# Patient Record
Sex: Male | Born: 1975 | Race: Black or African American | Hispanic: Yes | Marital: Single | Smoking: Former smoker
Health system: Southern US, Community
[De-identification: ages and names within clinical notes are randomized; demographics above are authoritative.]

## PROBLEM LIST (undated history)

## (undated) DIAGNOSIS — I10 Essential (primary) hypertension: Secondary | ICD-10-CM

## (undated) DIAGNOSIS — J189 Pneumonia, unspecified organism: Secondary | ICD-10-CM

## (undated) DIAGNOSIS — I509 Heart failure, unspecified: Secondary | ICD-10-CM

## (undated) DIAGNOSIS — G4730 Sleep apnea, unspecified: Secondary | ICD-10-CM

---

## 2013-07-28 ENCOUNTER — Ambulatory Visit (INDEPENDENT_AMBULATORY_CARE_PROVIDER_SITE_OTHER): Payer: BLUE CROSS/BLUE SHIELD | Admitting: Family Medicine

## 2013-07-28 VITALS — BP 132/88 | HR 67 | Temp 97.8°F | Resp 16 | Ht 69.0 in | Wt 236.0 lb

## 2013-07-28 DIAGNOSIS — M545 Low back pain, unspecified: Secondary | ICD-10-CM

## 2013-07-28 MED ORDER — PREDNISONE 10 MG PO TABS: ORAL_TABLET | ORAL | Status: DC

## 2013-07-28 MED ORDER — TRAMADOL HCL 50 MG PO TABS: 50.0000 mg | ORAL_TABLET | Freq: Three times a day (TID) | ORAL | Status: DC | PRN

## 2013-07-28 MED ORDER — CARISOPRODOL 350 MG PO TABS: 350.0000 mg | ORAL_TABLET | Freq: Four times a day (QID) | ORAL | Status: DC | PRN

## 2013-07-28 NOTE — Progress Notes (Signed)
This chart was scribed for Mark SorensonEva Shaw MD by Tana ConchStephen Methvin, ED Scribe. This patient was seen in room 01 and the patient's care was started at 2:45 PM .  Subjective:    Patient ID: Mark Hayden, male    DOB: 1976/02/21, 38 y.o.   MRN: [id number]    Chief Complaint  Patient presents with   Back Pain    x 3 days ago pt was lifting boxes and hurt back    HPI  HPI Comments: Mark Hayden is a 38 y.o. male with h/o lower back pain who presents to the [facility] complaining of lower back pain that began Saturday morning while he was carrying some boxes and that the pain is more on the left. He states that the pain does not radiate and that it prevents him from standing straight. He reports that he cannot find a comfortable position, his back hurts when he sits, stands, and when he lays down. He reports that the pain in his back prevent him from his normal activities and getting up from a seated position. He denies any weakness, numbness, chills, and incontinence. Pt states that NSAIDS and tylenol provide no effect.  Pt has had similar symptoms before and has been to physical therapy for his back in the past, the exercises he learned from physical therapy have not provided effect with this most current episode of back pain.   He reports not having lower back XRAYS before.    History reviewed. No pertinent past medical history.    No current outpatient prescriptions on file prior to visit.   No current facility-administered medications on file prior to visit.      No Known Allergies      Review of Systems  Constitutional: Positive for activity change.  HENT: Negative for nosebleeds, postnasal drip, rhinorrhea and sinus pressure.   Cardiovascular: Negative for chest pain.  Gastrointestinal: Negative for abdominal pain.  Genitourinary: Negative for dysuria, decreased urine volume and difficulty urinating.  Musculoskeletal: Positive for  arthralgias, back pain and gait problem. Negative for joint swelling.  Psychiatric/Behavioral: Negative for confusion.       Objective:   Physical Exam  Nursing note and vitals reviewed. Constitutional: He is oriented to person, place, and time. He appears well-developed and well-nourished.  HENT:  Head: Normocephalic and atraumatic.  Eyes: Conjunctivae and EOM are normal. No scleral icterus.  Neck: Normal range of motion. Neck supple. No thyromegaly present.  Cardiovascular: Normal rate and regular rhythm.  Exam reveals no gallop and no friction rub.   No murmur heard. Pulmonary/Chest: Effort normal. No stridor. He has no wheezes. He has no rales. He exhibits no tenderness.  Abdominal: He exhibits no distension. There is no tenderness. There is no rebound.  Musculoskeletal: Normal range of motion. He exhibits no edema.  Pain over L-5, left SI joint, lower lumbar, and upper sacrum  Forward flexion about 40 degrees, no extension    Lymphadenopathy:    He has no cervical adenopathy.  Neurological: He is oriented to person, place, and time. He exhibits normal muscle tone. Coordination normal.  2+ bilaterally on patellar and achilles  Negative straight leg raise bilaterally  Hip flexors 4/5 bilaterally due to pain   All over lower extremities test 5/5 bilaterally   Skin: No rash noted. No erythema.  Psychiatric: He has a normal mood and affect. His behavior is normal.    Filed Vitals:   07/28/13 1427  BP: 132/88  Pulse: 67  Temp: 97.8 F (36.6 C)  Resp: 16         Assessment & Plan:    3:02 PM-Discussed treatment plan which includes rest, pain medication, muscle relaxers, a 3 day work note, and for the pt to come back in a few days if the pain does not improve with pt at bedside and pt agreed to plan.  Lumbago  Meds ordered this encounter  Medications   DISCONTD: cyclobenzaprine (FLEXERIL) 5 MG tablet    Sig: Take 5 mg by mouth at bedtime as needed for muscle  spasms.   predniSONE (DELTASONE) 10 MG tablet    Sig: 6-5-4-3-2-1 po tabs qd x 6d taper    Dispense:  21 tablet    Refill:  0   carisoprodol (SOMA) 350 MG tablet    Sig: Take 1 tablet (350 mg total) by mouth 4 (four) times daily as needed for muscle spasms.    Dispense:  30 tablet    Refill:  0   traMADol (ULTRAM) 50 MG tablet    Sig: Take 1 tablet (50 mg total) by mouth every 8 (eight) hours as needed for severe pain.    Dispense:  20 tablet    Refill:  0    I personally performed the services described in this documentation, which was scribed in my presence. The recorded information has been reviewed and considered, and addended by me as needed.  Mark SorensonEva Shaw, MD MPH

## 2013-07-28 NOTE — Patient Instructions (Addendum)
Start taking ibuprofen AFTER the prednisone is complete. I recommend keeping ibuprofen on board - esp taking before and during work. Then when you come home, take a muscle relaxant followed by 15 minutes of heat followed by gentle stretching. Try to do this regiment 2 - 3 times a day.  If you are still having pain in 4 to 6 wks, come back to clinic for further eval. RTC immed if symptoms worsen or you develop any other concerning symptoms below.  Low Back Strain with Rehab A strain is an injury in which a tendon or muscle is torn. The muscles and tendons of the lower back are vulnerable to strains. However, these muscles and tendons are very strong and require a great force to be injured. Strains are classified into three categories. Grade 1 strains cause pain, but the tendon is not lengthened. Grade 2 strains include a lengthened ligament, due to the ligament being stretched or partially ruptured. With grade 2 strains there is still function, although the function may be decreased. Grade 3 strains involve a complete tear of the tendon or muscle, and function is usually impaired. SYMPTOMS   Pain in the lower back.  Pain that affects one side more than the other.  Pain that gets worse with movement and may be felt in the hip, buttocks, or back of the thigh.  Muscle spasms of the muscles in the back.  Swelling along the muscles of the back.  Loss of strength of the back muscles.  Crackling sound (crepitation) when the muscles are touched. CAUSES  Lower back strains occur when a force is placed on the muscles or tendons that is greater than they can handle. Common causes of injury include:  Prolonged overuse of the muscle-tendon units in the lower back, usually from incorrect posture.  A single violent injury or force applied to the back. RISK INCREASES WITH:  Sports that involve twisting forces on the spine or a lot of bending at the waist (football, rugby, weightlifting, bowling, golf,  tennis, speed skating, racquetball, swimming, running, gymnastics, diving).  Poor strength and flexibility.  Failure to warm up properly before activity.  Family history of lower back pain or disk disorders.  Previous back injury or surgery (especially fusion).  Poor posture with lifting, especially heavy objects.  Prolonged sitting, especially with poor posture. PREVENTION   Learn and use proper posture when sitting or lifting (maintain proper posture when sitting, lift using the knees and legs, not at the waist).  Warm up and stretch properly before activity.  Allow for adequate recovery between workouts.  Maintain physical fitness:  Strength, flexibility, and endurance.  Cardiovascular fitness. PROGNOSIS  If treated properly, lower back strains usually heal within 6 weeks. RELATED COMPLICATIONS   Recurring symptoms, resulting in a chronic problem.  Chronic inflammation, scarring, and partial muscle-tendon tear.  Delayed healing or resolution of symptoms.  Prolonged disability. TREATMENT  Treatment first involves the use of ice and medicine, to reduce pain and inflammation. The use of strengthening and stretching exercises may help reduce pain with activity. These exercises may be performed at home or with a therapist. Severe injuries may require referral to a therapist for further evaluation and treatment, such as ultrasound. Your caregiver may advise that you wear a back brace or corset, to help reduce pain and discomfort. Often, prolonged bed rest results in greater harm then benefit. Corticosteroid injections may be recommended. However, these should be reserved for the most serious cases. It is important to avoid  using your back when lifting objects. At night, sleep on your back on a firm mattress with a pillow placed under your knees. If non-surgical treatment is unsuccessful, surgery may be needed.  MEDICATION   If pain medicine is needed, nonsteroidal  anti-inflammatory medicines (aspirin and ibuprofen), or other minor pain relievers (acetaminophen), are often advised.  Do not take pain medicine for 7 days before surgery.  Prescription pain relievers may be given, if your caregiver thinks they are needed. Use only as directed and only as much as you need.  Ointments applied to the skin may be helpful.  Corticosteroid injections may be given by your caregiver. These injections should be reserved for the most serious cases, because they may only be given a certain number of times. HEAT AND COLD  Cold treatment (icing) should be applied for 10 to 15 minutes every 2 to 3 hours for inflammation and pain, and immediately after activity that aggravates your symptoms. Use ice packs or an ice massage.  Heat treatment may be used before performing stretching and strengthening activities prescribed by your caregiver, physical therapist, or athletic trainer. Use a heat pack or a warm water soak. SEEK MEDICAL CARE IF:   Symptoms get worse or do not improve in 2 to 4 weeks, despite treatment.  You develop numbness, weakness, or loss of bowel or bladder function.  New, unexplained symptoms develop. (Drugs used in treatment may produce side effects.) EXERCISES  RANGE OF MOTION (ROM) AND STRETCHING EXERCISES - Low Back Strain Most people with lower back pain will find that their symptoms get worse with excessive bending forward (flexion) or arching at the lower back (extension). The exercises which will help resolve your symptoms will focus on the opposite motion.  Your physician, physical therapist or athletic trainer will help you determine which exercises will be most helpful to resolve your lower back pain. Do not complete any exercises without first consulting with your caregiver. Discontinue any exercises which make your symptoms worse until you speak to your caregiver.  If you have pain, numbness or tingling which travels down into your buttocks,  leg or foot, the goal of the therapy is for these symptoms to move closer to your back and eventually resolve. Sometimes, these leg symptoms will get better, but your lower back pain may worsen. This is typically an indication of progress in your rehabilitation. Be very alert to any changes in your symptoms and the activities in which you participated in the 24 hours prior to the change. Sharing this information with your caregiver will allow him/her to most efficiently treat your condition.  These exercises may help you when beginning to rehabilitate your injury. Your symptoms may resolve with or without further involvement from your physician, physical therapist or athletic trainer. While completing these exercises, remember:  Restoring tissue flexibility helps normal motion to return to the joints. This allows healthier, less painful movement and activity.  An effective stretch should be held for at least 30 seconds.  A stretch should never be painful. You should only feel a gentle lengthening or release in the stretched tissue. FLEXION RANGE OF MOTION AND STRETCHING EXERCISES: STRETCH  Flexion, Single Knee to Chest   Lie on a firm bed or floor with both legs extended in front of you.  Keeping one leg in contact with the floor, bring your opposite knee to your chest. Hold your leg in place by either grabbing behind your thigh or at your knee.  Pull until you feel a  gentle stretch in your lower back. Hold __________ seconds.  Slowly release your grasp and repeat the exercise with the opposite side. Repeat __________ times. Complete this exercise __________ times per day.  STRETCH  Flexion, Double Knee to Chest   Lie on a firm bed or floor with both legs extended in front of you.  Keeping one leg in contact with the floor, bring your opposite knee to your chest.  Tense your stomach muscles to support your back and then lift your other knee to your chest. Hold your legs in place by either  grabbing behind your thighs or at your knees.  Pull both knees toward your chest until you feel a gentle stretch in your lower back. Hold __________ seconds.  Tense your stomach muscles and slowly return one leg at a time to the floor. Repeat __________ times. Complete this exercise __________ times per day.  STRETCH  Low Trunk Rotation  Lie on a firm bed or floor. Keeping your legs in front of you, bend your knees so they are both pointed toward the ceiling and your feet are flat on the floor.  Extend your arms out to the side. This will stabilize your upper body by keeping your shoulders in contact with the floor.  Gently and slowly drop both knees together to one side until you feel a gentle stretch in your lower back. Hold for __________ seconds.  Tense your stomach muscles to support your lower back as you bring your knees back to the starting position. Repeat the exercise to the other side. Repeat __________ times. Complete this exercise __________ times per day  EXTENSION RANGE OF MOTION AND FLEXIBILITY EXERCISES: STRETCH  Extension, Prone on Elbows   Lie on your stomach on the floor, a bed will be too soft. Place your palms about shoulder width apart and at the height of your head.  Place your elbows under your shoulders. If this is too painful, stack pillows under your chest.  Allow your body to relax so that your hips drop lower and make contact more completely with the floor.  Hold this position for __________ seconds.  Slowly return to lying flat on the floor. Repeat __________ times. Complete this exercise __________ times per day.  RANGE OF MOTION  Extension, Prone Press Ups  Lie on your stomach on the floor, a bed will be too soft. Place your palms about shoulder width apart and at the height of your head.  Keeping your back as relaxed as possible, slowly straighten your elbows while keeping your hips on the floor. You may adjust the placement of your hands to maximize  your comfort. As you gain motion, your hands will come more underneath your shoulders.  Hold this position __________ seconds.  Slowly return to lying flat on the floor. Repeat __________ times. Complete this exercise __________ times per day.  RANGE OF MOTION- Quadruped, Neutral Spine   Assume a hands and knees position on a firm surface. Keep your hands under your shoulders and your knees under your hips. You may place padding under your knees for comfort.  Drop your head and point your tail bone toward the ground below you. This will round out your lower back like an angry cat. Hold this position for __________ seconds.  Slowly lift your head and release your tail bone so that your back sags into a large arch, like an old horse.  Hold this position for __________ seconds.  Repeat this until you feel limber in your  lower back.  Now, find your "sweet spot." This will be the most comfortable position somewhere between the two previous positions. This is your neutral spine. Once you have found this position, tense your stomach muscles to support your lower back.  Hold this position for __________ seconds. Repeat __________ times. Complete this exercise __________ times per day.  STRENGTHENING EXERCISES - Low Back Strain These exercises may help you when beginning to rehabilitate your injury. These exercises should be done near your "sweet spot." This is the neutral, low-back arch, somewhere between fully rounded and fully arched, that is your least painful position. When performed in this safe range of motion, these exercises can be used for people who have either a flexion or extension based injury. These exercises may resolve your symptoms with or without further involvement from your physician, physical therapist or athletic trainer. While completing these exercises, remember:   Muscles can gain both the endurance and the strength needed for everyday activities through controlled  exercises.  Complete these exercises as instructed by your physician, physical therapist or athletic trainer. Increase the resistance and repetitions only as guided.  You may experience muscle soreness or fatigue, but the pain or discomfort you are trying to eliminate should never worsen during these exercises. If this pain does worsen, stop and make certain you are following the directions exactly. If the pain is still present after adjustments, discontinue the exercise until you can discuss the trouble with your caregiver. STRENGTHENING Deep Abdominals, Pelvic Tilt  Lie on a firm bed or floor. Keeping your legs in front of you, bend your knees so they are both pointed toward the ceiling and your feet are flat on the floor.  Tense your lower abdominal muscles to press your lower back into the floor. This motion will rotate your pelvis so that your tail bone is scooping upwards rather than pointing at your feet or into the floor.  With a gentle tension and even breathing, hold this position for __________ seconds. Repeat __________ times. Complete this exercise __________ times per day.  STRENGTHENING  Abdominals, Crunches   Lie on a firm bed or floor. Keeping your legs in front of you, bend your knees so they are both pointed toward the ceiling and your feet are flat on the floor. Cross your arms over your chest.  Slightly tip your chin down without bending your neck.  Tense your abdominals and slowly lift your trunk high enough to just clear your shoulder blades. Lifting higher can put excessive stress on the lower back and does not further strengthen your abdominal muscles.  Control your return to the starting position. Repeat __________ times. Complete this exercise __________ times per day.  STRENGTHENING  Quadruped, Opposite UE/LE Lift   Assume a hands and knees position on a firm surface. Keep your hands under your shoulders and your knees under your hips. You may place padding under  your knees for comfort.  Find your neutral spine and gently tense your abdominal muscles so that you can maintain this position. Your shoulders and hips should form a rectangle that is parallel with the floor and is not twisted.  Keeping your trunk steady, lift your right hand no higher than your shoulder and then your left leg no higher than your hip. Make sure you are not holding your breath. Hold this position __________ seconds.  Continuing to keep your abdominal muscles tense and your back steady, slowly return to your starting position. Repeat with the opposite arm and  leg. Repeat __________ times. Complete this exercise __________ times per day.  STRENGTHENING  Lower Abdominals, Double Knee Lift  Lie on a firm bed or floor. Keeping your legs in front of you, bend your knees so they are both pointed toward the ceiling and your feet are flat on the floor.  Tense your abdominal muscles to brace your lower back and slowly lift both of your knees until they come over your hips. Be certain not to hold your breath.  Hold __________ seconds. Using your abdominal muscles, return to the starting position in a slow and controlled manner. Repeat __________ times. Complete this exercise __________ times per day.  POSTURE AND BODY MECHANICS CONSIDERATIONS - Low Back Strain Keeping correct posture when sitting, standing or completing your activities will reduce the stress put on different body tissues, allowing injured tissues a chance to heal and limiting painful experiences. The following are general guidelines for improved posture. Your physician or physical therapist will provide you with any instructions specific to your needs. While reading these guidelines, remember:  The exercises prescribed by your provider will help you have the flexibility and strength to maintain correct postures.  The correct posture provides the best environment for your joints to work. All of your joints have less wear  and tear when properly supported by a spine with good posture. This means you will experience a healthier, less painful body.  Correct posture must be practiced with all of your activities, especially prolonged sitting and standing. Correct posture is as important when doing repetitive low-stress activities (typing) as it is when doing a single heavy-load activity (lifting). RESTING POSITIONS Consider which positions are most painful for you when choosing a resting position. If you have pain with flexion-based activities (sitting, bending, stooping, squatting), choose a position that allows you to rest in a less flexed posture. You would want to avoid curling into a fetal position on your side. If your pain worsens with extension-based activities (prolonged standing, working overhead), avoid resting in an extended position such as sleeping on your stomach. Most people will find more comfort when they rest with their spine in a more neutral position, neither too rounded nor too arched. Lying on a non-sagging bed on your side with a pillow between your knees, or on your back with a pillow under your knees will often provide some relief. Keep in mind, being in any one position for a prolonged period of time, no matter how correct your posture, can still lead to stiffness. PROPER SITTING POSTURE In order to minimize stress and discomfort on your spine, you must sit with correct posture. Sitting with good posture should be effortless for a healthy body. Returning to good posture is a gradual process. Many people can work toward this most comfortably by using various supports until they have the flexibility and strength to maintain this posture on their own. When sitting with proper posture, your ears will fall over your shoulders and your shoulders will fall over your hips. You should use the back of the chair to support your upper back. Your lower back will be in a neutral position, just slightly arched. You may  place a small pillow or folded towel at the base of your lower back for support.  When working at a desk, create an environment that supports good, upright posture. Without extra support, muscles tire, which leads to excessive strain on joints and other tissues. Keep these recommendations in mind: CHAIR:  A chair should be able to  slide under your desk when your back makes contact with the back of the chair. This allows you to work closely.  The chair's height should allow your eyes to be level with the upper part of your monitor and your hands to be slightly lower than your elbows. BODY POSITION  Your feet should make contact with the floor. If this is not possible, use a foot rest.  Keep your ears over your shoulders. This will reduce stress on your neck and lower back. INCORRECT SITTING POSTURES  If you are feeling tired and unable to assume a healthy sitting posture, do not slouch or slump. This puts excessive strain on your back tissues, causing more damage and pain. Healthier options include:  Using more support, like a lumbar pillow.  Switching tasks to something that requires you to be upright or walking.  Talking a brief walk.  Lying down to rest in a neutral-spine position. PROLONGED STANDING WHILE SLIGHTLY LEANING FORWARD  When completing a task that requires you to lean forward while standing in one place for a long time, place either foot up on a stationary 2-4 inch high object to help maintain the best posture. When both feet are on the ground, the lower back tends to lose its slight inward curve. If this curve flattens (or becomes too large), then the back and your other joints will experience too much stress, tire more quickly, and can cause pain. CORRECT STANDING POSTURES Proper standing posture should be assumed with all daily activities, even if they only take a few moments, like when brushing your teeth. As in sitting, your ears should fall over your shoulders and your  shoulders should fall over your hips. You should keep a slight tension in your abdominal muscles to brace your spine. Your tailbone should point down to the ground, not behind your body, resulting in an over-extended swayback posture.  INCORRECT STANDING POSTURES  Common incorrect standing postures include a forward head, locked knees and/or an excessive swayback. WALKING Walk with an upright posture. Your ears, shoulders and hips should all line-up. PROLONGED ACTIVITY IN A FLEXED POSITION When completing a task that requires you to bend forward at your waist or lean over a low surface, try to find a way to stabilize 3 out of 4 of your limbs. You can place a hand or elbow on your thigh or rest a knee on the surface you are reaching across. This will provide you more stability so that your muscles do not fatigue as quickly. By keeping your knees relaxed, or slightly bent, you will also reduce stress across your lower back. CORRECT LIFTING TECHNIQUES DO :   Assume a wide stance. This will provide you more stability and the opportunity to get as close as possible to the object which you are lifting.  Tense your abdominals to brace your spine. Bend at the knees and hips. Keeping your back locked in a neutral-spine position, lift using your leg muscles. Lift with your legs, keeping your back straight.  Test the weight of unknown objects before attempting to lift them.  Try to keep your elbows locked down at your sides in order get the best strength from your shoulders when carrying an object.  Always ask for help when lifting heavy or awkward objects. INCORRECT LIFTING TECHNIQUES DO NOT:   Lock your knees when lifting, even if it is a small object.  Bend and twist. Pivot at your feet or move your feet when needing to change directions.  Assume that you can safely pick up even a paper clip without proper posture. Document Released: 03/05/2005 Document Revised: 05/28/2011 Document Reviewed:  06/17/2008 [facility] Patient Information 2014 [place], [place].

## 2013-09-22 ENCOUNTER — Emergency Department (HOSPITAL_COMMUNITY): Payer: BC Managed Care – PPO

## 2013-09-22 ENCOUNTER — Encounter (HOSPITAL_COMMUNITY): Payer: Self-pay | Admitting: Emergency Medicine

## 2013-09-22 ENCOUNTER — Emergency Department (HOSPITAL_COMMUNITY)
Admission: EM | Admit: 2013-09-22 | Discharge: 2013-09-22 | Disposition: A | Discharge status: Home / Self Care | Payer: BC Managed Care – PPO | Attending: Emergency Medicine | Admitting: Emergency Medicine

## 2013-09-22 DIAGNOSIS — R0789 Other chest pain: Secondary | ICD-10-CM

## 2013-09-22 DIAGNOSIS — R42 Dizziness and giddiness: Secondary | ICD-10-CM | POA: Insufficient documentation

## 2013-09-22 DIAGNOSIS — R209 Unspecified disturbances of skin sensation: Secondary | ICD-10-CM | POA: Insufficient documentation

## 2013-09-22 DIAGNOSIS — R071 Chest pain on breathing: Secondary | ICD-10-CM | POA: Insufficient documentation

## 2013-09-22 DIAGNOSIS — Z87891 Personal history of nicotine dependence: Secondary | ICD-10-CM | POA: Insufficient documentation

## 2013-09-22 LAB — I-STAT TROPONIN, ED: Troponin i, poc: 0 ng/mL (ref 0.00–0.08)

## 2013-09-22 LAB — CBC
HCT: 39.6 % (ref 39.0–52.0)
Hemoglobin: 13.5 g/dL (ref 13.0–17.0)
MCH: 31.2 pg (ref 26.0–34.0)
MCHC: 34.1 g/dL (ref 30.0–36.0)
MCV: 91.5 fL (ref 78.0–100.0)
Platelets: 246 10*3/uL (ref 150–400)
RBC: 4.33 MIL/uL (ref 4.22–5.81)
RDW: 13.1 % (ref 11.5–15.5)
WBC: 5.9 10*3/uL (ref 4.0–10.5)

## 2013-09-22 LAB — BASIC METABOLIC PANEL
Anion gap: 11 (ref 5–15)
BUN: 10 mg/dL (ref 6–23)
CO2: 26 mEq/L (ref 19–32)
CREATININE: 0.97 mg/dL (ref 0.50–1.35)
Calcium: 8.9 mg/dL (ref 8.4–10.5)
Chloride: 105 mEq/L (ref 96–112)
Glucose, Bld: 90 mg/dL (ref 70–99)
Potassium: 3.9 mEq/L (ref 3.7–5.3)
Sodium: 142 mEq/L (ref 137–147)

## 2013-09-22 MED ORDER — MELOXICAM 7.5 MG PO TABS: 7.5000 mg | ORAL_TABLET | Freq: Every day | ORAL | Status: DC

## 2013-09-22 NOTE — ED Provider Notes (Signed)
Medical screening examination/treatment/procedure(s) were performed by non-physician practitioner and as supervising physician I was immediately available for consultation/collaboration.    Nelia Shiobert L Lakeva Hollon, MD 09/22/13 1950

## 2013-09-22 NOTE — ED Notes (Signed)
Pt c/o sudden onset of stabbing reproducible cp since 6am. Pt states pain remained and increased over the day while at work. Pt called ems when he googled symptoms and began to have dizziness decided to call 911. Pt given nitro x2 en route. Asa x4 denies complaints at this time.

## 2013-09-22 NOTE — ED Notes (Signed)
PT arrived via EMS. Pt placed in bed and in gown. Pt monitored by 12-lead, pulse ox, and bp cuff. EKG taken and given to Dr. Radford PaxBeaton.

## 2013-09-22 NOTE — ED Notes (Signed)
PA Courtney and Phlebotomy at the bedside.

## 2013-09-22 NOTE — ED Notes (Signed)
Xray at the bedside.

## 2013-09-22 NOTE — ED Provider Notes (Signed)
CSN: [id number]     Arrival date & time 09/22/13  1654 History   First MD Initiated Contact with Patient 09/22/13 1719     Chief Complaint  Patient presents with  . Chest Pain   Patient is a 38 y.o. male presenting with chest pain. The history is provided by the patient. No language interpreter was used.  Chest Pain Pain location:  Substernal area Pain quality: aching and sharp   Pain radiates to:  Does not radiate Pain radiates to the back: no   Pain severity:  Severe Onset quality:  Sudden (This morning upon waking) Duration:  1 day Timing:  Constant Progression since onset: improved after two NTG en route via EMS. Chronicity:  New Context: breathing, lifting, movement and at rest   Context: no drug use, not eating, no intercourse, not raising an arm, no stress and no trauma   Relieved by:  Nitroglycerin Worsened by:  Nothing tried Ineffective treatments:  Rest and certain positions Associated symptoms: dizziness and numbness   Associated symptoms: no abdominal pain, no altered mental status, no anorexia, no anxiety, no back pain, no claudication, no cough, no diaphoresis, no dysphagia, no fatigue, no fever, no headache, no heartburn, no lower extremity edema, no nausea, no near-syncope, no orthopnea, no palpitations, no PND, no shortness of breath, no syncope and not vomiting   Associated symptoms comment:  Dizziness and left arm numbness began after he looked up symptoms for chest pain on google Risk factors: male sex   Risk factors: no aortic disease, no birth control, no coronary artery disease, no diabetes mellitus, no Ehlers-Danlos syndrome, no high cholesterol, no hypertension, no immobilization, no Marfan's syndrome, not obese, not pregnant, no prior DVT/PE, no smoking and no surgery     History reviewed. No pertinent past medical history. History reviewed. No pertinent past surgical history. Family History  Problem Relation Age of Onset  . Diabetes Mother   . Diabetes  Father    History  Substance Use Topics  . Smoking status: Former Games developermoker  . Smokeless tobacco: Not on file  . Alcohol Use: No    Review of Systems  Constitutional: Negative for fever, diaphoresis and fatigue.  HENT: Negative for trouble swallowing.   Respiratory: Negative for cough and shortness of breath.   Cardiovascular: Positive for chest pain. Negative for palpitations, orthopnea, claudication, syncope, PND and near-syncope.  Gastrointestinal: Negative for heartburn, nausea, vomiting, abdominal pain and anorexia.  Musculoskeletal: Negative for back pain.  Neurological: Positive for dizziness and numbness. Negative for headaches.  All other systems reviewed and are negative.     Allergies  Shellfish allergy  Home Medications   Prior to Admission medications   Medication Sig Start Date End Date Taking? Authorizing Provider  meloxicam (MOBIC) 7.5 MG tablet Take 1 tablet (7.5 mg total) by mouth daily. 09/22/13   Alvin Diffee A Forcucci, PA-C   BP 146/88  Pulse 80  Temp(Src) 98.3 F (36.8 C) (Oral)  Resp 13  SpO2 100% Physical Exam  Nursing note and vitals reviewed. Constitutional: He is oriented to person, place, and time. He appears well-developed and well-nourished. No distress.  HENT:  Head: Normocephalic and atraumatic.  Mouth/Throat: Oropharynx is clear and moist. No oropharyngeal exudate.  Eyes: Conjunctivae and EOM are normal. Pupils are equal, round, and reactive to light. No scleral icterus.  Neck: Normal range of motion. Neck supple. No JVD present. No thyromegaly present.  Cardiovascular: Normal rate, regular rhythm, normal heart sounds and intact distal pulses.  Exam  reveals no gallop and no friction rub.   No murmur heard. Pulmonary/Chest: Effort normal and breath sounds normal. No respiratory distress. He has no wheezes. He has no rales. He exhibits no tenderness.  Abdominal: Soft. Bowel sounds are normal. He exhibits no distension and no mass. There is no  tenderness. There is no rebound and no guarding.  Musculoskeletal: Normal range of motion.  Lymphadenopathy:    He has no cervical adenopathy.  Neurological: He is alert and oriented to person, place, and time. He has normal strength. No cranial nerve deficit or sensory deficit. Coordination normal.  Skin: Skin is warm and dry. He is not diaphoretic.  Psychiatric: He has a normal mood and affect. His behavior is normal. Judgment and thought content normal.    ED Course  Procedures (including critical care time) Labs Review Labs Reviewed  CBC  BASIC METABOLIC PANEL  Rosezena SensorI-STAT TROPOININ, ED    Imaging Review Dg Chest Port 1 View  09/22/2013   CLINICAL DATA:  Chest pain.  Quit smoking 5 months ago.  EXAM: PORTABLE CHEST - 1 VIEW  COMPARISON:  None.  FINDINGS: Slightly poor inspiration.  No infiltrate, congestive heart failure or pneumothorax.  Heart size within normal limits.  No plain film evidence of pulmonary malignancy.  The patient would eventually benefit from two view chest with cardiac leads removed.  IMPRESSION: No acute abnormality noted on this portable exam.  Please see above.   Electronically Signed   By: Bridgett LarssonSteve  Olson M.D.   On: 09/22/2013 17:46     EKG Interpretation   Date/Time:  Tuesday September 22 2013 17:03:31 EDT Ventricular Rate:  80 PR Interval:  169 QRS Duration: 85 QT Interval:  357 QTC Calculation: 412 R Axis:   39 Text Interpretation:  Sinus rhythm Probable left atrial enlargement  Borderline ST elevation, anterior leads Borderline ECG Confirmed by BEATON   MD, ROBERT ([id number]) on 09/22/2013 7:16:32 PM      MDM   Final diagnoses:  Chest wall pain   Patient presents to the ED for chest pain x 1 day.  CBC, BMP, troponin, and CXR are unremarkable.  Patients pain is reproducible on palpation.  He is PERC negative and has a HART score of 2 and is very low risk.  Patient is stable for discharge at this time.  I will prescribe Mobic 7.5 mg PO QD x 14 days.  I have  instructed him to follow-up with a PCP of his choosing from the resource list.  Patient was told to return for ACS symptoms or worsening shortness of breath.  He was told to take his medication with food, and to avoid all other NSAIDs.  He was given a work note to prevent heavy lifting over 25 lbs for the next week.  He states understanding and agreement to the above plan.      Eben Burowourtney A Forcucci, PA-C 09/22/13 1940

## 2013-09-22 NOTE — Discharge Instructions (Signed)
Chest Wall Pain °Chest wall pain is pain felt in or around the chest bones and muscles. It may take up to 6 weeks to get better. It may take longer if you are active. Chest wall pain can happen on its own. Other times, things like germs, injury, coughing, or exercise can cause the pain. °HOME CARE  °· Avoid activities that make you tired or cause pain. Try not to use your chest, belly (abdominal), or side muscles. Do not use heavy weights. °· Put ice on the sore area. °¨ Put ice in a plastic bag. °¨ Place a towel between your skin and the bag. °¨ Leave the ice on for 15-20 minutes for the first 2 days. °· Only take medicine as told by your doctor. °GET HELP RIGHT AWAY IF:  °· You have more pain or are very uncomfortable. °· You have a fever. °· Your chest pain gets worse. °· You have new problems. °· You feel sick to your stomach (nauseous) or throw up (vomit). °· You start to sweat or feel lightheaded. °· You have a cough with mucus (phlegm). °· You cough up blood. °MAKE SURE YOU:  °· Understand these instructions. °· Will watch your condition. °· Will get help right away if you are not doing well or get worse. °Document Released: 08/22/2007 Document Revised: 05/28/2011 Document Reviewed: 10/30/2010 °ExitCare® Patient Information ©2015 [employer]. This information is not intended to replace advice given to you by your health care provider. Make sure you discuss any questions you have with your health care provider. ° ° °Emergency Department Resource Guide °1) Find a Doctor and Pay Out of Pocket °Although you won't have to find out who is covered by your insurance plan, it is a good idea to ask around and get recommendations. You will then need to call the office and see if the doctor you have chosen will accept you as a new patient and what types of options they offer for patients who are self-pay. Some doctors offer discounts or will set up payment plans for their patients who do not have insurance, but you  will need to ask so you aren't surprised when you get to your appointment. ° °2) Contact Your Local Health Department °Not all health departments have doctors that can see patients for sick visits, but many do, so it is worth a call to see if yours does. If you don't know where your local health department is, you can check in your phone book. The CDC also has a tool to help you locate your state's health department, and many state websites also have listings of all of their local health departments. ° °3) Find a Walk-in Clinic °If your illness is not likely to be very severe or complicated, you may want to try a walk in clinic. These are popping up all over the country in pharmacies, drugstores, and shopping centers. They're usually staffed by nurse practitioners or physician assistants that have been trained to treat common illnesses and complaints. They're usually fairly quick and inexpensive. However, if you have serious medical issues or chronic medical problems, these are probably not your best option. ° °No Primary Care Doctor: °- Call [facility] Connect at  832-8000 - they can help you locate a primary care doctor that  accepts your insurance, provides certain services, etc. °- Physician Referral Service- 1-800-533-3463 ° °Chronic Pain Problems: °Organization         Address  Phone   Notes  °[facility]  (336) 297-2271 Patients need to be referred by their primary care doctor.  ° °Medication Assistance: °Organization         Address  Phone   Notes  °[place] [facility] [address] °[place], [place] [address] (336) 641-8030 --Must be a resident of [place] °-- Must have NO insurance coverage whatsoever (no Medicaid/ Medicare, etc.) °-- The pt. MUST have a primary care doctor that directs their care regularly and follows them in the community °  °[facility]  (866) 331-1348   °[employer]  (888) 892-1162   ° °Agencies that provide inexpensive medical  care: °Organization         Address  Phone   Notes  °[facility]  (336) 832-8035   °[facility]    (336) 832-7272   °[facility] [address] °[place], [place] [address] (336) 832-4777   °[facility] [address], °[place] (336) 271-4999   °[facility]    (336) 373-0678   °[facility]    (336) 272-1050   °[facility] ° [address], [place] Phone:  (336) 832-4444, Fax:  (336) 832-4440 Hours of Operation:  9 am - 6 pm, M-F.  Also accepts Medicaid/Medicare and self-pay.  °[facility] ° [address], [place] Phone: (336) 832-3150, Fax: (336) 832-3151. Hours of Operation:  8:30 am - 5:30 pm, M-F.  Also accepts Medicaid and self-pay.  °[facility] [place] [address], [place] Phone: (336) 878-6027   °[facility] [address], [place], [place] (336)723-1848, Ext. 123 Mondays & Thursdays: 7-9 AM.  First 15 patients are seen on a first come, first serve basis. °  ° °Medicaid-accepting [place] Providers: ° °Organization         Address  Phone   Notes  °[facility] [address], [place] (336) 641-2100 Also accepts self-pay patients.  °[facility] [address], [place] ° (336) 856-9996   °[facility] [address], [place] (336) 288-8857   °[facility] [address] [place] [address], [place] (336) 299-7000   °Veita Bland [address], [place]  ° (336) 373-1557 Only accepts  Access Medicaid patients after they have their name applied to their card.  ° °Self-Pay (no insurance) in [place]: ° °Organization         Address  Phone   Notes  °Sickle Cell Patients, [facility] [address], [place] (336) 832-1970   °[facility] [address],  [place] (336) 832-4400   °[facility] ° [address], [place] (336) 992-4800   °[facility]/Dr. Osei-Bonsu ° [address] [place] [address], [place] or [address], [place] (336) 841-8500 Phone number for both [place] and [place] locations is the same.  °[facility] [address], [place] (336) 299-0000   °[facility] [address] [place] [address], [place] or [address] (336) 852-7530 °(336) 878-2260   °[facility] [address],  (336) 350-1642, phone; (336) 294-5005, fax Sees patients 1st and 3rd Saturday of every month.  Must not qualify for public or private insurance (i.e. Medicaid, Medicare, [place] Health Choice,   Veterans' Benefits) • Household income should be no more than 200% of the poverty level •The clinic cannot treat you if you are pregnant or think you are pregnant • Sexually transmitted diseases are not treated at the clinic.  ° ° °Dental Care: °Organization         Address  Phone  Notes  °[facility] [address], [place] (336) 641-6152 Accepts children up to age 21 who are enrolled in Medicaid or [place] Health Choice; pregnant women with a Medicaid card; and children who have applied for Medicaid or [place] Health Choice, but were declined, whose parents can pay a reduced fee at time of service.  °[facility] [place]  [address], [place] (336) 641-7733 Accepts children up to age 21 who are enrolled in Medicaid or [place] Health Choice; pregnant women with a Medicaid card; and children who have applied for Medicaid or [place] Health Choice, but were declined, whose parents can pay a reduced fee at time of service.  °[facility] ° [address], [place] (336) 641-4533 Patients are seen by appointment only. Walk-ins are not accepted. [facility] will see patients 18 years of age and older. °Monday - Tuesday (8am-5pm) °Most Wednesdays (8:30-5pm) °$30 per visit, cash only  °[facility] ° [address], [place] (336) 641-4533 Patients are seen by appointment only. Walk-ins are not accepted. [facility] will see patients 18 years of age and older. °One Wednesday Evening (Monthly: Volunteer Based).  $30 per visit, cash only  °[facility]  (919) 537-3737 for adults; Children under age 4, call Graduate Pediatric Dentistry at (919) [phone]. Children aged 4-14, please call (919) [phone] to request a pediatric application. ° Dental services are provided in all areas of dental care including fillings, crowns and bridges, complete and partial dentures, implants, gum treatment, root canals, and extractions. Preventive care is also provided. Treatment is provided to both adults and children. °Patients are selected via a lottery and there is often a waiting list. °  °[facility] [address], °[place] ° (336) 763-8833 [email or web address] °  °[facility] [address], [place], [place] (336)723-1848, Ext. 123 Second and Fourth Thursday of each month, opens at 6:30 AM; Clinic ends at 9 AM.  Patients are seen on a first-come first-served basis, and a limited number are seen during each clinic.  ° °[facility] ° [address], [place], [place] (336) 723-7904   Eligibility Requirements °You must have lived in [place], [place], or [place] counties for at least the last three months. °  You cannot be eligible for state or federal sponsored healthcare insurance, including Veterans Administration, Medicaid, or Medicare. °  You generally cannot be eligible for healthcare insurance through your employer.  °  How to apply: °Eligibility screenings are held every Tuesday and Wednesday afternoon from 1:00 pm until 4:00 pm. You do not need an appointment for the interview!   °[facility] [address], [place], [place] 336-631-2330   °[facility]  336-342-8273   °[facility]  336-703-3100   °[facility]  336-570-6415   ° °Behavioral Health Resources in the Community: °Intensive Outpatient Programs °Organization         Address  Phone  Notes  °[facility] [address], [place], [place] 336-878-6098   °[facility] [address], [place], [place] 336-832-9800   °[facility] [address], [place], [place] ° 336-882-2125   °[facility] [address],  °[place], [place] 1-800-853-5163 or 336-641-4981   °Substance Abuse Resources °Organization         Address  Phone  Notes  °[facility]  336-882-2125   °[facility]  336-784-9470   °[facility]  336-285-9073   °[facility]  336-845-3988   °Residential & Outpatient Substance Abuse Program  1-800-659-3381   °Psychological Services °Organization         Address  Phone  Notes  ° Health  336- 832-9600   °[facility]  336- 378-7881   °[facility] [address], [place] 1-800-853-5163 or 336-641-4981   ° °Mobile Crisis Teams °Organization         Address  Phone  Notes  °[facility], Mobile Crisis Care Unit  1-877-626-1772   °Assertive °[facility] ° [address] [place], [place] 336-834-9664   °Sharon DeEsch [address] °[place] 336-554-5454   ° °Self-Help/Support Groups °Organization         Address  Phone             Notes  °[facility] - variety of support groups  336- 373-1402 Call for more information  °Narcotics Anonymous (NA), [facility] [address], °[place]  2 meetings at this location  ° °Residential Treatment Programs °Organization         Address  Phone  Notes  °[facility] [address],    °[place]  1-866-801-8205   °[facility] ° [address], [place], [place] 704-293-8524   °[facility] [address], [place] 336-845-3988 Admissions: 8am-3pm M-F  °[facility] [address]    °[place], [place] 336-841-1104   °[facility] [address], [place], [place] 336-379-7146   °[facility] [address]  °[place], [place] 336-285-9073   °[facility] Intensive Outpatient [address], [place], [place] 336-852-3033   °[facility] [address]  °[place], [place] 1-877-615-2722 or 336-784-9470   °[facility] [address] [place], [place] 336-227-7417 Accepts Medicaid  °[facility] [address]  ° [place] 1-800-659-3381 Substance Abuse/Addiction Treatment  ° °[place] Behavioral Health Resources °Organization         Address  Phone  Notes  °[facility]  (888) 581-9988   °Julie Brannon, PhD [address] [place], [place]   (336) 349-5553 or (336) 951-0000   °[facility]   [address] °[place], [place] (336) 349-4454   °[facility] [address], [place], [place] (336) 342-8316 Insurance/Medicaid/sponsorship through [facility]  °[facility] [address]                                    [place], [place] (336) 342-8316 Therapy/tele-psych/case  °[facility] [address]  ° [place],  (336) 349-2233    °Dr. Arfeen  (336) 349-4544   °[facility] [place]  [employer] [place] [facility] 1) [address], [place] °2)   [address], [place] °3)  [address], [place] (336) 349-3220 °(336) 342-7768 ° °(336) 342-8140   °[place] [facility] (336) 342-1394 or (336) 342-3537 (After Hours)    ° ° ° °

## 2013-09-22 NOTE — ED Notes (Signed)
Pt c/o numbness to left arm that has resolved at this time

## 2013-09-22 NOTE — ED Notes (Signed)
Pt alert x4 resp easy non labored.

## 2013-09-29 ENCOUNTER — Emergency Department (HOSPITAL_COMMUNITY)
Admission: EM | Admit: 2013-09-29 | Discharge: 2013-09-29 | Disposition: A | Discharge status: Home / Self Care | Payer: BC Managed Care – PPO | Attending: Emergency Medicine | Admitting: Emergency Medicine

## 2013-09-29 ENCOUNTER — Emergency Department (HOSPITAL_COMMUNITY): Payer: BC Managed Care – PPO

## 2013-09-29 ENCOUNTER — Encounter (HOSPITAL_COMMUNITY): Payer: Self-pay | Admitting: Emergency Medicine

## 2013-09-29 DIAGNOSIS — R0602 Shortness of breath: Secondary | ICD-10-CM | POA: Insufficient documentation

## 2013-09-29 DIAGNOSIS — R0789 Other chest pain: Secondary | ICD-10-CM

## 2013-09-29 DIAGNOSIS — Z87891 Personal history of nicotine dependence: Secondary | ICD-10-CM | POA: Insufficient documentation

## 2013-09-29 DIAGNOSIS — R209 Unspecified disturbances of skin sensation: Secondary | ICD-10-CM | POA: Insufficient documentation

## 2013-09-29 DIAGNOSIS — Z79899 Other long term (current) drug therapy: Secondary | ICD-10-CM | POA: Insufficient documentation

## 2013-09-29 DIAGNOSIS — Z791 Long term (current) use of non-steroidal anti-inflammatories (NSAID): Secondary | ICD-10-CM | POA: Insufficient documentation

## 2013-09-29 LAB — CBC WITH DIFFERENTIAL/PLATELET
BASOS PCT: 1 % (ref 0–1)
Basophils Absolute: 0 10*3/uL (ref 0.0–0.1)
EOS ABS: 0.4 10*3/uL (ref 0.0–0.7)
EOS PCT: 9 % — AB (ref 0–5)
HCT: 40.5 % (ref 39.0–52.0)
Hemoglobin: 13.6 g/dL (ref 13.0–17.0)
Lymphocytes Relative: 36 % (ref 12–46)
Lymphs Abs: 1.7 10*3/uL (ref 0.7–4.0)
MCH: 31.6 pg (ref 26.0–34.0)
MCHC: 33.6 g/dL (ref 30.0–36.0)
MCV: 94 fL (ref 78.0–100.0)
MONO ABS: 0.4 10*3/uL (ref 0.1–1.0)
Monocytes Relative: 9 % (ref 3–12)
Neutro Abs: 2.2 10*3/uL (ref 1.7–7.7)
Neutrophils Relative %: 47 % (ref 43–77)
PLATELETS: 225 10*3/uL (ref 150–400)
RBC: 4.31 MIL/uL (ref 4.22–5.81)
RDW: 13 % (ref 11.5–15.5)
WBC: 4.8 10*3/uL (ref 4.0–10.5)

## 2013-09-29 LAB — BASIC METABOLIC PANEL
ANION GAP: 10 (ref 5–15)
BUN: 17 mg/dL (ref 6–23)
CO2: 28 mEq/L (ref 19–32)
Calcium: 9 mg/dL (ref 8.4–10.5)
Chloride: 105 mEq/L (ref 96–112)
Creatinine, Ser: 1.02 mg/dL (ref 0.50–1.35)
GFR calc Af Amer: >90 mL/min (ref >90)
Glucose, Bld: 84 mg/dL (ref 70–99)
Potassium: 5.3 mEq/L (ref 3.7–5.3)
SODIUM: 143 meq/L (ref 137–147)

## 2013-09-29 LAB — TROPONIN I: Troponin I: <0.3 ng/mL (ref <0.30)

## 2013-09-29 MED ORDER — PANTOPRAZOLE SODIUM 40 MG PO TBEC
40.0000 mg | DELAYED_RELEASE_TABLET | Freq: Once | ORAL | Status: AC
  Administered 2013-09-29: 40 mg via ORAL
  Filled 2013-09-29: qty 1

## 2013-09-29 MED ORDER — PANTOPRAZOLE SODIUM 20 MG PO TBEC: 20.0000 mg | DELAYED_RELEASE_TABLET | Freq: Every day | ORAL | Status: DC

## 2013-09-29 MED ORDER — GI COCKTAIL ~~LOC~~
30.0000 mL | Freq: Once | ORAL | Status: AC
  Administered 2013-09-29: 30 mL via ORAL
  Filled 2013-09-29: qty 30

## 2013-09-29 MED ORDER — PIPERACILLIN-TAZOBACTAM 3.375 G IVPB 30 MIN: 3.3750 g | Freq: Once | INTRAVENOUS | Status: DC

## 2013-09-29 NOTE — ED Provider Notes (Signed)
CSN: [id number]     Arrival date & time 09/29/13  1311 History   First MD Initiated Contact with Patient 09/29/13 1315     Chief Complaint  Patient presents with  . Chest Pain  . 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 Patient denies any radiation of pain and states the pain was worse with deep inspiration and movement. Patient states that he "felt hot" and short of breath secondary to worsening pain on inspiration. He also states that he experienced a tingling sensation in his left forearm and hand associated with chest pain onset. Patient took a meloxicam tablet with relief. He denies any chest pain at present while at rest. Patient further denies associated syncope, dizziness, lightheadedness, diaphoresis, nausea, vomiting, and extremity weakness. No personal hx of ACS, HTN, DM, or HLD. FHx of ACS in great uncle only. Patient, as an aside, endorses reflux symptoms 3 nights ago while sleeping which caused him a burning pain sensation in his mid-sternal area; states he ate fried chicken last night and pizza today.  Patient is a 38 y.o. male presenting with chest pain. The history is provided by the patient. No language interpreter was used.  Chest Pain Associated symptoms: shortness of breath   Associated symptoms: no diaphoresis, no fever, no nausea, no numbness, no palpitations, not vomiting and no weakness     History reviewed. No pertinent past medical history. History reviewed. No pertinent past surgical history. Family History  Problem Relation Age of Onset  . Diabetes Mother   . Diabetes Father    History  Substance Use Topics  . Smoking status: Former Games developermoker  . Smokeless tobacco:  Not on file  . Alcohol Use: No    Review of Systems  Constitutional: Negative for fever and diaphoresis.       "felt hot"  Respiratory: Positive for shortness of breath.   Cardiovascular: Positive for chest pain. Negative for palpitations.  Gastrointestinal: Negative for nausea, vomiting and diarrhea.  Neurological: Negative for weakness and numbness.       +L arm paresthesias.  All other systems reviewed and are negative.    Allergies  Shellfish allergy  Home Medications   Prior to Admission medications   Medication Sig Start Date End Date Taking? Authorizing Provider  meloxicam (MOBIC) 7.5 MG tablet Take 7.5 mg by mouth daily.   Yes Historical Provider, MD  pantoprazole (PROTONIX) 20 MG tablet Take 1 tablet (20 mg total) by mouth daily. 09/29/13   Antony MaduraKelly Lorretta Kerce, PA-C   BP 148/91  Pulse 70  Temp(Src) 97.9 F (36.6 C) (Oral)  Resp 20  SpO2 96%  Physical Exam  Nursing note and vitals reviewed. Constitutional: He is oriented to person, place, and time. He appears well-developed and well-nourished. No distress.   Nontoxic/nonseptic appearing  HENT:  Head: Normocephalic and atraumatic.  Eyes: Conjunctivae and EOM are normal. No scleral icterus.  Neck: Normal range of motion. Neck supple.  No JVD  Cardiovascular: Normal rate, regular rhythm, normal heart sounds and intact distal pulses.   Distal radial pulse 2+ bilaterally  Pulmonary/Chest: Effort normal and breath sounds normal. No respiratory distress. He has no wheezes. He has no rales. He exhibits tenderness.  Chest pain reproducible on palpation of sternum. Chest expansion symmetric.  Abdominal: Soft. He exhibits  no distension. There is no tenderness. There is no rebound and no guarding.  Soft abdomen without tenderness to palpation. No peritoneal signs.  Musculoskeletal: Normal range of motion.  Neurological: He is alert and oriented to person, place, and time. He exhibits normal muscle tone. Coordination normal.  No  gross sensory deficits appreciated. Patient moves extremities without ataxia.  Skin: Skin is warm and dry. No rash noted. He is not diaphoretic. No erythema. No pallor.  Psychiatric: He has a normal mood and affect. His behavior is normal.    ED Course  Procedures (including critical care time) Labs Review Labs Reviewed  CBC WITH DIFFERENTIAL - Abnormal; Notable for the following:    Eosinophils Relative 9 (*)    All other components within normal limits  BASIC METABOLIC PANEL  TROPONIN I  TROPONIN I    Imaging Review Dg Chest 2 View  09/29/2013   CLINICAL DATA:  One week of mid chest pain. Recently stopped smoking  EXAM: CHEST  2 VIEW  COMPARISON:  Portable chest x-ray of September 22, 2013.  FINDINGS: The lungs are borderline hypoinflated. There is no focal infiltrate. The heart and pulmonary vascularity are normal. The mediastinum is normal in width. There is no pleural effusion. The bony thorax is unremarkable.  IMPRESSION: Borderline hypoinflation. No acute cardiopulmonary abnormality is demonstrated.   Electronically Signed   By: David  [place]   On: 09/29/2013 14:16     EKG Interpretation   Date/Time:  Tuesday September 29 2013 13:16:14 EDT Ventricular Rate:  70 PR Interval:  174 QRS Duration: 86 QT Interval:  364 QTC Calculation: 393 R Axis:   36 Text Interpretation:  Sinus rhythm Left atrial enlargement Left  ventricular hypertrophy ST elev, probable normal early repol pattern Since  last tracing 22 Sep 2013 T wave inversion now evident in lead 3 Confirmed  by KNAPP  MD-I, IVA ([id number]) on 09/29/2013 1:30:03 PM      MDM   Final diagnoses:  Atypical chest pain    Patient is a 38 year old with no significant past medical history who presents to the emergency department for midsternal chest pain which is worse with deep inspiration, movement, and palpation to the chest wall. Physical exam significant for reproducible chest pain on palpation. Patient's workup today shows no  leukocytosis, anemia, or electrolyte imbalance. Troponin x2 negative, the last being 5 hours out from symptom onset. Chest x-ray shows no evidence of focal consolidation, pneumonia, mediastinal widening, or pleural effusion. EKG without evidence of STEMI. Patient does have nonspecific ST elevation in all leads which is most consistent with early repolarization. EKG also exhibits a T wave inversion in lead 3 which is new, but nonspecific. Heart score 1-2 c/w low risk of ACS. Symptoms also pleuritic in nature which is not c/w pain of cardiac etiology. Doubt ACS in this patient given reassuring work up. Doubt PE and aortic dissection.  Patient is stable for discharge today with instruction to followup with a cardiologist for further evaluation of his symptoms. Patient did endorse some symptoms consistent with gastritis/esophageal reflux for which I will prescribe Protonix. Return precautions discussed and provided. Patient agreeable to plan with no unaddressed concerns.   Filed Vitals:   09/29/13 1706 09/29/13 1715 09/29/13 1730 09/29/13 1800  BP: 142/89 150/89 152/96 148/91  Pulse: 74 60 68 70  Temp:      TempSrc:      Resp: 23 23 21 20   SpO2: 98% 100% 97% 96%     Antony Madura, PA-C  09/29/13 1856 

## 2013-09-29 NOTE — Discharge Instructions (Signed)

## 2013-09-29 NOTE — ED Notes (Addendum)
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 Pain is worse with deep inspiration, movement, and to palpation. Pt reports associated symptoms of "feeling hot," and SOB. Denies any other associated symptoms such as dizziness, lightheadedness, diaphoresis, or N/V. Pain does not radiate. Pt also reports left forearm numbness and hand tingling that began around the same time as the CP. Pt has hx of chest wall pain and reports this feels similar. Pt NSR on monitor and no neuro deficits noted. Lung sounds clear. Pt also reports epigastric pain and burning after he ate fried chicken last pm and pizza today. Pt A&Ox4, resp e/u, and skin warm and dry.

## 2013-09-29 NOTE — ED Provider Notes (Signed)
Medical screening examination/treatment/procedure(s) were performed by non-physician practitioner and as supervising physician I was immediately available for consultation/collaboration.   EKG Interpretation   Date/Time:  Tuesday September 29 2013 13:16:14 EDT Ventricular Rate:  70 PR Interval:  174 QRS Duration: 86 QT Interval:  364 QTC Calculation: 393 R Axis:   36 Text Interpretation:  Sinus rhythm Left atrial enlargement Left  ventricular hypertrophy ST elev, probable normal early repol pattern Since  last tracing 22 Sep 2013 T wave inversion now evident in lead 3 Confirmed  by Avante Carneiro  MD-I, Whitlee Sluder ([id number]) on 09/29/2013 1:30:03 PM      Devoria AlbeIva Jahzara Slattery, MD, Franz DellFACEP   Jenalyn Girdner L Exa Bomba, MD 09/29/13 1904

## 2013-11-04 ENCOUNTER — Institutional Professional Consult (permissible substitution): Payer: BC Managed Care – PPO | Admitting: Cardiovascular Disease

## 2013-11-05 ENCOUNTER — Encounter: Payer: Self-pay | Admitting: Cardiovascular Disease

## 2013-12-01 ENCOUNTER — Encounter: Payer: Self-pay | Admitting: Cardiovascular Disease

## 2019-09-07 ENCOUNTER — Other Ambulatory Visit: Payer: Self-pay

## 2019-09-07 ENCOUNTER — Emergency Department (HOSPITAL_COMMUNITY): Payer: Self-pay

## 2019-09-07 ENCOUNTER — Inpatient Hospital Stay (HOSPITAL_COMMUNITY)
Admission: EM | Admit: 2019-09-07 | Discharge: 2019-09-09 | DRG: 305 | Disposition: A | Discharge status: Home / Self Care | Payer: Self-pay | Attending: Family Medicine | Admitting: Family Medicine

## 2019-09-07 ENCOUNTER — Encounter (HOSPITAL_COMMUNITY): Payer: Self-pay

## 2019-09-07 DIAGNOSIS — Z20822 Contact with and (suspected) exposure to covid-19: Secondary | ICD-10-CM | POA: Diagnosis present

## 2019-09-07 DIAGNOSIS — Z87891 Personal history of nicotine dependence: Secondary | ICD-10-CM

## 2019-09-07 DIAGNOSIS — R45851 Suicidal ideations: Secondary | ICD-10-CM

## 2019-09-07 DIAGNOSIS — E876 Hypokalemia: Secondary | ICD-10-CM | POA: Diagnosis present

## 2019-09-07 DIAGNOSIS — G4700 Insomnia, unspecified: Secondary | ICD-10-CM | POA: Diagnosis present

## 2019-09-07 DIAGNOSIS — Z9114 Patient's other noncompliance with medication regimen: Secondary | ICD-10-CM

## 2019-09-07 DIAGNOSIS — Z62810 Personal history of physical and sexual abuse in childhood: Secondary | ICD-10-CM | POA: Diagnosis present

## 2019-09-07 DIAGNOSIS — F419 Anxiety disorder, unspecified: Secondary | ICD-10-CM | POA: Diagnosis present

## 2019-09-07 DIAGNOSIS — I248 Other forms of acute ischemic heart disease: Secondary | ICD-10-CM | POA: Diagnosis present

## 2019-09-07 DIAGNOSIS — I10 Essential (primary) hypertension: Secondary | ICD-10-CM | POA: Diagnosis present

## 2019-09-07 DIAGNOSIS — N179 Acute kidney failure, unspecified: Secondary | ICD-10-CM | POA: Diagnosis present

## 2019-09-07 DIAGNOSIS — Z915 Personal history of self-harm: Secondary | ICD-10-CM

## 2019-09-07 DIAGNOSIS — I160 Hypertensive urgency: Principal | ICD-10-CM

## 2019-09-07 DIAGNOSIS — F329 Major depressive disorder, single episode, unspecified: Secondary | ICD-10-CM | POA: Diagnosis present

## 2019-09-07 HISTORY — DX: Essential (primary) hypertension: I10

## 2019-09-07 LAB — CBC WITH DIFFERENTIAL/PLATELET
Abs Immature Granulocytes: 0.02 10*3/uL (ref 0.00–0.07)
Basophils Absolute: 0.1 10*3/uL (ref 0.0–0.1)
Basophils Relative: 1 %
Eosinophils Absolute: 0.4 10*3/uL (ref 0.0–0.5)
Eosinophils Relative: 6 %
HCT: 41.9 % (ref 39.0–52.0)
Hemoglobin: 14.3 g/dL (ref 13.0–17.0)
Immature Granulocytes: 0 %
Lymphocytes Relative: 26 %
Lymphs Abs: 1.9 10*3/uL (ref 0.7–4.0)
MCH: 32 pg (ref 26.0–34.0)
MCHC: 34.1 g/dL (ref 30.0–36.0)
MCV: 93.7 fL (ref 80.0–100.0)
Monocytes Absolute: 0.6 10*3/uL (ref 0.1–1.0)
Monocytes Relative: 9 %
Neutro Abs: 4.3 10*3/uL (ref 1.7–7.7)
Neutrophils Relative %: 58 %
Platelets: 246 10*3/uL (ref 150–400)
RBC: 4.47 MIL/uL (ref 4.22–5.81)
RDW: 12.5 % (ref 11.5–15.5)
WBC: 7.3 10*3/uL (ref 4.0–10.5)
nRBC: 0 % (ref 0.0–0.2)

## 2019-09-07 LAB — COMPREHENSIVE METABOLIC PANEL
ALT: 63 U/L — ABNORMAL HIGH (ref 0–44)
AST: 44 U/L — ABNORMAL HIGH (ref 15–41)
Albumin: 4.2 g/dL (ref 3.5–5.0)
Alkaline Phosphatase: 102 U/L (ref 38–126)
Anion gap: 10 (ref 5–15)
BUN: 20 mg/dL (ref 6–20)
CO2: 24 mmol/L (ref 22–32)
Calcium: 8.6 mg/dL — ABNORMAL LOW (ref 8.9–10.3)
Chloride: 109 mmol/L (ref 98–111)
Creatinine, Ser: 1.6 mg/dL — ABNORMAL HIGH (ref 0.61–1.24)
GFR calc Af Amer: >60 mL/min (ref >60)
GFR calc non Af Amer: 52 mL/min — ABNORMAL LOW (ref >60)
Glucose, Bld: 105 mg/dL — ABNORMAL HIGH (ref 70–99)
Potassium: 3.6 mmol/L (ref 3.5–5.1)
Sodium: 143 mmol/L (ref 135–145)
Total Bilirubin: 0.6 mg/dL (ref 0.3–1.2)
Total Protein: 7.7 g/dL (ref 6.5–8.1)

## 2019-09-07 LAB — TROPONIN I (HIGH SENSITIVITY): Troponin I (High Sensitivity): 59 ng/L — ABNORMAL HIGH (ref <18)

## 2019-09-07 LAB — ETHANOL: Alcohol, Ethyl (B): <10 mg/dL (ref <10)

## 2019-09-07 LAB — SARS CORONAVIRUS 2 BY RT PCR (HOSPITAL ORDER, PERFORMED IN ~~LOC~~ HOSPITAL LAB): SARS Coronavirus 2: NEGATIVE

## 2019-09-07 LAB — ACETAMINOPHEN LEVEL: Acetaminophen (Tylenol), Serum: <10 ug/mL — ABNORMAL LOW (ref 10–30)

## 2019-09-07 LAB — SALICYLATE LEVEL: Salicylate Lvl: <7 mg/dL — ABNORMAL LOW (ref 7.0–30.0)

## 2019-09-07 MED ORDER — LABETALOL HCL 5 MG/ML IV SOLN
20.0000 mg | Freq: Once | INTRAVENOUS | Status: AC
  Administered 2019-09-07: 20 mg via INTRAVENOUS
  Filled 2019-09-07: qty 4

## 2019-09-07 NOTE — ED Provider Notes (Signed)
[facility]-EMERGENCY DEPT Provider Note   CSN: [id number] Arrival date & time: 09/07/19  2042     History Chief Complaint  Patient presents with  . Suicidal    Mark Hayden is a 44 y.o. male with a history of hypertension who presents to the emergency department accompanied by mobile crisis with a chief complaint of suicidal ideation.  Mobile crisis was contacted after the patient called and stated that he was going to hang himself.  States that he had a rope, but not the intent to follow through.  He reports a longstanding history of suicidal thoughts, but does not think that he could go through with it as he has a 16 year old son to live for and was also previously told by a family member that if he were to kill himself that it would be selfish.  States that he had 1 prior suicide attempt when he attempted to hang himself when he was 44 years old.  He denies any additional attempts.  He states that this was secondary to a very difficult childhood.  He is unable to state any specific new or worsening stressors, but reports that he is having increased stress and all aspects of his life.  He quit his job where he had been for the last 5 years 1 month ago due to increasing stress.   He describes his symptoms as similar to a Arn Medal movie in the 80s " he is the goodie two shoes  who has always done everything right, but 1 day everything gets too much and he just snaps."  He states "I do not think that I am going to take a machine gun out and kill everyone, but I feel like it had a breaking point."  States that he has had 1 similar episode more than 10 years ago however he reached the same point.  He reports that he coped during that episode by pulling out all of his hair.  He states that he is feeling as if he also wants to pull out all of his hair today, but has been able to refrain thus far.  He reports that he has been feeling increasingly angry and  aggressive, but just keeps trying to push it down.  He has been coping by playing video games, but reports that the symptoms have now worsened.  He denies HI or auditory or visual hallucinations.   He has a history of hypertension, but has not taken medication in more than 5 years.  He cannot recall the name of the medication, but states he was taking a little yellow pill before he stopped taking the medication on his own.  He reports that he has been having headaches for months or years.  He is currently endorsing a headache in the ER, but states that it is not worse.  He also reports chronic palpitations " where his heart is pounding and beating out of his chest."  He reports that he has been having some bilateral low back pain and states "it might be my kidneys.  We have a family history of kidney problems." He has been urinating more frequently over the last few days and reports increased thirst since yesterday.  He denies chest pain, shortness of breath, cough, fever, chills, abdominal pain, nausea, vomiting, diarrhea, visual changes, numbness, weakness, rash, difficulty urinating, neck pain.  He does not drink alcohol.  He stopped using tobacco 4 years ago.  He used to use marijuana when he would  start suddenly crying for no reason, but stopped more than 5 months ago when he would continue to cry despite using cannabis..  He denies any other illicit or recreational substance use.  The history is provided by the patient and medical records. No language interpreter was used.       Past Medical History:  Diagnosis Date  . Hypertension     Patient Active Problem List   Diagnosis Date Noted  . Hypertensive urgency 09/08/2019  . Suicidal thoughts 09/08/2019    History reviewed. No pertinent surgical history.     Family History  Problem Relation Age of Onset  . Diabetes Mother   . Diabetes Father     Social History   Tobacco Use  . Smoking status: Former Games developermoker  . Smokeless  tobacco: Never Used  Substance Use Topics  . Alcohol use: No  . Drug use: No    Home Medications Prior to Admission medications   Medication Sig Start Date End Date Taking? Authorizing Provider  pantoprazole (PROTONIX) 20 MG tablet Take 1 tablet (20 mg total) by mouth daily. Patient not taking: Reported on 09/07/2019 09/29/13   Antony MaduraHumes, Kelly, PA-C    Allergies    Shellfish allergy  Review of Systems   Review of Systems  Constitutional: Negative for appetite change, chills and fever.  HENT: Negative for congestion and sore throat.   Eyes: Negative for visual disturbance.  Respiratory: Negative for shortness of breath and wheezing.   Cardiovascular: Negative for chest pain and palpitations.  Gastrointestinal: Negative for abdominal pain, diarrhea, nausea and vomiting.  Endocrine: Positive for polydipsia and polyuria.  Genitourinary: Positive for frequency. Negative for dysuria and urgency.  Musculoskeletal: Positive for back pain. Negative for arthralgias, myalgias, neck pain and neck stiffness.  Skin: Negative for rash.  Allergic/Immunologic: Negative for immunocompromised state.  Neurological: Positive for headaches. Negative for seizures, syncope, weakness and numbness.  Psychiatric/Behavioral: Negative for confusion.    Physical Exam Updated Vital Signs BP (!) 169/122   Pulse 71   Temp 98.1 F (36.7 C) (Oral)   Resp (!) 21   Wt 107 kg   SpO2 97%   BMI 34.84 kg/m   Physical Exam Vitals and nursing note reviewed.  Constitutional:      General: He is not in acute distress.    Appearance: He is well-developed. He is not ill-appearing, toxic-appearing or diaphoretic.  HENT:     Head: Normocephalic.  Eyes:     Extraocular Movements: Extraocular movements intact.     Conjunctiva/sclera: Conjunctivae normal.     Pupils: Pupils are equal, round, and reactive to light.  Cardiovascular:     Rate and Rhythm: Normal rate and regular rhythm.     Pulses: Normal pulses.      Heart sounds: Normal heart sounds. No murmur heard.  No friction rub. No gallop.   Pulmonary:     Effort: Pulmonary effort is normal. No respiratory distress.     Breath sounds: No stridor. No wheezing, rhonchi or rales.     Comments: Lungs are clear to auscultation bilaterally Chest:     Chest wall: No tenderness.  Abdominal:     General: There is no distension.     Palpations: Abdomen is soft. There is no mass.     Tenderness: There is no abdominal tenderness. There is no right CVA tenderness, left CVA tenderness, guarding or rebound.     Hernia: No hernia is present.  Musculoskeletal:     Cervical back: Neck supple.  Right lower leg: No edema.     Left lower leg: No edema.  Skin:    General: Skin is warm and dry.  Neurological:     General: No focal deficit present.     Mental Status: He is alert.  Psychiatric:        Attention and Perception: He does not perceive auditory or visual hallucinations.        Mood and Affect: Mood is depressed.        Speech: Speech is rapid and pressured.        Behavior: Behavior is hyperactive.        Thought Content: Thought content is not paranoid or delusional. Thought content includes suicidal ideation. Thought content does not include homicidal ideation. Thought content includes suicidal plan. Thought content does not include homicidal plan.     ED Results / Procedures / Treatments   Labs (all labs ordered are listed, but only abnormal results are displayed) Labs Reviewed  COMPREHENSIVE METABOLIC PANEL - Abnormal; Notable for the following components:      Result Value   Glucose, Bld 105 (*)    Creatinine, Ser 1.60 (*)    Calcium 8.6 (*)    AST 44 (*)    ALT 63 (*)    GFR calc non Af Amer 52 (*)    All other components within normal limits  ACETAMINOPHEN LEVEL - Abnormal; Notable for the following components:   Acetaminophen (Tylenol), Serum <10 (*)    All other components within normal limits  SALICYLATE LEVEL - Abnormal;  Notable for the following components:   Salicylate Lvl <2.5 (*)    All other components within normal limits  COMPREHENSIVE METABOLIC PANEL - Abnormal; Notable for the following components:   Glucose, Bld 103 (*)    Creatinine, Ser 1.38 (*)    Calcium 8.6 (*)    ALT 56 (*)    All other components within normal limits  TROPONIN I (HIGH SENSITIVITY) - Abnormal; Notable for the following components:   Troponin I (High Sensitivity) 59 (*)    All other components within normal limits  TROPONIN I (HIGH SENSITIVITY) - Abnormal; Notable for the following components:   Troponin I (High Sensitivity) 48 (*)    All other components within normal limits  TROPONIN I (HIGH SENSITIVITY) - Abnormal; Notable for the following components:   Troponin I (High Sensitivity) 47 (*)    All other components within normal limits  SARS CORONAVIRUS 2 BY RT PCR (HOSPITAL ORDER, [place] [facility])  CBC WITH DIFFERENTIAL/PLATELET  ETHANOL  CBC WITH DIFFERENTIAL/PLATELET  TSH  RAPID URINE DRUG SCREEN, HOSP PERFORMED  URINALYSIS, ROUTINE W REFLEX MICROSCOPIC  HIV ANTIBODY (ROUTINE TESTING W REFLEX)  CBC  HEPATITIS PANEL, ACUTE    EKG EKG Interpretation  Date/Time:  Monday September 07 2019 21:34:19 EDT Ventricular Rate:  79 PR Interval:    QRS Duration: 89 QT Interval:  372 QTC Calculation: 427 R Axis:   25 Text Interpretation: Sinus rhythm Left atrial enlargement Abnormal R-wave progression, early transition LVH with secondary repolarization abnormality When compared with ECG of 09/29/2013, T wave inversion more evident in Anterolateral leads Confirmed by Delora Fuel ([id number]) on 09/07/2019 11:39:27 PM   Radiology CT Head Wo Contrast  Result Date: 09/08/2019 CLINICAL DATA:  Headache, intracranial hemorrhage suspected EXAM: CT HEAD WITHOUT CONTRAST TECHNIQUE: Contiguous axial images were obtained from the base of the skull through the vertex without intravenous contrast. COMPARISON:  None.  FINDINGS: Brain: No  intracranial hemorrhage, mass effect, or midline shift. No hydrocephalus. The basilar cisterns are patent. No evidence of territorial infarct or acute ischemia. No extra-axial or intracranial fluid collection. Vascular: No hyperdense vessel or unexpected calcification. Skull: Normal. Negative for fracture or focal lesion. Sinuses/Orbits: Minor mucosal thickening of ethmoid air cells. No sinus fluid level or acute finding. No mastoid effusion. Orbits are unremarkable. Other: None. IMPRESSION: Negative noncontrast head CT. Electronically Signed   By: Narda Rutherford M.D.   On: 09/08/2019 00:23   DG Chest Port 1 View  Result Date: 09/07/2019 CLINICAL DATA:  Elevated blood pressure and weakness. EXAM: PORTABLE CHEST 1 VIEW COMPARISON:  September 29, 2013 FINDINGS: There is no evidence of acute infiltrate, pleural effusion or pneumothorax. The heart size and mediastinal contours are within normal limits. The visualized skeletal structures are unremarkable. IMPRESSION: No active disease. Electronically Signed   By: Aram Candela M.D.   On: 09/07/2019 22:38    Procedures .Critical Care Performed by: Barkley Boards, PA-C Authorized by: Barkley Boards, PA-C   Critical care provider statement:    Critical care time (minutes):  45   Critical care time was exclusive of:  Separately billable procedures and treating other patients and teaching time   Critical care was necessary to treat or prevent imminent or life-threatening deterioration of the following conditions:  Circulatory failure   Critical care was time spent personally by me on the following activities:  Ordering and performing treatments and interventions, ordering and review of laboratory studies, ordering and review of radiographic studies, pulse oximetry, re-evaluation of patient's condition, review of old charts, obtaining history from patient or surrogate, examination of patient, evaluation of patient's response to treatment,  development of treatment plan with patient or surrogate and discussions with consultants   I assumed direction of critical care for this patient from another provider in my specialty: no     (including critical care time)  Medications Ordered in ED Medications  amLODipine (NORVASC) tablet 5 mg (5 mg Oral Given 09/08/19 0258)  hydrALAZINE (APRESOLINE) injection 10 mg (10 mg Intravenous Given 09/08/19 0527)  hydrALAZINE (APRESOLINE) tablet 25 mg (25 mg Oral Given 09/08/19 0258)  ondansetron (ZOFRAN) tablet 4 mg (has no administration in time range)    Or  ondansetron (ZOFRAN) injection 4 mg (has no administration in time range)  enoxaparin (LOVENOX) injection 40 mg (has no administration in time range)  labetalol (NORMODYNE) injection 20 mg (20 mg Intravenous Given 09/07/19 2220)  labetalol (NORMODYNE) injection 20 mg (20 mg Intravenous Given 09/08/19 0122)    ED Course  I have reviewed the triage vital signs and the nursing notes.  Pertinent labs & imaging results that were available during my care of the patient were reviewed by me and considered in my medical decision making (see chart for details).    MDM Rules/Calculators/A&P                          44 year old male with a history of hypertension brought in by mobile crisis unit after patient's called inducing suicidal ideation with a plan to hang himself.  He had a rope, but states that he was unable to go through with it.  In the ER, he is markedly hypertensive.  He stopped taking his antihypertensive medication more than 5 years ago.  He is endorsing a chronic headache and palpitations as well as some acute bilateral low back pain, polyuria, polydipsia.  Vital signs are otherwise unremarkable.  Initial troponin is elevated at 59.  EKG with sinus rhythm, but T wave inversion in anterolateral leads.  Repeat troponin is downtrending at 48.  Troponin could be elevated secondary to hypertensive urgency versus hypertensive nephropathy as  patient's creatinine as 1.6 (1.05 in 2015).  Chest x-ray is clear.  No evidence of pulmonary edema on my read.  He has also been endorsing headache, which he states is chronic, but in the setting of marked hypertension, CT head was obtained, which is unremarkable.  No focal neurologic deficits on exam.  Dr. Preston Fleeting, 20 physician, reevaluated the patient, and at that time, he was reporting that headache had resolved.  Transaminases are mildly elevated.  He denies alcohol use.  These could be elevated secondary to hypertensive urgency.  He has no abdominal tenderness.  Patient has been given 20 of IV labetalol x2 and blood pressures are slowly downtrending, 195/120, down from 240/140 on arrival.  Patient cannot be medically cleared at this time for psychiatry given hypertensive urgency.  Consult to the hospitalist team and spoke with Dr. Toniann Fail who will accept the patient for admission.  Consult to TTS has been placed as patient will need to be seen by psychiatry.  Final Clinical Impression(s) / ED Diagnoses Final diagnoses:  Hypertensive urgency  Suicidal ideations    Rx / DC Orders ED Discharge Orders    None       Barkley Boards, PA-C 09/08/19 0718    Dione Booze, MD 09/08/19 4193    Dione Booze, MD 09/08/19 [phone]

## 2019-09-07 NOTE — ED Notes (Signed)
Pt's belongings labeled and at nurse's station

## 2019-09-07 NOTE — ED Notes (Signed)
Mia Mcdonald, PA aware pts BP 215/139. PA currently bedside.

## 2019-09-07 NOTE — ED Notes (Signed)
Pt states that he has a hx of hypertension, pt was seen by a MD 5 years ago and diagnosed and was prescribed a blood pressure medication (pt does not recall name of med) pt also states he did not take the meds and has not been taking any blood pressure meds in the last 5 years.

## 2019-09-07 NOTE — ED Triage Notes (Signed)
Pt BIB Mark Hayden, with mobile crisis. Pt called mobile crisis stating he was going to hang himself, pt stated he had the rope but not the intent to follow through--just having thoughts of SI. Pt c/o hopelessness and insomnia, isolation. Pt calm and cooperative at this time.

## 2019-09-07 NOTE — ED Notes (Signed)
Dr. Estell Harpin aware pt BP 231/143.

## 2019-09-08 ENCOUNTER — Encounter (HOSPITAL_COMMUNITY): Payer: Self-pay | Admitting: Internal Medicine

## 2019-09-08 DIAGNOSIS — N189 Chronic kidney disease, unspecified: Secondary | ICD-10-CM | POA: Insufficient documentation

## 2019-09-08 DIAGNOSIS — R45851 Suicidal ideations: Secondary | ICD-10-CM

## 2019-09-08 DIAGNOSIS — N179 Acute kidney failure, unspecified: Secondary | ICD-10-CM | POA: Insufficient documentation

## 2019-09-08 DIAGNOSIS — I160 Hypertensive urgency: Secondary | ICD-10-CM | POA: Diagnosis present

## 2019-09-08 HISTORY — DX: Suicidal ideations: R45.851

## 2019-09-08 LAB — HEPATITIS PANEL, ACUTE
HCV Ab: NONREACTIVE
Hep A IgM: NONREACTIVE
Hep B C IgM: NONREACTIVE
Hepatitis B Surface Ag: NONREACTIVE

## 2019-09-08 LAB — CBC WITH DIFFERENTIAL/PLATELET
Abs Immature Granulocytes: 0.02 10*3/uL (ref 0.00–0.07)
Basophils Absolute: 0.1 10*3/uL (ref 0.0–0.1)
Basophils Relative: 1 %
Eosinophils Absolute: 0.4 10*3/uL (ref 0.0–0.5)
Eosinophils Relative: 6 %
HCT: 40.3 % (ref 39.0–52.0)
Hemoglobin: 13.6 g/dL (ref 13.0–17.0)
Immature Granulocytes: 0 %
Lymphocytes Relative: 26 %
Lymphs Abs: 1.8 10*3/uL (ref 0.7–4.0)
MCH: 31.7 pg (ref 26.0–34.0)
MCHC: 33.7 g/dL (ref 30.0–36.0)
MCV: 93.9 fL (ref 80.0–100.0)
Monocytes Absolute: 0.5 10*3/uL (ref 0.1–1.0)
Monocytes Relative: 8 %
Neutro Abs: 4.1 10*3/uL (ref 1.7–7.7)
Neutrophils Relative %: 59 %
Platelets: 226 10*3/uL (ref 150–400)
RBC: 4.29 MIL/uL (ref 4.22–5.81)
RDW: 12.8 % (ref 11.5–15.5)
WBC: 6.9 10*3/uL (ref 4.0–10.5)
nRBC: 0 % (ref 0.0–0.2)

## 2019-09-08 LAB — COMPREHENSIVE METABOLIC PANEL
ALT: 56 U/L — ABNORMAL HIGH (ref 0–44)
AST: 36 U/L (ref 15–41)
Albumin: 3.8 g/dL (ref 3.5–5.0)
Alkaline Phosphatase: 96 U/L (ref 38–126)
Anion gap: 9 (ref 5–15)
BUN: 17 mg/dL (ref 6–20)
CO2: 22 mmol/L (ref 22–32)
Calcium: 8.6 mg/dL — ABNORMAL LOW (ref 8.9–10.3)
Chloride: 108 mmol/L (ref 98–111)
Creatinine, Ser: 1.38 mg/dL — ABNORMAL HIGH (ref 0.61–1.24)
GFR calc Af Amer: >60 mL/min (ref >60)
GFR calc non Af Amer: >60 mL/min (ref >60)
Glucose, Bld: 103 mg/dL — ABNORMAL HIGH (ref 70–99)
Potassium: 3.5 mmol/L (ref 3.5–5.1)
Sodium: 139 mmol/L (ref 135–145)
Total Bilirubin: 0.7 mg/dL (ref 0.3–1.2)
Total Protein: 7 g/dL (ref 6.5–8.1)

## 2019-09-08 LAB — URINALYSIS, ROUTINE W REFLEX MICROSCOPIC
Bilirubin Urine: NEGATIVE
Glucose, UA: 50 mg/dL — AB
Hgb urine dipstick: NEGATIVE
Ketones, ur: NEGATIVE mg/dL
Leukocytes,Ua: NEGATIVE
Nitrite: NEGATIVE
Protein, ur: NEGATIVE mg/dL
Specific Gravity, Urine: 1.019 (ref 1.005–1.030)
pH: 6 (ref 5.0–8.0)

## 2019-09-08 LAB — TROPONIN I (HIGH SENSITIVITY)
Troponin I (High Sensitivity): 47 ng/L — ABNORMAL HIGH (ref <18)
Troponin I (High Sensitivity): 48 ng/L — ABNORMAL HIGH (ref <18)

## 2019-09-08 LAB — RAPID URINE DRUG SCREEN, HOSP PERFORMED
Amphetamines: NOT DETECTED
Barbiturates: NOT DETECTED
Benzodiazepines: NOT DETECTED
Cocaine: NOT DETECTED
Opiates: NOT DETECTED
Tetrahydrocannabinol: POSITIVE — AB

## 2019-09-08 LAB — SODIUM, URINE, RANDOM: Sodium, Ur: 142 mmol/L

## 2019-09-08 LAB — OSMOLALITY, URINE: Osmolality, Ur: 737 mOsm/kg (ref 300–900)

## 2019-09-08 LAB — TSH: TSH: 1.466 u[IU]/mL (ref 0.350–4.500)

## 2019-09-08 LAB — HIV ANTIBODY (ROUTINE TESTING W REFLEX): HIV Screen 4th Generation wRfx: NONREACTIVE

## 2019-09-08 LAB — CREATININE, URINE, RANDOM: Creatinine, Urine: 215.96 mg/dL

## 2019-09-08 MED ORDER — ENOXAPARIN SODIUM 40 MG/0.4ML ~~LOC~~ SOLN
40.0000 mg | SUBCUTANEOUS | Status: DC
  Filled 2019-09-08: qty 0.4

## 2019-09-08 MED ORDER — AMLODIPINE BESYLATE 5 MG PO TABS
5.0000 mg | ORAL_TABLET | Freq: Every day | ORAL | Status: DC
  Administered 2019-09-08 – 2019-09-09 (×2): 5 mg via ORAL
  Filled 2019-09-08 (×2): qty 1

## 2019-09-08 MED ORDER — HYDRALAZINE HCL 20 MG/ML IJ SOLN
10.0000 mg | INTRAMUSCULAR | Status: DC | PRN
  Administered 2019-09-08 – 2019-09-09 (×3): 10 mg via INTRAVENOUS
  Filled 2019-09-08 (×5): qty 1

## 2019-09-08 MED ORDER — LABETALOL HCL 5 MG/ML IV SOLN
20.0000 mg | Freq: Once | INTRAVENOUS | Status: AC
  Administered 2019-09-08: 20 mg via INTRAVENOUS
  Filled 2019-09-08: qty 4

## 2019-09-08 MED ORDER — ONDANSETRON HCL 4 MG PO TABS: 4.0000 mg | ORAL_TABLET | Freq: Four times a day (QID) | ORAL | Status: DC | PRN

## 2019-09-08 MED ORDER — HYDRALAZINE HCL 25 MG PO TABS
25.0000 mg | ORAL_TABLET | Freq: Three times a day (TID) | ORAL | Status: DC
  Administered 2019-09-08 – 2019-09-09 (×6): 25 mg via ORAL
  Filled 2019-09-08 (×6): qty 1

## 2019-09-08 MED ORDER — ONDANSETRON HCL 4 MG/2ML IJ SOLN: 4.0000 mg | Freq: Four times a day (QID) | INTRAMUSCULAR | Status: DC | PRN

## 2019-09-08 NOTE — H&P (Signed)
History and Physical    Mark Hayden XEN:[id number] DOB: Apr 24, 1975 DOA: 09/07/2019  PCP: Mark Hayden  Patient coming from: Home.  Chief Complaint: Suicidal thoughts.  HPI: Mark Hayden is a 44 y.o. male with history of hypertension who has not been taking his medications for all last 4 years due to financial issues has had some suicidal thoughts trying to hang himself but has not attempted and call the mobile crisis service and was brought to the ER.  Patient has had been having some heartburns off and on when he lies down but denies any chest pain shortness of breath or exertional symptoms.  ED Course: In the ER patient was found to have markedly elevated blood pressure with the systolic in 240 diastolic 137.  Patient was given IV labetalol following which blood pressure improved but still sustained to be high.  Admitted for hypertensive urgency.  Behavioral health has been consulted for the suicidal thoughts.  Tylenol level and salicylate levels are negative LFTs are mildly elevated creatinine is 1.6.  Patient is high sensitive troponin was 59 and 48 and EKG shows LVH changes.  Review of Systems: As Hayden HPI, rest all negative.   Past Medical History:  Diagnosis Date  . Hypertension     History reviewed. No pertinent surgical history.   reports that he has quit smoking. He has never used smokeless tobacco. He reports that he does not drink alcohol and does not use drugs.  Allergies  Allergen Reactions  . Shellfish Allergy Anaphylaxis and Itching    seafood    Family History  Problem Relation Age of Onset  . Diabetes Mother   . Diabetes Father     Prior to Admission medications   Medication Sig Start Date End Date Taking? Authorizing Provider  pantoprazole (PROTONIX) 20 MG tablet Take 1 tablet (20 mg total) by mouth daily. Patient not taking: Reported on 09/07/2019 09/29/13   Antony Madura, PA-C    Physical Exam: Constitutional: Moderately built  and nourished. Vitals:   09/08/19 0340 09/08/19 0400 09/08/19 0420 09/08/19 0520  BP: (!) 168/112 (!) 181/122 (!) 161/92 (!) 187/124  Pulse: 61 65 (!) 59 70  Resp: (!) 21 (!) 22 (!) 23 15  Temp:      TempSrc:      SpO2: 95% 97% 90% 99%   Eyes: Anicteric no pallor. ENMT: No discharge from the ears eyes nose or mouth. Neck: No mass felt.  No neck rigidity. Respiratory: No rhonchi or crepitations. Cardiovascular: S1-S2 heard. Abdomen: Soft nontender bowel sounds present. Musculoskeletal: No edema. Skin: No rash. Neurologic: Alert awake oriented time place and person.  Moves all extremities. Psychiatric: Suicidal thoughts.   Labs on Admission: I have personally reviewed following labs and imaging studies  CBC: Recent Labs  Lab 09/07/19 2215  WBC 7.3  NEUTROABS 4.3  HGB 14.3  HCT 41.9  MCV 93.7  PLT 246   Basic Metabolic Panel: Recent Labs  Lab 09/07/19 2215  NA 143  K 3.6  CL 109  CO2 24  GLUCOSE 105*  BUN 20  CREATININE 1.60*  CALCIUM 8.6*   GFR: CrCl cannot be calculated (Unknown ideal weight.). Liver Function Tests: Recent Labs  Lab 09/07/19 2215  AST 44*  ALT 63*  ALKPHOS 102  BILITOT 0.6  PROT 7.7  ALBUMIN 4.2   No results for input(s): LIPASE, AMYLASE in the last 168 hours. No results for input(s): AMMONIA in the last 168 hours. Coagulation Profile: No results for input(s):  INR, PROTIME in the last 168 hours. Cardiac Enzymes: No results for input(s): CKTOTAL, CKMB, CKMBINDEX, TROPONINI in the last 168 hours. BNP (last 3 results) No results for input(s): PROBNP in the last 8760 hours. HbA1C: No results for input(s): HGBA1C in the last 72 hours. CBG: No results for input(s): GLUCAP in the last 168 hours. Lipid Profile: No results for input(s): CHOL, HDL, LDLCALC, TRIG, CHOLHDL, LDLDIRECT in the last 72 hours. Thyroid Function Tests: No results for input(s): TSH, T4TOTAL, FREET4, T3FREE, THYROIDAB in the last 72 hours. Anemia Panel: No  results for input(s): VITAMINB12, FOLATE, FERRITIN, TIBC, IRON, RETICCTPCT in the last 72 hours. Urine analysis: No results found for: COLORURINE, APPEARANCEUR, LABSPEC, PHURINE, GLUCOSEU, HGBUR, BILIRUBINUR, KETONESUR, PROTEINUR, UROBILINOGEN, NITRITE, LEUKOCYTESUR Sepsis Labs: @LABRCNTIP (procalcitonin:4,lacticidven:4) ) Recent Results (from the past 240 hour(s))  SARS Coronavirus 2 by RT PCR (hospital order, performed in [facility] hospital lab) Nasopharyngeal Nasopharyngeal Swab     Status: None   Collection Time: 09/07/19 10:25 PM   Specimen: Nasopharyngeal Swab  Result Value Ref Range Status   SARS Coronavirus 2 NEGATIVE NEGATIVE Final    Comment: (NOTE) SARS-CoV-2 target nucleic acids are NOT DETECTED.  The SARS-CoV-2 RNA is generally detectable in upper and lower respiratory specimens during the acute phase of infection. The lowest concentration of SARS-CoV-2 viral copies this assay can detect is 250 copies / mL. A negative result does not preclude SARS-CoV-2 infection and should not be used as the sole basis for treatment or other patient management decisions.  A negative result may occur with improper specimen collection / handling, submission of specimen other than nasopharyngeal swab, presence of viral mutation(s) within the areas targeted by this assay, and inadequate number of viral copies (<250 copies / mL). A negative result must be combined with clinical observations, patient history, and epidemiological information.  Fact Sheet for Patients:   StrictlyIdeas.no  Fact Sheet for Healthcare Providers: BankingDealers.co.za  This test is not yet approved or  cleared by the [place] FDA and has been authorized for detection and/or diagnosis of SARS-CoV-2 by FDA under an Emergency Use Authorization (EUA).  This EUA will remain in effect (meaning this test can be used) for the duration of the COVID-19 declaration under  Section 564(b)(1) of the Act, 21 U.S.C. section 360bbb-3(b)(1), unless the authorization is terminated or revoked sooner.  Performed at [facility], [place] [address] [place], [place] [address]      Radiological Exams on Admission: CT Head Wo Contrast  Result Date: 09/08/2019 CLINICAL DATA:  Headache, intracranial hemorrhage suspected EXAM: CT HEAD WITHOUT CONTRAST TECHNIQUE: Contiguous axial images were obtained from the base of the skull through the vertex without intravenous contrast. COMPARISON:  None. FINDINGS: Brain: No intracranial hemorrhage, mass effect, or midline shift. No hydrocephalus. The basilar cisterns are patent. No evidence of territorial infarct or acute ischemia. No extra-axial or intracranial fluid collection. Vascular: No hyperdense vessel or unexpected calcification. Skull: Normal. Negative for fracture or focal lesion. Sinuses/Orbits: Minor mucosal thickening of ethmoid air cells. No sinus fluid level or acute finding. No mastoid effusion. Orbits are unremarkable. Other: None. IMPRESSION: Negative noncontrast head CT. Electronically Signed   By: Keith Rake M.D.   On: 09/08/2019 00:23   DG Chest Port 1 View  Result Date: 09/07/2019 CLINICAL DATA:  Elevated blood pressure and weakness. EXAM: PORTABLE CHEST 1 VIEW COMPARISON:  September 29, 2013 FINDINGS: There is no evidence of acute infiltrate, pleural effusion or pneumothorax. The heart size and mediastinal contours  are within normal limits. The visualized skeletal structures are unremarkable. IMPRESSION: No active disease. Electronically Signed   By: Aram Candela M.D.   On: 09/07/2019 22:38    EKG: Independently reviewed.  Normal sinus rhythm LVH.  Assessment/Plan Principal Problem:   Hypertensive urgency Active Problems:   Suicidal thoughts    1. Hypertensive urgency -has not been taking his antihypertensive for 4 years due to financial issues.  I have started patient on amlodipine and  hydralazine.  As needed IV hydralazine for blood pressure trends. 2. Suicidal thoughts we will keep patient suicide precaution consult psychiatry. 3. Acute renal failure -creatinine when compared to 2015 is increased.  Check UA.  Follow metabolic panel. 4. Mildly elevated LFTs -patient denies drinking alcohol.  Abdomen appears benign.  Denies taking any Tylenol or any other drugs.  Follow LFTs closely.  Check acute hepatitis panel with next blood draw. 5. Mildly elevated high-sensitivity troponin.  Has been flat.  We will continue to cycle troponin and check for trends.   DVT prophylaxis: Lovenox. Code Status: Full code. Family Communication: Discussed with patient. Disposition Plan: Home. Consults called: None. Admission status: Observation.   Eduard Clos MD [facility] Pager [phone].  If 7PM-7AM, please contact night-coverage [email or web address] Password [facility]  09/08/2019, 5:29 AM

## 2019-09-08 NOTE — Progress Notes (Signed)
Care started prior to midnight in the emergency room and patient was admitted early this morning after midnight by my partner and colleague Dr. Midge Minium.  Additional changes to the plan of care been made accordingly.  The patient is a 44 year old-year-old with a past medical history significant for hypertension has not been taking his medications for the last 4 years due to financial issues who presented with suicidal thoughts of trying to hang himself but has not attempted to call the mobile crisis service and he was brought to the ER.  He complains of having some heartburn when he lays down and some back pain a little bit.  In the ED is found of a markedly elevated blood pressure of 240/137 he was given IV labetalol.  TTS has been consulted as well as psychiatry and still awaiting to see the patient.  Tentative urgency and started on p.o. antihypertensives despite this still has elevated blood pressure so we will give him another dose of IV labetalol.  Because he continues to have suicidal thoughts patient wants to speak with psychiatry and psychiatric services and this is still pending to be done.  He is slightly improving and his renal function is improving now.  Awaiting psychiatric evaluation and improvement in his blood pressure.  We will continue to monitor the patient's clinical response to intervention and repeat blood work in the a.m. and follow-up on psychiatric recommendations.
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
## 2019-09-09 LAB — COMPREHENSIVE METABOLIC PANEL
ALT: 60 U/L — ABNORMAL HIGH (ref 0–44)
AST: 29 U/L (ref 15–41)
Albumin: 3.9 g/dL (ref 3.5–5.0)
Alkaline Phosphatase: 98 U/L (ref 38–126)
Anion gap: 8 (ref 5–15)
BUN: 17 mg/dL (ref 6–20)
CO2: 23 mmol/L (ref 22–32)
Calcium: 8.6 mg/dL — ABNORMAL LOW (ref 8.9–10.3)
Chloride: 106 mmol/L (ref 98–111)
Creatinine, Ser: 1.2 mg/dL (ref 0.61–1.24)
GFR calc Af Amer: >60 mL/min (ref >60)
GFR calc non Af Amer: >60 mL/min (ref >60)
Glucose, Bld: 107 mg/dL — ABNORMAL HIGH (ref 70–99)
Potassium: 3.4 mmol/L — ABNORMAL LOW (ref 3.5–5.1)
Sodium: 137 mmol/L (ref 135–145)
Total Bilirubin: 0.7 mg/dL (ref 0.3–1.2)
Total Protein: 7.4 g/dL (ref 6.5–8.1)

## 2019-09-09 LAB — CBC WITH DIFFERENTIAL/PLATELET
Abs Immature Granulocytes: 0.03 10*3/uL (ref 0.00–0.07)
Basophils Absolute: 0 10*3/uL (ref 0.0–0.1)
Basophils Relative: 1 %
Eosinophils Absolute: 0.3 10*3/uL (ref 0.0–0.5)
Eosinophils Relative: 4 %
HCT: 42.8 % (ref 39.0–52.0)
Hemoglobin: 14.5 g/dL (ref 13.0–17.0)
Immature Granulocytes: 0 %
Lymphocytes Relative: 14 %
Lymphs Abs: 1 10*3/uL (ref 0.7–4.0)
MCH: 31.5 pg (ref 26.0–34.0)
MCHC: 33.9 g/dL (ref 30.0–36.0)
MCV: 93 fL (ref 80.0–100.0)
Monocytes Absolute: 0.4 10*3/uL (ref 0.1–1.0)
Monocytes Relative: 5 %
Neutro Abs: 5.7 10*3/uL (ref 1.7–7.7)
Neutrophils Relative %: 76 %
Platelets: 240 10*3/uL (ref 150–400)
RBC: 4.6 MIL/uL (ref 4.22–5.81)
RDW: 12.7 % (ref 11.5–15.5)
WBC: 7.5 10*3/uL (ref 4.0–10.5)
nRBC: 0 % (ref 0.0–0.2)

## 2019-09-09 LAB — MAGNESIUM: Magnesium: 2.1 mg/dL (ref 1.7–2.4)

## 2019-09-09 LAB — PHOSPHORUS: Phosphorus: 1.8 mg/dL — ABNORMAL LOW (ref 2.5–4.6)

## 2019-09-09 MED ORDER — AMLODIPINE BESYLATE 5 MG PO TABS: 5.0000 mg | ORAL_TABLET | Freq: Every day | ORAL | 0 refills | Status: DC

## 2019-09-09 MED ORDER — POTASSIUM PHOSPHATES 15 MMOLE/5ML IV SOLN
30.0000 mmol | Freq: Once | INTRAVENOUS | Status: AC
  Administered 2019-09-09: 30 mmol via INTRAVENOUS
  Filled 2019-09-09: qty 10

## 2019-09-09 MED ORDER — ESCITALOPRAM OXALATE 10 MG PO TABS
5.0000 mg | ORAL_TABLET | Freq: Every day | ORAL | 0 refills | Status: DC
Start: 2019-09-09 — End: 2022-08-20

## 2019-09-09 MED ORDER — HYDRALAZINE HCL 25 MG PO TABS: 25.0000 mg | ORAL_TABLET | Freq: Three times a day (TID) | ORAL | 0 refills | Status: DC

## 2019-09-09 MED ORDER — ACETAMINOPHEN 325 MG PO TABS
650.0000 mg | ORAL_TABLET | Freq: Once | ORAL | Status: DC
  Filled 2019-09-09: qty 2

## 2019-09-09 NOTE — Progress Notes (Signed)
Patient states he feels safe and comfortable returning home with no intention to harm himself. States relief that he has received help with medications and insurance information. PIV removed. Education completed. AVS printed and discussed with patient. All questions answered.   Tana Coast, RN

## 2019-09-09 NOTE — Discharge Summary (Signed)
Physician Discharge Summary  Mark Hayden HKV:[id number] DOB: 07-17-1975 DOA: 09/07/2019  PCP: Patient, No Pcp Per  Admit date: 09/07/2019 Discharge date: 09/09/2019  Recommendations for Outpatient Follow-up:   Hypertensive urgency, essential hypertension.  Started on Norvasc and hydralazine.  Mildly elevated transaminases.  No history of Tylenol use. --AST has normalized.  ALT only mildly elevated.  Hepatitis panel negative.  Follow-up as an outpatient.  Marijuana positive on UDS --Recommend cessation   Follow-up Information    [facility] Follow up on 10/05/2019.   Specialty: Internal Medicine Why: @ 1:20p bring photo ID, & meds. Contact information: [address] [place] [address] [phone]               Discharge Diagnoses: Principal diagnosis is #1 1. Hypertensive urgency 2. Suicidal thoughts 3. Acute kidney injury 4. Mildly elevated transaminases 5. Marijuana positive on UDS   Discharge Condition: improved Disposition: home  Diet recommendation: heart healthy  Filed Weights   09/08/19 0556 09/09/19 0409  Weight: 107 kg 110.2 kg    History of present illness:  44 year old man PMH essential hypertension not on medication secondary to financial issues, presented to the emergency department via mobile crisis for suicidal thoughts of hanging himself.  Found to be markedly hypertensive and therefore admitted for hypertensive urgency.  Hospital Course:  Patient was started on antihypertensives with improved blood pressure control.  Goal will be gradual reduction over the next several weeks.  He reports that he will be compliant with medication.  He is also seen by psychiatry, suicidal thoughts resolved, he was cleared for outpatient follow-up per psychiatry.  Discussed with case manager, and was set up for outpatient mental health resources, health insurance resources, PCP appointment, also contacted financial  counselor.  Hypertensive urgency, patient stopped medications approximately 4 years ago.  Mild headache.  CT head negative.  With associated hypertensive nephropathy, LVH on EKG, elevated troponin --Blood pressure better controlled and stable for discharge. --continue amlodipine, hydralazine  Hypokalemia, hypophosphatemia  --replete prior to discharge  Suicidal thoughts --Seen by psychiatry FNP as well as physician, not suicidal, homicidal or psychotic, not a candidate for inpatient psychiatric care. --Referral placed by psychiatry for behavioral health crisis center, given detailed crisis plan by psychiatry --Started on Lexapro  Acute kidney injury, resolved.  Likely secondary to hypertensive urgency.  Mildly elevated transaminases.  No history of Tylenol use. --AST has normalized.  ALT only mildly elevated.  Hepatitis panel negative.  Follow-up as an outpatient.  Marijuana positive on UDS --Recommend cessation  Consults:  . Psychiatry   Today's assessment: S: Feels better today.  Headache better.  Feels calmer today.  No suicidal ideation or desire to hurt or kill self. O: Vitals:  Vitals:   09/09/19 1430 09/09/19 1917  BP: (!) 190/126 (!) 177/88  Pulse:  91  Resp:  17  Temp:  99.4 F (37.4 C)  SpO2:  100%    Constitutional:  . Appears calm and comfortable Respiratory:  . CTA bilaterally, no w/r/r.  . Respiratory effort normal.  Cardiovascular:  . RRR, no m/r/g Psychiatric:  . Mental status o Mood, affect appropriate  Potassium 3.4, phosphorus 1.8, remainder CMP unremarkable except for ALT of 60  Discharge Instructions  Discharge Instructions    Diet - low sodium heart healthy   Complete by: As directed    Discharge instructions   Complete by: As directed    Call your physician or seek immediate medical attention for suicidal thoughts,  desire to hurt or kill self, or worsening of condition.   Increase activity slowly   Complete by: As directed        Allergies as of 09/09/2019      Reactions   Shellfish Allergy Anaphylaxis, Itching   seafood      Medication List    STOP taking these medications   pantoprazole 20 MG tablet Commonly known as: PROTONIX     TAKE these medications   amLODipine 5 MG tablet Commonly known as: NORVASC Take 1 tablet (5 mg total) by mouth daily. Start taking on: September 10, 2019   escitalopram 10 MG tablet Commonly known as: Lexapro Take 0.5 tablets (5 mg total) by mouth daily.   hydrALAZINE 25 MG tablet Commonly known as: APRESOLINE Take 1 tablet (25 mg total) by mouth 3 (three) times daily.      Allergies  Allergen Reactions  . Shellfish Allergy Anaphylaxis and Itching    seafood    The results of significant diagnostics from this hospitalization (including imaging, microbiology, ancillary and laboratory) are listed below for reference.    Significant Diagnostic Studies: CT Head Wo Contrast  Result Date: 09/08/2019 CLINICAL DATA:  Headache, intracranial hemorrhage suspected EXAM: CT HEAD WITHOUT CONTRAST TECHNIQUE: Contiguous axial images were obtained from the base of the skull through the vertex without intravenous contrast. COMPARISON:  None. FINDINGS: Brain: No intracranial hemorrhage, mass effect, or midline shift. No hydrocephalus. The basilar cisterns are patent. No evidence of territorial infarct or acute ischemia. No extra-axial or intracranial fluid collection. Vascular: No hyperdense vessel or unexpected calcification. Skull: Normal. Negative for fracture or focal lesion. Sinuses/Orbits: Minor mucosal thickening of ethmoid air cells. No sinus fluid level or acute finding. No mastoid effusion. Orbits are unremarkable. Other: None. IMPRESSION: Negative noncontrast head CT. Electronically Signed   By: Narda Rutherford M.D.   On: 09/08/2019 00:23   DG Chest Port 1 View  Result Date: 09/07/2019 CLINICAL DATA:  Elevated blood pressure and weakness. EXAM: PORTABLE CHEST 1 VIEW COMPARISON:   September 29, 2013 FINDINGS: There is no evidence of acute infiltrate, pleural effusion or pneumothorax. The heart size and mediastinal contours are within normal limits. The visualized skeletal structures are unremarkable. IMPRESSION: No active disease. Electronically Signed   By: Aram Candela M.D.   On: 09/07/2019 22:38    Microbiology: Recent Results (from the past 240 hour(s))  SARS Coronavirus 2 by RT PCR (hospital order, performed in [facility] hospital lab) Nasopharyngeal Nasopharyngeal Swab     Status: None   Collection Time: 09/07/19 10:25 PM   Specimen: Nasopharyngeal Swab  Result Value Ref Range Status   SARS Coronavirus 2 NEGATIVE NEGATIVE Final    Comment: (NOTE) SARS-CoV-2 target nucleic acids are NOT DETECTED.  The SARS-CoV-2 RNA is generally detectable in upper and lower respiratory specimens during the acute phase of infection. The lowest concentration of SARS-CoV-2 viral copies this assay can detect is 250 copies / mL. A negative result does not preclude SARS-CoV-2 infection and should not be used as the sole basis for treatment or other patient management decisions.  A negative result may occur with improper specimen collection / handling, submission of specimen other than nasopharyngeal swab, presence of viral mutation(s) within the areas targeted by this assay, and inadequate number of viral copies (<250 copies / mL). A negative result must be combined with clinical observations, patient history, and epidemiological information.  Fact Sheet for Patients:   BoilerBrush.com.cy  Fact Sheet for Healthcare Providers: https://pope.com/  This  test is not yet approved or  cleared by the [place] and has been authorized for detection and/or diagnosis of SARS-CoV-2 by FDA under an Emergency Use Authorization (EUA).  This EUA will remain in effect (meaning this test can be used) for the duration of the COVID-19  declaration under Section 564(b)(1) of the Act, 21 U.S.C. section 360bbb-3(b)(1), unless the authorization is terminated or revoked sooner.  Performed at [facility], [place] [address] [place], [place] [address]      Labs: Basic Metabolic Panel: Recent Labs  Lab 09/07/19 2215 09/08/19 0551 09/09/19 0624  NA 143 139 137  K 3.6 3.5 3.4*  CL 109 108 106  CO2 24 22 23   GLUCOSE 105* 103* 107*  BUN 20 17 17   CREATININE 1.60* 1.38* 1.20  CALCIUM 8.6* 8.6* 8.6*  MG  --   --  2.1  PHOS  --   --  1.8*   Liver Function Tests: Recent Labs  Lab 09/07/19 2215 09/08/19 0551 09/09/19 0624  AST 44* 36 29  ALT 63* 56* 60*  ALKPHOS 102 96 98  BILITOT 0.6 0.7 0.7  PROT 7.7 7.0 7.4  ALBUMIN 4.2 3.8 3.9   CBC: Recent Labs  Lab 09/07/19 2215 09/08/19 0551 09/09/19 0624  WBC 7.3 6.9 7.5  NEUTROABS 4.3 4.1 5.7  HGB 14.3 13.6 14.5  HCT 41.9 40.3 42.8  MCV 93.7 93.9 93.0  PLT 246 226 240    Principal Problem:   Hypertensive urgency Active Problems:   Suicidal thoughts   Time coordinating discharge: 45 minutes  Signed:  Murray Hodgkins, MD  [facility]  09/09/2019, 8:12 PM

## 2019-09-09 NOTE — Plan of Care (Signed)
  Problem: Education: Goal: Ability to make informed decisions regarding treatment will improve Outcome: Progressing   Problem: Coping: Goal: Coping ability will improve Outcome: Progressing   Problem: Health Behavior/Discharge Planning: Goal: Identification of resources available to assist in meeting health care needs will improve Outcome: Progressing   Problem: Medication: Goal: Compliance with prescribed medication regimen will improve Outcome: Progressing   Problem: Self-Concept: Goal: Ability to disclose and discuss suicidal ideas will improve Outcome: Progressing Goal: Will verbalize positive feelings about self Outcome: Progressing   

## 2019-09-09 NOTE — ED Notes (Signed)
Belongings given to sitter.

## 2019-09-09 NOTE — TOC Initial Note (Signed)
Transition of Care [facility]) - Initial/Assessment Note    Patient Details  Name: Mark Hayden MRN: [id number] Date of Birth: 1975/12/24  [facility] CM/SW Contact:    Lanier Clam, RN Phone Number: 09/09/2019, 2:21 PM  Clinical Narrative:  CM referral for otpt mental health resources/health insurance resources/pcp appt set.Also contacted financial counselor for no insurance-to screen for resources.Patient uses [facility] for meds.Patient voiced understanding of resources provided. Has own transportation. No further CM needs.                Expected Discharge Plan: Home/Self Care Barriers to Discharge: No Barriers Identified   Patient Goals and CMS Choice Patient states their goals for this hospitalization and ongoing recovery are:: go home CMS Medicare.gov Compare Post Acute Care list provided to:: Patient Choice offered to / list presented to : Patient  Expected Discharge Plan and Services Expected Discharge Plan: Home/Self Care   Discharge Planning Services: CM Consult Post Acute Care Choice:  (otpt mental health resources,health insurance resources.)   Expected Discharge Date: 09/09/19                                    Prior Living Arrangements/Services   Lives with:: Self Patient language and need for interpreter reviewed:: Yes Do you feel safe going back to the place where you live?: Yes      Need for Family Participation in Patient Care: No (Comment) Care giver support system in place?: Yes (comment)   Criminal Activity/Legal Involvement Pertinent to Current Situation/Hospitalization: No - Comment as needed  Activities of Daily Living Home Assistive Devices/Equipment: None ADL Screening (condition at time of admission) Patient's cognitive ability adequate to safely complete daily activities?: Yes Is the patient deaf or have difficulty hearing?: No Does the patient have difficulty seeing, even when wearing glasses/contacts?: No Does the  patient have difficulty concentrating, remembering, or making decisions?: Yes Patient able to express need for assistance with ADLs?: Yes Does the patient have difficulty dressing or bathing?: No Independently performs ADLs?: Yes (appropriate for developmental age) Does the patient have difficulty walking or climbing stairs?: No Weakness of Legs: None Weakness of Arms/Hands: None  Permission Sought/Granted Permission sought to share information with : Case Manager Permission granted to share information with : Yes, Verbal Permission Granted  Share Information with NAME: Case Manager           Emotional Assessment Appearance:: Appears stated age Attitude/Demeanor/Rapport: Gracious Affect (typically observed): Accepting Orientation: : Oriented to Self, Oriented to Place, Oriented to  Time, Oriented to Situation Alcohol / Substance Use: Tobacco Use Psych Involvement: Yes (comment)  Admission diagnosis:  Hypertensive urgency [I16.0] Suicidal ideations [R45.851] Patient Active Problem List   Diagnosis Date Noted  . Hypertensive urgency 09/08/2019  . Suicidal thoughts 09/08/2019  . Suicidal ideations   . AKI (acute kidney injury) (HCC)    PCP:  Patient, No Pcp Per Pharmacy:   [facility] [address] [place] - [address] [place] [address] Phone: [phone] Fax: [phone]     Social Determinants of Health (SDOH) Interventions    Readmission Risk Interventions No flowsheet data found.

## 2019-10-05 ENCOUNTER — Ambulatory Visit: Payer: Self-pay | Admitting: Nurse Practitioner

## 2021-11-03 ENCOUNTER — Emergency Department (HOSPITAL_BASED_OUTPATIENT_CLINIC_OR_DEPARTMENT_OTHER): Payer: Self-pay | Admitting: Radiology

## 2021-11-03 ENCOUNTER — Emergency Department (HOSPITAL_BASED_OUTPATIENT_CLINIC_OR_DEPARTMENT_OTHER)
Admission: EM | Admit: 2021-11-03 | Discharge: 2021-11-03 | Disposition: A | Discharge status: Home / Self Care | Payer: Self-pay | Attending: Emergency Medicine | Admitting: Emergency Medicine

## 2021-11-03 ENCOUNTER — Encounter (HOSPITAL_BASED_OUTPATIENT_CLINIC_OR_DEPARTMENT_OTHER): Payer: Self-pay

## 2021-11-03 ENCOUNTER — Other Ambulatory Visit: Payer: Self-pay

## 2021-11-03 DIAGNOSIS — S81812A Laceration without foreign body, left lower leg, initial encounter: Secondary | ICD-10-CM | POA: Insufficient documentation

## 2021-11-03 DIAGNOSIS — I10 Essential (primary) hypertension: Secondary | ICD-10-CM | POA: Insufficient documentation

## 2021-11-03 DIAGNOSIS — Z79899 Other long term (current) drug therapy: Secondary | ICD-10-CM | POA: Insufficient documentation

## 2021-11-03 DIAGNOSIS — Y92000 Kitchen of unspecified non-institutional (private) residence as  the place of occurrence of the external cause: Secondary | ICD-10-CM | POA: Insufficient documentation

## 2021-11-03 DIAGNOSIS — W268XXA Contact with other sharp object(s), not elsewhere classified, initial encounter: Secondary | ICD-10-CM | POA: Insufficient documentation

## 2021-11-03 MED ORDER — CEPHALEXIN 500 MG PO CAPS: 500.0000 mg | ORAL_CAPSULE | Freq: Four times a day (QID) | ORAL | 0 refills | Status: DC

## 2021-11-03 MED ORDER — LIDOCAINE-EPINEPHRINE (PF) 2 %-1:200000 IJ SOLN
20.0000 mL | Freq: Once | INTRAMUSCULAR | Status: AC
  Administered 2021-11-03: 20 mL
  Filled 2021-11-03: qty 20

## 2021-11-03 MED ORDER — SPIRONOLACTONE 25 MG PO TABS
12.5000 mg | ORAL_TABLET | Freq: Once | ORAL | Status: AC
  Administered 2021-11-03: 12.5 mg via ORAL
  Filled 2021-11-03: qty 1

## 2021-11-03 MED ORDER — AMLODIPINE BESYLATE 5 MG PO TABS
10.0000 mg | ORAL_TABLET | Freq: Once | ORAL | Status: AC
  Administered 2021-11-03: 10 mg via ORAL
  Filled 2021-11-03: qty 2

## 2021-11-03 MED ORDER — CARVEDILOL 12.5 MG PO TABS
12.5000 mg | ORAL_TABLET | Freq: Once | ORAL | Status: AC
  Administered 2021-11-03: 12.5 mg via ORAL
  Filled 2021-11-03: qty 1

## 2021-11-03 NOTE — Discharge Instructions (Signed)
Diagnosed with left left laceration. Closed laceration with 7 stiches. The lac is now well approximated. Please keep dry for 24 hours. Can clean with soap and water after 24 hours. Also orderd 5 day course of keflex for possible infection associated with laceration wound. Advise to follow up with PCP in 5 to 7 days for wound evaluation and suture removal. You BP was also high on arrival but did improve after I ordered your home BP meds. Return to the ED for further evaluation if leg pain worsens or new loss of sensation or range of motion in that leg.

## 2021-11-03 NOTE — ED Triage Notes (Addendum)
Patient here POV from Home.  Endorses sustaining a Laceration to Anterior Mid Left Leg after Falling through the Floor and lacerating it against Sharp Tile.   No Unknown Tetanus Status. No Anticoagulants. No Other Injuries. 4-5 cm Laceration. Bleeding Controlled at this Time.   NAD Noted During Triage. A&Ox4. GCS 15. BIB Wheelchair.

## 2021-11-03 NOTE — ED Provider Notes (Signed)
[facility] EMERGENCY DEPT Provider Note   CSN: [id number] Arrival date & time: 11/03/21  1436     History  Chief Complaint  Patient presents with   Laceration    Mark Hayden is a 46 y.o. male with HTN presenting for left lower leg laceration. Occurred a few ours ago. Was standing in the kitchen when the floor gave way and his leg went through the floor and scraped on the ceramic kitchen tile. Laceration is located midline just below knee of left leg an extends laterally. Stated that there was copious bleeding. Patient is unsure when last tetanus shot was and denies use of blood thinners. Applied home bandage which had soaked with blood upon arrival to the ED. Bandaged again with gauze and coban in triage.   Laceration      Home Medications Prior to Admission medications   Medication Sig Start Date End Date Taking? Authorizing Provider  cephALEXin (KEFLEX) 500 MG capsule Take 1 capsule (500 mg total) by mouth 4 (four) times daily. 11/03/21  Yes Gareth Eagle, PA-C  amLODipine (NORVASC) 5 MG tablet Take 1 tablet (5 mg total) by mouth daily. 09/10/19   Standley Brooking, MD  escitalopram (LEXAPRO) 10 MG tablet Take 0.5 tablets (5 mg total) by mouth daily. 09/09/19 10/09/19  Standley Brooking, MD  hydrALAZINE (APRESOLINE) 25 MG tablet Take 1 tablet (25 mg total) by mouth 3 (three) times daily. 09/09/19   Standley Brooking, MD      Allergies    Shellfish allergy    Review of Systems   Review of Systems  Skin:        Left lower leg laceration    Physical Exam Updated Vital Signs BP (!) 170/109   Pulse 74   Temp 98.1 F (36.7 C)   Resp 20   Ht 5\' 11"  (1.803 m)   Wt 110.2 kg   SpO2 100%   BMI 33.88 kg/m  Physical Exam Vitals and nursing note reviewed.  HENT:     Head: Normocephalic and atraumatic.     Mouth/Throat:     Mouth: Mucous membranes are moist.  Eyes:     General:        Right eye: No discharge.        Left eye: No discharge.      Conjunctiva/sclera: Conjunctivae normal.  Cardiovascular:     Rate and Rhythm: Normal rate and regular rhythm.     Pulses: Normal pulses.     Heart sounds: Normal heart sounds.  Pulmonary:     Effort: Pulmonary effort is normal.     Breath sounds: Normal breath sounds.  Abdominal:     General: Abdomen is flat.     Palpations: Abdomen is soft.  Skin:    General: Skin is warm and dry.     Comments: Noted soaked bandaged. After removal, visualized 5 cm laceration noted midline of left lower leg. Begins just below the knee and extends laterally. Laceration still bleeding. Bleeding appeared pulsatile.    Neurological:     General: No focal deficit present.  Psychiatric:        Mood and Affect: Mood normal.     ED Results / Procedures / Treatments   Labs (all labs ordered are listed, but only abnormal results are displayed) Labs Reviewed - No data to display  EKG None  Radiology DG Tibia/Fibula Left  Result Date: 11/03/2021 CLINICAL DATA:  Concern for foreign body. EXAM: LEFT TIBIA AND FIBULA - 2  VIEW COMPARISON:  None Available. FINDINGS: There is no evidence of fracture or other focal bone lesions. Small dystrophic calcification in the pretibial soft tissues. Soft tissues are unremarkable. IMPRESSION: No radiopaque foreign bodies. No acute osseous injury of the left tibia fibula. Electronically Signed   By: Elige Ko M.D.   On: 11/03/2021 17:59    Procedures .Marland KitchenLaceration Repair  Date/Time: 11/03/2021 7:39 PM  Performed by: Gareth Eagle, PA-C Authorized by: Gareth Eagle, PA-C   Consent:    Consent obtained:  Verbal   Risks discussed:  Infection, pain and retained foreign body Universal protocol:    Procedure explained and questions answered to patient or proxy's satisfaction: yes     Relevant documents present and verified: yes     Imaging studies available: yes     Patient identity confirmed:  Verbally with patient and arm band Anesthesia:    Anesthesia  method:  Local infiltration   Local anesthetic:  Lidocaine 2% WITH epi Laceration details:    Location:  Leg   Leg location:  L lower leg   Length (cm):  6   Depth (mm):  4 Pre-procedure details:    Preparation:  Patient was prepped and draped in usual sterile fashion and imaging obtained to evaluate for foreign bodies Exploration:    Hemostasis achieved with:  Epinephrine   Imaging obtained: x-ray     Imaging outcome: foreign body not noted     Wound extent: areolar tissue violated   Treatment:    Area cleansed with:  Saline   Amount of cleaning:  Extensive   Irrigation solution:  Sterile water   Irrigation method:  Pressure wash Skin repair:    Repair method:  Sutures   Suture size:  4-0   Suture material:  Prolene   Suture technique:  Simple interrupted   Number of sutures:  7 Approximation:    Approximation:  Close Repair type:    Repair type:  Simple Post-procedure details:    Dressing:  Non-adherent dressing   Procedure completion:  Tolerated well, no immediate complications     Medications Ordered in ED Medications  spironolactone (ALDACTONE) tablet 12.5 mg (12.5 mg Oral Given 11/03/21 1654)  amLODipine (NORVASC) tablet 10 mg (10 mg Oral Given 11/03/21 1653)  carvedilol (COREG) tablet 12.5 mg (12.5 mg Oral Given 11/03/21 1654)  lidocaine-EPINEPHrine (XYLOCAINE W/EPI) 2 %-1:200000 (PF) injection 20 mL (20 mLs Infiltration Given by Other 11/03/21 1805)    ED Course/ Medical Decision Making/ A&P                           Medical Decision Making Amount and/or Complexity of Data Reviewed Radiology: ordered.  Risk Prescription drug management.   This patient presents to the ED for concern of laceration, this involves a number of treatment options, and is a complaint that carries with it a moderate risk of complications and morbidity.  Differential diagnosis includes laceration, retained foreign object, and/or vessel injury    Co morbidities: Discussed in  HPI   EMR reviewed including pt PMHx, past surgical history and past visits to ER.   See HPI for more details   Lab Tests:  No labs indicated   Imaging Studies:  NAD. I personally reviewed all imaging studies and no acute abnormality found. I agree with radiology interpretation.    Cardiac Monitoring:  The patient was maintained on a cardiac monitor.  I personally viewed and interpreted the cardiac monitored which  showed an underlying rhythm of: NSR NA   Medicines ordered:  I ordered medication including lidocaine with epi for local anesthetic for lac repair. Also ordered home meds for hypertension. Ordered keflex of lac infection prophylaxis.  Reevaluation of the patient after these medicines showed that the patient improved I have reviewed the patients home medicines and have made adjustments as needed   Critical Interventions:  NA   Consults/Attending Physician   I discussed this case with my attending physician who cosigned this note including patient's presenting symptoms, physical exam, and planned diagnostics and interventions. Attending physician stated agreement with plan or made changes to plan which were implemented.   Reevaluation:  After the interventions noted above I re-evaluated patient and found that they have :improved    Problem List / ED Course:  Patient presented with left leg lac. Given mechanism, concerning for retained foreign body. Xray was negative. Applied local lido with epi, irrigated and cleaned with copious amounts of sterile water. Closed lac with 7 sutures. Lac was well approximated and patient tolerated procedure very well. Applied ABD bandage with ACE wrap. Discussed wound care at home and return precautions. Discharge with 5 day course of keflex for lac infection prophylaxis. Advised to f/u with PCP in 5 to 7 days for suture removal.   Patient was also hypertensive on arrival an reported that he did not take his BP meds.  Reordered home meds and BP improved.   Dispostion:  After consideration of the diagnostic results and the patients response to treatment, I feel that the patent would benefit from discharge home and f/u with PCP in 5 to 7 days for wound reeval and suture removal.          Final Clinical Impression(s) / ED Diagnoses Final diagnoses:  Laceration of left lower extremity, initial encounter  Hypertension, unspecified type    Rx / DC Orders ED Discharge Orders          Ordered    cephALEXin (KEFLEX) 500 MG capsule  4 times daily        11/03/21 1945              Gareth Eagle, PA-C 11/03/21 1949    Tegeler, Canary Brim, MD 11/04/21 0008

## 2021-11-03 NOTE — ED Notes (Signed)
Bleeding noted through Dressing applied in Triage. Acuity will be increased accordingly. NAD Noted from patient.

## 2021-11-03 NOTE — ED Notes (Signed)
Patient continues to bleed through the bandage.  Triage nurse notified and Chux placed under patients leg.

## 2021-12-24 IMAGING — CT CT HEAD W/O CM
3 series · 16 of 47 positions shown, 19 images · non-contrast
Comparison: None.

CLINICAL DATA: Headache, intracranial hemorrhage suspected

EXAM:
CT HEAD WITHOUT CONTRAST
TECHNIQUE: Contiguous axial images were obtained from the base of the skull
through the vertex without intravenous contrast.

[Series 2: head wo · axial · 0.47mm/px · z∈[+1600,+1735]mm · 10 of 33 slices shown, 13 images]
[im 3/33  brain]
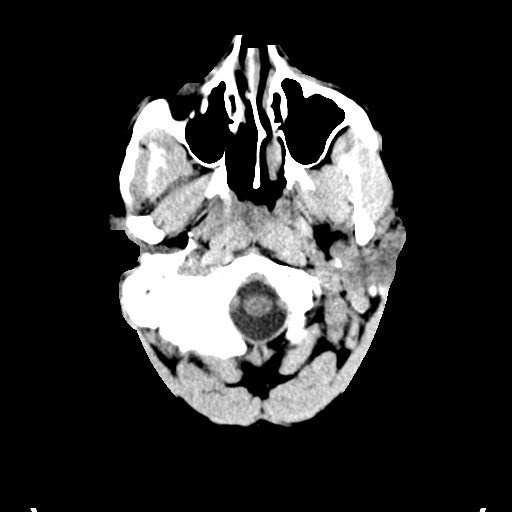
[im 3/33  bone]
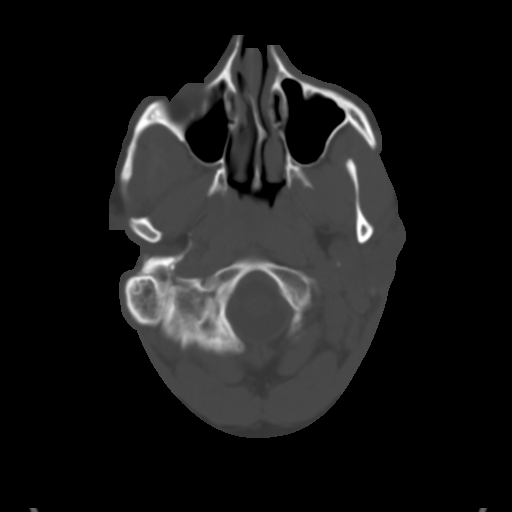
[im 6/33  brain]
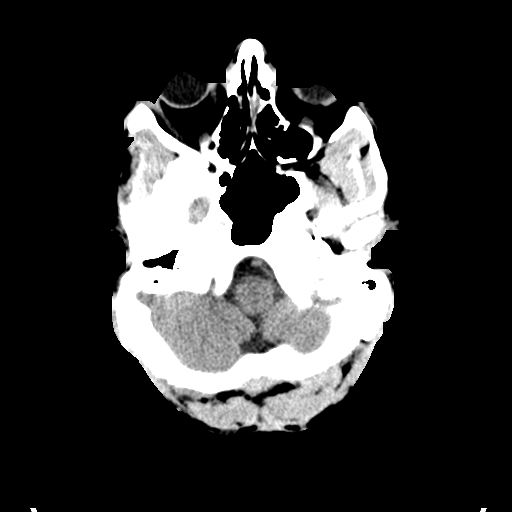
[im 9/33  brain]
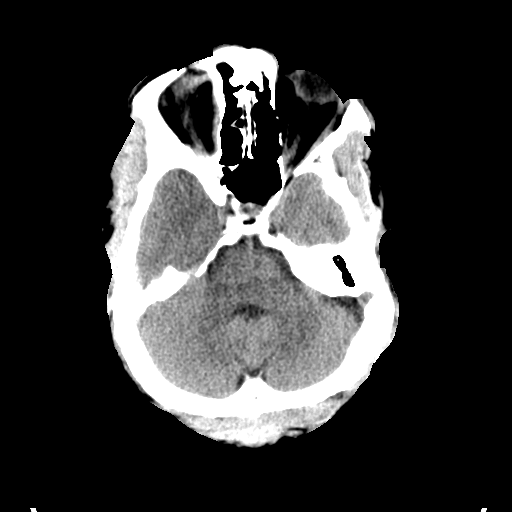
[im 12/33  brain]
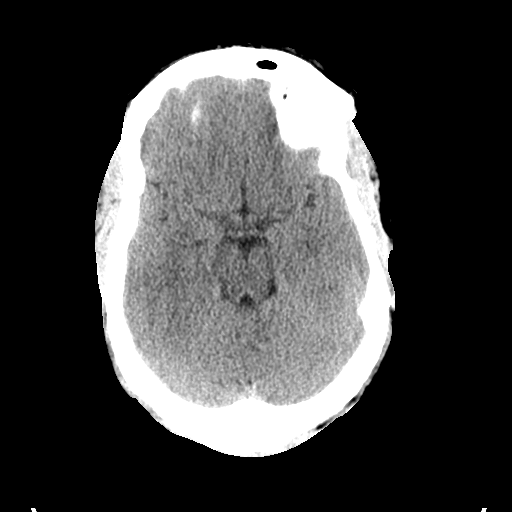
[im 15/33  brain]
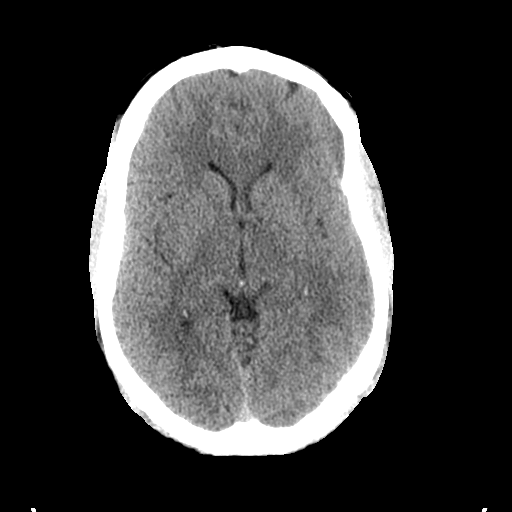
[im 15/33  bone]
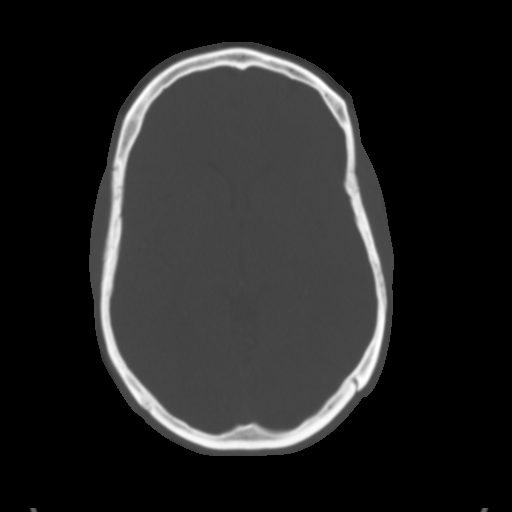
[im 18/33  brain]
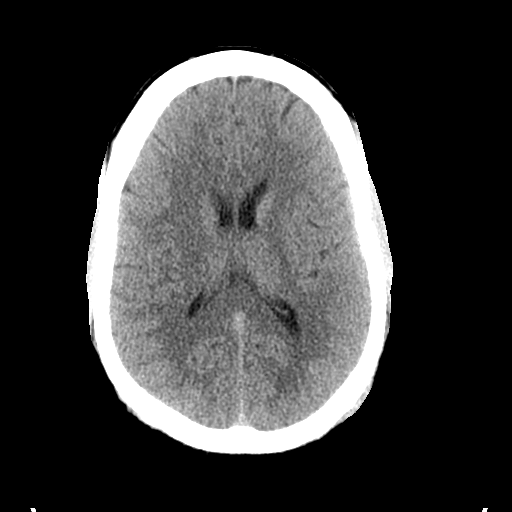
[im 21/33  brain]
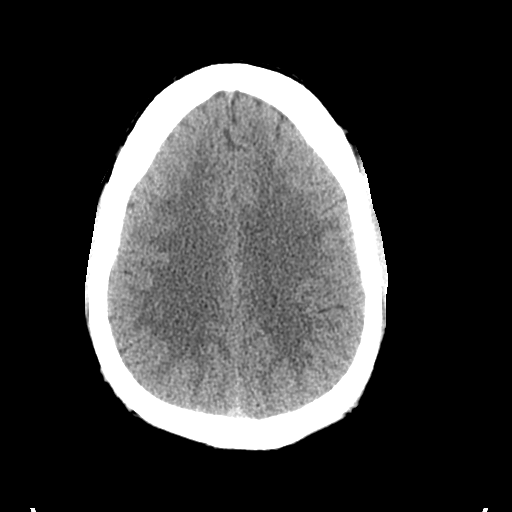
[im 25/33  brain]
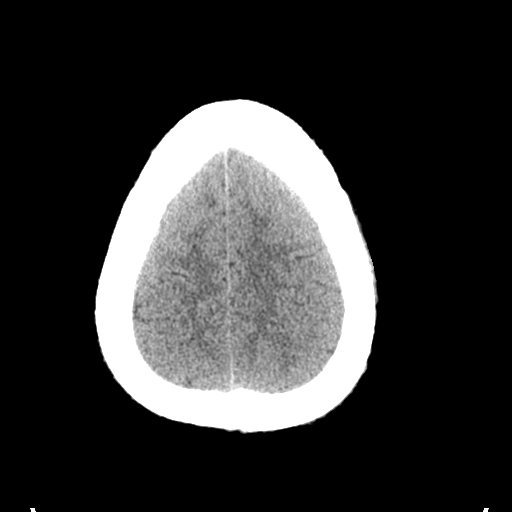
[im 27/33  brain]
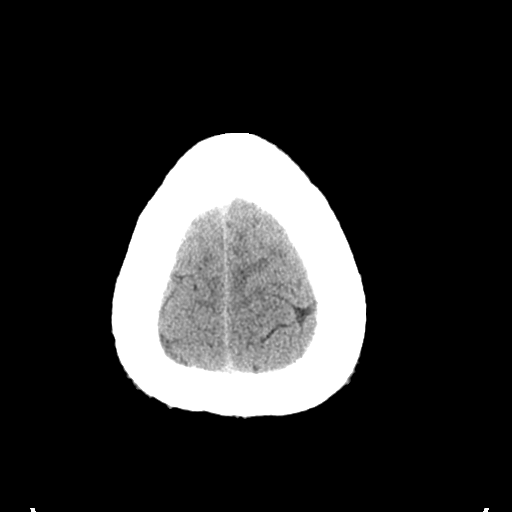
[im 27/33  bone]
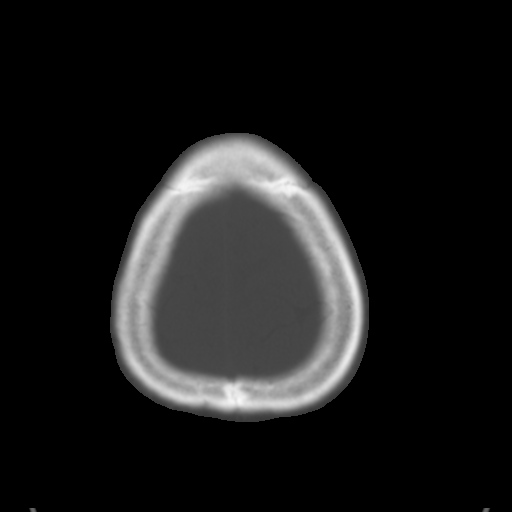
[im 30/33  brain]
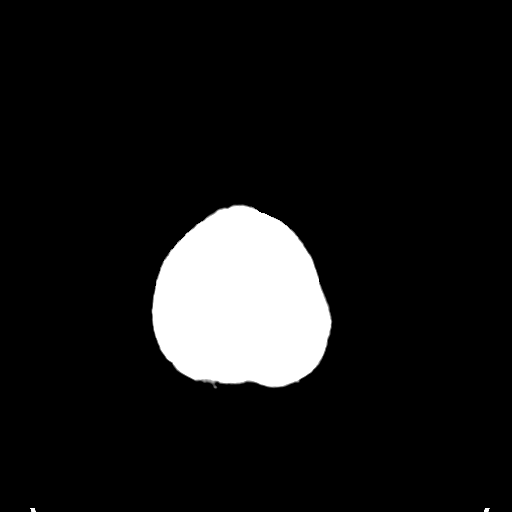

[Series 4: coronal soft tissue · coronal · 0.32mm/px · 3 of 70 slices shown]
[im 24/70  brain]
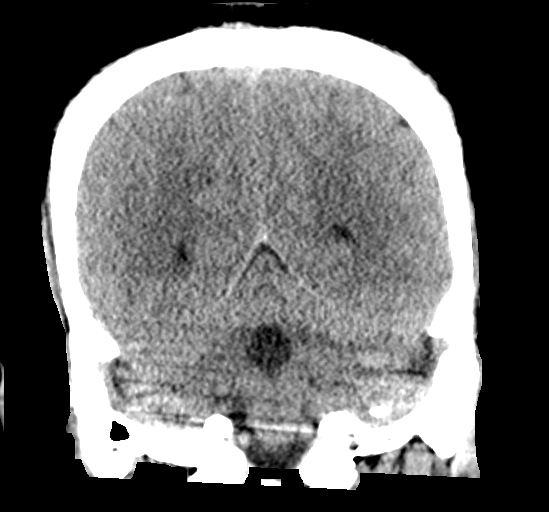
[im 31/70  brain]
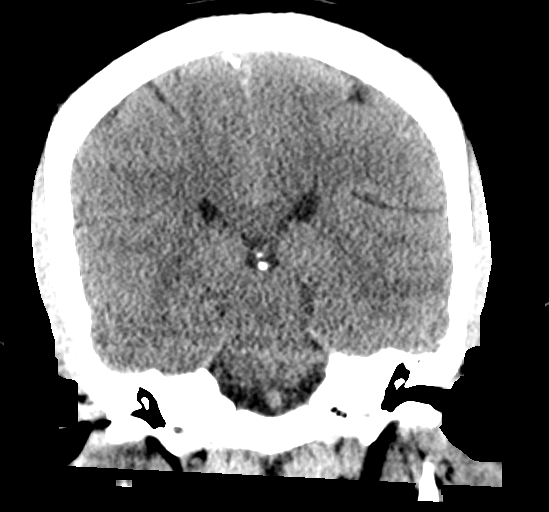
[im 39/70  brain]
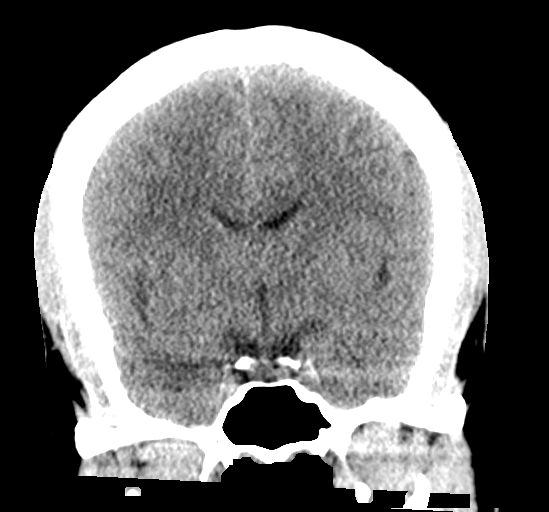

[Series 5: sagittal soft tissue · sagittal · 0.32mm/px · 3 of 60 slices shown]
[im 20/60  brain]
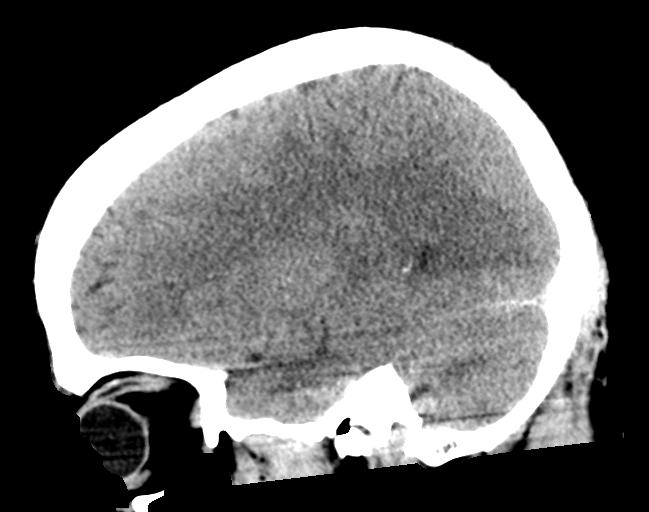
[im 30/60  brain]
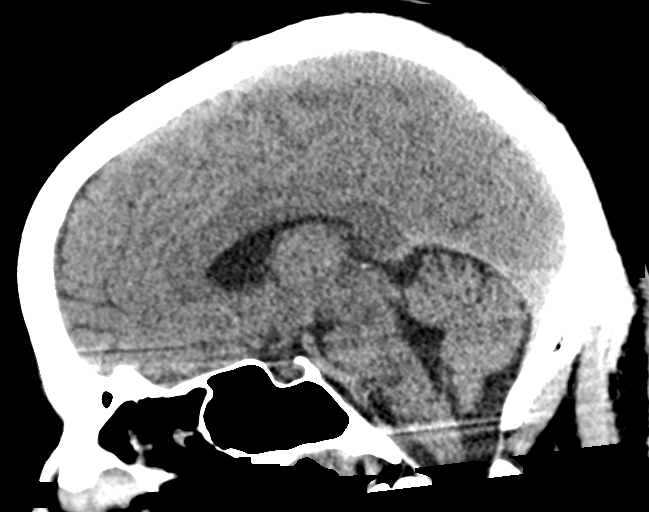
[im 40/60  brain]
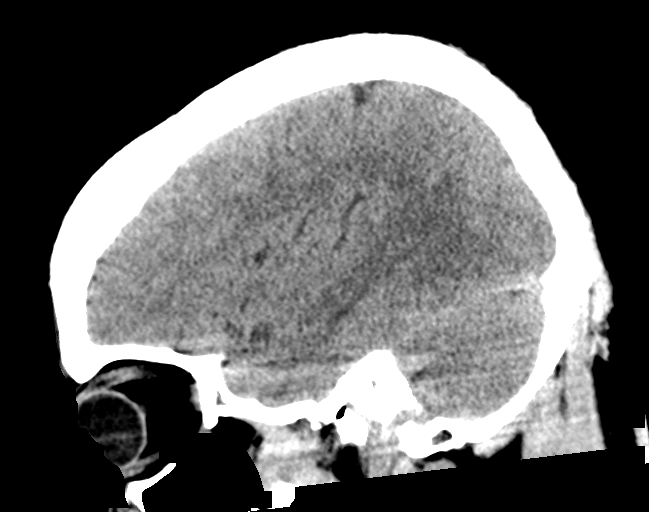

[16 of 47 positions shown; findings below may reference images not displayed]

FINDINGS: Brain: No intracranial hemorrhage, mass effect, or midline shift. No
hydrocephalus. The basilar cisterns are patent. No evidence of
territorial infarct or acute ischemia. No extra-axial or
intracranial fluid collection.

Vascular: No hyperdense vessel or unexpected calcification.

Skull: Normal. Negative for fracture or focal lesion.

Sinuses/Orbits: Minor mucosal thickening of ethmoid air cells. No
sinus fluid level or acute finding. No mastoid effusion. Orbits are
unremarkable.

Other: None.
IMPRESSION: Negative noncontrast head CT.

## 2022-08-17 ENCOUNTER — Other Ambulatory Visit: Payer: Self-pay

## 2022-08-17 ENCOUNTER — Inpatient Hospital Stay (HOSPITAL_COMMUNITY): Payer: Medicaid Other

## 2022-08-17 ENCOUNTER — Encounter (HOSPITAL_BASED_OUTPATIENT_CLINIC_OR_DEPARTMENT_OTHER): Payer: Self-pay | Admitting: Internal Medicine

## 2022-08-17 ENCOUNTER — Inpatient Hospital Stay (HOSPITAL_BASED_OUTPATIENT_CLINIC_OR_DEPARTMENT_OTHER)
Admission: EM | Admit: 2022-08-17 | Discharge: 2022-08-20 | DRG: 291 | Disposition: A | Discharge status: Home / Self Care | Payer: Medicaid Other | Attending: Internal Medicine | Admitting: Internal Medicine

## 2022-08-17 ENCOUNTER — Emergency Department (HOSPITAL_BASED_OUTPATIENT_CLINIC_OR_DEPARTMENT_OTHER): Payer: Medicaid Other

## 2022-08-17 DIAGNOSIS — R059 Cough, unspecified: Secondary | ICD-10-CM | POA: Diagnosis present

## 2022-08-17 DIAGNOSIS — Z91141 Patient's other noncompliance with medication regimen due to financial hardship: Secondary | ICD-10-CM | POA: Diagnosis not present

## 2022-08-17 DIAGNOSIS — T82848A Pain from vascular prosthetic devices, implants and grafts, initial encounter: Secondary | ICD-10-CM | POA: Diagnosis not present

## 2022-08-17 DIAGNOSIS — Z87891 Personal history of nicotine dependence: Secondary | ICD-10-CM

## 2022-08-17 DIAGNOSIS — Z79899 Other long term (current) drug therapy: Secondary | ICD-10-CM | POA: Diagnosis not present

## 2022-08-17 DIAGNOSIS — N184 Chronic kidney disease, stage 4 (severe): Secondary | ICD-10-CM | POA: Diagnosis present

## 2022-08-17 DIAGNOSIS — D509 Iron deficiency anemia, unspecified: Secondary | ICD-10-CM | POA: Insufficient documentation

## 2022-08-17 DIAGNOSIS — E876 Hypokalemia: Secondary | ICD-10-CM | POA: Diagnosis not present

## 2022-08-17 DIAGNOSIS — Z5986 Financial insecurity: Secondary | ICD-10-CM | POA: Diagnosis not present

## 2022-08-17 DIAGNOSIS — F1210 Cannabis abuse, uncomplicated: Secondary | ICD-10-CM | POA: Diagnosis not present

## 2022-08-17 DIAGNOSIS — N179 Acute kidney failure, unspecified: Secondary | ICD-10-CM | POA: Diagnosis not present

## 2022-08-17 DIAGNOSIS — I161 Hypertensive emergency: Secondary | ICD-10-CM | POA: Diagnosis not present

## 2022-08-17 DIAGNOSIS — Y848 Other medical procedures as the cause of abnormal reaction of the patient, or of later complication, without mention of misadventure at the time of the procedure: Secondary | ICD-10-CM | POA: Diagnosis not present

## 2022-08-17 DIAGNOSIS — N189 Chronic kidney disease, unspecified: Secondary | ICD-10-CM | POA: Diagnosis present

## 2022-08-17 DIAGNOSIS — I371 Nonrheumatic pulmonary valve insufficiency: Secondary | ICD-10-CM | POA: Diagnosis present

## 2022-08-17 DIAGNOSIS — Z419 Encounter for procedure for purposes other than remedying health state, unspecified: Secondary | ICD-10-CM | POA: Diagnosis not present

## 2022-08-17 DIAGNOSIS — I5033 Acute on chronic diastolic (congestive) heart failure: Secondary | ICD-10-CM | POA: Insufficient documentation

## 2022-08-17 DIAGNOSIS — Z1152 Encounter for screening for COVID-19: Secondary | ICD-10-CM

## 2022-08-17 DIAGNOSIS — E538 Deficiency of other specified B group vitamins: Secondary | ICD-10-CM | POA: Diagnosis present

## 2022-08-17 DIAGNOSIS — I340 Nonrheumatic mitral (valve) insufficiency: Secondary | ICD-10-CM | POA: Diagnosis present

## 2022-08-17 DIAGNOSIS — I5031 Acute diastolic (congestive) heart failure: Secondary | ICD-10-CM

## 2022-08-17 DIAGNOSIS — Z597 Insufficient social insurance and welfare support: Secondary | ICD-10-CM

## 2022-08-17 DIAGNOSIS — Z91013 Allergy to seafood: Secondary | ICD-10-CM | POA: Diagnosis not present

## 2022-08-17 DIAGNOSIS — Z833 Family history of diabetes mellitus: Secondary | ICD-10-CM

## 2022-08-17 DIAGNOSIS — E559 Vitamin D deficiency, unspecified: Secondary | ICD-10-CM | POA: Diagnosis present

## 2022-08-17 DIAGNOSIS — I509 Heart failure, unspecified: Secondary | ICD-10-CM

## 2022-08-17 DIAGNOSIS — I130 Hypertensive heart and chronic kidney disease with heart failure and stage 1 through stage 4 chronic kidney disease, or unspecified chronic kidney disease: Principal | ICD-10-CM | POA: Diagnosis present

## 2022-08-17 DIAGNOSIS — I2489 Other forms of acute ischemic heart disease: Secondary | ICD-10-CM | POA: Diagnosis present

## 2022-08-17 DIAGNOSIS — I5021 Acute systolic (congestive) heart failure: Secondary | ICD-10-CM | POA: Diagnosis not present

## 2022-08-17 LAB — CBC
HCT: 34.6 % — ABNORMAL LOW (ref 39.0–52.0)
HCT: 31.3 % — ABNORMAL LOW (ref 39.0–52.0)
Hemoglobin: 11.7 g/dL — ABNORMAL LOW (ref 13.0–17.0)
Hemoglobin: 10.4 g/dL — ABNORMAL LOW (ref 13.0–17.0)
MCH: 30.9 pg (ref 26.0–34.0)
MCH: 30.1 pg (ref 26.0–34.0)
MCHC: 33.8 g/dL (ref 30.0–36.0)
MCHC: 33.2 g/dL (ref 30.0–36.0)
MCV: 91.3 fL (ref 80.0–100.0)
MCV: 90.5 fL (ref 80.0–100.0)
Platelets: 206 10*3/uL (ref 150–400)
Platelets: 204 10*3/uL (ref 150–400)
RBC: 3.79 MIL/uL — ABNORMAL LOW (ref 4.22–5.81)
RBC: 3.46 MIL/uL — ABNORMAL LOW (ref 4.22–5.81)
RDW: 13.5 % (ref 11.5–15.5)
RDW: 13.3 % (ref 11.5–15.5)
WBC: 10 10*3/uL (ref 4.0–10.5)
WBC: 8.2 10*3/uL (ref 4.0–10.5)
nRBC: 0 % (ref 0.0–0.2)
nRBC: 0 % (ref 0.0–0.2)

## 2022-08-17 LAB — CREATININE, SERUM
Creatinine, Ser: 3.9 mg/dL — ABNORMAL HIGH (ref 0.61–1.24)
GFR, Estimated: 18 mL/min — ABNORMAL LOW (ref >60)

## 2022-08-17 LAB — BASIC METABOLIC PANEL
Anion gap: 11 (ref 5–15)
BUN: 43 mg/dL — ABNORMAL HIGH (ref 6–20)
CO2: 22 mmol/L (ref 22–32)
Calcium: 7.3 mg/dL — ABNORMAL LOW (ref 8.9–10.3)
Chloride: 108 mmol/L (ref 98–111)
Creatinine, Ser: 4.12 mg/dL — ABNORMAL HIGH (ref 0.61–1.24)
GFR, Estimated: 17 mL/min — ABNORMAL LOW (ref >60)
Glucose, Bld: 116 mg/dL — ABNORMAL HIGH (ref 70–99)
Potassium: 3.7 mmol/L (ref 3.5–5.1)
Sodium: 141 mmol/L (ref 135–145)

## 2022-08-17 LAB — URINALYSIS, COMPLETE (UACMP) WITH MICROSCOPIC
Bacteria, UA: NONE SEEN
Bilirubin Urine: NEGATIVE
Glucose, UA: NEGATIVE mg/dL
Ketones, ur: NEGATIVE mg/dL
Leukocytes,Ua: NEGATIVE
Nitrite: NEGATIVE
Protein, ur: NEGATIVE mg/dL
Specific Gravity, Urine: 1.009 (ref 1.005–1.030)
pH: 6 (ref 5.0–8.0)

## 2022-08-17 LAB — HEPATIC FUNCTION PANEL
ALT: 19 U/L (ref 0–44)
AST: 22 U/L (ref 15–41)
Albumin: 4.2 g/dL (ref 3.5–5.0)
Alkaline Phosphatase: 114 U/L (ref 38–126)
Bilirubin, Direct: <0.1 mg/dL (ref 0.0–0.2)
Total Bilirubin: 0.3 mg/dL (ref 0.3–1.2)
Total Protein: 7.2 g/dL (ref 6.5–8.1)

## 2022-08-17 LAB — TSH: TSH: 1.207 u[IU]/mL (ref 0.350–4.500)

## 2022-08-17 LAB — HIV ANTIBODY (ROUTINE TESTING W REFLEX): HIV Screen 4th Generation wRfx: NONREACTIVE

## 2022-08-17 LAB — BRAIN NATRIURETIC PEPTIDE: B Natriuretic Peptide: 363.8 pg/mL — ABNORMAL HIGH (ref 0.0–100.0)

## 2022-08-17 LAB — TROPONIN I (HIGH SENSITIVITY)
Troponin I (High Sensitivity): 117 ng/L (ref <18)
Troponin I (High Sensitivity): 107 ng/L (ref <18)

## 2022-08-17 MED ORDER — NITROGLYCERIN IN D5W 200-5 MCG/ML-% IV SOLN
0.0000 ug/min | INTRAVENOUS | Status: DC
  Administered 2022-08-17: 75 ug/min via INTRAVENOUS
  Filled 2022-08-17: qty 250

## 2022-08-17 MED ORDER — HEPARIN SODIUM (PORCINE) 5000 UNIT/ML IJ SOLN
5000.0000 [IU] | Freq: Three times a day (TID) | INTRAMUSCULAR | Status: DC
  Administered 2022-08-17 – 2022-08-20 (×10): 5000 [IU] via SUBCUTANEOUS
  Filled 2022-08-17 (×10): qty 1

## 2022-08-17 MED ORDER — ALBUTEROL SULFATE (2.5 MG/3ML) 0.083% IN NEBU: 2.5000 mg | INHALATION_SOLUTION | RESPIRATORY_TRACT | Status: DC | PRN

## 2022-08-17 MED ORDER — FUROSEMIDE 10 MG/ML IJ SOLN
60.0000 mg | Freq: Once | INTRAMUSCULAR | Status: AC
  Administered 2022-08-17: 60 mg via INTRAVENOUS
  Filled 2022-08-17: qty 6

## 2022-08-17 MED ORDER — ACETAMINOPHEN 325 MG PO TABS
650.0000 mg | ORAL_TABLET | Freq: Four times a day (QID) | ORAL | Status: DC | PRN
  Administered 2022-08-18 – 2022-08-20 (×2): 650 mg via ORAL
  Filled 2022-08-17 (×3): qty 2

## 2022-08-17 MED ORDER — ONDANSETRON HCL 4 MG PO TABS: 4.0000 mg | ORAL_TABLET | Freq: Four times a day (QID) | ORAL | Status: DC | PRN

## 2022-08-17 MED ORDER — LABETALOL HCL 5 MG/ML IV SOLN
10.0000 mg | Freq: Once | INTRAVENOUS | Status: AC
  Administered 2022-08-17: 10 mg via INTRAVENOUS
  Filled 2022-08-17: qty 4

## 2022-08-17 MED ORDER — FUROSEMIDE 10 MG/ML IJ SOLN
80.0000 mg | Freq: Two times a day (BID) | INTRAMUSCULAR | Status: DC
  Administered 2022-08-17: 80 mg via INTRAVENOUS
  Filled 2022-08-17: qty 8

## 2022-08-17 MED ORDER — SODIUM CHLORIDE 0.9 % IV SOLN: 250.0000 mL | INTRAVENOUS | Status: DC | PRN

## 2022-08-17 MED ORDER — SODIUM CHLORIDE 0.9% FLUSH: 3.0000 mL | INTRAVENOUS | Status: DC | PRN

## 2022-08-17 MED ORDER — ACETAMINOPHEN 650 MG RE SUPP: 650.0000 mg | Freq: Four times a day (QID) | RECTAL | Status: DC | PRN

## 2022-08-17 MED ORDER — HYDRALAZINE HCL 20 MG/ML IJ SOLN
10.0000 mg | Freq: Once | INTRAMUSCULAR | Status: AC
  Administered 2022-08-17: 10 mg via INTRAVENOUS
  Filled 2022-08-17: qty 1

## 2022-08-17 MED ORDER — SODIUM CHLORIDE 0.9% FLUSH
3.0000 mL | Freq: Two times a day (BID) | INTRAVENOUS | Status: DC
  Administered 2022-08-17 – 2022-08-20 (×6): 3 mL via INTRAVENOUS

## 2022-08-17 MED ORDER — POLYETHYLENE GLYCOL 3350 17 G PO PACK: 17.0000 g | PACK | Freq: Every day | ORAL | Status: DC | PRN

## 2022-08-17 MED ORDER — AMLODIPINE BESYLATE 5 MG PO TABS
10.0000 mg | ORAL_TABLET | Freq: Once | ORAL | Status: AC
  Administered 2022-08-17: 10 mg via ORAL
  Filled 2022-08-17: qty 2

## 2022-08-17 MED ORDER — HYDRALAZINE HCL 50 MG PO TABS
50.0000 mg | ORAL_TABLET | Freq: Three times a day (TID) | ORAL | Status: DC
  Administered 2022-08-17 – 2022-08-19 (×6): 50 mg via ORAL
  Filled 2022-08-17 (×6): qty 1

## 2022-08-17 MED ORDER — NITROGLYCERIN 2 % TD OINT: 1.0000 [in_us] | TOPICAL_OINTMENT | Freq: Once | TRANSDERMAL | Status: DC

## 2022-08-17 MED ORDER — ONDANSETRON HCL 4 MG/2ML IJ SOLN: 4.0000 mg | Freq: Four times a day (QID) | INTRAMUSCULAR | Status: DC | PRN

## 2022-08-17 MED ORDER — ISOSORBIDE MONONITRATE ER 30 MG PO TB24
30.0000 mg | ORAL_TABLET | Freq: Every day | ORAL | Status: DC
  Administered 2022-08-17 – 2022-08-19 (×3): 30 mg via ORAL
  Filled 2022-08-17 (×3): qty 1

## 2022-08-17 MED ORDER — LABETALOL HCL 5 MG/ML IV SOLN
10.0000 mg | INTRAVENOUS | Status: DC | PRN
  Administered 2022-08-18 – 2022-08-20 (×3): 10 mg via INTRAVENOUS
  Filled 2022-08-17 (×3): qty 4

## 2022-08-17 NOTE — H&P (Addendum)
History and Physical    Patient: Mark Hayden KGM:[id number] DOB: 03/16/1976 DOA: 08/17/2022 DOS: the patient was seen and examined on 08/17/2022 PCP: Patient, No Pcp Per  Patient coming from: Home  Chief Complaint:  Chief Complaint  Patient presents with   Shortness of Breath   HPI: Mark Hayden is a 47 y.o. male with medical history significant of history of poorly controlled hypertension due to noncompliance (financial issues), stage IV CKD, marijuana abuse-who presented to [facility] with shortness of breath.  Per patient-he ran out of his blood pressure medications approximately 2 weeks back.  Even when he was on his blood pressure medications-he was only taking them every few days-as he was trying to stretch them to last for longer.  Of time.  Approximately 2 days or so-he noticed some mild leg edema, and worsening shortness of breath.  Shortness of breath is mostly on exertion, he also claimed for the past 2 nights that he was having difficulty lying flat-and could not sleep.  He is somewhat of a poor historian-and was not able to elaborate any further.  He denies any hemoptysis but does have some intermittent cough.  He does not have chest pain.    He was evaluated at [facility]-where he was found to have blood pressure into the 230s systolic, chest x-ray was consistent with pulm edema, creatinine was elevated at 4.12.  He was started on a nitroglycerin drip, given diuretics-and subsequently transferred to [facility] on the [facility] service for further ongoing treatment/evaluation.  No headache initially but has started to develop a mild headache since being on a nitroglycerin infusion. No chest pain No nausea, vomiting, diarrhea, no abdominal pain No dysuria Per patient-he had bloody ejaculate when he masturbated yesterday.  However his urine is without hematuria this morning.   Review of Systems: As mentioned in the history of present illness. All other  systems reviewed and are negative. Past Medical History:  Diagnosis Date   Hypertension    History reviewed. No pertinent surgical history. Social History:  reports that he has quit smoking. He has never used smokeless tobacco. He reports that he does not drink alcohol and does not use drugs.  Allergies  Allergen Reactions   Shellfish Allergy Anaphylaxis and Itching    seafood    Family History  Problem Relation Age of Onset   Diabetes Mother    Diabetes Father     Prior to Admission medications   Medication Sig Start Date End Date Taking? Authorizing Provider  amLODipine (NORVASC) 5 MG tablet Take 1 tablet (5 mg total) by mouth daily. 09/10/19   Standley Brooking, MD  carvedilol (COREG) 12.5 MG tablet Take 12.5 mg by mouth 2 (two) times daily with a meal.    [provider]  cephALEXin (KEFLEX) 500 MG capsule Take 1 capsule (500 mg total) by mouth 4 (four) times daily. 11/03/21   Gareth Eagle, PA-C  escitalopram (LEXAPRO) 10 MG tablet Take 0.5 tablets (5 mg total) by mouth daily. 09/09/19 10/09/19  Standley Brooking, MD  hydrALAZINE (APRESOLINE) 25 MG tablet Take 1 tablet (25 mg total) by mouth 3 (three) times daily. 09/09/19   Standley Brooking, MD  spironolactone (ALDACTONE) 50 MG tablet Take 50 mg by mouth daily.    [provider]    Physical Exam: Vitals:   08/17/22 1131 08/17/22 1300 08/17/22 1438 08/17/22 1536  BP: (!) 160/89 (!) 164/84 (!) 178/108 (!) 171/113  Pulse: 88 84 86 90  Resp: 17 18 18 20   Temp: 98.2 F (36.8 C)   98.1 F (36.7 C)  TempSrc: Oral   Oral  SpO2: 95% 97% 97% 98%  Weight:    93.8 kg  Height:    5\' 11"  (1.803 m)   Gen Exam:Alert awake-not in any distress HEENT:atraumatic, normocephalic Chest: B/L clear to auscultation anteriorly CVS:S1S2 regular Abdomen:soft non tender, non distended Extremities:no edema Neurology: Non focal Skin: no rash  Data Reviewed:    Latest Ref Rng & Units 08/17/2022    5:56 AM 09/09/2019     6:24 AM 09/08/2019    5:51 AM  CBC  WBC 4.0 - 10.5 K/uL 10.0  7.5  6.9   Hemoglobin 13.0 - 17.0 g/dL 16.1  09.6  04.5   Hematocrit 39.0 - 52.0 % 34.6  42.8  40.3   Platelets 150 - 400 K/uL 206  240  226         Latest Ref Rng & Units 08/17/2022    5:56 AM 09/09/2019    6:24 AM 09/08/2019    5:51 AM  BMP  Glucose 70 - 99 mg/dL 409  811  914   BUN 6 - 20 mg/dL 43  17  17   Creatinine 0.61 - 1.24 mg/dL 7.82  9.56  2.13   Sodium 135 - 145 mmol/L 141  137  139   Potassium 3.5 - 5.1 mmol/L 3.7  3.4  3.5   Chloride 98 - 111 mmol/L 108  106  108   CO2 22 - 32 mmol/L 22  23  22    Calcium 8.9 - 10.3 mg/dL 7.3  8.6  8.6     CXR: Personally reviewed-pulm edema  Assessment and Plan: Hypertensive emergency Secondary to noncompliance to antihypertensives SBP down to the 170s-180s during my evaluation-will start hydralazine/Imdur/diuretic regimen/low-dose beta-blockers and attempt to titrate off nitroglycerin infusion.    Acute on chronic HFpEF Provoked by uncontrolled hypertension Mild leg edema-most of his volume is in his lungs Lasix 80 mg IV twice daily Echocardiogram ordered Strict intake/output  Minimally elevated troponin levels Demand ischemia in the setting of CHF/uncontrolled hypertension Checking echocardiogram-if EF preserved-no wall motion-doubt further workup required  AKI versus progression of known CKD 4 Reviewed care everywhere-prior creatinine around 3.0 (August 2023) Unclear whether this is AKI or progression of CKD 4 due to uncontrolled hypertension Check UA Renal ultrasound Nephrology consulted  HTN Suspect this is primary hypertension-he is still relatively young-and apparently was diagnosed several years back-although he is very vague as to when he was diagnosed Unclear whether he has had workup for secondary causes in the past Plan is to control BP as outlined above-and follow-up with PCP/nephrology in the outpatient setting where further workup for secondary  causes can be contemplated.  Bloody ejaculate with masturbation on 5/30 Urine is clear this morning-no obvious hematuria Unclear significance Check UA to see if he has hematuria. Urology eval in the outpatient setting  Medication noncompliance This is due to financial issues TOC consult  Marijuana use Counseled   Advance Care Planning:   Code Status: Full Code   Consults: Renal  Family Communication: None at bedside  Severity of Illness: The appropriate patient status for this patient is INPATIENT. Inpatient status is judged to be reasonable and necessary in order to provide the required intensity of service to ensure the patient's safety. The patient's presenting symptoms, physical exam findings, and initial radiographic and laboratory data in the context of their chronic comorbidities is felt to place  them at high risk for further clinical deterioration. Furthermore, it is not anticipated that the patient will be medically stable for discharge from the hospital within 2 midnights of admission.   * I certify that at the point of admission it is my clinical judgment that the patient will require inpatient hospital care spanning beyond 2 midnights from the point of admission due to high intensity of service, high risk for further deterioration and high frequency of surveillance required.*  Author: Jeoffrey Massed, MD 08/17/2022 4:52 PM  For on call review [email or web address].

## 2022-08-17 NOTE — ED Notes (Addendum)
Keep nitro drip at same.

## 2022-08-17 NOTE — ED Notes (Signed)
Urinal provided for pt convenience 

## 2022-08-17 NOTE — ED Notes (Signed)
ED Provider at bedside. 

## 2022-08-17 NOTE — ED Provider Notes (Signed)
[place] EMERGENCY DEPARTMENT AT [facility] Provider Note  CSN: [id number] Arrival date & time: 08/17/22 0536  Chief Complaint(s) No chief complaint on file.  HPI Mark Hayden is a 47 y.o. male with a past medical history listed below including hypertension who presents to the emergency department with 2 days of gradually worsening shortness of breath and chest discomfort.  Patient is having dyspnea on exertion and endorsing mostly orthopnea.  He is having mild bilateral lower extremity edema.  States that he ran out of his blood pressure medication 1 to 2 weeks ago.  States that before that he was rationing his medication and missing doses.  He denies any headache or focal deficits.  No nausea or vomiting.  Patient does have a dry cough.  No fevers or chills.  No other physical complaints  The history is provided by the patient.    Past Medical History Past Medical History:  Diagnosis Date  . Hypertension    Patient Active Problem List   Diagnosis Date Noted  . Hypertensive urgency 09/08/2019  . Suicidal thoughts 09/08/2019  . Suicidal ideations   . AKI (acute kidney injury) (HCC)    Home Medication(s) Prior to Admission medications   Medication Sig Start Date End Date Taking? Authorizing Provider  amLODipine (NORVASC) 5 MG tablet Take 1 tablet (5 mg total) by mouth daily. 09/10/19  Yes Standley Brooking, MD  carvedilol (COREG) 12.5 MG tablet Take 12.5 mg by mouth 2 (two) times daily with a meal.   Yes [provider]  spironolactone (ALDACTONE) 50 MG tablet Take 50 mg by mouth daily.   Yes [provider]  cephALEXin (KEFLEX) 500 MG capsule Take 1 capsule (500 mg total) by mouth 4 (four) times daily. 11/03/21   Gareth Eagle, PA-C  escitalopram (LEXAPRO) 10 MG tablet Take 0.5 tablets (5 mg total) by mouth daily. 09/09/19 10/09/19  Standley Brooking, MD  hydrALAZINE (APRESOLINE) 25 MG tablet Take 1 tablet (25 mg total) by mouth 3 (three)  times daily. 09/09/19   Standley Brooking, MD                                                                                                                                    Allergies Shellfish allergy  Review of Systems Review of Systems As noted in HPI  Physical Exam Vital Signs  I have reviewed the triage vital signs BP (!) 169/96   Pulse 80   Temp 98.5 F (36.9 C) (Oral)   Resp 19   Ht 5\' 11"  (1.803 m)   Wt 105 kg   SpO2 95%   BMI 32.29 kg/m   Physical Exam Vitals reviewed.  Constitutional:      General: He is not in acute distress.    Appearance: He is well-developed. He is not diaphoretic.  HENT:     Head: Normocephalic and atraumatic.     Nose: Nose  normal.  Eyes:     General: No scleral icterus.       Right eye: No discharge.        Left eye: No discharge.     Conjunctiva/sclera: Conjunctivae normal.     Pupils: Pupils are equal, round, and reactive to light.  Cardiovascular:     Rate and Rhythm: Normal rate and regular rhythm.     Heart sounds: No murmur heard.    No friction rub. No gallop.  Pulmonary:     Effort: Pulmonary effort is normal. Tachypnea present. No respiratory distress.     Breath sounds: No stridor. Examination of the right-middle field reveals rales. Examination of the right-lower field reveals rales. Examination of the left-lower field reveals rales. Rales present.  Abdominal:     General: There is no distension.     Palpations: Abdomen is soft.     Tenderness: There is no abdominal tenderness.  Musculoskeletal:        General: No tenderness.     Cervical back: Normal range of motion and neck supple.     Right lower leg: 1+ Pitting Edema present.     Left lower leg: 1+ Pitting Edema present.  Skin:    General: Skin is warm and dry.     Findings: No erythema or rash.  Neurological:     Mental Status: He is alert and oriented to person, place, and time.     ED Results and Treatments Labs (all labs ordered are listed, but only  abnormal results are displayed) Labs Reviewed  BASIC METABOLIC PANEL - Abnormal; Notable for the following components:      Result Value   Glucose, Bld 116 (*)    BUN 43 (*)    Creatinine, Ser 4.12 (*)    Calcium 7.3 (*)    GFR, Estimated 17 (*)    All other components within normal limits  CBC - Abnormal; Notable for the following components:   RBC 3.79 (*)    Hemoglobin 11.7 (*)    HCT 34.6 (*)    All other components within normal limits  BRAIN NATRIURETIC PEPTIDE - Abnormal; Notable for the following components:   B Natriuretic Peptide 363.8 (*)    All other components within normal limits  TROPONIN I (HIGH SENSITIVITY) - Abnormal; Notable for the following components:   Troponin I (High Sensitivity) 117 (*)    All other components within normal limits  HEPATIC FUNCTION PANEL                                                                                                                         EKG  EKG Interpretation  Date/Time:  Friday Aug 17 2022 05:43:10 EDT Ventricular Rate:  105 PR Interval:  159 QRS Duration: 96 QT Interval:  358 QTC Calculation: 474 R Axis:   57 Text Interpretation: Sinus tachycardia LAE, consider biatrial enlargement LVH with secondary repolarization abnormality Anterior ST elevation, probably due to LVH Confirmed by Masha Orbach,  Irish Piech [phone]) on 08/17/2022 6:04:47 AM       Radiology DG Chest Port 1 View  Result Date: 08/17/2022 CLINICAL DATA:  [id number] with shortness of breath for 2 days, worse when lying flat. Ran out of BP meds 1.5 weeks ago. EXAM: PORTABLE CHEST 1 VIEW COMPARISON:  Portable chest 09/07/2019 FINDINGS: There is mild cardiomegaly with increased vascular engorgement, flow cephalization, and increased central and basilar interstitial edema with small pleural effusions. Perihilar hazy opacities are also noted on the right greater than left and may be due to ground-glass edema. Underlying pneumonia would be difficult to exclude given the  asymmetry. The mediastinum is normally outlined. Thoracic cage is intact. Numerous overlying monitor wires are present. IMPRESSION: 1. Cardiomegaly with increased vascular engorgement, interstitial edema and small pleural effusions consistent with CHF or fluid overload. 2. Perihilar hazy opacities on the right greater than left may be due to ground-glass edema or pneumonia. Electronically Signed   By: Almira Bar M.D.   On: 08/17/2022 06:42    Medications Ordered in ED Medications  nitroGLYCERIN 50 mg in dextrose 5 % 250 mL (0.2 mg/mL) infusion (40 mcg/min Intravenous Rate/Dose Change 08/17/22 0652)  furosemide (LASIX) injection 60 mg (has no administration in time range)  hydrALAZINE (APRESOLINE) injection 10 mg (10 mg Intravenous Given 08/17/22 0646)  labetalol (NORMODYNE) injection 10 mg (10 mg Intravenous Given 08/17/22 0648)   Procedures .1-3 Lead EKG Interpretation  Performed by: Nira Conn, MD Authorized by: Nira Conn, MD     Interpretation: normal     ECG rate:  89   ECG rate assessment: normal     Rhythm: sinus rhythm     Ectopy: none     Conduction: normal   .Critical Care  Performed by: Nira Conn, MD Authorized by: Nira Conn, MD   Critical care provider statement:    Critical care time (minutes):  45   Critical care time was exclusive of:  Separately billable procedures and treating other patients   Critical care was necessary to treat or prevent imminent or life-threatening deterioration of the following conditions:  Cardiac failure   Critical care was time spent personally by me on the following activities:  Development of treatment plan with patient or surrogate, discussions with consultants, evaluation of patient's response to treatment, examination of patient, obtaining history from patient or surrogate, review of old charts, re-evaluation of patient's condition, pulse oximetry, ordering and review of radiographic studies,  ordering and review of laboratory studies and ordering and performing treatments and interventions   (including critical care time) Medical Decision Making / ED Course   Medical Decision Making Amount and/or Complexity of Data Reviewed Labs: ordered. Decision-making details documented in ED Course. Radiology: ordered and independent interpretation performed. Decision-making details documented in ED Course. ECG/medicine tests: ordered and independent interpretation performed. Decision-making details documented in ED Course.  Risk Prescription drug management. Decision regarding hospitalization.     Clinical Course as of 08/17/22 0724  Fri Aug 17, 2022  9811 Chest pain and shortness of breath  Noted to be significantly hypertensive.  Tachypneic but not hypoxic.  Presentation is most suspicious for hypertensive emergency.  Will assess for evidence of heart failure and possible ACS.  Will rule out pneumothorax, pneumonia.  Doubt pulmonary embolism.  Will check for electrolyte metabolic derangements, renal insufficiency, and/or severe anemia.  [PC]  [id number] Patient started on nitroglycerin drip  [PC]  0617 EKG with evidence of LVH.  Not consistent with STEMI.  Chest  x-ray with evidence of pulmonary edema.  CBC without leukocytosis.  Mild anemia  [PC]  0640 BMP without significant electrolyte derangements.  Patient had his AKI with a creatinine of 4.1, up from 1.2 two years ago. No transaminitis.  Trop 117 likely from HTN demand. BNP 363. [PC]  0642 BP stable on of NTG.  Will give IV lasix, hydralazine and labetalol. Titrate off of NTG.  Consulting hospitalist service to admit [PC]  [phone] Home medicines include Coreg, Amlodipine,and Spironolactone.  Will give amlodipine dose per hospitalist request. [PC]    Clinical Course User Index [PC] Macrae Wiegman, Amadeo Garnet, MD    Final Clinical Impression(s) / ED Diagnoses Final diagnoses:  Hypertensive emergency  Acute renal  failure, unspecified acute renal failure type [facility])    This chart was dictated using voice recognition software.  Despite best efforts to proofread,  errors can occur which can change the documentation meaning.    Nira Conn, MD 08/17/22 [phone]

## 2022-08-17 NOTE — ED Notes (Signed)
RT assessed pt for SOB upon arrival. Pt respiratory status stable w/tachypnea and WOB. Pt BLBS clear uppers/ clr dim lowers.

## 2022-08-17 NOTE — ED Notes (Signed)
Called [facility] -- informed that pt bed assignment is ready 

## 2022-08-17 NOTE — ED Triage Notes (Addendum)
Pt reports shortness of breath for about 2 days, worse to lie flat. Out of his medications (spirolactone, bp meds)  for about 1.5 weeks. Diaphoretic and tachypnea on arrival to room. Pain in the center of his chest.

## 2022-08-17 NOTE — Plan of Care (Signed)
  Problem: Education: Goal: Knowledge of General Education information will improve Description: Including pain rating scale, medication(s)/side effects and non-pharmacologic comfort measures Outcome: Progressing   Problem: Health Behavior/Discharge Planning: Goal: Ability to manage health-related needs will improve Outcome: Progressing   Problem: Clinical Measurements: Goal: Ability to maintain clinical measurements within normal limits will improve Outcome: Progressing Goal: Will remain free from infection Outcome: Progressing Goal: Diagnostic test results will improve Outcome: Progressing   Problem: Activity: Goal: Risk for activity intolerance will decrease Outcome: Progressing   Problem: Coping: Goal: Level of anxiety will decrease Outcome: Progressing   

## 2022-08-17 NOTE — Progress Notes (Signed)
Approximately 1545--Pt arrived to [id number] via [facility]. Upon arrival, nitro gtt running at 60 mcg/min. VS obtained and full assessment completed by this RN. Pt connected to telemetry and telemetry notified. Pt current BP is 171/113 MAP 121 asymptomatic. MD Ghirmire made aware. RN to await titration until orders placed per MD.

## 2022-08-17 NOTE — Consult Note (Signed)
[facility] Renal Consultation Note  Requesting MD: Ghimire Indication for Consultation: progressive CKD, hypertensive urgency   HPI:  Mark Hayden is a 47 y.o. male with poorly controlled HTN secondary to non compliance which is mostly financially motivated.   He presented with SOB-  SBP 230- cxr c/w pulm edema- crt 4.12.  There is some information from another facility which showed in Aurgust of 23 crt was around 3 when he had hospitalization for basically the same issue.  Currently SBP 150-170 making plenty of urine-  crt later 3.9 -  renal US has been ordered.  Urinalysis is negative.  He reports feeling better-  admitted that his UOP had not been great the last couple of days but seems to be getting better.  Does not seem to be uremic- has only a small amount of edema   Creatinine, Ser  Date/Time Value Ref Range Status  08/17/2022 05:02 PM 3.90 (H) 0.61 - 1.24 mg/dL Final  16/12/9602 54:09 AM 4.12 (H) 0.61 - 1.24 mg/dL Final  81/19/1478 29:56 AM 1.20 0.61 - 1.24 mg/dL Final  21/30/8657 84:69 AM 1.38 (H) 0.61 - 1.24 mg/dL Final  62/95/2841 32:44 PM 1.60 (H) 0.61 - 1.24 mg/dL Final  03/21/7251 66:44 PM 1.02 0.50 - 1.35 mg/dL Final  03/47/4259 56:38 PM 0.97 0.50 - 1.35 mg/dL Final     PMHx:   Past Medical History:  Diagnosis Date   Hypertension     History reviewed. No pertinent surgical history.  Family Hx:  Family History  Problem Relation Age of Onset   Diabetes Mother    Diabetes Father     Social History:  reports that he has quit smoking. He has never used smokeless tobacco. He reports that he does not drink alcohol and does not use drugs.  Allergies:  Allergies  Allergen Reactions   Shellfish Allergy Anaphylaxis and Itching    seafood    Medications: Prior to Admission medications   Medication Sig Start Date End Date Taking? Authorizing Provider  amLODipine (NORVASC) 5 MG tablet Take 1 tablet (5 mg total) by mouth daily. 09/10/19    Standley Brooking, MD  carvedilol (COREG) 12.5 MG tablet Take 12.5 mg by mouth 2 (two) times daily with a meal.    [provider]  cephALEXin (KEFLEX) 500 MG capsule Take 1 capsule (500 mg total) by mouth 4 (four) times daily. 11/03/21   Gareth Eagle, PA-C  escitalopram (LEXAPRO) 10 MG tablet Take 0.5 tablets (5 mg total) by mouth daily. 09/09/19 10/09/19  Standley Brooking, MD  hydrALAZINE (APRESOLINE) 25 MG tablet Take 1 tablet (25 mg total) by mouth 3 (three) times daily. 09/09/19   Standley Brooking, MD  spironolactone (ALDACTONE) 50 MG tablet Take 50 mg by mouth daily.    [provider]    I have reviewed the patient's current medications.  Labs:  Results for orders placed or performed during the hospital encounter of 08/17/22 (from the past 48 hour(s))  Basic metabolic panel     Status: Abnormal   Collection Time: 08/17/22  5:56 AM  Result Value Ref Range   Sodium 141 135 - 145 mmol/L   Potassium 3.7 3.5 - 5.1 mmol/L   Chloride 108 98 - 111 mmol/L   CO2 22 22 - 32 mmol/L   Glucose, Bld 116 (H) 70 - 99 mg/dL    Comment: Glucose reference range applies only to samples taken after fasting for at least 8 hours.   BUN 43 (  H) 6 - 20 mg/dL   Creatinine, Ser 1.61 (H) 0.61 - 1.24 mg/dL   Calcium 7.3 (L) 8.9 - 10.3 mg/dL   GFR, Estimated 17 (L) >60 mL/min    Comment: (NOTE) Calculated using the CKD-EPI Creatinine Equation (2021)    Anion gap 11 5 - 15    Comment: Performed at [facility], [address] [place], [place] [address]  CBC     Status: Abnormal   Collection Time: 08/17/22  5:56 AM  Result Value Ref Range   WBC 10.0 4.0 - 10.5 K/uL   RBC 3.79 (L) 4.22 - 5.81 MIL/uL   Hemoglobin 11.7 (L) 13.0 - 17.0 g/dL   HCT 54.0 (L) 98.1 - 19.1 %   MCV 91.3 80.0 - 100.0 fL   MCH 30.9 26.0 - 34.0 pg   MCHC 33.8 30.0 - 36.0 g/dL   RDW 47.8 29.5 - 62.1 %   Platelets 206 150 - 400 K/uL   nRBC 0.0 0.0 - 0.2 %    Comment: Performed at [facility], [address] [place], [place] [address]  Troponin I (High Sensitivity)     Status: Abnormal   Collection Time: 08/17/22  5:56 AM  Result Value Ref Range   Troponin I (High Sensitivity) 117 (HH) <18 ng/L    Comment: CRITICAL RESULT CALLED TO, READ BACK BY AND VERIFIED WITH: CALLED TO Kerrie Pleasure [phone] 08/17/22 BY KENYATTA BOWMAN (NOTE) Elevated high sensitivity troponin I (hsTnI) values and significant  changes across serial measurements may suggest ACS but many other  chronic and acute conditions are known to elevate hsTnI results.  Refer to the Links section for chest pain algorithms and additional  guidance. Performed at [facility], [address], [place], [place] [address]   Hepatic function panel     Status: None   Collection Time: 08/17/22  6:01 AM  Result Value Ref Range   Total Protein 7.2 6.5 - 8.1 g/dL   Albumin 4.2 3.5 - 5.0 g/dL   AST 22 15 - 41 U/L   ALT 19 0 - 44 U/L   Alkaline Phosphatase 114 38 - 126 U/L   Total Bilirubin 0.3 0.3 - 1.2 mg/dL   Bilirubin, Direct <2.8 0.0 - 0.2 mg/dL   Indirect Bilirubin NOT CALCULATED 0.3 - 0.9 mg/dL    Comment: Performed at [facility], [address], [place], [place] [address]  Brain natriuretic peptide     Status: Abnormal   Collection Time: 08/17/22  6:01 AM  Result Value Ref Range   B Natriuretic Peptide 363.8 (H) 0.0 - 100.0 pg/mL    Comment: Performed at [facility], [address] [place], [place] [address]  Troponin I (High Sensitivity)     Status: Abnormal   Collection Time: 08/17/22  8:06 AM  Result Value Ref Range   Troponin I (High Sensitivity) 107 (HH) <18 ng/L    Comment: CRITICAL VALUE NOTED.  VALUE IS CONSISTENT WITH PREVIOUSLY REPORTED AND CALLED VALUE. (NOTE) Elevated high sensitivity troponin I (hsTnI) values and significant  changes across serial measurements may suggest ACS but many other  chronic and acute  conditions are known to elevate hsTnI results.  Refer to the Links section for chest pain algorithms and additional  guidance. Performed at [facility], [address] [place], [place] [address]   CBC     Status: Abnormal   Collection Time: 08/17/22  5:02 PM  Result Value Ref Range   WBC  8.2 4.0 - 10.5 K/uL   RBC 3.46 (L) 4.22 - 5.81 MIL/uL   Hemoglobin 10.4 (L) 13.0 - 17.0 g/dL   HCT 96.0 (L) 45.4 - 09.8 %   MCV 90.5 80.0 - 100.0 fL   MCH 30.1 26.0 - 34.0 pg   MCHC 33.2 30.0 - 36.0 g/dL   RDW 11.9 14.7 - 82.9 %   Platelets 204 150 - 400 K/uL   nRBC 0.0 0.0 - 0.2 %    Comment: Performed at [facility], [address], [place], [place] [address]  Creatinine, serum     Status: Abnormal   Collection Time: 08/17/22  5:02 PM  Result Value Ref Range   Creatinine, Ser 3.90 (H) 0.61 - 1.24 mg/dL   GFR, Estimated 18 (L) >60 mL/min    Comment: (NOTE) Calculated using the CKD-EPI Creatinine Equation (2021) Performed at [facility], [address] [place], [place] [address]      ROS:  A comprehensive review of systems was negative except for: Cardiovascular: positive for dyspnea and lower extremity edema when he first came in but not now  Physical Exam: Vitals:   08/17/22 1438 08/17/22 1536  BP: (!) 178/108 (!) 171/113  Pulse: 86 90  Resp: 18 20  Temp:  98.1 F (36.7 C)  SpO2: 97% 98%     General: thin BM-  really NAD right now HEENT: PERRLA, EOMI, mucous membranes moist  Neck: no JVD Heart:RRR  Lungs:  now seems clear-  on room air Abdomen: soft , non tender Extremities: really minimal edema Skin: warm and dry Neuro: nonfocal  Assessment/Plan: 47 year old BM-  poorly controlled BP for years-  now with AKi in the setting of hypertensive urgency 1.Renal- history of AKI/CKD in the setting of hypertensive urgency in August of 23.  Has not had BP well controlled since unfortunately and now presents with another episode of hypertensive  urgency.  Prmary team has done an excellent job of managing pts BP-  is better and really where we want it right now- 160-170 systolic.  I am going to stop the high dose lasix however because volume status seems much better and I wonder if his UOP wont just improve with better BP.  Urine is bland-  [place] to be done-  can help with kidney prognosis 2. Hypertension/volume  - excellent management to date-  now off ntg drip-  started on norvasc 10, hydralazine 50 and imdur.  Have stopped lasix since volume status seems so much better-  to get echo.  Main objective is to get him on BP reg that is simple enough for him to keep up with and afford.  This is his best chance to protect kidneys in the future 3. Anemia  - not a major issue    Cecille Aver 08/17/2022, 5:44 PM

## 2022-08-17 NOTE — ED Notes (Signed)
[place] with [facility] requested for pt to get labetelol, spoke with Anitra Lauth, EDP and she stated to send pt on nitro drop and medications could be addressed at receiving hospital

## 2022-08-17 NOTE — Progress Notes (Signed)
TRH admission transfer acceptance note  Transferring facility[facility] Transferring MD: Dr. Nira Conn Accepting MD: Dr. Marcellus Scott Accepted to: [facility] Progressive bed, IP  47 year old male with PMH of HTN, substance abuse/THC, medication noncompliance, presented to the ED on 08/17/2022 with complaints of progressively worsening dyspnea, chest discomfort, orthopnea, lower extremity edema after he ran out of his BP medications 1 to 2 weeks ago.  In the ED, initial BP of 233/148, tachypneic at 26 but not hypoxic on room air.  Clinical features consistent with CHF.  Lab work significant for BUN 43, creatinine 4.12 (1.2 in June 2021), BNP 364, troponin 117, hemoglobin 11.7, chest x-ray suggestive of CHF, EKG shows mild sinus tachycardia, LVH with repolarization abnormalities, elevated J-point in leads V2-3 but no acute changes.  Assessment and plan: Hypertensive emergency: EDP started patient on NTG drip with improved control.  Has given as needed IV hydralazine and labetalol with aims to wean off NTG drip.  Discussed with EDP and suggested restarting home doses of antihypertensives after verifying which may include amlodipine and hydralazine.  However to hold off on resuming carvedilol given acute CHF.  Acute CHF: No echo in [facility] or [facility].  Could have either systolic, diastolic or combined CHF due to poorly controlled hypertension.  Will need echo.  IV Lasix 60 mg x 1 has been ordered.  May need cardiology consultation.  Acute renal failure: Creatinine as noted above.  May be progressive CKD versus acute on chronic kidney disease from poorly controlled hypertension.  Will need further evaluation including renal ultrasound and may be nephrology consultation as well.  Medication noncompliance: Reportedly due to lack of insurance.  Will need TOC consultation.  Substance abuse: UDS.  On arrival to [facility], patient will need evaluation by admitting provider,  H&P and admit orders.  Marcellus Scott, MD,  FACP, FHM, [facility], [facility], [facility]   [facility] [place]     To contact the attending provider between 7A-7P or the covering provider during after hours 7P-7A, please log into the web site [email or web address] and access using universal [place] password for that web site. If you do not have the password, please call the hospital operator.

## 2022-08-18 ENCOUNTER — Inpatient Hospital Stay (HOSPITAL_COMMUNITY): Payer: Medicaid Other

## 2022-08-18 DIAGNOSIS — I130 Hypertensive heart and chronic kidney disease with heart failure and stage 1 through stage 4 chronic kidney disease, or unspecified chronic kidney disease: Secondary | ICD-10-CM | POA: Diagnosis present

## 2022-08-18 DIAGNOSIS — I2489 Other forms of acute ischemic heart disease: Secondary | ICD-10-CM | POA: Diagnosis present

## 2022-08-18 DIAGNOSIS — I340 Nonrheumatic mitral (valve) insufficiency: Secondary | ICD-10-CM | POA: Diagnosis present

## 2022-08-18 DIAGNOSIS — Y848 Other medical procedures as the cause of abnormal reaction of the patient, or of later complication, without mention of misadventure at the time of the procedure: Secondary | ICD-10-CM | POA: Diagnosis not present

## 2022-08-18 DIAGNOSIS — E559 Vitamin D deficiency, unspecified: Secondary | ICD-10-CM | POA: Diagnosis present

## 2022-08-18 DIAGNOSIS — N184 Chronic kidney disease, stage 4 (severe): Secondary | ICD-10-CM | POA: Diagnosis present

## 2022-08-18 DIAGNOSIS — Z1152 Encounter for screening for COVID-19: Secondary | ICD-10-CM | POA: Diagnosis not present

## 2022-08-18 DIAGNOSIS — R059 Cough, unspecified: Secondary | ICD-10-CM | POA: Diagnosis present

## 2022-08-18 DIAGNOSIS — D509 Iron deficiency anemia, unspecified: Secondary | ICD-10-CM | POA: Diagnosis present

## 2022-08-18 DIAGNOSIS — I5021 Acute systolic (congestive) heart failure: Secondary | ICD-10-CM | POA: Diagnosis not present

## 2022-08-18 DIAGNOSIS — T82848A Pain from vascular prosthetic devices, implants and grafts, initial encounter: Secondary | ICD-10-CM | POA: Diagnosis not present

## 2022-08-18 DIAGNOSIS — Z91013 Allergy to seafood: Secondary | ICD-10-CM | POA: Diagnosis not present

## 2022-08-18 DIAGNOSIS — I371 Nonrheumatic pulmonary valve insufficiency: Secondary | ICD-10-CM | POA: Diagnosis present

## 2022-08-18 DIAGNOSIS — Z79899 Other long term (current) drug therapy: Secondary | ICD-10-CM | POA: Diagnosis not present

## 2022-08-18 DIAGNOSIS — I161 Hypertensive emergency: Secondary | ICD-10-CM | POA: Diagnosis present

## 2022-08-18 DIAGNOSIS — Z833 Family history of diabetes mellitus: Secondary | ICD-10-CM | POA: Diagnosis not present

## 2022-08-18 DIAGNOSIS — E876 Hypokalemia: Secondary | ICD-10-CM | POA: Diagnosis not present

## 2022-08-18 DIAGNOSIS — I5031 Acute diastolic (congestive) heart failure: Secondary | ICD-10-CM

## 2022-08-18 DIAGNOSIS — I5033 Acute on chronic diastolic (congestive) heart failure: Secondary | ICD-10-CM | POA: Diagnosis present

## 2022-08-18 DIAGNOSIS — Z87891 Personal history of nicotine dependence: Secondary | ICD-10-CM | POA: Diagnosis not present

## 2022-08-18 DIAGNOSIS — Z91141 Patient's other noncompliance with medication regimen due to financial hardship: Secondary | ICD-10-CM | POA: Diagnosis not present

## 2022-08-18 DIAGNOSIS — Z419 Encounter for procedure for purposes other than remedying health state, unspecified: Secondary | ICD-10-CM | POA: Diagnosis not present

## 2022-08-18 DIAGNOSIS — E538 Deficiency of other specified B group vitamins: Secondary | ICD-10-CM | POA: Diagnosis present

## 2022-08-18 DIAGNOSIS — Z597 Insufficient social insurance and welfare support: Secondary | ICD-10-CM | POA: Diagnosis not present

## 2022-08-18 DIAGNOSIS — F1210 Cannabis abuse, uncomplicated: Secondary | ICD-10-CM | POA: Diagnosis present

## 2022-08-18 DIAGNOSIS — N179 Acute kidney failure, unspecified: Secondary | ICD-10-CM | POA: Diagnosis present

## 2022-08-18 DIAGNOSIS — Z5986 Financial insecurity: Secondary | ICD-10-CM | POA: Diagnosis not present

## 2022-08-18 LAB — RENAL FUNCTION PANEL
Albumin: 3.5 g/dL (ref 3.5–5.0)
Anion gap: 12 (ref 5–15)
BUN: 37 mg/dL — ABNORMAL HIGH (ref 6–20)
CO2: 20 mmol/L — ABNORMAL LOW (ref 22–32)
Calcium: 6.8 mg/dL — ABNORMAL LOW (ref 8.9–10.3)
Chloride: 106 mmol/L (ref 98–111)
Creatinine, Ser: 3.74 mg/dL — ABNORMAL HIGH (ref 0.61–1.24)
GFR, Estimated: 19 mL/min — ABNORMAL LOW (ref >60)
Glucose, Bld: 108 mg/dL — ABNORMAL HIGH (ref 70–99)
Phosphorus: 3 mg/dL (ref 2.5–4.6)
Potassium: 2.8 mmol/L — ABNORMAL LOW (ref 3.5–5.1)
Sodium: 138 mmol/L (ref 135–145)

## 2022-08-18 LAB — ECHOCARDIOGRAM COMPLETE
AR max vel: 2.89 cm2
AV Area VTI: 2.98 cm2
AV Area mean vel: 2.81 cm2
AV Mean grad: 5 mmHg
AV Peak grad: 8.4 mmHg
Ao pk vel: 1.45 m/s
Area-P 1/2: 5.62 cm2
Calc EF: 44.6 %
Height: 71 in
MV M vel: 5.3 m/s
MV Peak grad: 112.1 mmHg
Radius: 0.3 cm
S' Lateral: 3.4 cm
Single Plane A2C EF: 49.7 %
Single Plane A4C EF: 42.3 %
Weight: 3219.2 oz

## 2022-08-18 LAB — BASIC METABOLIC PANEL
Anion gap: 9 (ref 5–15)
BUN: 42 mg/dL — ABNORMAL HIGH (ref 6–20)
CO2: 22 mmol/L (ref 22–32)
Calcium: 6.9 mg/dL — ABNORMAL LOW (ref 8.9–10.3)
Chloride: 105 mmol/L (ref 98–111)
Creatinine, Ser: 4.01 mg/dL — ABNORMAL HIGH (ref 0.61–1.24)
GFR, Estimated: 18 mL/min — ABNORMAL LOW (ref >60)
Glucose, Bld: 137 mg/dL — ABNORMAL HIGH (ref 70–99)
Potassium: 3.3 mmol/L — ABNORMAL LOW (ref 3.5–5.1)
Sodium: 136 mmol/L (ref 135–145)

## 2022-08-18 LAB — IRON AND TIBC
Iron: 17 ug/dL — ABNORMAL LOW (ref 45–182)
Saturation Ratios: 6 % — ABNORMAL LOW (ref 17.9–39.5)
TIBC: 277 ug/dL (ref 250–450)
UIBC: 260 ug/dL

## 2022-08-18 LAB — VITAMIN D 25 HYDROXY (VIT D DEFICIENCY, FRACTURES): Vit D, 25-Hydroxy: 5.12 ng/mL — ABNORMAL LOW (ref 30–100)

## 2022-08-18 LAB — MAGNESIUM
Magnesium: 1.3 mg/dL — ABNORMAL LOW (ref 1.7–2.4)
Magnesium: 1.8 mg/dL (ref 1.7–2.4)

## 2022-08-18 LAB — VITAMIN B12: Vitamin B-12: 82 pg/mL — ABNORMAL LOW (ref 180–914)

## 2022-08-18 LAB — HEMOGLOBIN A1C
Hgb A1c MFr Bld: 5.5 % (ref 4.8–5.6)
Mean Plasma Glucose: 111 mg/dL

## 2022-08-18 LAB — FOLATE: Folate: 10.7 ng/mL (ref >5.9)

## 2022-08-18 MED ORDER — CALCIUM CARBONATE 1250 (500 CA) MG PO TABS
1.0000 | ORAL_TABLET | Freq: Two times a day (BID) | ORAL | Status: DC
  Administered 2022-08-18 – 2022-08-20 (×5): 1250 mg via ORAL
  Filled 2022-08-18 (×5): qty 1

## 2022-08-18 MED ORDER — POTASSIUM CHLORIDE CRYS ER 20 MEQ PO TBCR
40.0000 meq | EXTENDED_RELEASE_TABLET | Freq: Once | ORAL | Status: AC
  Administered 2022-08-18: 40 meq via ORAL
  Filled 2022-08-18: qty 2

## 2022-08-18 MED ORDER — POTASSIUM CHLORIDE CRYS ER 20 MEQ PO TBCR
20.0000 meq | EXTENDED_RELEASE_TABLET | Freq: Two times a day (BID) | ORAL | Status: DC
  Administered 2022-08-18 – 2022-08-19 (×3): 20 meq via ORAL
  Filled 2022-08-18 (×4): qty 1

## 2022-08-18 MED ORDER — METOPROLOL TARTRATE 50 MG PO TABS
50.0000 mg | ORAL_TABLET | Freq: Two times a day (BID) | ORAL | Status: AC
  Administered 2022-08-18 – 2022-08-19 (×4): 50 mg via ORAL
  Filled 2022-08-18 (×4): qty 1

## 2022-08-18 MED ORDER — SODIUM CHLORIDE 0.9 % IV SOLN
200.0000 mg | Freq: Every day | INTRAVENOUS | Status: DC
  Administered 2022-08-18 – 2022-08-20 (×3): 200 mg via INTRAVENOUS
  Filled 2022-08-18 (×3): qty 10

## 2022-08-18 MED ORDER — SPIRONOLACTONE 25 MG PO TABS
25.0000 mg | ORAL_TABLET | Freq: Every evening | ORAL | Status: DC
  Administered 2022-08-18 – 2022-08-19 (×2): 25 mg via ORAL
  Filled 2022-08-18 (×2): qty 1

## 2022-08-18 MED ORDER — MAGNESIUM SULFATE 2 GM/50ML IV SOLN
2.0000 g | Freq: Once | INTRAVENOUS | Status: AC
  Administered 2022-08-18: 2 g via INTRAVENOUS
  Filled 2022-08-18: qty 50

## 2022-08-18 MED ORDER — VITAMIN D (ERGOCALCIFEROL) 1.25 MG (50000 UNIT) PO CAPS
50000.0000 [IU] | ORAL_CAPSULE | ORAL | Status: DC
  Administered 2022-08-18: 50000 [IU] via ORAL
  Filled 2022-08-18: qty 1

## 2022-08-18 MED ORDER — CYANOCOBALAMIN 1000 MCG/ML IJ SOLN
1000.0000 ug | Freq: Every day | INTRAMUSCULAR | Status: DC
  Administered 2022-08-18 – 2022-08-20 (×3): 1000 ug via INTRAMUSCULAR
  Filled 2022-08-18 (×4): qty 1

## 2022-08-18 MED ORDER — VITAMIN B-12 1000 MCG PO TABS: 1000.0000 ug | ORAL_TABLET | Freq: Every day | ORAL | Status: DC

## 2022-08-18 NOTE — Plan of Care (Signed)

## 2022-08-18 NOTE — Progress Notes (Signed)
Subjective:  BP still high on hydral 50 tid, isosorbide 30 daily, lopressor 50 BID, amlod stopped-  kidney function thankfully trending better-  k is low -   had echo LVH not surprisingly and EF 45-50  U/S right kidney 8.7, left 9.9- says echogenicity normal   Objective Vital signs in last 24 hours: Vitals:   08/18/22 0030 08/18/22 0526 08/18/22 0745 08/18/22 0943  BP: (!) 161/92 (!) 171/116 (!) 185/110 (!) 163/108  Pulse: 93 93 98 93  Resp: 18 18 17 18   Temp: 98.3 F (36.8 C) 98.3 F (36.8 C) 99.2 F (37.3 C)   TempSrc: Oral Oral Oral   SpO2: 100% 100% 100%   Weight:  91.3 kg    Height:       Weight change: -11.2 kg  Intake/Output Summary (Last 24 hours) at 08/18/2022 1135 Last data filed at 08/18/2022 1015 Gross per 24 hour  Intake 1310.46 ml  Output 3175 ml  Net -1864.54 ml     Assessment/Plan: 47 year old BM-  poorly controlled BP for years-  now with AKi in the setting of hypertensive urgency 1.Renal- history of AKI/CKD in the setting of hypertensive urgency in August of 23.  Has not had BP well controlled since unfortunately and now presents with another episode of hypertensive urgency.  Prmary team has done an excellent job of managing pts BP-  was better and really where we want it right now- 160-170 systolic.  I am going to stop the high dose lasix however because volume status seems much better and I wonder if his UOP wont just improve with better BP.  Urine is bland-  [place] pretty negative 2. Hypertension/volume  - excellent management initially-  now off ntg drip-   norvasc 10 stopped ?  hydralazine 50 tid and imdur and lopressor..  Have stopped lasix since volume status seems better  echo does show lvh and dec ef.  Main objective is to get him on BP reg that is simple enough for him to keep up with and afford.  This is his best chance to protect kidneys in the future.  With this in mind I would favor the amlodipine for the future or just an extended release toprol. Now with the  low K this brings to mind hyperaldo-  wiill add some aldactone on at night  3. Anemia  - not a major issue    Cecille Aver    Labs: Basic Metabolic Panel: Recent Labs  Lab 08/17/22 0556 08/17/22 1702 08/18/22 0052  NA 141  --  138  K 3.7  --  2.8*  CL 108  --  106  CO2 22  --  20*  GLUCOSE 116*  --  108*  BUN 43*  --  37*  CREATININE 4.12* 3.90* 3.74*  CALCIUM 7.3*  --  6.8*  PHOS  --   --  3.0   Liver Function Tests: Recent Labs  Lab 08/17/22 0601 08/18/22 0052  AST 22  --   ALT 19  --   ALKPHOS 114  --   BILITOT 0.3  --   PROT 7.2  --   ALBUMIN 4.2 3.5   No results for input(s): "LIPASE", "AMYLASE" in the last 168 hours. No results for input(s): "AMMONIA" in the last 168 hours. CBC: Recent Labs  Lab 08/17/22 0556 08/17/22 1702  WBC 10.0 8.2  HGB 11.7* 10.4*  HCT 34.6* 31.3*  MCV 91.3 90.5  PLT 206 204   Cardiac Enzymes: No  results for input(s): "CKTOTAL", "CKMB", "CKMBINDEX", "TROPONINI" in the last 168 hours. CBG: No results for input(s): "GLUCAP" in the last 168 hours.  Iron Studies: No results for input(s): "IRON", "TIBC", "TRANSFERRIN", "FERRITIN" in the last 72 hours. Studies/Results: ECHOCARDIOGRAM COMPLETE  Result Date: 08/18/2022    ECHOCARDIOGRAM REPORT   Patient Name:   Mark Hayden Date of Exam: 08/18/2022 Medical Rec #:  [id number]             Height:       71.0 in Accession #:    [id number]            Weight:       201.2 lb Date of Birth:  1975/03/30            BSA:          2.114 m Patient Age:    46 years              BP:           185/110 mmHg Patient Gender: M                     HR:           90 bpm. Exam Location:  Inpatient Procedure: 2D Echo, Cardiac Doppler, Color Doppler and Strain Analysis Indications:    CHF- Acute Diastolic  History:        Patient has no prior history of Echocardiogram examinations.                 CHF, Acute Kidney Disease; Risk Factors:Hypertension and Former                 Smoker.  Sonographer:     Raeford Razor Referring Phys: Sharla Kidney Werner Lean GHIMIRE IMPRESSIONS  1. Left ventricular ejection fraction, by estimation, is 45 to 50%. The left ventricle has mildly decreased function. The left ventricle demonstrates global hypokinesis. There is severe left ventricular hypertrophy. Left ventricular diastolic parameters  are indeterminate. The average left ventricular global longitudinal strain is -15.8 %. The global longitudinal strain is abnormal.  2. Right ventricular systolic function is normal. The right ventricular size is normal.  3. Left atrial size was mildly dilated.  4. The mitral valve is normal in structure. Moderate to severe mitral valve regurgitation. No evidence of mitral stenosis.  5. The aortic valve is tricuspid. Aortic valve regurgitation is not visualized. No aortic stenosis is present.  6. Pulmonic valve regurgitation is severe.  7. The inferior vena cava is normal in size with greater than 50% respiratory variability, suggesting right atrial pressure of 3 mmHg. FINDINGS  Left Ventricle: Left ventricular ejection fraction, by estimation, is 45 to 50%. The left ventricle has mildly decreased function. The left ventricle demonstrates global hypokinesis. The average left ventricular global longitudinal strain is -15.8 %. The global longitudinal strain is abnormal. The left ventricular internal cavity size was normal in size. There is severe left ventricular hypertrophy. Left ventricular diastolic parameters are indeterminate. Right Ventricle: The right ventricular size is normal. No increase in right ventricular wall thickness. Right ventricular systolic function is normal. Left Atrium: Left atrial size was mildly dilated. Right Atrium: Right atrial size was normal in size. Pericardium: There is no evidence of pericardial effusion. Mitral Valve: The mitral valve is normal in structure. Moderate to severe mitral valve regurgitation. No evidence of mitral valve stenosis. Tricuspid Valve: The tricuspid  valve is normal in structure. Tricuspid valve regurgitation is mild . No evidence  of tricuspid stenosis. Aortic Valve: The aortic valve is tricuspid. Aortic valve regurgitation is not visualized. No aortic stenosis is present. Aortic valve mean gradient measures 5.0 mmHg. Aortic valve peak gradient measures 8.4 mmHg. Aortic valve area, by VTI measures 2.98 cm. Pulmonic Valve: The pulmonic valve was normal in structure. Pulmonic valve regurgitation is severe. No evidence of pulmonic stenosis. Aorta: The aortic root is normal in size and structure. Venous: The inferior vena cava is normal in size with greater than 50% respiratory variability, suggesting right atrial pressure of 3 mmHg. IAS/Shunts: No atrial level shunt detected by color flow Doppler.  LEFT VENTRICLE PLAX 2D LVIDd:         4.40 cm      Diastology LVIDs:         3.40 cm      LV e' medial:    7.94 cm/s LV PW:         1.90 cm      LV E/e' medial:  16.4 LV IVS:        2.00 cm      LV e' lateral:   13.60 cm/s LVOT diam:     2.30 cm      LV E/e' lateral: 9.6 LV SV:         71 LV SV Index:   34           2D Longitudinal Strain LVOT Area:     4.15 cm     2D Strain GLS Avg:     -15.8 %  LV Volumes (MOD) LV vol d, MOD A2C: 140.0 ml LV vol d, MOD A4C: 110.0 ml LV vol s, MOD A2C: 70.4 ml LV vol s, MOD A4C: 63.5 ml LV SV MOD A2C:     69.6 ml LV SV MOD A4C:     110.0 ml LV SV MOD BP:      55.3 ml RIGHT VENTRICLE             IVC RV Basal diam:  2.80 cm     IVC diam: 1.60 cm RV S prime:     13.10 cm/s TAPSE (M-mode): 2.2 cm LEFT ATRIUM             Index        RIGHT ATRIUM           Index LA diam:        4.40 cm 2.08 cm/m   RA Area:     15.00 cm LA Vol (A2C):   90.1 ml 42.62 ml/m  RA Volume:   37.70 ml  17.83 ml/m LA Vol (A4C):   70.0 ml 33.11 ml/m LA Biplane Vol: 80.0 ml 37.84 ml/m  AORTIC VALVE                    PULMONIC VALVE AV Area (Vmax):    2.89 cm     PV Vmax:       0.92 m/s AV Area (Vmean):   2.81 cm     PV Peak grad:  3.4 mmHg AV Area (VTI):      2.98 cm AV Vmax:           145.00 cm/s AV Vmean:          98.600 cm/s AV VTI:            0.240 m AV Peak Grad:      8.4 mmHg AV Mean Grad:      5.0 mmHg LVOT Vmax:  101.00 cm/s LVOT Vmean:        66.600 cm/s LVOT VTI:          0.172 m LVOT/AV VTI ratio: 0.72  AORTA Ao Root diam: 2.80 cm Ao Asc diam:  3.20 cm MITRAL VALVE MV Area (PHT): 5.62 cm       SHUNTS MV Decel Time: 135 msec       Systemic VTI:  0.17 m MR Peak grad:    112.1 mmHg   Systemic Diam: 2.30 cm MR Mean grad:    83.0 mmHg MR Vmax:         529.50 cm/s MR Vmean:        439.0 cm/s MR PISA:         0.57 cm MR PISA Eff ROA: 4 mm MR PISA Radius:  0.30 cm MV E velocity: 130.00 cm/s MV A velocity: 72.30 cm/s MV E/A ratio:  1.80 Donato Schultz MD Electronically signed by Donato Schultz MD Signature Date/Time: 08/18/2022/10:51:54 AM    Final    US RENAL  Result Date: 08/17/2022 CLINICAL DATA:  Acute kidney injury EXAM: RENAL / URINARY TRACT ULTRASOUND COMPLETE COMPARISON:  None Available. FINDINGS: Right Kidney: Renal measurements: 8.7 x 5.2 x 5.2 cm = volume: 123.5 mL. Echogenicity within normal limits. No mass or hydronephrosis visualized. Left Kidney: Renal measurements: 9.9 x 4.7 x 4.4 cm = volume: 106.1 mL. Echogenicity within normal limits. No mass or hydronephrosis visualized. Bladder: Appears normal for degree of bladder distention. Bilateral jets visualized. Other: None. IMPRESSION: No hydronephrosis. Electronically Signed   By: Allegra Lai M.D.   On: 08/17/2022 20:20   DG Chest Port 1 View  Result Date: 08/17/2022 CLINICAL DATA:  [id number] with shortness of breath for 2 days, worse when lying flat. Ran out of BP meds 1.5 weeks ago. EXAM: PORTABLE CHEST 1 VIEW COMPARISON:  Portable chest 09/07/2019 FINDINGS: There is mild cardiomegaly with increased vascular engorgement, flow cephalization, and increased central and basilar interstitial edema with small pleural effusions. Perihilar hazy opacities are also noted on the right greater than  left and may be due to ground-glass edema. Underlying pneumonia would be difficult to exclude given the asymmetry. The mediastinum is normally outlined. Thoracic cage is intact. Numerous overlying monitor wires are present. IMPRESSION: 1. Cardiomegaly with increased vascular engorgement, interstitial edema and small pleural effusions consistent with CHF or fluid overload. 2. Perihilar hazy opacities on the right greater than left may be due to ground-glass edema or pneumonia. Electronically Signed   By: Almira Bar M.D.   On: 08/17/2022 06:42   Medications: Infusions:  sodium chloride     magnesium sulfate bolus IVPB 2 g (08/18/22 1109)   nitroGLYCERIN Stopped (08/17/22 1748)    Scheduled Medications:  heparin  5,000 Units Subcutaneous Q8H   hydrALAZINE  50 mg Oral Q8H   isosorbide mononitrate  30 mg Oral Daily   metoprolol tartrate  50 mg Oral BID   sodium chloride flush  3 mL Intravenous Q12H    have reviewed scheduled and prn medications.  Physical Exam: General:NAD Heart: tachy Lungs: mostly clear Abdomen: soft, non tender Extremities: no edema     08/18/2022,11:35 AM  LOS: 1 day

## 2022-08-18 NOTE — Progress Notes (Signed)
Echocardiogram 2D Echocardiogram has been performed.  Mark Hayden 08/18/2022, 9:16 AM

## 2022-08-18 NOTE — Progress Notes (Signed)
[facility] Progress Note  Patient: Mark Hayden    QMV:[id number]  DOA: 08/17/2022     Date of Service: the patient was seen and examined on 08/18/2022  Chief Complaint  Patient presents with   Shortness of Breath   Brief hospital course: Mark Hayden is a 47 y.o. male with medical history significant of history of poorly controlled hypertension due to noncompliance (financial issues), stage IV CKD, marijuana abuse-who [facility] with shortness of breath.   Per patient-he ran out of his blood pressure medications approximately 2 weeks back. Even when he was on his blood pressure medications-he was only taking them every few days-as he was trying to stretch them to last for longer. Of time. Approximately 2 days or so-he noticed some mild leg edema, and worsening shortness of breath.  He was evaluated at [facility]-where he was found to have blood pressure into the 230s systolic, chest x-ray was consistent with pulm edema, creatinine was elevated at 4.12. He was started on a nitroglycerin drip, given diuretics-and subsequently transferred to [facility] on the [facility] service for further ongoing treatment/evaluation.     Assessment and Plan:  # Hypertensive emergency Secondary to noncompliance to antihypertensives Continue nitroglycerin IV infusion and gradually taper off Started metoprolol 50 mg p.o. twice daily Continue spironolactone 25 mg every evening, Imdur 30 mg p.o. daily, hydralazine 50 mg p.o. 3 times daily Monitor BP and titrate medications accordingly  # Acute on chronic systolic CHF exacerbation Mild lower extremity edema S/p Lasix 80 mg x one dose Continue spironolactone, beta-blocker and hydralazine as above Monitor intake and output TTE shows LVEF 45 to 50%, global LV hypokinesis, moderate to severe mitral valve regurgitation, severe pulmonic valve regurgitation Recommend to follow-up with cardiology as an outpatient and  repeat 2D echocardiogram after 4 months    # Minimally elevated troponin levels Demand ischemia in the setting of CHF/uncontrolled hypertension TTE as above  # AKI versus progression of known CKD 4 Reviewed care everywhere-prior creatinine around 3.0 (August 2023) Unclear whether this is AKI or progression of CKD 4 due to uncontrolled hypertension Renal ultrasound Nephrology consulted Monitor urine output and creatinine daily  # Hypokalemia, potassium repleted cautiously due to renal failure Monitor potassium level and replace as needed # Hypomagnesemia, mag repleted. Monitor electrolytes and replete as needed.  # Iron deficiency anemia, transferrin saturation 6%, started Venofer 200 mg IV daily x 5 days, start oral iron supplement on discharge.  # Vitamin D deficiency: started vitamin D 50,000 units p.o. weekly, follow with PCP to repeat vitamin D level after 3 to 6 months.  # Vitamin B12 deficiency: B12 level 82, goal >400, started vitamin B12 1000 mcg IM injection daily during hospital stay, followed by oral supplement.  Follow-up PCP to repeat vitamin B12 level after 3 to 6 months.     Bloody ejaculate with masturbation on 5/30 Urine is clear this morning-no obvious hematuria Unclear significance Check UA to see if he has hematuria. Urology eval in the outpatient setting   Medication noncompliance This is due to financial issues TOC consult   Marijuana use Counseled  Body mass index is 28.06 kg/m.  Interventions:     Diet: Renal diet, fluid restriction 1200 mL/day DVT Prophylaxis: Subcutaneous Heparin    Advance goals of care discussion: Full code  Family Communication: family was not present at bedside, at the time of interview.  The pt provided permission to discuss medical plan with the family. Opportunity was given to  ask question and all questions were answered satisfactorily.   Disposition:  Pt is from Home, admitted with volume overload, hypertensive  emergency, still on nitro infusion.  Noticed iron deficiency, started IV Venofer, which precludes a safe discharge. Discharge to Home, when clinically stable, may need few days to improve..  Subjective: No significant events overnight, patient feels improvement in the shortness of breath, no chest pain or palpitations at this time.  Physical Exam: General: NAD, lying comfortably Appear in no distress, affect appropriate Eyes: PERRLA ENT: Oral Mucosa Clear, moist  Neck: no JVD,  Cardiovascular: S1 and S2 Present, no Murmur,  Respiratory: good respiratory effort, Bilateral Air entry equal and Decreased, no Crackles, no wheezes Abdomen: Bowel Sound present, Soft and no tenderness,  Skin: no rashes Extremities: no Pedal edema, no calf tenderness Neurologic: without any new focal findings Gait not checked due to patient safety concerns  Vitals:   08/18/22 0745 08/18/22 0943 08/18/22 1219 08/18/22 1419  BP: (!) 185/110 (!) 163/108 (!) 157/103 (!) 174/109  Pulse: 98 93 95   Resp: 17 18 18    Temp: 99.2 F (37.3 C)  98.3 F (36.8 C)   TempSrc: Oral  Oral   SpO2: 100%  100%   Weight:      Height:        Intake/Output Summary (Last 24 hours) at 08/18/2022 1459 Last data filed at 08/18/2022 1410 Gross per 24 hour  Intake 1600.46 ml  Output 3575 ml  Net -1974.54 ml   Filed Weights   08/17/22 0613 08/17/22 1536 08/18/22 0526  Weight: 105 kg 93.8 kg 91.3 kg    Data Reviewed: I have personally reviewed and interpreted daily labs, tele strips, imagings as discussed above. I reviewed all nursing notes, pharmacy notes, vitals, pertinent old records I have discussed plan of care as described above with RN and patient/family.  CBC: Recent Labs  Lab 08/17/22 0556 08/17/22 1702  WBC 10.0 8.2  HGB 11.7* 10.4*  HCT 34.6* 31.3*  MCV 91.3 90.5  PLT 206 204   Basic Metabolic Panel: Recent Labs  Lab 08/17/22 0556 08/17/22 1702 08/18/22 0052 08/18/22 1410  NA 141  --  138 136  K  3.7  --  2.8* 3.3*  CL 108  --  106 105  CO2 22  --  20* 22  GLUCOSE 116*  --  108* 137*  BUN 43*  --  37* 42*  CREATININE 4.12* 3.90* 3.74* 4.01*  CALCIUM 7.3*  --  6.8* 6.9*  MG  --   --  1.3*  --   PHOS  --   --  3.0  --     Studies: ECHOCARDIOGRAM COMPLETE  Result Date: 08/18/2022    ECHOCARDIOGRAM REPORT   Patient Name:   [facility] Date of Exam: 08/18/2022 Medical Rec #:  [id number]             Height:       71.0 in Accession #:    [id number]            Weight:       201.2 lb Date of Birth:  Jul 07, 1975            BSA:          2.114 m Patient Age:    46 years              BP:           185/110 mmHg Patient Gender: M  HR:           90 bpm. Exam Location:  Inpatient Procedure: 2D Echo, Cardiac Doppler, Color Doppler and Strain Analysis Indications:    CHF- Acute Diastolic  History:        Patient has no prior history of Echocardiogram examinations.                 CHF, Acute Kidney Disease; Risk Factors:Hypertension and Former                 Smoker.  Sonographer:    Raeford Razor Referring Phys: Sharla Kidney Werner Lean GHIMIRE IMPRESSIONS  1. Left ventricular ejection fraction, by estimation, is 45 to 50%. The left ventricle has mildly decreased function. The left ventricle demonstrates global hypokinesis. There is severe left ventricular hypertrophy. Left ventricular diastolic parameters  are indeterminate. The average left ventricular global longitudinal strain is -15.8 %. The global longitudinal strain is abnormal.  2. Right ventricular systolic function is normal. The right ventricular size is normal.  3. Left atrial size was mildly dilated.  4. The mitral valve is normal in structure. Moderate to severe mitral valve regurgitation. No evidence of mitral stenosis.  5. The aortic valve is tricuspid. Aortic valve regurgitation is not visualized. No aortic stenosis is present.  6. Pulmonic valve regurgitation is severe.  7. The inferior vena cava is normal in size with greater than  50% respiratory variability, suggesting right atrial pressure of 3 mmHg. FINDINGS  Left Ventricle: Left ventricular ejection fraction, by estimation, is 45 to 50%. The left ventricle has mildly decreased function. The left ventricle demonstrates global hypokinesis. The average left ventricular global longitudinal strain is -15.8 %. The global longitudinal strain is abnormal. The left ventricular internal cavity size was normal in size. There is severe left ventricular hypertrophy. Left ventricular diastolic parameters are indeterminate. Right Ventricle: The right ventricular size is normal. No increase in right ventricular wall thickness. Right ventricular systolic function is normal. Left Atrium: Left atrial size was mildly dilated. Right Atrium: Right atrial size was normal in size. Pericardium: There is no evidence of pericardial effusion. Mitral Valve: The mitral valve is normal in structure. Moderate to severe mitral valve regurgitation. No evidence of mitral valve stenosis. Tricuspid Valve: The tricuspid valve is normal in structure. Tricuspid valve regurgitation is mild . No evidence of tricuspid stenosis. Aortic Valve: The aortic valve is tricuspid. Aortic valve regurgitation is not visualized. No aortic stenosis is present. Aortic valve mean gradient measures 5.0 mmHg. Aortic valve peak gradient measures 8.4 mmHg. Aortic valve area, by VTI measures 2.98 cm. Pulmonic Valve: The pulmonic valve was normal in structure. Pulmonic valve regurgitation is severe. No evidence of pulmonic stenosis. Aorta: The aortic root is normal in size and structure. Venous: The inferior vena cava is normal in size with greater than 50% respiratory variability, suggesting right atrial pressure of 3 mmHg. IAS/Shunts: No atrial level shunt detected by color flow Doppler.  LEFT VENTRICLE PLAX 2D LVIDd:         4.40 cm      Diastology LVIDs:         3.40 cm      LV e' medial:    7.94 cm/s LV PW:         1.90 cm      LV E/e' medial:   16.4 LV IVS:        2.00 cm      LV e' lateral:   13.60 cm/s LVOT diam:  2.30 cm      LV E/e' lateral: 9.6 LV SV:         71 LV SV Index:   34           2D Longitudinal Strain LVOT Area:     4.15 cm     2D Strain GLS Avg:     -15.8 %  LV Volumes (MOD) LV vol d, MOD A2C: 140.0 ml LV vol d, MOD A4C: 110.0 ml LV vol s, MOD A2C: 70.4 ml LV vol s, MOD A4C: 63.5 ml LV SV MOD A2C:     69.6 ml LV SV MOD A4C:     110.0 ml LV SV MOD BP:      55.3 ml RIGHT VENTRICLE             IVC RV Basal diam:  2.80 cm     IVC diam: 1.60 cm RV S prime:     13.10 cm/s TAPSE (M-mode): 2.2 cm LEFT ATRIUM             Index        RIGHT ATRIUM           Index LA diam:        4.40 cm 2.08 cm/m   RA Area:     15.00 cm LA Vol (A2C):   90.1 ml 42.62 ml/m  RA Volume:   37.70 ml  17.83 ml/m LA Vol (A4C):   70.0 ml 33.11 ml/m LA Biplane Vol: 80.0 ml 37.84 ml/m  AORTIC VALVE                    PULMONIC VALVE AV Area (Vmax):    2.89 cm     PV Vmax:       0.92 m/s AV Area (Vmean):   2.81 cm     PV Peak grad:  3.4 mmHg AV Area (VTI):     2.98 cm AV Vmax:           145.00 cm/s AV Vmean:          98.600 cm/s AV VTI:            0.240 m AV Peak Grad:      8.4 mmHg AV Mean Grad:      5.0 mmHg LVOT Vmax:         101.00 cm/s LVOT Vmean:        66.600 cm/s LVOT VTI:          0.172 m LVOT/AV VTI ratio: 0.72  AORTA Ao Root diam: 2.80 cm Ao Asc diam:  3.20 cm MITRAL VALVE MV Area (PHT): 5.62 cm       SHUNTS MV Decel Time: 135 msec       Systemic VTI:  0.17 m MR Peak grad:    112.1 mmHg   Systemic Diam: 2.30 cm MR Mean grad:    83.0 mmHg MR Vmax:         529.50 cm/s MR Vmean:        439.0 cm/s MR PISA:         0.57 cm MR PISA Eff ROA: 4 mm MR PISA Radius:  0.30 cm MV E velocity: 130.00 cm/s MV A velocity: 72.30 cm/s MV E/A ratio:  1.80 Donato Schultz MD Electronically signed by Donato Schultz MD Signature Date/Time: 08/18/2022/10:51:54 AM    Final    US RENAL  Result Date: 08/17/2022 CLINICAL DATA:  Acute kidney injury EXAM: RENAL / URINARY TRACT  ULTRASOUND COMPLETE COMPARISON:  None Available. FINDINGS: Right Kidney: Renal measurements: 8.7 x 5.2 x 5.2 cm = volume: 123.5 mL. Echogenicity within normal limits. No mass or hydronephrosis visualized. Left Kidney: Renal measurements: 9.9 x 4.7 x 4.4 cm = volume: 106.1 mL. Echogenicity within normal limits. No mass or hydronephrosis visualized. Bladder: Appears normal for degree of bladder distention. Bilateral jets visualized. Other: None. IMPRESSION: No hydronephrosis. Electronically Signed   By: Allegra Lai M.D.   On: 08/17/2022 20:20    Scheduled Meds:  heparin  5,000 Units Subcutaneous Q8H   hydrALAZINE  50 mg Oral Q8H   isosorbide mononitrate  30 mg Oral Daily   metoprolol tartrate  50 mg Oral BID   sodium chloride flush  3 mL Intravenous Q12H   spironolactone  25 mg Oral QPM   Continuous Infusions:  sodium chloride     nitroGLYCERIN Stopped (08/17/22 1748)   PRN Meds: sodium chloride, acetaminophen **OR** acetaminophen, albuterol, labetalol, ondansetron **OR** ondansetron (ZOFRAN) IV, polyethylene glycol, sodium chloride flush  Time spent: 55 minutes  Author: Gillis Santa. MD [facility] 08/18/2022 2:59 PM  To reach On-call, see care teams to locate the attending and reach out to them via [email or web address]. If 7PM-7AM, please contact night-coverage If you still have difficulty reaching the attending provider, please page the [facility] (Director on Call) for [facility] on amion for assistance.

## 2022-08-18 NOTE — Progress Notes (Signed)
Approximately 0816--Pt transported down to Echo with transporter. Pt currently A&O x 4 with no S/S of acute distress. This RN called Echo tech to let them know of pt's current BP-- currently 185/118 MAP 129, asymptomatic. Per echo, okay to give PRN IV Labetalol before pt goes down. RN to assess pt upon return to floor.

## 2022-08-19 DIAGNOSIS — I5021 Acute systolic (congestive) heart failure: Secondary | ICD-10-CM

## 2022-08-19 LAB — CBC
HCT: 33.2 % — ABNORMAL LOW (ref 39.0–52.0)
Hemoglobin: 11.1 g/dL — ABNORMAL LOW (ref 13.0–17.0)
MCH: 30.4 pg (ref 26.0–34.0)
MCHC: 33.4 g/dL (ref 30.0–36.0)
MCV: 91 fL (ref 80.0–100.0)
Platelets: 187 10*3/uL (ref 150–400)
RBC: 3.65 MIL/uL — ABNORMAL LOW (ref 4.22–5.81)
RDW: 13.1 % (ref 11.5–15.5)
WBC: 8.5 10*3/uL (ref 4.0–10.5)
nRBC: 0 % (ref 0.0–0.2)

## 2022-08-19 LAB — BASIC METABOLIC PANEL
Anion gap: 9 (ref 5–15)
BUN: 46 mg/dL — ABNORMAL HIGH (ref 6–20)
CO2: 20 mmol/L — ABNORMAL LOW (ref 22–32)
Calcium: 7.3 mg/dL — ABNORMAL LOW (ref 8.9–10.3)
Chloride: 107 mmol/L (ref 98–111)
Creatinine, Ser: 3.82 mg/dL — ABNORMAL HIGH (ref 0.61–1.24)
GFR, Estimated: 19 mL/min — ABNORMAL LOW (ref >60)
Glucose, Bld: 99 mg/dL (ref 70–99)
Potassium: 3.6 mmol/L (ref 3.5–5.1)
Sodium: 136 mmol/L (ref 135–145)

## 2022-08-19 LAB — MAGNESIUM: Magnesium: 1.9 mg/dL (ref 1.7–2.4)

## 2022-08-19 LAB — PHOSPHORUS: Phosphorus: 3.6 mg/dL (ref 2.5–4.6)

## 2022-08-19 MED ORDER — ISOSORBIDE MONONITRATE ER 60 MG PO TB24
60.0000 mg | ORAL_TABLET | Freq: Every day | ORAL | Status: DC
  Administered 2022-08-20: 60 mg via ORAL
  Filled 2022-08-19: qty 1

## 2022-08-19 MED ORDER — ORAL CARE MOUTH RINSE: 15.0000 mL | OROMUCOSAL | Status: DC | PRN

## 2022-08-19 MED ORDER — METOPROLOL SUCCINATE ER 50 MG PO TB24
50.0000 mg | ORAL_TABLET | Freq: Every day | ORAL | Status: DC
  Administered 2022-08-20: 50 mg via ORAL
  Filled 2022-08-19: qty 1

## 2022-08-19 MED ORDER — HYDRALAZINE HCL 50 MG PO TABS
75.0000 mg | ORAL_TABLET | Freq: Three times a day (TID) | ORAL | Status: DC
  Administered 2022-08-19 – 2022-08-20 (×4): 75 mg via ORAL
  Filled 2022-08-19 (×4): qty 1

## 2022-08-19 NOTE — Progress Notes (Signed)
Subjective:  great UOP-  kidney function poor but stable.  BP still highish but better than admit on hydralazine 50 q 8, imdur 30, lopressor 50 bid and aldactone 25 at night   Objective Vital signs in last 24 hours: Vitals:   08/18/22 1615 08/18/22 1938 08/19/22 0037 08/19/22 0453  BP: (!) 159/99 (!) 175/93 (!) 160/114 (!) 191/107  Pulse: 89 94  81  Resp: 18 16 16 18   Temp:  99.3 F (37.4 C) 98.2 F (36.8 C) 99.4 F (37.4 C)  TempSrc:  Oral Oral Oral  SpO2: 100% 100%  100%  Weight:    91.3 kg  Height:       Weight change: -2.536 kg  Intake/Output Summary (Last 24 hours) at 08/19/2022 1610 Last data filed at 08/19/2022 0500 Gross per 24 hour  Intake 1308.36 ml  Output 1675 ml  Net -366.64 ml     Assessment/Plan: 47 year old BM-  poorly controlled BP for years-  now with AKi in the setting of hypertensive urgency 1.Renal- history of AKI/CKD in the setting of hypertensive urgency in August of 23.  Has not had BP well controlled since unfortunately and now presents with another episode of hypertensive urgency.  Prmary team did an excellent job of managing pts BP-  was better.  Urine is bland-  [place] pretty negative.  It is possible that long term kidney function could stabilize with better BP control.  No indications for dialysis 2. Hypertension/volume  - excellent management initially-  now off ntg drip-    hydralazine 50 tid and imdur and lopressor-  added aldactone..  stopped lasix since volume status seems better  echo does show lvh and dec ef.  Main objective is to get him on BP reg that is simple enough for him to keep up with and afford.  This is his best chance to protect kidneys in the future.  With this in mind I would favor  extended release toprol. The change that I will make today is to increase hydralazine to 75-  then will change to toprol xl tomorrow AM-  continue aldactone.  He will need to be followed by [facility] on discharge to fine tune his BP meds and watch kidney function, it is  always difficult to manage BP as inpatient  If BP stays around 150-170/80-95  and no other plans for him I would be OK with him being discharged on toprol 50 and imdur 30 in AM-  hydral 75 BID and aldactone 25 at night and I will fine tune as OP - will see him within the month if he shows-  I have also strongly recommended a BP cuff for home use   3. Anemia  - not a major issue -  getting iron but does not need to stay to get    Cecille Aver    Labs: Basic Metabolic Panel: Recent Labs  Lab 08/18/22 0052 08/18/22 1410 08/19/22 0107  NA 138 136 136  K 2.8* 3.3* 3.6  CL 106 105 107  CO2 20* 22 20*  GLUCOSE 108* 137* 99  BUN 37* 42* 46*  CREATININE 3.74* 4.01* 3.82*  CALCIUM 6.8* 6.9* 7.3*  PHOS 3.0  --  3.6   Liver Function Tests: Recent Labs  Lab 08/17/22 0601 08/18/22 0052  AST 22  --   ALT 19  --   ALKPHOS 114  --   BILITOT 0.3  --   PROT 7.2  --   ALBUMIN 4.2 3.5  No results for input(s): "LIPASE", "AMYLASE" in the last 168 hours. No results for input(s): "AMMONIA" in the last 168 hours. CBC: Recent Labs  Lab 08/17/22 0556 08/17/22 1702 08/19/22 0107  WBC 10.0 8.2 8.5  HGB 11.7* 10.4* 11.1*  HCT 34.6* 31.3* 33.2*  MCV 91.3 90.5 91.0  PLT 206 204 187   Cardiac Enzymes: No results for input(s): "CKTOTAL", "CKMB", "CKMBINDEX", "TROPONINI" in the last 168 hours. CBG: No results for input(s): "GLUCAP" in the last 168 hours.  Iron Studies:  Recent Labs    08/18/22 1410  IRON 17*  TIBC 277   Studies/Results: ECHOCARDIOGRAM COMPLETE  Result Date: 08/18/2022    ECHOCARDIOGRAM REPORT   Patient Name:   [facility] Date of Exam: 08/18/2022 Medical Rec #:  [id number]             Height:       71.0 in Accession #:    [id number]            Weight:       201.2 lb Date of Birth:  08-27-1975            BSA:          2.114 m Patient Age:    47 years              BP:           185/110 mmHg Patient Gender: M                     HR:           90 bpm.  Exam Location:  Inpatient Procedure: 2D Echo, Cardiac Doppler, Color Doppler and Strain Analysis Indications:    CHF- Acute Diastolic  History:        Patient has no prior history of Echocardiogram examinations.                 CHF, Acute Kidney Disease; Risk Factors:Hypertension and Former                 Smoker.  Sonographer:    Raeford Razor Referring Phys: Sharla Kidney Werner Lean GHIMIRE IMPRESSIONS  1. Left ventricular ejection fraction, by estimation, is 45 to 50%. The left ventricle has mildly decreased function. The left ventricle demonstrates global hypokinesis. There is severe left ventricular hypertrophy. Left ventricular diastolic parameters  are indeterminate. The average left ventricular global longitudinal strain is -15.8 %. The global longitudinal strain is abnormal.  2. Right ventricular systolic function is normal. The right ventricular size is normal.  3. Left atrial size was mildly dilated.  4. The mitral valve is normal in structure. Moderate to severe mitral valve regurgitation. No evidence of mitral stenosis.  5. The aortic valve is tricuspid. Aortic valve regurgitation is not visualized. No aortic stenosis is present.  6. Pulmonic valve regurgitation is severe.  7. The inferior vena cava is normal in size with greater than 50% respiratory variability, suggesting right atrial pressure of 3 mmHg. FINDINGS  Left Ventricle: Left ventricular ejection fraction, by estimation, is 45 to 50%. The left ventricle has mildly decreased function. The left ventricle demonstrates global hypokinesis. The average left ventricular global longitudinal strain is -15.8 %. The global longitudinal strain is abnormal. The left ventricular internal cavity size was normal in size. There is severe left ventricular hypertrophy. Left ventricular diastolic parameters are indeterminate. Right Ventricle: The right ventricular size is normal. No increase in right ventricular wall thickness. Right ventricular systolic function is  normal.  Left Atrium: Left atrial size was mildly dilated. Right Atrium: Right atrial size was normal in size. Pericardium: There is no evidence of pericardial effusion. Mitral Valve: The mitral valve is normal in structure. Moderate to severe mitral valve regurgitation. No evidence of mitral valve stenosis. Tricuspid Valve: The tricuspid valve is normal in structure. Tricuspid valve regurgitation is mild . No evidence of tricuspid stenosis. Aortic Valve: The aortic valve is tricuspid. Aortic valve regurgitation is not visualized. No aortic stenosis is present. Aortic valve mean gradient measures 5.0 mmHg. Aortic valve peak gradient measures 8.4 mmHg. Aortic valve area, by VTI measures 2.98 cm. Pulmonic Valve: The pulmonic valve was normal in structure. Pulmonic valve regurgitation is severe. No evidence of pulmonic stenosis. Aorta: The aortic root is normal in size and structure. Venous: The inferior vena cava is normal in size with greater than 50% respiratory variability, suggesting right atrial pressure of 3 mmHg. IAS/Shunts: No atrial level shunt detected by color flow Doppler.  LEFT VENTRICLE PLAX 2D LVIDd:         4.40 cm      Diastology LVIDs:         3.40 cm      LV e' medial:    7.94 cm/s LV PW:         1.90 cm      LV E/e' medial:  16.4 LV IVS:        2.00 cm      LV e' lateral:   13.60 cm/s LVOT diam:     2.30 cm      LV E/e' lateral: 9.6 LV SV:         71 LV SV Index:   34           2D Longitudinal Strain LVOT Area:     4.15 cm     2D Strain GLS Avg:     -15.8 %  LV Volumes (MOD) LV vol d, MOD A2C: 140.0 ml LV vol d, MOD A4C: 110.0 ml LV vol s, MOD A2C: 70.4 ml LV vol s, MOD A4C: 63.5 ml LV SV MOD A2C:     69.6 ml LV SV MOD A4C:     110.0 ml LV SV MOD BP:      55.3 ml RIGHT VENTRICLE             IVC RV Basal diam:  2.80 cm     IVC diam: 1.60 cm RV S prime:     13.10 cm/s TAPSE (M-mode): 2.2 cm LEFT ATRIUM             Index        RIGHT ATRIUM           Index LA diam:        4.40 cm 2.08 cm/m   RA Area:      15.00 cm LA Vol (A2C):   90.1 ml 42.62 ml/m  RA Volume:   37.70 ml  17.83 ml/m LA Vol (A4C):   70.0 ml 33.11 ml/m LA Biplane Vol: 80.0 ml 37.84 ml/m  AORTIC VALVE                    PULMONIC VALVE AV Area (Vmax):    2.89 cm     PV Vmax:       0.92 m/s AV Area (Vmean):   2.81 cm     PV Peak grad:  3.4 mmHg AV Area (VTI):     2.98 cm  AV Vmax:           145.00 cm/s AV Vmean:          98.600 cm/s AV VTI:            0.240 m AV Peak Grad:      8.4 mmHg AV Mean Grad:      5.0 mmHg LVOT Vmax:         101.00 cm/s LVOT Vmean:        66.600 cm/s LVOT VTI:          0.172 m LVOT/AV VTI ratio: 0.72  AORTA Ao Root diam: 2.80 cm Ao Asc diam:  3.20 cm MITRAL VALVE MV Area (PHT): 5.62 cm       SHUNTS MV Decel Time: 135 msec       Systemic VTI:  0.17 m MR Peak grad:    112.1 mmHg   Systemic Diam: 2.30 cm MR Mean grad:    83.0 mmHg MR Vmax:         529.50 cm/s MR Vmean:        439.0 cm/s MR PISA:         0.57 cm MR PISA Eff ROA: 4 mm MR PISA Radius:  0.30 cm MV E velocity: 130.00 cm/s MV A velocity: 72.30 cm/s MV E/A ratio:  1.80 Donato Schultz MD Electronically signed by Donato Schultz MD Signature Date/Time: 08/18/2022/10:51:54 AM    Final    US RENAL  Result Date: 08/17/2022 CLINICAL DATA:  Acute kidney injury EXAM: RENAL / URINARY TRACT ULTRASOUND COMPLETE COMPARISON:  None Available. FINDINGS: Right Kidney: Renal measurements: 8.7 x 5.2 x 5.2 cm = volume: 123.5 mL. Echogenicity within normal limits. No mass or hydronephrosis visualized. Left Kidney: Renal measurements: 9.9 x 4.7 x 4.4 cm = volume: 106.1 mL. Echogenicity within normal limits. No mass or hydronephrosis visualized. Bladder: Appears normal for degree of bladder distention. Bilateral jets visualized. Other: None. IMPRESSION: No hydronephrosis. Electronically Signed   By: Allegra Lai M.D.   On: 08/17/2022 20:20   Medications: Infusions:  sodium chloride     iron sucrose Stopped (08/18/22 1652)   nitroGLYCERIN Stopped (08/17/22 1748)    Scheduled  Medications:  calcium carbonate  1 tablet Oral BID WC   cyanocobalamin  1,000 mcg Intramuscular Q1200   Followed by   Melene Muller ON 08/25/2022] vitamin B-12  1,000 mcg Oral Daily   heparin  5,000 Units Subcutaneous Q8H   hydrALAZINE  50 mg Oral Q8H   isosorbide mononitrate  30 mg Oral Daily   metoprolol tartrate  50 mg Oral BID   potassium chloride  20 mEq Oral BID   sodium chloride flush  3 mL Intravenous Q12H   spironolactone  25 mg Oral QPM   Vitamin D (Ergocalciferol)  50,000 Units Oral Q7 days    have reviewed scheduled and prn medications.  Physical Exam: General:NAD Heart: tachy Lungs: mostly clear Abdomen: soft, non tender Extremities: no edema     08/19/2022,8:11 AM  LOS: 2 days

## 2022-08-19 NOTE — Plan of Care (Signed)
  Problem: Education: Goal: Knowledge of General Education information will improve Description: Including pain rating scale, medication(s)/side effects and non-pharmacologic comfort measures Outcome: Progressing   Problem: Health Behavior/Discharge Planning: Goal: Ability to manage health-related needs will improve Outcome: Progressing   Problem: Clinical Measurements: Goal: Ability to maintain clinical measurements within normal limits will improve Outcome: Progressing Goal: Will remain free from infection Outcome: Progressing Goal: Diagnostic test results will improve Outcome: Progressing   Problem: Activity: Goal: Risk for activity intolerance will decrease Outcome: Progressing   Problem: Nutrition: Goal: Adequate nutrition will be maintained Outcome: Progressing   Problem: Coping: Goal: Level of anxiety will decrease Outcome: Progressing   Problem: Elimination: Goal: Will not experience complications related to bowel motility Outcome: Progressing Goal: Will not experience complications related to urinary retention Outcome: Progressing   Problem: Pain Managment: Goal: General experience of comfort will improve Outcome: Progressing   Problem: Safety: Goal: Ability to remain free from injury will improve Outcome: Progressing   

## 2022-08-19 NOTE — Progress Notes (Signed)
[facility] Progress Note  Patient: Mark Hayden    ZOX:[id number]  DOA: 08/17/2022     Date of Service: the patient was seen and examined on 08/19/2022  Chief Complaint  Patient presents with   Shortness of Breath   Brief hospital course: Mark Hayden is a 47 y.o. male with medical history significant of history of poorly controlled hypertension due to noncompliance (financial issues), stage IV CKD, marijuana abuse-who [facility] with shortness of breath.   Per patient-he ran out of his blood pressure medications approximately 2 weeks back. Even when he was on his blood pressure medications-he was only taking them every few days-as he was trying to stretch them to last for longer. Of time. Approximately 2 days or so-he noticed some mild leg edema, and worsening shortness of breath.  He was evaluated at [facility]-where he was found to have blood pressure into the 230s systolic, chest x-ray was consistent with pulm edema, creatinine was elevated at 4.12. He was started on a nitroglycerin drip, given diuretics-and subsequently transferred to [facility] for further ongoing treatment/evaluation.     Assessment and Plan:  # Hypertensive emergency Secondary to noncompliance to antihypertensives Continue nitroglycerin IV infusion and gradually taper off Started metoprolol 50 mg p.o. twice daily Continue spironolactone 25 mg every evening, 5/2 increased Imdur 60 mg p.o. daily, hydralazine 75 mg p.o. 3 times daily Monitor BP and titrate medications accordingly  # Acute on chronic systolic CHF exacerbation Mild lower extremity edema S/p Lasix 80 mg x one dose Continue spironolactone, beta-blocker and hydralazine as above Monitor intake and output TTE shows LVEF 45 to 50%, global LV hypokinesis, moderate to severe mitral valve regurgitation, severe pulmonic valve regurgitation Recommend to follow-up with cardiology as an  outpatient and repeat 2D echocardiogram after 4 months    # Minimally elevated troponin levels Demand ischemia in the setting of CHF/uncontrolled hypertension TTE as above  # AKI versus progression of known CKD 4 Reviewed care everywhere-prior creatinine around 3.0 (August 2023) Unclear whether this is AKI or progression of CKD 4 due to uncontrolled hypertension Renal ultrasound Nephrology consulted Monitor urine output and creatinine daily  # Hypokalemia, potassium repleted cautiously due to renal failure Monitor potassium level and replace as needed # Hypomagnesemia, mag repleted. Monitor electrolytes and replete as needed.  # Iron deficiency anemia, transferrin saturation 6%, started Venofer 200 mg IV daily x 5 days, start oral iron supplement on discharge.  # Vitamin D deficiency: started vitamin D 50,000 units p.o. weekly, follow with PCP to repeat vitamin D level after 3 to 6 months.  # Vitamin B12 deficiency: B12 level 82, goal >400, started vitamin B12 1000 mcg IM injection daily during hospital stay, followed by oral supplement.  Follow-up PCP to repeat vitamin B12 level after 3 to 6 months.     Bloody ejaculate with masturbation on 5/30 Urine is clear this morning-no obvious hematuria Unclear significance Check UA to see if he has hematuria. Urology eval in the outpatient setting   Medication noncompliance This is due to financial issues TOC consult   Marijuana use Counseled  Body mass index is 28.06 kg/m.  Interventions:     Diet: Renal diet, fluid restriction 1200 mL/day DVT Prophylaxis: Subcutaneous Heparin    Advance goals of care discussion: Full code  Family Communication: family was not present at bedside, at the time of interview.  The pt provided permission to discuss medical plan with the family. Opportunity was  given to ask question and all questions were answered satisfactorily.   Disposition:  Pt is from Home, admitted with volume overload,  hypertensive emergency, still on nitro infusion.  Noticed iron deficiency, started IV Venofer, which precludes a safe discharge. Discharge to Home, when clinically stable, may need few days to improve. Patient does not have insurance, all the medications need to be sent to [facility] pharmacy and patient will need information [facility].   Subjective: No significant events overnight, patient feels improvement in the shortness of breath, no chest pain or palpitations at this time.  Physical Exam: General: NAD, lying comfortably Appear in no distress, affect appropriate Eyes: PERRLA ENT: Oral Mucosa Clear, moist  Neck: no JVD,  Cardiovascular: S1 and S2 Present, no Murmur,  Respiratory: good respiratory effort, Bilateral Air entry equal and Decreased, no Crackles, no wheezes Abdomen: Bowel Sound present, Soft and no tenderness,  Skin: no rashes Extremities: no Pedal edema, no calf tenderness Neurologic: without any new focal findings Gait not checked due to patient safety concerns  Vitals:   08/19/22 0037 08/19/22 0453 08/19/22 0720 08/19/22 1157  BP: (!) 160/114 (!) 191/107 (!) 157/87 (!) 158/112  Pulse:  81 85 74  Resp: 16 18 18 17   Temp: 98.2 F (36.8 C) 99.4 F (37.4 C) 97.8 F (36.6 C) 98.2 F (36.8 C)  TempSrc: Oral Oral Oral Oral  SpO2:  100% 100% 100%  Weight:  91.3 kg    Height:        Intake/Output Summary (Last 24 hours) at 08/19/2022 1411 Last data filed at 08/19/2022 1200 Gross per 24 hour  Intake 1528.36 ml  Output 1675 ml  Net -146.64 ml   Filed Weights   08/17/22 1536 08/18/22 0526 08/19/22 0453  Weight: 93.8 kg 91.3 kg 91.3 kg    Data Reviewed: I have personally reviewed and interpreted daily labs, tele strips, imagings as discussed above. I reviewed all nursing notes, pharmacy notes, vitals, pertinent old records I have discussed plan of care as described above with RN and patient/family.  CBC: Recent Labs  Lab  08/17/22 0556 08/17/22 1702 08/19/22 0107  WBC 10.0 8.2 8.5  HGB 11.7* 10.4* 11.1*  HCT 34.6* 31.3* 33.2*  MCV 91.3 90.5 91.0  PLT 206 204 187   Basic Metabolic Panel: Recent Labs  Lab 08/17/22 0556 08/17/22 1702 08/18/22 0052 08/18/22 1410 08/18/22 2010 08/19/22 0107  NA 141  --  138 136  --  136  K 3.7  --  2.8* 3.3*  --  3.6  CL 108  --  106 105  --  107  CO2 22  --  20* 22  --  20*  GLUCOSE 116*  --  108* 137*  --  99  BUN 43*  --  37* 42*  --  46*  CREATININE 4.12* 3.90* 3.74* 4.01*  --  3.82*  CALCIUM 7.3*  --  6.8* 6.9*  --  7.3*  MG  --   --  1.3*  --  1.8 1.9  PHOS  --   --  3.0  --   --  3.6    Studies: No results found.  Scheduled Meds:  calcium carbonate  1 tablet Oral BID WC   cyanocobalamin  1,000 mcg Intramuscular Q1200   Followed by   Melene Muller ON 08/25/2022] vitamin B-12  1,000 mcg Oral Daily   heparin  5,000 Units Subcutaneous Q8H   hydrALAZINE  75 mg Oral Q8H   [  START ON 08/20/2022] isosorbide mononitrate  60 mg Oral Daily   [START ON 08/20/2022] metoprolol succinate  50 mg Oral Daily   metoprolol tartrate  50 mg Oral BID   potassium chloride  20 mEq Oral BID   sodium chloride flush  3 mL Intravenous Q12H   spironolactone  25 mg Oral QPM   Vitamin D (Ergocalciferol)  50,000 Units Oral Q7 days   Continuous Infusions:  sodium chloride     iron sucrose Stopped (08/19/22 1015)   nitroGLYCERIN Stopped (08/17/22 1748)   PRN Meds: sodium chloride, acetaminophen **OR** acetaminophen, albuterol, labetalol, ondansetron **OR** ondansetron (ZOFRAN) IV, polyethylene glycol, sodium chloride flush  Time spent: 50 minutes  Author: Gillis Santa. MD [facility] 08/19/2022 2:11 PM  To reach On-call, see care teams to locate the attending and reach out to them via [email or web address]. If 7PM-7AM, please contact night-coverage If you still have difficulty reaching the attending provider, please page the [facility] (Director on Call) for [facility] on amion for  assistance.

## 2022-08-19 NOTE — TOC Initial Note (Signed)
Transition of Care [facility]) - Initial/Assessment Note    Patient Details  Name: Mark Hayden MRN: [id number] Date of Birth: 1975/09/05  Transition of [facility]) CM/SW Contact:    Tom-Johnson, Hershal Coria, RN Phone Number: 08/19/2022, 12:07 PM  Clinical Narrative:                  CM spoke with patient at bedside about needs for discharge transition.  Admitted for Acute CHF, on room air.  Patient states he lives alone at a [facility]. States he is almost close to being homeless. Employed, has three jobs but still unable to meet financial needs.  Does not have Medical Insurance and no PCP.  MATCH done for Prescription assistance. Patient informed he could apply for Medicaid, information given to patient at bedside.  Physiological scientist notified for [employer] eligibility.   Hospital F/u will be scheduled when patient Medically ready for discharge.   No PT/OT needs or recommendations noted at this time.  CM will continue to follow as patient progresses with care towards discharge.      Barriers to Discharge: Continued Medical Work up   Patient Goals and CMS Choice Patient states their goals for this hospitalization and ongoing recovery are:: To return home CMS Medicare.gov Compare Post Acute Care list provided to:: Patient Choice offered to / list presented to : Patient      Expected Discharge Plan and Services In-house Referral: Artist, PCP / [facility] Discharge Planning Services: CM Consult, Follow-up appt scheduled, MATCH Program   Living arrangements for the past 2 months: Mobile Home                 DME Arranged: N/A DME Agency: NA                  Prior Living Arrangements/Services Living arrangements for the past 2 months: Mobile Home Lives with:: Self Patient language and need for interpreter reviewed:: Yes Do you feel safe going back to the place where you live?: Yes      Need for Family Participation in Patient Care: Yes  (Comment) Care giver support system in place?: Yes (comment)   Criminal Activity/Legal Involvement Pertinent to Current Situation/Hospitalization: No - Comment as needed  Activities of Daily Living Home Assistive Devices/Equipment: None ADL Screening (condition at time of admission) Patient's cognitive ability adequate to safely complete daily activities?: Yes Is the patient deaf or have difficulty hearing?: No Does the patient have difficulty seeing, even when wearing glasses/contacts?: No Does the patient have difficulty concentrating, remembering, or making decisions?: No Patient able to express need for assistance with ADLs?: No Does the patient have difficulty dressing or bathing?: No Independently performs ADLs?: Yes (appropriate for developmental age) Does the patient have difficulty walking or climbing stairs?: No Weakness of Legs: None Weakness of Arms/Hands: None  Permission Sought/Granted Permission sought to share information with : Case Manager, Magazine features editor, Family Supports Permission granted to share information with : Yes, Verbal Permission Granted              Emotional Assessment Appearance:: Appears stated age Attitude/Demeanor/Rapport: Engaged, Gracious Affect (typically observed): Appropriate, Accepting, Hopeful, Pleasant Orientation: : Oriented to Self, Oriented to Place, Oriented to  Time, Oriented to Situation Alcohol / Substance Use: Not Applicable Psych Involvement: No (comment)  Admission diagnosis:  Acute CHF (congestive heart failure) (HCC) [I50.9] Hypertensive emergency [I16.1] Acute renal failure, unspecified acute renal failure type [facility]) [N17.9] Patient Active Problem List   Diagnosis Date  Noted   Acute CHF (congestive heart failure) (HCC) 08/17/2022   CKD (chronic kidney disease) stage 4, GFR 15-29 ml/min (HCC) 08/17/2022   Hypertensive urgency 09/08/2019   Suicidal thoughts 09/08/2019   Suicidal ideations    AKI (acute  kidney injury) (HCC)    PCP:  Patient, No Pcp Per Pharmacy:   [facility] [address] [place] - [address] [place] [address] Phone: [phone] Fax: [phone]     Social Determinants of Health (SDOH) Social History: SDOH Screenings   Food Insecurity: Food Insecurity Present (08/17/2022)  Housing: High Risk (08/17/2022)  Transportation Needs: Unmet Transportation Needs (08/17/2022)  Utilities: At Risk (08/17/2022)  Tobacco Use: Medium Risk (08/17/2022)   SDOH Interventions:     Readmission Risk Interventions     No data to display

## 2022-08-20 ENCOUNTER — Encounter (HOSPITAL_COMMUNITY): Payer: Self-pay | Admitting: Internal Medicine

## 2022-08-20 ENCOUNTER — Other Ambulatory Visit (HOSPITAL_COMMUNITY): Payer: Self-pay

## 2022-08-20 DIAGNOSIS — N189 Chronic kidney disease, unspecified: Secondary | ICD-10-CM

## 2022-08-20 DIAGNOSIS — I161 Hypertensive emergency: Secondary | ICD-10-CM

## 2022-08-20 DIAGNOSIS — D509 Iron deficiency anemia, unspecified: Secondary | ICD-10-CM

## 2022-08-20 DIAGNOSIS — I5033 Acute on chronic diastolic (congestive) heart failure: Secondary | ICD-10-CM | POA: Diagnosis not present

## 2022-08-20 DIAGNOSIS — N179 Acute kidney failure, unspecified: Secondary | ICD-10-CM | POA: Diagnosis not present

## 2022-08-20 HISTORY — DX: Hypertensive emergency: I16.1

## 2022-08-20 LAB — CBC
HCT: 35.5 % — ABNORMAL LOW (ref 39.0–52.0)
Hemoglobin: 11.8 g/dL — ABNORMAL LOW (ref 13.0–17.0)
MCH: 30.8 pg (ref 26.0–34.0)
MCHC: 33.2 g/dL (ref 30.0–36.0)
MCV: 92.7 fL (ref 80.0–100.0)
Platelets: 206 10*3/uL (ref 150–400)
RBC: 3.83 MIL/uL — ABNORMAL LOW (ref 4.22–5.81)
RDW: 13.2 % (ref 11.5–15.5)
WBC: 9.2 10*3/uL (ref 4.0–10.5)
nRBC: 0 % (ref 0.0–0.2)

## 2022-08-20 LAB — BASIC METABOLIC PANEL
Anion gap: 10 (ref 5–15)
BUN: 44 mg/dL — ABNORMAL HIGH (ref 6–20)
CO2: 19 mmol/L — ABNORMAL LOW (ref 22–32)
Calcium: 8.3 mg/dL — ABNORMAL LOW (ref 8.9–10.3)
Chloride: 107 mmol/L (ref 98–111)
Creatinine, Ser: 3.53 mg/dL — ABNORMAL HIGH (ref 0.61–1.24)
GFR, Estimated: 21 mL/min — ABNORMAL LOW (ref >60)
Glucose, Bld: 99 mg/dL (ref 70–99)
Potassium: 4.4 mmol/L (ref 3.5–5.1)
Sodium: 136 mmol/L (ref 135–145)

## 2022-08-20 LAB — PHOSPHORUS: Phosphorus: 3.2 mg/dL (ref 2.5–4.6)

## 2022-08-20 LAB — MAGNESIUM: Magnesium: 2 mg/dL (ref 1.7–2.4)

## 2022-08-20 MED ORDER — AMLODIPINE BESYLATE 10 MG PO TABS: 10.0000 mg | ORAL_TABLET | Freq: Every day | ORAL | Status: DC

## 2022-08-20 MED ORDER — ISOSORBIDE MONONITRATE ER 60 MG PO TB24
60.0000 mg | ORAL_TABLET | Freq: Every day | ORAL | 0 refills | Status: DC
  Filled 2022-08-20: qty 30, 30d supply, fill #0

## 2022-08-20 MED ORDER — HYDRALAZINE HCL 50 MG PO TABS
75.0000 mg | ORAL_TABLET | Freq: Three times a day (TID) | ORAL | 0 refills | Status: DC
  Filled 2022-08-20: qty 135, 30d supply, fill #0

## 2022-08-20 MED ORDER — FUROSEMIDE 40 MG PO TABS
40.0000 mg | ORAL_TABLET | Freq: Every day | ORAL | Status: DC
  Administered 2022-08-20: 40 mg via ORAL
  Filled 2022-08-20: qty 1

## 2022-08-20 MED ORDER — METOPROLOL SUCCINATE ER 50 MG PO TB24
50.0000 mg | ORAL_TABLET | Freq: Every day | ORAL | 0 refills | Status: DC
  Filled 2022-08-20: qty 30, 30d supply, fill #0

## 2022-08-20 MED ORDER — SODIUM BICARBONATE 650 MG PO TABS
650.0000 mg | ORAL_TABLET | Freq: Two times a day (BID) | ORAL | Status: DC
  Administered 2022-08-20: 650 mg via ORAL
  Filled 2022-08-20: qty 1

## 2022-08-20 MED ORDER — FUROSEMIDE 40 MG PO TABS
40.0000 mg | ORAL_TABLET | Freq: Every day | ORAL | 0 refills | Status: DC
  Filled 2022-08-20: qty 30, 30d supply, fill #0

## 2022-08-20 NOTE — Assessment & Plan Note (Signed)
Patient had IV iron during this hospitalization.

## 2022-08-20 NOTE — Assessment & Plan Note (Addendum)
Blood pressure has improved.  Plan to continue with metoprolol, hydralazine and isosorbide. Hold on spironolactone due to low GFR and risk of hyperkalemia.  Will resume amlodipine for blood pressure control.

## 2022-08-20 NOTE — Progress Notes (Signed)
Patient discharged to home, AVS reviewed and taxi voucher provided. TOC delivered medications. All  questions answered. [employer] called to provide transportation

## 2022-08-20 NOTE — Progress Notes (Addendum)
   Heart Failure Stewardship Pharmacist Progress Note   PCP: Patient, No Pcp Per PCP-Cardiologist: None    HPI:  47 yo M with PMH of HTN, substance abuse, CKD IV, and medication noncompliance.   Presented to the ED on 5/31 with shortness of breath, orthopnea, LE edema, and chest discomfort. He states he has been out of his medications for about 2 weeks but was rationing it beforehand and missing doses. In the ED, BP was 233/148, creatinine >4. Started on NTG drip for hypertensive emergency. BNP elevated, troponin 117, and CXR suggestive of CHF. ECHO 6/1 showed LVEF 45-50%, global hypokinesis, severe LVH, RV normal, moderate to severe MR.   Current HF Medications: Beta Blocker: metoprolol XL 50 mg daily MRA: spironolactone 25 mg daily Other: hydralazine 75 mg TID + Imdur 60 mg daily; iron sucrose 200 mg IV daily x 5  Prior to admission HF Medications: Beta blocker: carvedilol 12.5 mg BID MRA: spironolactone 50 mg daily  Pertinent Lab Values: Serum creatinine 3.53, BUN 44, Potassium 4.4, Sodium 136, BNP 363.8, Magnesium 2.0, A1c 5.5   Vital Signs: Weight: 202 lbs (admission weight: 206 lbs) Blood pressure: 160/100s  Heart rate: 70-80s  I/O: -0.4L yesterday; net -5.4L  Medication Assistance / Insurance Benefits Check: Does the patient have prescription insurance?  No  Does the patient qualify for medication assistance through manufacturers or grants?   Yes Eligible grants and/or patient assistance programs: pending Medication assistance applications in progress: none  Medication assistance applications approved: none Approved medication assistance renewals will be completed by: pending  Outpatient Pharmacy:  Prior to admission outpatient pharmacy: [facility] Is the patient willing to use [facility] TOC pharmacy at discharge? Yes Is the patient willing to transition their outpatient pharmacy to utilize a [facility] outpatient pharmacy?   Pending    Assessment: 1. Acute HFmrEF (LVEF  45-50%). NYHA class II symptoms. - Off IV lasix. SCr improved 3.82>3.53 today. Strict I/Os and daily weights. Keep K>4 and Mg>2. - Agree with transitioning to metoprolol XL 50 mg daily - No ACE/ARB/ARNI until creatinine stabilizes - Continue spironolactone 25 mg daily - Continue hydralazine 75 mg TID - Agree with adding Imdur 60 mg daily - Continue iron sucrose 200 mg IV daily x 5 days   Plan: 1) Medication changes recommended at this time: - Continue current regimen - May need to increase hydralazine tomorrow pending BP  2) Patient assistance: - Uninsured, no assistance pending  3)  Education  - Patient has been educated on current HF medications and potential additions to HF medication regimen - Patient verbalizes understanding that over the next few months, these medication doses may change and more medications may be added to optimize HF regimen - Patient has been educated on basic disease state pathophysiology and goals of therapy   Sharen Hones, PharmD, BCPS Heart Failure Stewardship Pharmacist Phone [phone]

## 2022-08-20 NOTE — Progress Notes (Signed)
Subjective:  Patient complains of pain at IV site while receiving iron infusion. Otherwise no acute complaints today. He asks why his BP spikes overnight. Explained that timing of BP medications may affect timing of elevated BP readings. Also learned that he snores and may have OSA, and explained that sleep apnea also contributes to elevated BP.  Objective Vital signs in last 24 hours: Vitals:   08/19/22 1525 08/19/22 1952 08/20/22 0033 08/20/22 0410  BP: (!) 158/96 (!) 171/103 (!) 179/105 (!) 172/109  Pulse:  89 81 91  Resp:  18 18 19   Temp:  98.8 F (37.1 C) 98.5 F (36.9 C) 98.6 F (37 C)  TempSrc:  Oral Oral Oral  SpO2:  98% 100% 100%  Weight:    91.7 kg  Height:       Weight change: 0.436 kg  Intake/Output Summary (Last 24 hours) at 08/20/2022 1610 Last data filed at 08/20/2022 0415 Gross per 24 hour  Intake 1290 ml  Output 1150 ml  Net 140 ml    Assessment/Plan:   Mark Hayden is a 47 y.o. M with history of chronically poorly controlled HTN with AKI in setting of hypertensive urgency.  Renal: History of CKD stage 4 in the setting of uncontrolled HTN. He reportedly ran out of his BP medications 2 weeks prior, though he was inconsistently taking his medications, not taking them daily. This is not his first presentation of AKI on CKD 2/2 hypertensive urgency--was admitted in 10/2021 with this as well. Urine studies unremarkable, renal ultrasound unremarkable. Renal function today improved from prior with Scr 3.53 (4.12 on admission). Plan to continue to optimize BP; no indication at this time for HD. Hopeful for ongoing improvement with stabilization of BP.  2. Hypertension/volume   BP improving with some readings of SBP 170s-190s. Current regimen is hydralazine 75 mg TID (increased from 50 mg TID starting today), imdur 60 mg daily, metoprolol 50 mg daily, spironolactone 25 mg daily.  Echocardiogram remarkable for LV EF 45-50% with global hypokineses and severe LVH, mild  dilation of LA, moderate-severe MV regurgitation, severe PV regurgitation.  On exam, volume status is euvolemic. Net -5.426L this admission with -1150L 06/03. Weight is -13.3 kg.   If BP responds well to increased dose of hydralazine throughout course of day today, nephrology will sign off with plans for close follow-up with [facility] outpatient. The importance of taking his blood pressure medication and following up OP has been explained to the patient. Would also consider referring for sleep study to assess for OSA.  Would recommend the following OP anti-HTN regimen: metoprolol 50 mg daily, imdur 30 mg every morning, hydralazine 75 mg BID, and spironolactone 25 mg every evening. Recommend home BP cuff and log as well.  3. Anemia: Stable.Continue iron supplementation as outpatient. Recommend colonoscopy if deficiency persists.   Labs: Basic Metabolic Panel: Recent Labs  Lab 08/18/22 0052 08/18/22 1410 08/19/22 0107 08/20/22 0125  NA 138 136 136 136  K 2.8* 3.3* 3.6 4.4  CL 106 105 107 107  CO2 20* 22 20* 19*  GLUCOSE 108* 137* 99 99  BUN 37* 42* 46* 44*  CREATININE 3.74* 4.01* 3.82* 3.53*  CALCIUM 6.8* 6.9* 7.3* 8.3*  PHOS 3.0  --  3.6 3.2    Liver Function Tests: Recent Labs  Lab 08/17/22 0601 08/18/22 0052  AST 22  --   ALT 19  --   ALKPHOS 114  --   BILITOT 0.3  --   PROT 7.2  --  ALBUMIN 4.2 3.5    No results for input(s): "LIPASE", "AMYLASE" in the last 168 hours. No results for input(s): "AMMONIA" in the last 168 hours. CBC: Recent Labs  Lab 08/17/22 0556 08/17/22 1702 08/19/22 0107 08/20/22 0125  WBC 10.0 8.2 8.5 9.2  HGB 11.7* 10.4* 11.1* 11.8*  HCT 34.6* 31.3* 33.2* 35.5*  MCV 91.3 90.5 91.0 92.7  PLT 206 204 187 206    Cardiac Enzymes: No results for input(s): "CKTOTAL", "CKMB", "CKMBINDEX", "TROPONINI" in the last 168 hours. CBG: No results for input(s): "GLUCAP" in the last 168 hours.  Iron Studies:  Recent Labs    08/18/22 1410  IRON 17*   TIBC 277    Studies/Results: ECHOCARDIOGRAM COMPLETE  Result Date: 08/18/2022    ECHOCARDIOGRAM REPORT   Patient Name:   [facility] Date of Exam: 08/18/2022 Medical Rec #:  [id number]             Height:       71.0 in Accession #:    [id number]            Weight:       201.2 lb Date of Birth:  1975/04/13            BSA:          2.114 m Patient Age:    47 years              BP:           185/110 mmHg Patient Gender: M                     HR:           90 bpm. Exam Location:  Inpatient Procedure: 2D Echo, Cardiac Doppler, Color Doppler and Strain Analysis Indications:    CHF- Acute Diastolic  History:        Patient has no prior history of Echocardiogram examinations.                 CHF, Acute Kidney Disease; Risk Factors:Hypertension and Former                 Smoker.  Sonographer:    Raeford Razor Referring Phys: Sharla Kidney Werner Lean GHIMIRE IMPRESSIONS  1. Left ventricular ejection fraction, by estimation, is 45 to 50%. The left ventricle has mildly decreased function. The left ventricle demonstrates global hypokinesis. There is severe left ventricular hypertrophy. Left ventricular diastolic parameters  are indeterminate. The average left ventricular global longitudinal strain is -15.8 %. The global longitudinal strain is abnormal.  2. Right ventricular systolic function is normal. The right ventricular size is normal.  3. Left atrial size was mildly dilated.  4. The mitral valve is normal in structure. Moderate to severe mitral valve regurgitation. No evidence of mitral stenosis.  5. The aortic valve is tricuspid. Aortic valve regurgitation is not visualized. No aortic stenosis is present.  6. Pulmonic valve regurgitation is severe.  7. The inferior vena cava is normal in size with greater than 50% respiratory variability, suggesting right atrial pressure of 3 mmHg. FINDINGS  Left Ventricle: Left ventricular ejection fraction, by estimation, is 45 to 50%. The left ventricle has mildly decreased function.  The left ventricle demonstrates global hypokinesis. The average left ventricular global longitudinal strain is -15.8 %. The global longitudinal strain is abnormal. The left ventricular internal cavity size was normal in size. There is severe left ventricular hypertrophy. Left ventricular diastolic parameters are indeterminate. Right Ventricle: The right  ventricular size is normal. No increase in right ventricular wall thickness. Right ventricular systolic function is normal. Left Atrium: Left atrial size was mildly dilated. Right Atrium: Right atrial size was normal in size. Pericardium: There is no evidence of pericardial effusion. Mitral Valve: The mitral valve is normal in structure. Moderate to severe mitral valve regurgitation. No evidence of mitral valve stenosis. Tricuspid Valve: The tricuspid valve is normal in structure. Tricuspid valve regurgitation is mild . No evidence of tricuspid stenosis. Aortic Valve: The aortic valve is tricuspid. Aortic valve regurgitation is not visualized. No aortic stenosis is present. Aortic valve mean gradient measures 5.0 mmHg. Aortic valve peak gradient measures 8.4 mmHg. Aortic valve area, by VTI measures 2.98 cm. Pulmonic Valve: The pulmonic valve was normal in structure. Pulmonic valve regurgitation is severe. No evidence of pulmonic stenosis. Aorta: The aortic root is normal in size and structure. Venous: The inferior vena cava is normal in size with greater than 50% respiratory variability, suggesting right atrial pressure of 3 mmHg. IAS/Shunts: No atrial level shunt detected by color flow Doppler.  LEFT VENTRICLE PLAX 2D LVIDd:         4.40 cm      Diastology LVIDs:         3.40 cm      LV e' medial:    7.94 cm/s LV PW:         1.90 cm      LV E/e' medial:  16.4 LV IVS:        2.00 cm      LV e' lateral:   13.60 cm/s LVOT diam:     2.30 cm      LV E/e' lateral: 9.6 LV SV:         71 LV SV Index:   34           2D Longitudinal Strain LVOT Area:     4.15 cm     2D  Strain GLS Avg:     -15.8 %  LV Volumes (MOD) LV vol d, MOD A2C: 140.0 ml LV vol d, MOD A4C: 110.0 ml LV vol s, MOD A2C: 70.4 ml LV vol s, MOD A4C: 63.5 ml LV SV MOD A2C:     69.6 ml LV SV MOD A4C:     110.0 ml LV SV MOD BP:      55.3 ml RIGHT VENTRICLE             IVC RV Basal diam:  2.80 cm     IVC diam: 1.60 cm RV S prime:     13.10 cm/s TAPSE (M-mode): 2.2 cm LEFT ATRIUM             Index        RIGHT ATRIUM           Index LA diam:        4.40 cm 2.08 cm/m   RA Area:     15.00 cm LA Vol (A2C):   90.1 ml 42.62 ml/m  RA Volume:   37.70 ml  17.83 ml/m LA Vol (A4C):   70.0 ml 33.11 ml/m LA Biplane Vol: 80.0 ml 37.84 ml/m  AORTIC VALVE                    PULMONIC VALVE AV Area (Vmax):    2.89 cm     PV Vmax:       0.92 m/s AV Area (Vmean):   2.81 cm  PV Peak grad:  3.4 mmHg AV Area (VTI):     2.98 cm AV Vmax:           145.00 cm/s AV Vmean:          98.600 cm/s AV VTI:            0.240 m AV Peak Grad:      8.4 mmHg AV Mean Grad:      5.0 mmHg LVOT Vmax:         101.00 cm/s LVOT Vmean:        66.600 cm/s LVOT VTI:          0.172 m LVOT/AV VTI ratio: 0.72  AORTA Ao Root diam: 2.80 cm Ao Asc diam:  3.20 cm MITRAL VALVE MV Area (PHT): 5.62 cm       SHUNTS MV Decel Time: 135 msec       Systemic VTI:  0.17 m MR Peak grad:    112.1 mmHg   Systemic Diam: 2.30 cm MR Mean grad:    83.0 mmHg MR Vmax:         529.50 cm/s MR Vmean:        439.0 cm/s MR PISA:         0.57 cm MR PISA Eff ROA: 4 mm MR PISA Radius:  0.30 cm MV E velocity: 130.00 cm/s MV A velocity: 72.30 cm/s MV E/A ratio:  1.80 Donato Schultz MD Electronically signed by Donato Schultz MD Signature Date/Time: 08/18/2022/10:51:54 AM    Final     Medications: Infusions:  sodium chloride     iron sucrose Stopped (08/19/22 1015)   nitroGLYCERIN Stopped (08/17/22 1748)    Scheduled Medications:  calcium carbonate  1 tablet Oral BID WC   cyanocobalamin  1,000 mcg Intramuscular Q1200   Followed by   Melene Muller ON 08/25/2022] vitamin B-12  1,000 mcg Oral Daily    heparin  5,000 Units Subcutaneous Q8H   hydrALAZINE  75 mg Oral Q8H   isosorbide mononitrate  60 mg Oral Daily   metoprolol succinate  50 mg Oral Daily   potassium chloride  20 mEq Oral BID   sodium chloride flush  3 mL Intravenous Q12H   spironolactone  25 mg Oral QPM   Vitamin D (Ergocalciferol)  50,000 Units Oral Q7 days   I have reviewed scheduled and prn medications.  Physical Exam: Constitutional:Resting comfortably, in no acute distress. Cardio:Regular rate and rhythm.  Pulm:Normal work of breathing on room air. ZOX:WRUEAVWU for extremity edema. Skin:Warm and dry. Neuro:Alert and oriented x3. No focal deficit noted. Psych:Normal mood and affect.  Champ Mungo, DO Internal Medicine PGY-2 08/20/2022,8:28 AM  LOS: 3 days

## 2022-08-20 NOTE — TOC Transition Note (Addendum)
Transition of Care [facility]) - CM/SW Discharge Note   Patient Details  Name: Mark Hayden MRN: [id number] Date of Birth: 05-24-1975  Transition of Care [facility]) CM/SW Contact:  Leone Haven, RN Phone Number: 08/20/2022, 3:00 PM   Clinical Narrative:    Patient is for dc today, he has follow up apt on AVS, Match has been done and TOC to fill medications for him.  Patient states his car is at [facility] and he needs a cab to get back there to get his car.  NCM will assist him with a cab voucher. Patient needs voucher to get to [facility] at [facility] where he left his car.      Barriers to Discharge: Continued Medical Work up   Patient Goals and CMS Choice CMS Medicare.gov Compare Post Acute Care list provided to:: Patient Choice offered to / list presented to : Patient  Discharge Placement                         Discharge Plan and Services Additional resources added to the After Visit Summary for   In-house Referral: Financial Counselor, PCP / [facility] Discharge Planning Services: CM Consult, Follow-up appt scheduled, MATCH Program            DME Arranged: N/A DME Agency: NA                  Social Determinants of Health (SDOH) Interventions SDOH Screenings   Food Insecurity: No Food Insecurity (08/20/2022)  Recent Concern: Food Insecurity - Food Insecurity Present (08/17/2022)  Housing: Low Risk  (08/20/2022)  Recent Concern: Housing - High Risk (08/17/2022)  Transportation Needs: No Transportation Needs (08/20/2022)  Recent Concern: Transportation Needs - Unmet Transportation Needs (08/17/2022)  Utilities: Not At Risk (08/20/2022)  Recent Concern: Utilities - At Risk (08/17/2022)  Alcohol Screen: Low Risk  (08/20/2022)  Financial Resource Strain: High Risk (08/20/2022)  Tobacco Use: Medium Risk (08/20/2022)     Readmission Risk Interventions     No data to display

## 2022-08-20 NOTE — Hospital Course (Addendum)
[name] was admitted to the hospital with the working diagnosis of heart failure decompensation.   47 yo make with the past medical history of hypertension, CKD stage IV, and marijuana abuse who presented with dyspnea. Apparently he run out his blood pressure medications 2 weeks prior to presentation. Over last 2 days, he had worsening dyspnea on exertion, PND and orthopnea. Positive lower extremity edema. On his initial physical examination his systolic blood pressure was 230's, heart rate 88, RR 18 and 02 saturation 97%, lungs with no wheezing or rales, heart with S1 and S2 present and rhythmic, abdomen with no distention and no lower extremity edema.   Na 141, K 3,7 Cl 108 bicarbonate 22, glucose 116 bun 43 cr 4.12  BNP 363  High sensitive troponin 117 and 107  Wbc 10,0 hgb 11,7 plt 206  Urine analysis SG 1,009, negative protein, negative leukocytes and negative hgb.   Chest radiograph with mild cardiomegaly, bilateral hilar vascular congestion, cephalization of the vasculature, fluid in the right fissure, bilateral central interstitial infiltrates.   EKG 105 bpm, normal axis, normal intervals, sinus rhythm with J point elevation V1 to V3, no significant ST segment changes, negative T wave I and AvL. Positive LVH.   Patient was placed on nitroglycerin drip for blood pressure control, then transitioned to oral agents with good toleration. Nephrology was consulted.

## 2022-08-20 NOTE — Assessment & Plan Note (Addendum)
CKD stage 4, hypokalemia.   At the time if his discharge his renal function has improved, with serum cr at 3,53 with K at 4,4 and serum bicarbonate at 19, Na 136 and Mg, 2.0  Plan to continue close follow up on renal function and electrolytes.  K was corrected with Kcl. Continue diuresis with furosemide.

## 2022-08-20 NOTE — Assessment & Plan Note (Signed)
Echocardiogram with mild reduction in systolic function with EF 45 to 50%, global hypokinesis, severe LVH, RV with normal systolic function, LA with mild dilatation, moderate to severe mitral regurgitation.   Patient was placed on furosemide, negative fluid balance was achieved, - 5,776 ml, with significant improvement in his symptoms.  Systolic blood pressure 150 to 160 mmHg.   Plan to continue with hydralazine and isosorbide. Metoprolol for B blockade. Diuresis with furosemide 40 mg po daily. Hold on spironolactone due to risk of hyperkalemia. Possible addition of SGLT 2 inh as outpatient.

## 2022-08-20 NOTE — Discharge Summary (Addendum)
Physician Discharge Summary   Patient: Mark Hayden MRN: [id number] DOB: 06-01-1975  Admit date:     08/17/2022  Discharge date: 08/20/22  Discharge Physician: York Ram Rykin Route   PCP: Patient, No Pcp Per   Recommendations at discharge:    Patient has been placed on antihypertensive regimen with metoprolol, hydralazine, isosorbide and diuresis with furosemide. Follow up with primary care in 7 to 10 days. Follow up renal function and electrolytes in 7 days as outpatient.  Follow up with nephrology as outpatient.    Discharge Diagnoses: Principal Problem:   Acute on chronic diastolic CHF (congestive heart failure) (HCC) Active Problems:   Acute kidney injury superimposed on chronic kidney disease (HCC)   Hypertensive emergency   Iron deficiency anemia  Resolved Problems:   * No resolved hospital problems. [facility] Course: [name] was admitted to the hospital with the working diagnosis of heart failure decompensation.   47 yo make with the past medical history of hypertension, CKD stage IV, and marijuana abuse who presented with dyspnea. Apparently he run out his blood pressure medications 2 weeks prior to presentation. Over last 2 days, he had worsening dyspnea on exertion, PND and orthopnea. Positive lower extremity edema. On his initial physical examination his systolic blood pressure was 230's, heart rate 88, RR 18 and 02 saturation 97%, lungs with no wheezing or rales, heart with S1 and S2 present and rhythmic, abdomen with no distention and no lower extremity edema.   Na 141, K 3,7 Cl 108 bicarbonate 22, glucose 116 bun 43 cr 4.12  BNP 363  High sensitive troponin 117 and 107  Wbc 10,0 hgb 11,7 plt 206  Urine analysis SG 1,009, negative protein, negative leukocytes and negative hgb.   Chest radiograph with mild cardiomegaly, bilateral hilar vascular congestion, cephalization of the vasculature, fluid in the right fissure, bilateral central  interstitial infiltrates.   EKG 105 bpm, normal axis, normal intervals, sinus rhythm with J point elevation V1 to V3, no significant ST segment changes, negative T wave I and AvL. Positive LVH.   Patient was placed on nitroglycerin drip for blood pressure control, then transitioned to oral agents with good toleration. Nephrology was consulted.   Assessment and Plan: * Acute on chronic diastolic CHF (congestive heart failure) (HCC) Echocardiogram with mild reduction in systolic function with EF 45 to 50%, global hypokinesis, severe LVH, RV with normal systolic function, LA with mild dilatation, moderate to severe mitral regurgitation.   Patient was placed on furosemide, negative fluid balance was achieved, - 5,776 ml, with significant improvement in his symptoms.  Systolic blood pressure 150 to 160 mmHg.   Plan to continue with hydralazine and isosorbide. Metoprolol for B blockade. Diuresis with furosemide 40 mg po daily. Hold on spironolactone due to risk of hyperkalemia. Possible addition of SGLT 2 inh as outpatient.    Acute kidney injury superimposed on chronic kidney disease (HCC) CKD stage 4, hypokalemia.   At the time if his discharge his renal function has improved, with serum cr at 3,53 with K at 4,4 and serum bicarbonate at 19, Na 136 and Mg, 2.0  Plan to continue close follow up on renal function and electrolytes.  K was corrected with Kcl. Continue diuresis with furosemide.   Hypertensive emergency Blood pressure has improved.  Plan to continue with metoprolol, hydralazine and isosorbide. Hold on spironolactone due to low GFR and risk of hyperkalemia.  Will resume amlodipine for blood pressure control.   Iron deficiency anemia Patient had  IV iron during this hospitalization.          Consultants: nephrology  Procedures performed: none   Disposition: Home Diet recommendation:  Discharge Diet Orders (From admission, onward)     Start     Ordered    08/20/22 0000  Diet - low sodium heart healthy        08/20/22 1453           Cardiac diet DISCHARGE MEDICATION: Allergies as of 08/20/2022       Reactions   Shellfish Allergy Anaphylaxis, Itching   seafood        Medication List     STOP taking these medications    amLODipine 5 MG tablet Commonly known as: NORVASC   aspirin 81 MG chewable tablet   carvedilol 12.5 MG tablet Commonly known as: COREG   cephALEXin 500 MG capsule Commonly known as: KEFLEX   escitalopram 10 MG tablet Commonly known as: Lexapro   spironolactone 50 MG tablet Commonly known as: ALDACTONE       TAKE these medications    furosemide 40 MG tablet Commonly known as: LASIX Take 1 tablet (40 mg total) by mouth daily.   hydrALAZINE 50 MG tablet Commonly known as: APRESOLINE Take 1.5 tablets (75 mg total) by mouth every 8 (eight) hours. What changed:  medication strength how much to take when to take this   isosorbide mononitrate 60 MG 24 hr tablet Commonly known as: IMDUR Take 1 tablet (60 mg total) by mouth daily. Start taking on: August 21, 2022   metoprolol succinate 50 MG 24 hr tablet Commonly known as: TOPROL-XL Take 1 tablet (50 mg total) by mouth daily. Take with or immediately following a meal. Start taking on: August 21, 2022        Follow-up Information     [facility]. Go on 08/30/2022.   Why: @10 :45am Contact information: [address] [place] [address] [phone]        [facility]. Go in 16 day(s).   Specialty: Cardiology Why: Hospital follow up 09/05/2022 @ 3 pm PLEASE bring a current medication list to appointment FREE valet parking, Entrance C, off [employer] information: [address] [id number] mc [place] [address] [phone]               Discharge Exam: Filed Weights   08/18/22 0526 08/19/22 0453 08/20/22 0410   Weight: 91.3 kg 91.3 kg 91.7 kg   BP (!) 154/96   Pulse 76   Temp 98.6 F (37 C) (Oral)   Resp 19   Ht 5\' 11"  (1.803 m)   Wt 91.7 kg   SpO2 100%   BMI 28.20 kg/m   Patient is feeling better, no chest pain or dyspnea.  Neurology awake and alert ENT with mild pallor Cardiovascular with S1 and S2 present and rhythmic  Respiratory with no rales or wheezing, no rhonchi Abdomen with no distention  No lower extremity edema   Condition at discharge: stable  The results of significant diagnostics from this hospitalization (including imaging, microbiology, ancillary and laboratory) are listed below for reference.   Imaging Studies: ECHOCARDIOGRAM COMPLETE  Result Date: 08/18/2022    ECHOCARDIOGRAM REPORT   Patient Name:   [facility] Date of Exam: 08/18/2022 Medical Rec #:  [id number]             Height:       71.0 in Accession #:  [id number]            Weight:       201.2 lb Date of Birth:  1975/05/22            BSA:          2.114 m Patient Age:    46 years              BP:           185/110 mmHg Patient Gender: M                     HR:           90 bpm. Exam Location:  Inpatient Procedure: 2D Echo, Cardiac Doppler, Color Doppler and Strain Analysis Indications:    CHF- Acute Diastolic  History:        Patient has no prior history of Echocardiogram examinations.                 CHF, Acute Kidney Disease; Risk Factors:Hypertension and Former                 Smoker.  Sonographer:    Raeford Razor Referring Phys: Sharla Kidney Werner Lean GHIMIRE IMPRESSIONS  1. Left ventricular ejection fraction, by estimation, is 45 to 50%. The left ventricle has mildly decreased function. The left ventricle demonstrates global hypokinesis. There is severe left ventricular hypertrophy. Left ventricular diastolic parameters  are indeterminate. The average left ventricular global longitudinal strain is -15.8 %. The global longitudinal strain is abnormal.  2. Right ventricular systolic function is normal. The right  ventricular size is normal.  3. Left atrial size was mildly dilated.  4. The mitral valve is normal in structure. Moderate to severe mitral valve regurgitation. No evidence of mitral stenosis.  5. The aortic valve is tricuspid. Aortic valve regurgitation is not visualized. No aortic stenosis is present.  6. Pulmonic valve regurgitation is severe.  7. The inferior vena cava is normal in size with greater than 50% respiratory variability, suggesting right atrial pressure of 3 mmHg. FINDINGS  Left Ventricle: Left ventricular ejection fraction, by estimation, is 45 to 50%. The left ventricle has mildly decreased function. The left ventricle demonstrates global hypokinesis. The average left ventricular global longitudinal strain is -15.8 %. The global longitudinal strain is abnormal. The left ventricular internal cavity size was normal in size. There is severe left ventricular hypertrophy. Left ventricular diastolic parameters are indeterminate. Right Ventricle: The right ventricular size is normal. No increase in right ventricular wall thickness. Right ventricular systolic function is normal. Left Atrium: Left atrial size was mildly dilated. Right Atrium: Right atrial size was normal in size. Pericardium: There is no evidence of pericardial effusion. Mitral Valve: The mitral valve is normal in structure. Moderate to severe mitral valve regurgitation. No evidence of mitral valve stenosis. Tricuspid Valve: The tricuspid valve is normal in structure. Tricuspid valve regurgitation is mild . No evidence of tricuspid stenosis. Aortic Valve: The aortic valve is tricuspid. Aortic valve regurgitation is not visualized. No aortic stenosis is present. Aortic valve mean gradient measures 5.0 mmHg. Aortic valve peak gradient measures 8.4 mmHg. Aortic valve area, by VTI measures 2.98 cm. Pulmonic Valve: The pulmonic valve was normal in structure. Pulmonic valve regurgitation is severe. No evidence of pulmonic stenosis. Aorta: The  aortic root is normal in size and structure. Venous: The inferior vena cava is normal in size with greater than 50% respiratory variability, suggesting right atrial pressure of 3 mmHg.  IAS/Shunts: No atrial level shunt detected by color flow Doppler.  LEFT VENTRICLE PLAX 2D LVIDd:         4.40 cm      Diastology LVIDs:         3.40 cm      LV e' medial:    7.94 cm/s LV PW:         1.90 cm      LV E/e' medial:  16.4 LV IVS:        2.00 cm      LV e' lateral:   13.60 cm/s LVOT diam:     2.30 cm      LV E/e' lateral: 9.6 LV SV:         71 LV SV Index:   34           2D Longitudinal Strain LVOT Area:     4.15 cm     2D Strain GLS Avg:     -15.8 %  LV Volumes (MOD) LV vol d, MOD A2C: 140.0 ml LV vol d, MOD A4C: 110.0 ml LV vol s, MOD A2C: 70.4 ml LV vol s, MOD A4C: 63.5 ml LV SV MOD A2C:     69.6 ml LV SV MOD A4C:     110.0 ml LV SV MOD BP:      55.3 ml RIGHT VENTRICLE             IVC RV Basal diam:  2.80 cm     IVC diam: 1.60 cm RV S prime:     13.10 cm/s TAPSE (M-mode): 2.2 cm LEFT ATRIUM             Index        RIGHT ATRIUM           Index LA diam:        4.40 cm 2.08 cm/m   RA Area:     15.00 cm LA Vol (A2C):   90.1 ml 42.62 ml/m  RA Volume:   37.70 ml  17.83 ml/m LA Vol (A4C):   70.0 ml 33.11 ml/m LA Biplane Vol: 80.0 ml 37.84 ml/m  AORTIC VALVE                    PULMONIC VALVE AV Area (Vmax):    2.89 cm     PV Vmax:       0.92 m/s AV Area (Vmean):   2.81 cm     PV Peak grad:  3.4 mmHg AV Area (VTI):     2.98 cm AV Vmax:           145.00 cm/s AV Vmean:          98.600 cm/s AV VTI:            0.240 m AV Peak Grad:      8.4 mmHg AV Mean Grad:      5.0 mmHg LVOT Vmax:         101.00 cm/s LVOT Vmean:        66.600 cm/s LVOT VTI:          0.172 m LVOT/AV VTI ratio: 0.72  AORTA Ao Root diam: 2.80 cm Ao Asc diam:  3.20 cm MITRAL VALVE MV Area (PHT): 5.62 cm       SHUNTS MV Decel Time: 135 msec       Systemic VTI:  0.17 m MR Peak grad:    112.1 mmHg   Systemic Diam: 2.30 cm MR  Mean grad:    83.0 mmHg MR Vmax:          529.50 cm/s MR Vmean:        439.0 cm/s MR PISA:         0.57 cm MR PISA Eff ROA: 4 mm MR PISA Radius:  0.30 cm MV E velocity: 130.00 cm/s MV A velocity: 72.30 cm/s MV E/A ratio:  1.80 Donato Schultz MD Electronically signed by Donato Schultz MD Signature Date/Time: 08/18/2022/10:51:54 AM    Final    US RENAL  Result Date: 08/17/2022 CLINICAL DATA:  Acute kidney injury EXAM: RENAL / URINARY TRACT ULTRASOUND COMPLETE COMPARISON:  None Available. FINDINGS: Right Kidney: Renal measurements: 8.7 x 5.2 x 5.2 cm = volume: 123.5 mL. Echogenicity within normal limits. No mass or hydronephrosis visualized. Left Kidney: Renal measurements: 9.9 x 4.7 x 4.4 cm = volume: 106.1 mL. Echogenicity within normal limits. No mass or hydronephrosis visualized. Bladder: Appears normal for degree of bladder distention. Bilateral jets visualized. Other: None. IMPRESSION: No hydronephrosis. Electronically Signed   By: Allegra Lai M.D.   On: 08/17/2022 20:20   DG Chest Port 1 View  Result Date: 08/17/2022 CLINICAL DATA:  [id number] with shortness of breath for 2 days, worse when lying flat. Ran out of BP meds 1.5 weeks ago. EXAM: PORTABLE CHEST 1 VIEW COMPARISON:  Portable chest 09/07/2019 FINDINGS: There is mild cardiomegaly with increased vascular engorgement, flow cephalization, and increased central and basilar interstitial edema with small pleural effusions. Perihilar hazy opacities are also noted on the right greater than left and may be due to ground-glass edema. Underlying pneumonia would be difficult to exclude given the asymmetry. The mediastinum is normally outlined. Thoracic cage is intact. Numerous overlying monitor wires are present. IMPRESSION: 1. Cardiomegaly with increased vascular engorgement, interstitial edema and small pleural effusions consistent with CHF or fluid overload. 2. Perihilar hazy opacities on the right greater than left may be due to ground-glass edema or pneumonia. Electronically Signed   By: Almira Bar M.D.   On: 08/17/2022 06:42    Microbiology: Results for orders placed or performed during the hospital encounter of 09/07/19  SARS Coronavirus 2 by RT PCR (hospital order, performed in [facility] hospital lab) Nasopharyngeal Nasopharyngeal Swab     Status: None   Collection Time: 09/07/19 10:25 PM   Specimen: Nasopharyngeal Swab  Result Value Ref Range Status   SARS Coronavirus 2 NEGATIVE NEGATIVE Final    Comment: (NOTE) SARS-CoV-2 target nucleic acids are NOT DETECTED.  The SARS-CoV-2 RNA is generally detectable in upper and lower respiratory specimens during the acute phase of infection. The lowest concentration of SARS-CoV-2 viral copies this assay can detect is 250 copies / mL. A negative result does not preclude SARS-CoV-2 infection and should not be used as the sole basis for treatment or other patient management decisions.  A negative result may occur with improper specimen collection / handling, submission of specimen other than nasopharyngeal swab, presence of viral mutation(s) within the areas targeted by this assay, and inadequate number of viral copies (<250 copies / mL). A negative result must be combined with clinical observations, patient history, and epidemiological information.  Fact Sheet for Patients:   BoilerBrush.com.cy  Fact Sheet for Healthcare Providers: https://pope.com/  This test is not yet approved or  cleared by the [place] FDA and has been authorized for detection and/or diagnosis of SARS-CoV-2 by FDA under an Emergency Use Authorization (EUA).  This EUA will remain in effect (meaning this  test can be used) for the duration of the COVID-19 declaration under Section 564(b)(1) of the Act, 21 U.S.C. section 360bbb-3(b)(1), unless the authorization is terminated or revoked sooner.  Performed at [facility], [address] [place], [place] [address]      Labs: CBC: Recent Labs  Lab 08/17/22 0556 08/17/22 1702 08/19/22 0107 08/20/22 0125  WBC 10.0 8.2 8.5 9.2  HGB 11.7* 10.4* 11.1* 11.8*  HCT 34.6* 31.3* 33.2* 35.5*  MCV 91.3 90.5 91.0 92.7  PLT 206 204 187 206   Basic Metabolic Panel: Recent Labs  Lab 08/17/22 0556 08/17/22 1702 08/18/22 0052 08/18/22 1410 08/18/22 2010 08/19/22 0107 08/20/22 0125  NA 141  --  138 136  --  136 136  K 3.7  --  2.8* 3.3*  --  3.6 4.4  CL 108  --  106 105  --  107 107  CO2 22  --  20* 22  --  20* 19*  GLUCOSE 116*  --  108* 137*  --  99 99  BUN 43*  --  37* 42*  --  46* 44*  CREATININE 4.12* 3.90* 3.74* 4.01*  --  3.82* 3.53*  CALCIUM 7.3*  --  6.8* 6.9*  --  7.3* 8.3*  MG  --   --  1.3*  --  1.8 1.9 2.0  PHOS  --   --  3.0  --   --  3.6 3.2   Liver Function Tests: Recent Labs  Lab 08/17/22 0601 08/18/22 0052  AST 22  --   ALT 19  --   ALKPHOS 114  --   BILITOT 0.3  --   PROT 7.2  --   ALBUMIN 4.2 3.5   CBG: No results for input(s): "GLUCAP" in the last 168 hours.  Discharge time spent: greater than 30 minutes.  Signed: Coralie Keens, MD [facility] 08/20/2022

## 2022-08-21 NOTE — Progress Notes (Signed)
Heart Failure Nurse Navigator Progress Note  PCP: Patient, No Pcp Per PCP-Cardiologist: None Admission Diagnosis: Hypertensive emergency, Acute renal failure.  Admitted from: Home  Presentation:   Mark Hayden presented with shortness of breat x 2 days, reported been out of medication for last 1 1/2 weeks, due to no refills or Insurance. Some BLE edema, BP 233/148,  HR 80, BNP 363.8, Troponin 117, Creat >4, nitro drip started, CXR suggestive of CHF.  Patient a very pleasant man was educated on the sign and symptoms of heart failure, daily weights, when to call his doctor or go to the ED, A scale was brought to bedside for home [place]. Diet/ fluid restrictions , patient reported to eating a lot of meals from work, Mark Hayden, with probably too much salt and soda, Spoke about taking all medications as prescribed,patient was given a pill box for home use,  and attending all medical appointments. Patient verbalized his understanding of education, a HF TOC appointment was scheduled for 09/05/2022 @ 3 pm.   ECHO/ LVEF: 45-50%  Clinical Course:  Past Medical History:  Diagnosis Date   Hypertension      Social History   Socioeconomic History   Marital status: Single    Spouse name: Not on file   Number of children: 1   Years of education: Not on file   Highest education level: High school graduate  Occupational History   Occupation: [employer]ody Hayden  Tobacco Use   Smoking status: Former   Smokeless tobacco: Never  Building services engineer Use: Never used  Substance and Sexual Activity   Alcohol use: No   Drug use: No   Sexual activity: Not on file  Other Topics Concern   Not on file  Social History Narrative   Not on file   Social Determinants of Health   Financial Resource Strain: High Risk (08/20/2022)   Overall Financial Resource Strain (CARDIA)    Difficulty of Paying Living Expenses: Very hard  Food Insecurity: No Food Insecurity (08/20/2022)   Hunger Vital Sign    Worried  About Running Out of Food in the Last Year: Never true    Ran Out of Food in the Last Year: Never true  Recent Concern: Food Insecurity - Food Insecurity Present (08/17/2022)   Hunger Vital Sign    Worried About Running Out of Food in the Last Year: Often true    Ran Out of Food in the Last Year: Often true  Transportation Needs: No Transportation Needs (08/20/2022)   PRAPARE - Administrator, Civil Service (Medical): No    Lack of Transportation (Non-Medical): No  Recent Concern: Transportation Needs - Unmet Transportation Needs (08/17/2022)   PRAPARE - Administrator, Civil Service (Medical): Yes    Lack of Transportation (Non-Medical): Yes  Physical Activity: Not on file  Stress: Not on file  Social Connections: Not on file   Education Assessment and Provision:  Detailed education and instructions provided on heart failure disease management including the following:  Signs and symptoms of Heart Failure When to call the physician Importance of daily weights Low sodium diet Fluid restriction Medication management Anticipated future follow-up appointments  Patient education given on each of the above topics.  Patient acknowledges understanding via teach back method and acceptance of all instructions.  Education Materials:  "Living Better With Heart Failure" Booklet, HF zone tool, & Daily Weight Tracker Tool.  Patient has scale at home: No, brought one to bedside Patient has  pill box at home: No, brought one to bedside.    High Risk Criteria for Readmission and/or Poor Patient Outcomes: Heart failure hospital admissions (last 6 months): 0  No Show rate: 22% Difficult social situation: no Demonstrates medication adherence: No, skipping doses to make them last longer, No refills or insurance.  Primary Language: Spanish, speaks English very well.  Literacy level: Reading, writing, and comprehension  Barriers of Care:   No Insurance- medication costs Diet/  fluids restrictions - eats from work a lot ( Mark)  Daily weights - gave scale  New HF education   Considerations/Referrals:   Referral made to Heart Failure Pharmacist Stewardship: yes Referral made to Heart Failure CSW/NCM TOC: yes, Insurance None - med costs Referral made to Heart & Vascular TOC clinic: Yes, 09/05/2022 @ 3 pm.   Items for Follow-up on DC/TOC: No Insurance - medication costs-gave a pill box Diet/ fluid restrictions ( salt/ soda) eats a lot from work ( Mark Hayden)  Daily [employer] - gave a scale  Continued HF education   Rhae Hammock, Scientist, research (physical sciences), Energy manager Chat Only

## 2022-08-30 ENCOUNTER — Ambulatory Visit: Payer: Medicaid Other | Admitting: Student

## 2022-09-01 ENCOUNTER — Inpatient Hospital Stay (HOSPITAL_BASED_OUTPATIENT_CLINIC_OR_DEPARTMENT_OTHER)
Admission: EM | Admit: 2022-09-01 | Discharge: 2022-09-03 | DRG: 193 | Disposition: A | Discharge status: Home / Self Care | Payer: Commercial Managed Care - HMO | Attending: Internal Medicine | Admitting: Internal Medicine

## 2022-09-01 ENCOUNTER — Encounter (HOSPITAL_BASED_OUTPATIENT_CLINIC_OR_DEPARTMENT_OTHER): Payer: Self-pay | Admitting: Emergency Medicine

## 2022-09-01 ENCOUNTER — Emergency Department (HOSPITAL_BASED_OUTPATIENT_CLINIC_OR_DEPARTMENT_OTHER): Payer: Commercial Managed Care - HMO

## 2022-09-01 ENCOUNTER — Other Ambulatory Visit: Payer: Self-pay

## 2022-09-01 DIAGNOSIS — Z91199 Patient's noncompliance with other medical treatment and regimen due to unspecified reason: Secondary | ICD-10-CM | POA: Diagnosis not present

## 2022-09-01 DIAGNOSIS — I161 Hypertensive emergency: Secondary | ICD-10-CM | POA: Diagnosis present

## 2022-09-01 DIAGNOSIS — N184 Chronic kidney disease, stage 4 (severe): Secondary | ICD-10-CM | POA: Diagnosis not present

## 2022-09-01 DIAGNOSIS — D509 Iron deficiency anemia, unspecified: Secondary | ICD-10-CM | POA: Diagnosis not present

## 2022-09-01 DIAGNOSIS — I5033 Acute on chronic diastolic (congestive) heart failure: Secondary | ICD-10-CM | POA: Diagnosis present

## 2022-09-01 DIAGNOSIS — Z87891 Personal history of nicotine dependence: Secondary | ICD-10-CM | POA: Diagnosis not present

## 2022-09-01 DIAGNOSIS — J440 Chronic obstructive pulmonary disease with acute lower respiratory infection: Secondary | ICD-10-CM | POA: Diagnosis present

## 2022-09-01 DIAGNOSIS — Z79899 Other long term (current) drug therapy: Secondary | ICD-10-CM | POA: Diagnosis not present

## 2022-09-01 DIAGNOSIS — R0602 Shortness of breath: Secondary | ICD-10-CM | POA: Diagnosis not present

## 2022-09-01 DIAGNOSIS — I509 Heart failure, unspecified: Secondary | ICD-10-CM

## 2022-09-01 DIAGNOSIS — I5043 Acute on chronic combined systolic (congestive) and diastolic (congestive) heart failure: Secondary | ICD-10-CM | POA: Diagnosis not present

## 2022-09-01 DIAGNOSIS — I130 Hypertensive heart and chronic kidney disease with heart failure and stage 1 through stage 4 chronic kidney disease, or unspecified chronic kidney disease: Secondary | ICD-10-CM | POA: Diagnosis present

## 2022-09-01 DIAGNOSIS — Z5986 Financial insecurity: Secondary | ICD-10-CM

## 2022-09-01 DIAGNOSIS — Z7689 Persons encountering health services in other specified circumstances: Secondary | ICD-10-CM | POA: Diagnosis not present

## 2022-09-01 DIAGNOSIS — J189 Pneumonia, unspecified organism: Secondary | ICD-10-CM | POA: Diagnosis not present

## 2022-09-01 DIAGNOSIS — Z1152 Encounter for screening for COVID-19: Secondary | ICD-10-CM | POA: Diagnosis not present

## 2022-09-01 DIAGNOSIS — Z91013 Allergy to seafood: Secondary | ICD-10-CM | POA: Diagnosis not present

## 2022-09-01 DIAGNOSIS — Z23 Encounter for immunization: Secondary | ICD-10-CM

## 2022-09-01 DIAGNOSIS — E785 Hyperlipidemia, unspecified: Secondary | ICD-10-CM | POA: Diagnosis present

## 2022-09-01 HISTORY — DX: Heart failure, unspecified: I50.9

## 2022-09-01 LAB — CBC
HCT: 33.1 % — ABNORMAL LOW (ref 39.0–52.0)
Hemoglobin: 11 g/dL — ABNORMAL LOW (ref 13.0–17.0)
MCH: 30.8 pg (ref 26.0–34.0)
MCHC: 33.2 g/dL (ref 30.0–36.0)
MCV: 92.7 fL (ref 80.0–100.0)
Platelets: 251 10*3/uL (ref 150–400)
RBC: 3.57 MIL/uL — ABNORMAL LOW (ref 4.22–5.81)
RDW: 13.2 % (ref 11.5–15.5)
WBC: 6.6 10*3/uL (ref 4.0–10.5)
nRBC: 0 % (ref 0.0–0.2)

## 2022-09-01 LAB — RAPID URINE DRUG SCREEN, HOSP PERFORMED
Amphetamines: NOT DETECTED
Barbiturates: NOT DETECTED
Benzodiazepines: NOT DETECTED
Cocaine: NOT DETECTED
Opiates: NOT DETECTED
Tetrahydrocannabinol: POSITIVE — AB

## 2022-09-01 LAB — BASIC METABOLIC PANEL
Anion gap: 9 (ref 5–15)
BUN: 34 mg/dL — ABNORMAL HIGH (ref 6–20)
CO2: 19 mmol/L — ABNORMAL LOW (ref 22–32)
Calcium: 7.6 mg/dL — ABNORMAL LOW (ref 8.9–10.3)
Chloride: 111 mmol/L (ref 98–111)
Creatinine, Ser: 3.43 mg/dL — ABNORMAL HIGH (ref 0.61–1.24)
GFR, Estimated: 22 mL/min — ABNORMAL LOW (ref >60)
Glucose, Bld: 104 mg/dL — ABNORMAL HIGH (ref 70–99)
Potassium: 3.9 mmol/L (ref 3.5–5.1)
Sodium: 139 mmol/L (ref 135–145)

## 2022-09-01 LAB — PROCALCITONIN: Procalcitonin: <0.1 ng/mL

## 2022-09-01 LAB — SARS CORONAVIRUS 2 BY RT PCR: SARS Coronavirus 2 by RT PCR: NEGATIVE

## 2022-09-01 LAB — LACTIC ACID, PLASMA: Lactic Acid, Venous: 0.5 mmol/L (ref 0.5–1.9)

## 2022-09-01 LAB — TROPONIN I (HIGH SENSITIVITY)
Troponin I (High Sensitivity): 80 ng/L — ABNORMAL HIGH (ref <18)
Troponin I (High Sensitivity): 72 ng/L — ABNORMAL HIGH (ref <18)

## 2022-09-01 LAB — BRAIN NATRIURETIC PEPTIDE: B Natriuretic Peptide: 277.3 pg/mL — ABNORMAL HIGH (ref 0.0–100.0)

## 2022-09-01 MED ORDER — ORAL CARE MOUTH RINSE: 15.0000 mL | OROMUCOSAL | Status: DC | PRN

## 2022-09-01 MED ORDER — DOXYCYCLINE HYCLATE 100 MG PO TABS: 100.0000 mg | ORAL_TABLET | Freq: Two times a day (BID) | ORAL | Status: DC

## 2022-09-01 MED ORDER — HYDRALAZINE HCL 20 MG/ML IJ SOLN
10.0000 mg | Freq: Once | INTRAMUSCULAR | Status: AC
  Administered 2022-09-01: 10 mg via INTRAVENOUS
  Filled 2022-09-01: qty 1

## 2022-09-01 MED ORDER — SODIUM BICARBONATE 650 MG PO TABS
650.0000 mg | ORAL_TABLET | Freq: Two times a day (BID) | ORAL | Status: DC
  Administered 2022-09-01 – 2022-09-03 (×5): 650 mg via ORAL
  Filled 2022-09-01 (×5): qty 1

## 2022-09-01 MED ORDER — HYDRALAZINE HCL 20 MG/ML IJ SOLN
5.0000 mg | Freq: Four times a day (QID) | INTRAMUSCULAR | Status: DC | PRN
  Administered 2022-09-03: 5 mg via INTRAVENOUS
  Filled 2022-09-01: qty 1

## 2022-09-01 MED ORDER — ONDANSETRON HCL 4 MG/2ML IJ SOLN
4.0000 mg | Freq: Once | INTRAMUSCULAR | Status: AC
  Administered 2022-09-01: 4 mg via INTRAVENOUS
  Filled 2022-09-01: qty 2

## 2022-09-01 MED ORDER — CARVEDILOL 6.25 MG PO TABS: 6.2500 mg | ORAL_TABLET | Freq: Two times a day (BID) | ORAL | Status: DC

## 2022-09-01 MED ORDER — ACETAMINOPHEN 325 MG PO TABS
650.0000 mg | ORAL_TABLET | ORAL | Status: DC | PRN
  Administered 2022-09-01 – 2022-09-03 (×2): 650 mg via ORAL
  Filled 2022-09-01 (×2): qty 2

## 2022-09-01 MED ORDER — ISOSORBIDE MONONITRATE ER 60 MG PO TB24
60.0000 mg | ORAL_TABLET | Freq: Every day | ORAL | Status: DC
  Administered 2022-09-01 – 2022-09-02 (×2): 60 mg via ORAL
  Filled 2022-09-01 (×2): qty 1

## 2022-09-01 MED ORDER — SPIRONOLACTONE 25 MG PO TABS: 25.0000 mg | ORAL_TABLET | Freq: Every day | ORAL | Status: DC

## 2022-09-01 MED ORDER — SODIUM CHLORIDE 0.9% FLUSH
3.0000 mL | Freq: Two times a day (BID) | INTRAVENOUS | Status: DC
  Administered 2022-09-01 – 2022-09-02 (×4): 3 mL via INTRAVENOUS

## 2022-09-01 MED ORDER — SODIUM CHLORIDE 0.9 % IV SOLN: 250.0000 mL | INTRAVENOUS | Status: DC | PRN

## 2022-09-01 MED ORDER — SODIUM CHLORIDE 0.9% FLUSH: 3.0000 mL | INTRAVENOUS | Status: DC | PRN

## 2022-09-01 MED ORDER — DOXYCYCLINE HYCLATE 100 MG PO TABS
100.0000 mg | ORAL_TABLET | Freq: Two times a day (BID) | ORAL | Status: DC
  Administered 2022-09-01 – 2022-09-03 (×5): 100 mg via ORAL
  Filled 2022-09-01 (×5): qty 1

## 2022-09-01 MED ORDER — HEPARIN SODIUM (PORCINE) 5000 UNIT/ML IJ SOLN
5000.0000 [IU] | Freq: Two times a day (BID) | INTRAMUSCULAR | Status: DC
  Administered 2022-09-01 – 2022-09-02 (×4): 5000 [IU] via SUBCUTANEOUS
  Filled 2022-09-01 (×4): qty 1

## 2022-09-01 MED ORDER — CARVEDILOL 6.25 MG PO TABS
6.2500 mg | ORAL_TABLET | Freq: Two times a day (BID) | ORAL | Status: DC
  Administered 2022-09-01: 6.25 mg via ORAL
  Filled 2022-09-01: qty 1

## 2022-09-01 MED ORDER — HYDRALAZINE HCL 50 MG PO TABS
75.0000 mg | ORAL_TABLET | Freq: Three times a day (TID) | ORAL | Status: DC
  Administered 2022-09-01 – 2022-09-02 (×3): 75 mg via ORAL
  Filled 2022-09-01 (×3): qty 1

## 2022-09-01 MED ORDER — FERROUS SULFATE 325 (65 FE) MG PO TABS
325.0000 mg | ORAL_TABLET | Freq: Every day | ORAL | Status: DC
  Administered 2022-09-02 – 2022-09-03 (×2): 325 mg via ORAL
  Filled 2022-09-01 (×2): qty 1

## 2022-09-01 MED ORDER — ONDANSETRON HCL 4 MG/2ML IJ SOLN: 4.0000 mg | Freq: Four times a day (QID) | INTRAMUSCULAR | Status: DC | PRN

## 2022-09-01 MED ORDER — HYDRALAZINE HCL 25 MG PO TABS
50.0000 mg | ORAL_TABLET | Freq: Once | ORAL | Status: AC
  Administered 2022-09-01: 50 mg via ORAL
  Filled 2022-09-01: qty 2

## 2022-09-01 MED ORDER — SODIUM CHLORIDE 0.9 % IV SOLN
500.0000 mg | INTRAVENOUS | Status: DC
  Administered 2022-09-01: 500 mg via INTRAVENOUS
  Filled 2022-09-01: qty 5

## 2022-09-01 MED ORDER — FUROSEMIDE 10 MG/ML IJ SOLN
40.0000 mg | Freq: Two times a day (BID) | INTRAMUSCULAR | Status: DC
  Administered 2022-09-01 – 2022-09-03 (×4): 40 mg via INTRAVENOUS
  Filled 2022-09-01 (×4): qty 4

## 2022-09-01 MED ORDER — METOPROLOL SUCCINATE ER 50 MG PO TB24: 50.0000 mg | ORAL_TABLET | Freq: Every day | ORAL | Status: DC

## 2022-09-01 MED ORDER — SODIUM CHLORIDE 0.9 % IV SOLN
1.0000 g | INTRAVENOUS | Status: DC
  Administered 2022-09-01: 1 g via INTRAVENOUS
  Filled 2022-09-01: qty 10

## 2022-09-01 NOTE — H&P (Signed)
History and Physical    Mark Hayden ZOX:[id number] DOB: 21-Sep-1975 DOA: 09/01/2022  PCP: Patient, No Pcp Per (Confirm with patient/family/NH records and if not entered, this has to be entered at [facility] point of entry) Patient coming from: Home  I have personally briefly reviewed patient's old medical records in [facility]  Chief Complaint: Cough, SOB  HPI: Mark Hayden is a 47 y.o. 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  Patient was recently hospitalized for HTN emergency and decompensated combined HFpEF and HFrEF, echocardiogram showed LVEF 45-50% with global hypokinesis, patient underwent aggressive diuresis and his BP medication regimen adjusted and he was discharged endralazine/Imdur regimen and metoprolol 2 weeks ago.  Patient reported he has been compliant with all his BP and diabetes medications however gradually has been seen weight gaining of 3 pounds over last week, he does not check his blood pressure at home.  Last few days he has been having severe exertional dyspnea as well as orthopnea.  Last visit also has 3 times of pressure-like chest pains in the middle night and resolved by its own, each episode lasted less than 1 hour.  This morning, patient started to have a productive cough with whitish phlegm denies any fever or chills.  ED Course: Blood pressure significantly elevated 214/112, chest x-ray LLL pneumonia and pulmonary congestion, troponins 80> 72, BMP bicarb 19, BUN 34 creatinine 3.4, sodium 139, potassium 3.9.  WBC 6.6, hemoglobin 11, patient was given ceftriaxone and azithromycin  Review of Systems: As per HPI otherwise 14 point review of systems negative.    Past Medical History:  Diagnosis Date   CHF (congestive heart failure) (HCC)    Hypertension     History reviewed. No pertinent  surgical history.   reports that he has quit smoking. He has never used smokeless tobacco. He reports that he does not drink alcohol and does not use drugs.  Allergies  Allergen Reactions   Shellfish Allergy Anaphylaxis and Itching    seafood    Family History  Problem Relation Age of Onset   Diabetes Mother    Diabetes Father      Prior to Admission medications   Medication Sig Start Date End Date Taking? Authorizing Provider  furosemide (LASIX) 40 MG tablet Take 1 tablet (40 mg total) by mouth daily. 08/20/22 09/19/22  Arrien, York Ram, MD  hydrALAZINE (APRESOLINE) 50 MG tablet Take 1.5 tablets (75 mg total) by mouth every 8 (eight) hours. 08/20/22 09/19/22  Arrien, York Ram, MD  isosorbide mononitrate (IMDUR) 60 MG 24 hr tablet Take 1 tablet (60 mg total) by mouth daily. 08/21/22 09/20/22  Arrien, York Ram, MD  metoprolol succinate (TOPROL-XL) 50 MG 24 hr tablet Take 1 tablet (50 mg total) by mouth daily. Take with or immediately following a meal. 08/21/22 09/20/22  Arrien, York Ram, MD    Physical Exam: Vitals:   09/01/22 1115 09/01/22 1130 09/01/22 1145 09/01/22 1246  BP: (!) 181/94 (!) 186/110 (!) 194/99 (!) 197/125  Pulse: 84  87 91  Resp: 18  20 17   Temp:      SpO2: 100%  100% 98%  Weight:      Height:        Constitutional: NAD, calm, comfortable Vitals:   09/01/22 1115 09/01/22 1130 09/01/22 1145 09/01/22 1246  BP: (!) 181/94 (!) 186/110 (!) 194/99 Marland Kitchen)  197/125  Pulse: 84  87 91  Resp: 18  20 17   Temp:      SpO2: 100%  100% 98%  Weight:      Height:       Eyes: PERRL, lids and conjunctivae normal ENMT: Mucous membranes are moist. Posterior pharynx clear of any exudate or lesions.Normal dentition.  Neck: normal, supple, no masses, no thyromegaly Respiratory: clear to auscultation bilaterally, no wheezing, fine crackles on bilateral lower fields left> right.  Increasing respiratory effort. No accessory muscle use.  Cardiovascular: Regular rate  and rhythm, no murmurs / rubs / gallops. No extremity edema. 2+ pedal pulses. No carotid bruits.  Abdomen: no tenderness, no masses palpated. No hepatosplenomegaly. Bowel sounds positive.  Musculoskeletal: no clubbing / cyanosis. No joint deformity upper and lower extremities. Good ROM, no contractures. Normal muscle tone.  Skin: no rashes, lesions, ulcers. No induration Neurologic: CN 2-12 grossly intact. Sensation intact, DTR normal. Strength 5/5 in all 4.  Psychiatric: Normal judgment and insight. Alert and oriented x 3. Normal mood.     Labs on Admission: I have personally reviewed following labs and imaging studies  CBC: Recent Labs  Lab 09/01/22 0542  WBC 6.6  HGB 11.0*  HCT 33.1*  MCV 92.7  PLT 251   Basic Metabolic Panel: Recent Labs  Lab 09/01/22 0542  NA 139  K 3.9  CL 111  CO2 19*  GLUCOSE 104*  BUN 34*  CREATININE 3.43*  CALCIUM 7.6*   GFR: Estimated Creatinine Clearance: 31.8 mL/min (A) (by C-G formula based on SCr of 3.43 mg/dL (H)). Liver Function Tests: No results for input(s): "AST", "ALT", "ALKPHOS", "BILITOT", "PROT", "ALBUMIN" in the last 168 hours. No results for input(s): "LIPASE", "AMYLASE" in the last 168 hours. No results for input(s): "AMMONIA" in the last 168 hours. Coagulation Profile: No results for input(s): "INR", "PROTIME" in the last 168 hours. Cardiac Enzymes: No results for input(s): "CKTOTAL", "CKMB", "CKMBINDEX", "TROPONINI" in the last 168 hours. BNP (last 3 results) No results for input(s): "PROBNP" in the last 8760 hours. HbA1C: No results for input(s): "HGBA1C" in the last 72 hours. CBG: No results for input(s): "GLUCAP" in the last 168 hours. Lipid Profile: No results for input(s): "CHOL", "HDL", "LDLCALC", "TRIG", "CHOLHDL", "LDLDIRECT" in the last 72 hours. Thyroid Function Tests: No results for input(s): "TSH", "T4TOTAL", "FREET4", "T3FREE", "THYROIDAB" in the last 72 hours. Anemia Panel: No results for input(s):  "VITAMINB12", "FOLATE", "FERRITIN", "TIBC", "IRON", "RETICCTPCT" in the last 72 hours. Urine analysis:    Component Value Date/Time   COLORURINE STRAW (A) 08/17/2022 1644   APPEARANCEUR CLEAR 08/17/2022 1644   LABSPEC 1.009 08/17/2022 1644   PHURINE 6.0 08/17/2022 1644   GLUCOSEU NEGATIVE 08/17/2022 1644   HGBUR SMALL (A) 08/17/2022 1644   BILIRUBINUR NEGATIVE 08/17/2022 1644   KETONESUR NEGATIVE 08/17/2022 1644   PROTEINUR NEGATIVE 08/17/2022 1644   NITRITE NEGATIVE 08/17/2022 1644   LEUKOCYTESUR NEGATIVE 08/17/2022 1644    Radiological Exams on Admission: DG Chest Port 1 View  Result Date: 09/01/2022 CLINICAL DATA:  102026 with shortness of breath x1 day with cough. Unable to sleep. EXAM: PORTABLE CHEST 1 VIEW COMPARISON:  Portable chest 08/17/2022 FINDINGS: Stable mild cardiomegaly, with interval resolution of the prior CHF findings. Small-to-moderate left pleural effusion is noted and there is patchy airspace disease in the overlying left base. Findings most likely indicate pneumonia and a parapneumonic effusion. The remaining lungs are clear. The mediastinum is normally outlined. The right sulci are sharp. No acute osseous  abnormality is seen. In all other respects no further changes. IMPRESSION: 1. Left lower lobe probable pneumonia, with small to moderate pleural effusion. 2. Stable mild cardiomegaly. 3. Interval resolution of the prior CHF findings. 4. Clinical correlation and radiographic follow-up recommended. Electronically Signed   By: Almira Bar M.D.   On: 09/01/2022 06:20    EKG: Independently reviewed.  Sinus, LVH, nonspecific ST tenderness  Assessment/Plan Principal Problem:   LLL pneumonia Active Problems:   Acute on chronic diastolic CHF (congestive heart failure) (HCC)   Hypertensive emergency   CKD (chronic kidney disease) stage 4, GFR 15-29 ml/min (HCC)   CHF (congestive heart failure) (HCC)  (please populate well all problems here in Problem List. (For  example, if patient is on BP meds at home and you resume or decide to hold them, it is a problem that needs to be her. Same for CAD, COPD, HLD and so on)  Acute on chronic HFrEF and HFpEF decompensation -Likely secondary to HTN emergency.  Appears that on last admission there was a question raised regarding whether patient has been compliant with home BP/CHF medications, when I talk to the patient, he was brought to tell the name and frequency of medication including metoprolol hydralazine and Imdur.  On last admission, nephrology recommended spironolactone however pharmacy recommended given the patient's worsening of kidney function, did not start Aldactone.  Will change metoprolol back to Coreg 6.25 mg and adjust, continue hydralazine 75 mg 3 times daily and Imdur 60 mg daily -Lasix 40 mg IV twice daily  LLL PNA -Symptoms mixed with CHF decompensation, clinically hard to distinguish, but given there is no significant systemic inflammatory signs such as leukocytosis or fever, will de-escalate antibiotics to doxycycline.  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  Chest pains, chronic Troponin elevation, flat pattern and no significant ST-T changes on EKG -Probably demanding ischemia secondary to HTN) and HF decompensation -Recommend outpatient CAD workup  Chronic iron deficiency anemia -Start iron supplement, denies any overt bleeding.   -Outpatient follow-up with GI  DVT prophylaxis: Heparin subcu Code Status: Full code Family Communication: None at bedside Disposition Plan: Expect less than 2 midnight hospital stay Consults called: None Admission status: PCU obs   Emeline General MD [facility] Pager [phone]  09/01/2022, 1:23 PM

## 2022-09-01 NOTE — ED Provider Notes (Signed)
[place] EMERGENCY DEPARTMENT AT [facility] Provider Note   CSN: [id number] Arrival date & time: 09/01/22  6578     History  Chief Complaint  Patient presents with   Shortness of Breath    Mark Hayden is a 47 y.o. male.  47 year old male presents with shortness of breath x 2 days.  Recent admission for CHF.  Slight cough but no fever.  Does note increased dyspnea exertion.  Denies any leg pain or swelling.  Denies any chest pain or chest pressure.  No recent history of blood loss.  No vomiting or diarrhea.  States compliance with his blood pressure medication.  No treatment for this use prior to arrival       Home Medications Prior to Admission medications   Medication Sig Start Date End Date Taking? Authorizing Provider  furosemide (LASIX) 40 MG tablet Take 1 tablet (40 mg total) by mouth daily. 08/20/22 09/19/22  Arrien, York Ram, MD  hydrALAZINE (APRESOLINE) 50 MG tablet Take 1.5 tablets (75 mg total) by mouth every 8 (eight) hours. 08/20/22 09/19/22  Arrien, York Ram, MD  isosorbide mononitrate (IMDUR) 60 MG 24 hr tablet Take 1 tablet (60 mg total) by mouth daily. 08/21/22 09/20/22  Arrien, York Ram, MD  metoprolol succinate (TOPROL-XL) 50 MG 24 hr tablet Take 1 tablet (50 mg total) by mouth daily. Take with or immediately following a meal. 08/21/22 09/20/22  Arrien, York Ram, MD      Allergies    Shellfish allergy    Review of Systems   Review of Systems  All other systems reviewed and are negative.   Physical Exam Updated Vital Signs BP (!) 214/122   Pulse 74   Temp 98.5 F (36.9 C)   Resp (!) 25   Ht 1.803 m (5\' 11" )   Wt 93.7 kg   SpO2 96%   BMI 28.81 kg/m  Physical Exam Vitals and nursing note reviewed.  Constitutional:      General: He is not in acute distress.    Appearance: Normal appearance. He is well-developed. He is not toxic-appearing.  HENT:     Head: Normocephalic and atraumatic.  Eyes:     General: Lids  are normal.     Conjunctiva/sclera: Conjunctivae normal.     Pupils: Pupils are equal, round, and reactive to light.  Neck:     Thyroid: No thyroid mass.     Trachea: No tracheal deviation.  Cardiovascular:     Rate and Rhythm: Normal rate and regular rhythm.     Heart sounds: Normal heart sounds. No murmur heard.    No gallop.  Pulmonary:     Effort: Pulmonary effort is normal. Tachypnea present. No respiratory distress.     Breath sounds: No stridor. Examination of the left-lower field reveals decreased breath sounds. Decreased breath sounds present. No wheezing, rhonchi or rales.  Abdominal:     General: There is no distension.     Palpations: Abdomen is soft.     Tenderness: There is no abdominal tenderness. There is no rebound.  Musculoskeletal:        General: No tenderness. Normal range of motion.     Cervical back: Normal range of motion and neck supple.  Skin:    General: Skin is warm and dry.     Findings: No abrasion or rash.  Neurological:     Mental Status: He is alert and oriented to person, place, and time. Mental status is at baseline.     GCS:  GCS eye subscore is 4. GCS verbal subscore is 5. GCS motor subscore is 6.     Cranial Nerves: No cranial nerve deficit.     Sensory: No sensory deficit.     Motor: Motor function is intact.  Psychiatric:        Attention and Perception: Attention normal.        Speech: Speech normal.        Behavior: Behavior normal.     ED Results / Procedures / Treatments   Labs (all labs ordered are listed, but only abnormal results are displayed) Labs Reviewed  BASIC METABOLIC PANEL - Abnormal; Notable for the following components:      Result Value   CO2 19 (*)    Glucose, Bld 104 (*)    BUN 34 (*)    Creatinine, Ser 3.43 (*)    Calcium 7.6 (*)    GFR, Estimated 22 (*)    All other components within normal limits  CBC - Abnormal; Notable for the following components:   RBC 3.57 (*)    Hemoglobin 11.0 (*)    HCT 33.1 (*)     All other components within normal limits  BRAIN NATRIURETIC PEPTIDE - Abnormal; Notable for the following components:   B Natriuretic Peptide 277.3 (*)    All other components within normal limits  TROPONIN I (HIGH SENSITIVITY) - Abnormal; Notable for the following components:   Troponin I (High Sensitivity) 80 (*)    All other components within normal limits  CULTURE, BLOOD (ROUTINE X 2)  CULTURE, BLOOD (ROUTINE X 2)  LACTIC ACID, PLASMA  LACTIC ACID, PLASMA  TROPONIN I (HIGH SENSITIVITY)    EKG EKG Interpretation  Date/Time:  Saturday September 01 2022 05:37:30 EDT Ventricular Rate:  80 PR Interval:  149 QRS Duration: 104 QT Interval:  391 QTC Calculation: 451 R Axis:   82 Text Interpretation: Sinus rhythm Probable left atrial enlargement LVH with secondary repolarization abnormality Anterior ST elevation, probably due to LVH similar to prior twi to lead 3 appears to be new Confirmed by Tanda Rockers ([id number]) on 09/01/2022 5:44:35 AM  Radiology DG Chest Port 1 View  Result Date: 09/01/2022 CLINICAL DATA:  [id number] with shortness of breath x1 day with cough. Unable to sleep. EXAM: PORTABLE CHEST 1 VIEW COMPARISON:  Portable chest 08/17/2022 FINDINGS: Stable mild cardiomegaly, with interval resolution of the prior CHF findings. Small-to-moderate left pleural effusion is noted and there is patchy airspace disease in the overlying left base. Findings most likely indicate pneumonia and a parapneumonic effusion. The remaining lungs are clear. The mediastinum is normally outlined. The right sulci are sharp. No acute osseous abnormality is seen. In all other respects no further changes. IMPRESSION: 1. Left lower lobe probable pneumonia, with small to moderate pleural effusion. 2. Stable mild cardiomegaly. 3. Interval resolution of the prior CHF findings. 4. Clinical correlation and radiographic follow-up recommended. Electronically Signed   By: Almira Bar M.D.   On: 09/01/2022 06:20     Procedures Procedures    Medications Ordered in ED Medications  azithromycin (ZITHROMAX) 500 mg in sodium chloride 0.9 % 250 mL IVPB (has no administration in time range)  cefTRIAXone (ROCEPHIN) 1 g in sodium chloride 0.9 % 100 mL IVPB (has no administration in time range)  hydrALAZINE (APRESOLINE) injection 10 mg (has no administration in time range)  hydrALAZINE (APRESOLINE) tablet 50 mg (50 mg Oral Given 09/01/22 0705)    ED Course/ Medical Decision Making/ A&P  Medical Decision Making Amount and/or Complexity of Data Reviewed Labs: ordered.  Risk Prescription drug management.   Patient's EKG per my interpretation shows normal sinus rhythm.  No signs of acute ischemic changes.  Chest x-ray per my interpretation shows left lower lobe pneumonia.  Patient started on IV antibiotics.  Patient also fairly hypertensive here.  Given hydralazine 10 mg IV push.  Patient will require admission.  Will consult hospitalist team  CRITICAL CARE Performed by: Toy Baker Total critical care time: 45 minutes Critical care time was exclusive of separately billable procedures and treating other patients. Critical care was necessary to treat or prevent imminent or life-threatening deterioration. Critical care was time spent personally by me on the following activities: development of treatment plan with patient and/or surrogate as well as nursing, discussions with consultants, evaluation of patient's response to treatment, examination of patient, obtaining history from patient or surrogate, ordering and performing treatments and interventions, ordering and review of laboratory studies, ordering and review of radiographic studies, pulse oximetry and re-evaluation of patient's condition.         Final Clinical Impression(s) / ED Diagnoses Final diagnoses:  None    Rx / DC Orders ED Discharge Orders     None         Lorre Nick, MD 09/01/22  [phone]

## 2022-09-01 NOTE — ED Notes (Signed)
Patient reports trouble breathing, feeling like he has fluid build up in his lungs. Spo2 is stable on room air. Patient able to speak in full sentences however does seem dyspneic at rest. Dr. Freida Busman made aware.

## 2022-09-01 NOTE — ED Triage Notes (Signed)
Pt c/o increased SHOB x 1 day with cough. States that he has been unable to sleep. Pt was recently admitted on 5/31.

## 2022-09-01 NOTE — Progress Notes (Signed)
Plan of Care Note for accepted transfer   Patient: Mark Hayden MRN: [id number]   DOA: 09/01/2022  Facility requesting transfer: [facility] Requesting Provider: Freida Busman Reason for transfer: [facility]CAP  Facility course: Patient with h/o poorly controlled HTN (noncompliance due to financial issues), stage 4 CKD, and THC dependence who was recently admitted from 5/31-6/3 for acute on chronic diastolic CHF with HTN emergency.  He presented with SOB x 2 days.  CXR with LLL PNA.  BP 214/122 - will need progressive care (and BP management prior to transfer).  Plan of care: The patient is accepted for admission to Progressive unit, at [facility].   Author: Jonah Blue, MD 09/01/2022  Check [email or web address] for on-call coverage.  Nursing staff, Please call [facility] Admits & Consults System-Wide number on Amion as soon as patient's arrival, so appropriate admitting provider can evaluate the pt.

## 2022-09-01 NOTE — ED Notes (Signed)
ED TO INPATIENT HANDOFF REPORT  ED Nurse Name and Phone #: Victorino Dike [phone]  S Name/Age/Gender Mark Hayden 47 y.o. male Room/Bed: DB005/DB005  Code Status   Code Status: Prior  Home/SNF/Other Home Patient oriented to: self, place, time, and situation Is this baseline? Yes   Triage Complete: Triage complete  Chief Complaint LLL pneumonia [J18.9]  Triage Note Pt c/o increased SHOB x 1 day with cough. States that he has been unable to sleep. Pt was recently admitted on 5/31.   Allergies Allergies  Allergen Reactions   Shellfish Allergy Anaphylaxis and Itching    seafood    Level of Care/Admitting Diagnosis ED Disposition     ED Disposition  Admit   Condition  --   Comment  Hospital Area: [facility] [[id number]]  Level of Care: Progressive [102]  Admit to Progressive based on following criteria: RESPIRATORY PROBLEMS hypoxemic/hypercapnic respiratory failure that is responsive to NIPPV (BiPAP) or High Flow Nasal Cannula (6-80 lpm). Frequent assessment/intervention, no > Q2 hrs < Q4 hrs, to maintain oxygenation and pulmonary hygiene.  May admit patient to [facility] or [facility] if equivalent level of care is available:: Yes  Interfacility transfer: Yes  Covid Evaluation: Recent COVID positive no isolation required infection day 21-90  Diagnosis: LLL pneumonia [308657]  Admitting Physician: Jonah Blue [[id number]]  Attending Physician: Jonah Blue [[id number]]  Certification:: I certify this patient will need inpatient services for at least 2 midnights  Estimated Length of Stay: 3          B Medical/Surgery History Past Medical History:  Diagnosis Date   CHF (congestive heart failure) (HCC)    Hypertension    History reviewed. No pertinent surgical history.   A IV Location/Drains/Wounds Patient Lines/Drains/Airways Status     Active Line/Drains/Airways     Name Placement date Placement time Site Days   Peripheral IV  09/01/22 20 G Left;Posterior Hand 09/01/22  0540  Hand  less than 1            Intake/Output Last 24 hours No intake or output data in the 24 hours ending 09/01/22 1011  Labs/Imaging Results for orders placed or performed during the hospital encounter of 09/01/22 (from the past 48 hour(s))  Brain natriuretic peptide     Status: Abnormal   Collection Time: 09/01/22  5:40 AM  Result Value Ref Range   B Natriuretic Peptide 277.3 (H) 0.0 - 100.0 pg/mL    Comment: Performed at [facility], [address] [place], [place] [address]  Basic metabolic panel     Status: Abnormal   Collection Time: 09/01/22  5:42 AM  Result Value Ref Range   Sodium 139 135 - 145 mmol/L   Potassium 3.9 3.5 - 5.1 mmol/L   Chloride 111 98 - 111 mmol/L   CO2 19 (L) 22 - 32 mmol/L   Glucose, Bld 104 (H) 70 - 99 mg/dL    Comment: Glucose reference range applies only to samples taken after fasting for at least 8 hours.   BUN 34 (H) 6 - 20 mg/dL   Creatinine, Ser 2.95 (H) 0.61 - 1.24 mg/dL   Calcium 7.6 (L) 8.9 - 10.3 mg/dL   GFR, Estimated 22 (L) >60 mL/min    Comment: (NOTE) Calculated using the CKD-EPI Creatinine Equation (2021)    Anion gap 9 5 - 15    Comment: Performed at [facility], [address], [place], [place] [address]  CBC     Status: Abnormal  Collection Time: 09/01/22  5:42 AM  Result Value Ref Range   WBC 6.6 4.0 - 10.5 K/uL   RBC 3.57 (L) 4.22 - 5.81 MIL/uL   Hemoglobin 11.0 (L) 13.0 - 17.0 g/dL   HCT 81.1 (L) 91.4 - 78.2 %   MCV 92.7 80.0 - 100.0 fL   MCH 30.8 26.0 - 34.0 pg   MCHC 33.2 30.0 - 36.0 g/dL   RDW 95.6 21.3 - 08.6 %   Platelets 251 150 - 400 K/uL   nRBC 0.0 0.0 - 0.2 %    Comment: Performed at [facility], [address] [place], [place] [address]  Troponin I (High Sensitivity)     Status: Abnormal   Collection Time: 09/01/22  5:42 AM  Result Value Ref Range   Troponin I (High Sensitivity) 80 (H) <18  ng/L    Comment: (NOTE) Elevated high sensitivity troponin I (hsTnI) values and significant  changes across serial measurements may suggest ACS but many other  chronic and acute conditions are known to elevate hsTnI results.  Refer to the "Links" section for chest pain algorithms and additional  guidance. Performed at [facility], [address], [place], [place] [address]   Lactic acid, plasma     Status: None   Collection Time: 09/01/22  8:06 AM  Result Value Ref Range   Lactic Acid, Venous 0.5 0.5 - 1.9 mmol/L    Comment: Performed at [facility], [address] [place], [place] [address]  Troponin I (High Sensitivity)     Status: Abnormal   Collection Time: 09/01/22  8:58 AM  Result Value Ref Range   Troponin I (High Sensitivity) 72 (H) <18 ng/L    Comment: (NOTE) Elevated high sensitivity troponin I (hsTnI) values and significant  changes across serial measurements may suggest ACS but many other  chronic and acute conditions are known to elevate hsTnI results.  Refer to the "Links" section for chest pain algorithms and additional  guidance. Performed at [facility], [address] [place], [place] [address]   SARS Coronavirus 2 by RT PCR (hospital order, performed in [facility] hospital lab) *cepheid single result test* Anterior Nasal Swab     Status: None   Collection Time: 09/01/22  8:58 AM   Specimen: Anterior Nasal Swab  Result Value Ref Range   SARS Coronavirus 2 by RT PCR NEGATIVE NEGATIVE    Comment: (NOTE) SARS-CoV-2 target nucleic acids are NOT DETECTED.  The SARS-CoV-2 RNA is generally detectable in upper and lower respiratory specimens during the acute phase of infection. The lowest concentration of SARS-CoV-2 viral copies this assay can detect is 250 copies / mL. A negative result does not preclude SARS-CoV-2 infection and should not be used as the sole basis for treatment or other patient  management decisions.  A negative result may occur with improper specimen collection / handling, submission of specimen other than nasopharyngeal swab, presence of viral mutation(s) within the areas targeted by this assay, and inadequate number of viral copies (<250 copies / mL). A negative result must be combined with clinical observations, patient history, and epidemiological information.  Fact Sheet for Patients:   RoadLapTop.co.za  Fact Sheet for Healthcare Providers: http://kim-miller.com/  This test is not yet approved or  cleared by the [place] FDA and has been authorized for detection and/or diagnosis of SARS-CoV-2 by FDA under an Emergency Use Authorization (EUA).  This EUA will remain in effect (meaning this test can be used) for the  duration of the COVID-19 declaration under Section 564(b)(1) of the Act, 21 U.S.C. section 360bbb-3(b)(1), unless the authorization is terminated or revoked sooner.  Performed at [facility], [address], [place], [place] [address]    DG Chest [facility] 1 View  Result Date: 09/01/2022 CLINICAL DATA:  [id number] with shortness of breath x1 day with cough. Unable to sleep. EXAM: PORTABLE CHEST 1 VIEW COMPARISON:  Portable chest 08/17/2022 FINDINGS: Stable mild cardiomegaly, with interval resolution of the prior CHF findings. Small-to-moderate left pleural effusion is noted and there is patchy airspace disease in the overlying left base. Findings most likely indicate pneumonia and a parapneumonic effusion. The remaining lungs are clear. The mediastinum is normally outlined. The right sulci are sharp. No acute osseous abnormality is seen. In all other respects no further changes. IMPRESSION: 1. Left lower lobe probable pneumonia, with small to moderate pleural effusion. 2. Stable mild cardiomegaly. 3. Interval resolution of the prior CHF findings. 4. Clinical correlation and radiographic  follow-up recommended. Electronically Signed   By: Almira Bar M.D.   On: 09/01/2022 06:20    Pending Labs Unresulted Labs (From admission, onward)     Start     Ordered   09/01/22 0712  Culture, blood (Routine X 2) w Reflex to ID Panel  BLOOD CULTURE X 2,   R (with STAT occurrences)     Question:  Patient immune status  Answer:  Normal   09/01/22 0711            Vitals/Pain Today's Vitals   09/01/22 0815 09/01/22 0830 09/01/22 0845 09/01/22 0900  BP: (!) 196/126 (!) 194/122 (!) 182/110 (!) 176/95  Pulse: 84 91 94 93  Resp: (!) 24 14 (!) 22   Temp:      SpO2: 98% 99% 98% 100%  Weight:      Height:      PainSc:        Isolation Precautions Airborne and Contact precautions  Medications Medications  azithromycin (ZITHROMAX) 500 mg in sodium chloride 0.9 % 250 mL IVPB (0 mg Intravenous Stopped 09/01/22 0855)  cefTRIAXone (ROCEPHIN) 1 g in sodium chloride 0.9 % 100 mL IVPB (0 g Intravenous Stopped 09/01/22 0855)  hydrALAZINE (APRESOLINE) tablet 50 mg (50 mg Oral Given 09/01/22 0705)  hydrALAZINE (APRESOLINE) injection 10 mg (10 mg Intravenous Given 09/01/22 0814)  ondansetron (ZOFRAN) injection 4 mg (4 mg Intravenous Given 09/01/22 0901)    Mobility walks     Focused Assessments Pulmonary Assessment Handoff:  Lung sounds: Bilateral Breath Sounds: Diminished L Breath Sounds: Diminished R Breath Sounds: Diminished O2 Device: Room Air      R Recommendations: See Admitting Provider Note  Report given to:   Additional Notes:  '

## 2022-09-01 NOTE — ED Notes (Signed)
Dr. Wallace Cullens aware of patient's BP remaining elevated. Awaiting orders for home medications to help address BP.

## 2022-09-02 DIAGNOSIS — J189 Pneumonia, unspecified organism: Secondary | ICD-10-CM

## 2022-09-02 DIAGNOSIS — I5033 Acute on chronic diastolic (congestive) heart failure: Secondary | ICD-10-CM

## 2022-09-02 DIAGNOSIS — I161 Hypertensive emergency: Secondary | ICD-10-CM | POA: Diagnosis not present

## 2022-09-02 DIAGNOSIS — Z87891 Personal history of nicotine dependence: Secondary | ICD-10-CM | POA: Diagnosis not present

## 2022-09-02 DIAGNOSIS — Z91199 Patient's noncompliance with other medical treatment and regimen due to unspecified reason: Secondary | ICD-10-CM | POA: Diagnosis not present

## 2022-09-02 DIAGNOSIS — Z79899 Other long term (current) drug therapy: Secondary | ICD-10-CM | POA: Diagnosis not present

## 2022-09-02 DIAGNOSIS — Z1152 Encounter for screening for COVID-19: Secondary | ICD-10-CM | POA: Diagnosis not present

## 2022-09-02 DIAGNOSIS — Z23 Encounter for immunization: Secondary | ICD-10-CM | POA: Diagnosis not present

## 2022-09-02 DIAGNOSIS — E785 Hyperlipidemia, unspecified: Secondary | ICD-10-CM | POA: Diagnosis not present

## 2022-09-02 DIAGNOSIS — N184 Chronic kidney disease, stage 4 (severe): Secondary | ICD-10-CM | POA: Diagnosis not present

## 2022-09-02 DIAGNOSIS — I5043 Acute on chronic combined systolic (congestive) and diastolic (congestive) heart failure: Secondary | ICD-10-CM | POA: Diagnosis not present

## 2022-09-02 DIAGNOSIS — J440 Chronic obstructive pulmonary disease with acute lower respiratory infection: Secondary | ICD-10-CM | POA: Diagnosis not present

## 2022-09-02 DIAGNOSIS — Z5986 Financial insecurity: Secondary | ICD-10-CM | POA: Diagnosis not present

## 2022-09-02 DIAGNOSIS — D509 Iron deficiency anemia, unspecified: Secondary | ICD-10-CM | POA: Diagnosis not present

## 2022-09-02 DIAGNOSIS — Z91013 Allergy to seafood: Secondary | ICD-10-CM | POA: Diagnosis not present

## 2022-09-02 DIAGNOSIS — I130 Hypertensive heart and chronic kidney disease with heart failure and stage 1 through stage 4 chronic kidney disease, or unspecified chronic kidney disease: Secondary | ICD-10-CM | POA: Diagnosis not present

## 2022-09-02 LAB — BASIC METABOLIC PANEL
Anion gap: 10 (ref 5–15)
BUN: 31 mg/dL — ABNORMAL HIGH (ref 6–20)
CO2: 19 mmol/L — ABNORMAL LOW (ref 22–32)
Calcium: 7.4 mg/dL — ABNORMAL LOW (ref 8.9–10.3)
Chloride: 107 mmol/L (ref 98–111)
Creatinine, Ser: 3.54 mg/dL — ABNORMAL HIGH (ref 0.61–1.24)
GFR, Estimated: 21 mL/min — ABNORMAL LOW (ref >60)
Glucose, Bld: 92 mg/dL (ref 70–99)
Potassium: 3.6 mmol/L (ref 3.5–5.1)
Sodium: 136 mmol/L (ref 135–145)

## 2022-09-02 LAB — PROCALCITONIN: Procalcitonin: <0.1 ng/mL

## 2022-09-02 MED ORDER — CARVEDILOL 12.5 MG PO TABS: 12.5000 mg | ORAL_TABLET | Freq: Two times a day (BID) | ORAL | Status: DC

## 2022-09-02 MED ORDER — HYDRALAZINE HCL 50 MG PO TABS
100.0000 mg | ORAL_TABLET | Freq: Three times a day (TID) | ORAL | Status: DC
  Administered 2022-09-02 – 2022-09-03 (×3): 100 mg via ORAL
  Filled 2022-09-02 (×3): qty 2

## 2022-09-02 MED ORDER — METOPROLOL SUCCINATE ER 50 MG PO TB24
75.0000 mg | ORAL_TABLET | Freq: Every day | ORAL | Status: DC
  Administered 2022-09-02: 75 mg via ORAL
  Filled 2022-09-02: qty 1

## 2022-09-02 NOTE — Plan of Care (Signed)
  Problem: Education: Goal: Knowledge of General Education information will improve Description Including pain rating scale, medication(s)/side effects and non-pharmacologic comfort measures Outcome: Progressing   Problem: Health Behavior/Discharge Planning: Goal: Ability to manage health-related needs will improve Outcome: Progressing   

## 2022-09-02 NOTE — Progress Notes (Signed)
PROGRESS NOTE    Cristion Ribelin  MWN:[id number] DOB: 1975/07/10 DOA: 09/01/2022 PCP: Patient, No Pcp Per   Chief Complaint  Patient presents with   Shortness of Breath    Brief Narrative:    Jusitn Fei is a 47 y.o. 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   Patient was recently hospitalized for HTN emergency and decompensated combined HFpEF and HFrEF, echocardiogram showed LVEF 45-50% with global hypokinesis, patient underwent aggressive diuresis and his BP medication regimen adjusted and he was discharged endralazine/Imdur regimen and metoprolol 2 weeks ago.  Patient reported he has been compliant with all his BP and diabetes medications however gradually has been seen weight gaining of 3 pounds over last week, he does not check his blood pressure at home.  Last few days he has been having severe exertional dyspnea as well as orthopnea.  Last visit also has 3 times of pressure-like chest pains in the middle night and resolved by its own, each episode lasted less than 1 hour.  This morning, patient started to have a productive cough with whitish phlegm denies any fever or chills.   ED Course: Blood pressure significantly elevated 214/112, chest x-ray LLL pneumonia and pulmonary congestion, troponins 80> 72, BMP bicarb 19, BUN 34 creatinine 3.4, sodium 139, potassium 3.9.  WBC 6.6, hemoglobin 11, patient was given ceftriaxone and azithromycin    Assessment & Plan:   Principal Problem:   LLL pneumonia Active Problems:   Acute on chronic diastolic CHF (congestive heart failure) (HCC)   Hypertensive emergency   CKD (chronic kidney disease) stage 4, GFR 15-29 ml/min (HCC)   CHF (congestive heart failure) (HCC)    Acute on chronic diastolic and systolic CHF  -Presents with dyspnea, evidence of volume overload on  imaging, elevated BNP, and 5 pound weight gain. -Appears to be in decompensated CHF, this is most likely in the setting of hypertensive emergency and compliance with salt restriction. -Continue with IV Lasix 40 mg IV twice daily and with optimal blood pressure control, please see discussion below/ -[place] for hold on Aldactone, ACE and ARB given significant kidney insufficiency -Continue with daily weight -Patient has appointment with advanced CHF clinic on 6/19, he was instructed to keep   LLL PNA -Reports cough, opacity present on imaging, findings are more likely related to CHF than pneumonia, but will finish 5 days of doxycycline.   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   Chest pains, chronic Troponin elevation, flat pattern and no significant ST-T changes on EKG -Probably demanding ischemia secondary to HTN) and HF decompensation -Recommend outpatient CAD workup   Chronic iron deficiency anemia -Start iron supplement, denies any overt bleeding.   -Outpatient follow-up with GI  DVT prophylaxis: [place] HEPARIN Code Status: Full Family Communication: none at bedside Disposition: Still need to stay in the hospital as will need further adjustment of his hypertensive medication, and still in further need of IV diuresis.    Consultants:  none  Subjective:  Reports dyspnea, but he denies chest pain today  Objective: Vitals:   09/02/22 0004 09/02/22 0357 09/02/22 0730 09/02/22 0930  BP: (!) 180/96 (!) 175/99  (!) 164/92  Pulse: 82 84  83  Resp: 18 (!) 24  19  Temp: 98.4 F (36.9 C) 98.4 F (36.9 C)  98.4 F (36.9 C)  TempSrc: Oral Oral   Oral  SpO2: 96% 97%    Weight:   94 kg   Height:        Intake/Output Summary (Last 24 hours) at 09/02/2022 1206 Last data filed at 09/02/2022 1016 Gross per 24 hour  Intake 273.83 ml  Output 2150 ml  Net -1876.17 ml   Filed Weights   09/01/22 0532 09/02/22 0730  Weight: 93.7 kg 94 kg    Examination:  Awake Alert, Oriented X 3, No new F.N deficits, Normal affect Symmetrical Chest wall movement, fine crackles at lung bases RRR,No Gallops,Rubs or new Murmurs, No Parasternal Heave +ve B.Sounds, Abd Soft, No tenderness, No rebound - guarding or rigidity. No Cyanosis, Clubbing or edema, No new Rash or bruise      Data Reviewed: I have personally reviewed following labs and imaging studies  CBC: Recent Labs  Lab 09/01/22 0542  WBC 6.6  HGB 11.0*  HCT 33.1*  MCV 92.7  PLT 251    Basic Metabolic Panel: Recent Labs  Lab 09/01/22 0542 09/02/22 0327  NA 139 136  K 3.9 3.6  CL 111 107  CO2 19* 19*  GLUCOSE 104* 92  BUN 34* 31*  CREATININE 3.43* 3.54*  CALCIUM 7.6* 7.4*    GFR: Estimated Creatinine Clearance: 30.9 mL/min (A) (by C-G formula based on SCr of 3.54 mg/dL (H)).  Liver Function Tests: No results for input(s): "AST", "ALT", "ALKPHOS", "BILITOT", "PROT", "ALBUMIN" in the last 168 hours.  CBG: No results for input(s): "GLUCAP" in the last 168 hours.   Recent Results (from the past 240 hour(s))  Culture, blood (Routine X 2) w Reflex to ID Panel     Status: None (Preliminary result)   Collection Time: 09/01/22  8:06 AM   Specimen: BLOOD  Result Value Ref Range Status   Specimen Description   Final    BLOOD BLOOD RIGHT HAND Performed at [facility], [address] [place], [place] [address]    Special Requests   Final    BOTTLES DRAWN AEROBIC AND ANAEROBIC Blood Culture adequate volume Performed at [facility], [address], [place], [place] [address]    Culture   Final    NO GROWTH < 24  HOURS Performed at [facility], [address], [place], [place] [address]    Report Status PENDING  Incomplete  Culture, blood (Routine X 2) w Reflex to ID Panel     Status: None (Preliminary result)   Collection Time: 09/01/22  8:06 AM   Specimen: BLOOD  Result Value Ref Range Status   Specimen Description   Final    BLOOD BLOOD RIGHT ARM Performed at [facility], [address], [place], [place] [address]    Special Requests   Final    BOTTLES DRAWN AEROBIC AND ANAEROBIC Blood Culture adequate volume Performed at [facility], [address] [place], [place] [address]    Culture   Final    NO GROWTH < 24 HOURS Performed at [facility], [address], [place], [place] [address]    Report Status PENDING  Incomplete  SARS Coronavirus 2 by RT PCR (hospital order, performed in [facility]  hospital lab) *cepheid single result test* Anterior Nasal Swab     Status: None   Collection Time: 09/01/22  8:58 AM   Specimen: Anterior Nasal Swab  Result Value Ref Range Status   SARS Coronavirus 2 by RT PCR NEGATIVE NEGATIVE Final    Comment: (NOTE) SARS-CoV-2 target nucleic acids are NOT DETECTED.  The SARS-CoV-2 RNA is generally detectable in upper and lower respiratory specimens during the acute phase of infection. The lowest concentration of SARS-CoV-2 viral copies this assay can detect is 250 copies / mL. A negative result does not preclude SARS-CoV-2 infection and should not be used as the sole basis for treatment or other patient management decisions.  A negative result may occur with improper specimen collection / handling, submission of specimen other than nasopharyngeal swab, presence of viral mutation(s) within the areas targeted by this assay, and inadequate number of viral copies (<250 copies / mL). A negative result must be combined with clinical observations, patient history, and epidemiological information.  Fact  Sheet for Patients:   RoadLapTop.co.za  Fact Sheet for Healthcare Providers: http://kim-miller.com/  This test is not yet approved or  cleared by the [place] FDA and has been authorized for detection and/or diagnosis of SARS-CoV-2 by FDA under an Emergency Use Authorization (EUA).  This EUA will remain in effect (meaning this test can be used) for the duration of the COVID-19 declaration under Section 564(b)(1) of the Act, 21 U.S.C. section 360bbb-3(b)(1), unless the authorization is terminated or revoked sooner.  Performed at [facility], [address], [place], [place] [address]          Radiology Studies: DG Chest [facility] 1 View  Result Date: 09/01/2022 CLINICAL DATA:  [id number] with shortness of breath x1 day with cough. Unable to sleep. EXAM: PORTABLE CHEST 1 VIEW COMPARISON:  Portable chest 08/17/2022 FINDINGS: Stable mild cardiomegaly, with interval resolution of the prior CHF findings. Small-to-moderate left pleural effusion is noted and there is patchy airspace disease in the overlying left base. Findings most likely indicate pneumonia and a parapneumonic effusion. The remaining lungs are clear. The mediastinum is normally outlined. The right sulci are sharp. No acute osseous abnormality is seen. In all other respects no further changes. IMPRESSION: 1. Left lower lobe probable pneumonia, with small to moderate pleural effusion. 2. Stable mild cardiomegaly. 3. Interval resolution of the prior CHF findings. 4. Clinical correlation and radiographic follow-up recommended. Electronically Signed   By: Almira Bar M.D.   On: 09/01/2022 06:20        Scheduled Meds:  carvedilol  12.5 mg Oral BID WC   doxycycline  100 mg Oral Q12H   ferrous sulfate  325 mg Oral Q breakfast   furosemide  40 mg Intravenous Q12H   heparin  5,000 Units Subcutaneous Q12H   hydrALAZINE  100 mg Oral Q8H   isosorbide mononitrate  60 mg  Oral Daily   sodium bicarbonate  650 mg Oral BID   sodium chloride flush  3 mL Intravenous Q12H   Continuous Infusions:  sodium chloride       LOS: 1 day       Huey Bienenstock, MD [facility]   To contact the attending provider between 7A-7P or the covering provider during after hours 7P-7A, please log into the web site [email or web address] and access using universal [place] password for that web site. If you do not have the password, please call the hospital operator.  09/02/2022, 12:06 PM

## 2022-09-02 NOTE — Plan of Care (Signed)
  Problem: Education: Goal: Knowledge of General Education information will improve Description: Including pain rating scale, medication(s)/side effects and non-pharmacologic comfort measures 09/02/2022 0218 by Charmian Muff, RN Outcome: Progressing 09/02/2022 0217 by Charmian Muff, RN Outcome: Progressing   Problem: Health Behavior/Discharge Planning: Goal: Ability to manage health-related needs will improve 09/02/2022 0218 by Charmian Muff, RN Outcome: Progressing 09/02/2022 0217 by Charmian Muff, RN Outcome: Progressing

## 2022-09-03 ENCOUNTER — Other Ambulatory Visit (HOSPITAL_COMMUNITY): Payer: Self-pay

## 2022-09-03 DIAGNOSIS — I5033 Acute on chronic diastolic (congestive) heart failure: Secondary | ICD-10-CM | POA: Diagnosis not present

## 2022-09-03 DIAGNOSIS — I161 Hypertensive emergency: Secondary | ICD-10-CM | POA: Diagnosis not present

## 2022-09-03 DIAGNOSIS — N184 Chronic kidney disease, stage 4 (severe): Secondary | ICD-10-CM | POA: Diagnosis not present

## 2022-09-03 LAB — BASIC METABOLIC PANEL
Anion gap: 11 (ref 5–15)
BUN: 33 mg/dL — ABNORMAL HIGH (ref 6–20)
CO2: 20 mmol/L — ABNORMAL LOW (ref 22–32)
Calcium: 7.7 mg/dL — ABNORMAL LOW (ref 8.9–10.3)
Chloride: 105 mmol/L (ref 98–111)
Creatinine, Ser: 3.59 mg/dL — ABNORMAL HIGH (ref 0.61–1.24)
GFR, Estimated: 20 mL/min — ABNORMAL LOW (ref >60)
Glucose, Bld: 91 mg/dL (ref 70–99)
Potassium: 3.6 mmol/L (ref 3.5–5.1)
Sodium: 136 mmol/L (ref 135–145)

## 2022-09-03 LAB — CBC
HCT: 35.5 % — ABNORMAL LOW (ref 39.0–52.0)
Hemoglobin: 11.7 g/dL — ABNORMAL LOW (ref 13.0–17.0)
MCH: 30.5 pg (ref 26.0–34.0)
MCHC: 33 g/dL (ref 30.0–36.0)
MCV: 92.4 fL (ref 80.0–100.0)
Platelets: 243 10*3/uL (ref 150–400)
RBC: 3.84 MIL/uL — ABNORMAL LOW (ref 4.22–5.81)
RDW: 13 % (ref 11.5–15.5)
WBC: 6.3 10*3/uL (ref 4.0–10.5)
nRBC: 0 % (ref 0.0–0.2)

## 2022-09-03 LAB — MYCOPLASMA PNEUMONIAE ANTIBODY, IGM: Mycoplasma pneumo IgM: <770 U/mL (ref 0–769)

## 2022-09-03 LAB — CULTURE, BLOOD (ROUTINE X 2): Culture: NO GROWTH

## 2022-09-03 MED ORDER — ISOSORBIDE MONONITRATE ER 60 MG PO TB24
90.0000 mg | ORAL_TABLET | Freq: Every day | ORAL | 1 refills | Status: DC
  Filled 2022-09-03: qty 45, 30d supply, fill #0

## 2022-09-03 MED ORDER — PNEUMOCOCCAL 20-VAL CONJ VACC 0.5 ML IM SUSY: 0.5000 mL | PREFILLED_SYRINGE | INTRAMUSCULAR | Status: DC

## 2022-09-03 MED ORDER — METOPROLOL SUCCINATE ER 100 MG PO TB24
100.0000 mg | ORAL_TABLET | Freq: Every day | ORAL | Status: DC
  Administered 2022-09-03: 100 mg via ORAL
  Filled 2022-09-03: qty 1

## 2022-09-03 MED ORDER — PNEUMOCOCCAL 20-VAL CONJ VACC 0.5 ML IM SUSY
0.5000 mL | PREFILLED_SYRINGE | Freq: Once | INTRAMUSCULAR | Status: AC
  Administered 2022-09-03: 0.5 mL via INTRAMUSCULAR
  Filled 2022-09-03: qty 0.5

## 2022-09-03 MED ORDER — METOPROLOL SUCCINATE ER 100 MG PO TB24
100.0000 mg | ORAL_TABLET | Freq: Every day | ORAL | 1 refills | Status: DC
  Filled 2022-09-03: qty 30, 30d supply, fill #0

## 2022-09-03 MED ORDER — SODIUM BICARBONATE 650 MG PO TABS
650.0000 mg | ORAL_TABLET | Freq: Two times a day (BID) | ORAL | 1 refills | Status: DC
  Filled 2022-09-03: qty 60, 30d supply, fill #0

## 2022-09-03 MED ORDER — HYDRALAZINE HCL 100 MG PO TABS
100.0000 mg | ORAL_TABLET | Freq: Three times a day (TID) | ORAL | 1 refills | Status: DC
  Filled 2022-09-03: qty 90, 15d supply, fill #0
  Filled 2022-09-03: qty 90, 30d supply, fill #0

## 2022-09-03 MED ORDER — FERROUS SULFATE 325 (65 FE) MG PO TABS
325.0000 mg | ORAL_TABLET | Freq: Every day | ORAL | 1 refills | Status: DC
  Filled 2022-09-03: qty 30, 30d supply, fill #0

## 2022-09-03 MED ORDER — ISOSORBIDE MONONITRATE ER 60 MG PO TB24
90.0000 mg | ORAL_TABLET | Freq: Every day | ORAL | Status: DC
  Administered 2022-09-03: 90 mg via ORAL
  Filled 2022-09-03: qty 1

## 2022-09-03 MED ORDER — FUROSEMIDE 40 MG PO TABS
40.0000 mg | ORAL_TABLET | Freq: Every day | ORAL | 1 refills | Status: DC
  Filled 2022-09-03: qty 30, 30d supply, fill #0

## 2022-09-03 NOTE — TOC Transition Note (Signed)
Transition of Care [facility]) - CM/SW Discharge Note   Patient Details  Name: Mark Hayden MRN: [id number] Date of Birth: May 13, 1975  [facility] CM/SW Contact:  Gordy Clement, RN Phone Number: 09/03/2022, 9:38 AM   Clinical Narrative:     Patient to dc to home. No PT or OT needs. Will follow up with PCP as directed on AVS.  MATCH letter was done on 6/2 for the year so he will not be eligible.  Looks like he now has Medicaid.           Patient Goals and CMS Choice      Discharge Placement                         Discharge Plan and Services Additional resources added to the After Visit Summary for                                       Social Determinants of Health (SDOH) Interventions SDOH Screenings   Food Insecurity: No Food Insecurity (08/20/2022)  Recent Concern: Food Insecurity - Food Insecurity Present (08/17/2022)  Housing: Low Risk  (08/20/2022)  Recent Concern: Housing - High Risk (08/17/2022)  Transportation Needs: No Transportation Needs (08/20/2022)  Recent Concern: Transportation Needs - Unmet Transportation Needs (08/17/2022)  Utilities: Not At Risk (08/20/2022)  Recent Concern: Utilities - At Risk (08/17/2022)  Alcohol Screen: Low Risk  (08/20/2022)  Financial Resource Strain: High Risk (08/20/2022)  Tobacco Use: Medium Risk (09/01/2022)     Readmission Risk Interventions     No data to display

## 2022-09-03 NOTE — Progress Notes (Signed)
Heart Failure Navigator Progress Note  Assessed for Heart & Vascular TOC clinic readiness.  Patient has a HF TOC appointment scheduled for 09/05/2022 from pervious admission. .   Navigator available for reassessment of patient.   Rhae Hammock, BSN, Scientist, clinical (histocompatibility and immunogenetics) Only

## 2022-09-03 NOTE — Progress Notes (Signed)
Pt was given hydralazine 5mg  IVP for BP 176/102.

## 2022-09-03 NOTE — Discharge Summary (Signed)
PATIENT DETAILS Name: Mark Hayden Age: 47 y.o. Sex: male Date of Birth: 1975-12-14 MRN: [id number]. Admitting Physician: Emeline General, MD KGM:[id number], No Pcp Per  Admit Date: 09/01/2022 Discharge date: 09/03/2022  Recommendations for Outpatient Follow-up:  Follow up with PCP in 1-2 weeks Please obtain CMP/CBC in one week Please follow up CHF clinic (appointment at 6/19) and PCP (appointment at 6/24)  Admitted From:  Home  Disposition: Home   Discharge Condition: good  CODE STATUS:   Code Status: Full Code   Diet recommendation:  Diet Order             Diet - low sodium heart healthy           Diet Heart Room service appropriate? Yes; Fluid consistency: Thin; Fluid restriction: 1800 mL Fluid  Diet effective now                    Brief Summary: 47 year old with longstanding history of poorly controlled hypertension, CKD 4, chronic combined HFpEF and HFrEF-presented with shortness of breath-found to have CHF exacerbation and subsequently admitted to the hospitalist service.  Brief Hospital Course: Acute on chronic combined HFrEF/HFpEF Improved with IV Lasix Now euvolemic on exam  Changed to oral diuretics Follow-up with CHF clinic on 6/19  Uncontrolled hypertension Chronic issue-has had uncontrolled hypertension for years ?  Compliance  BP medications adjusted-continue current dosing of Imdur/hydralazine/metoprolol and furosemide.  ?  PNA No clinical features Possibly that CXR changes were likely related to fluid overload/CHF issues Do not think patient requires any antibiotics-monitor clinically-has follow-up with CHF/PCP clinic in the next several days.  CKD 4 Creatinine close to baseline Ensure follow-up with nephrology Extensive discussion-he needs optimal BP control in order to prevent further kidney damage.  BMI: Estimated body mass index is 28.9 kg/m as calculated from the following:   Height as of this encounter: 5\' 11"  (1.803  m).   Weight as of this encounter: 94 kg.    Discharge Diagnoses:  Principal Problem:   LLL pneumonia Active Problems:   Acute on chronic diastolic CHF (congestive heart failure) (HCC)   Hypertensive emergency   CKD (chronic kidney disease) stage 4, GFR 15-29 ml/min (HCC)   CHF (congestive heart failure) (HCC)   Discharge Instructions:  Activity:  As tolerated  Discharge Instructions     (HEART FAILURE PATIENTS) Call MD:  Anytime you have any of the following symptoms: 1) 3 pound weight gain in 24 hours or 5 pounds in 1 week 2) shortness of breath, with or without a dry hacking cough 3) swelling in the hands, feet or stomach 4) if you have to sleep on extra pillows at night in order to breathe.   Complete by: As directed    Call MD for:  difficulty breathing, headache or visual disturbances   Complete by: As directed    Call MD for:  extreme fatigue   Complete by: As directed    Call MD for:  persistant nausea and vomiting   Complete by: As directed    Diet - low sodium heart healthy   Complete by: As directed    Discharge instructions   Complete by: As directed    Follow with Primary MD on 6/24 and with the advanced CHF clinic on 6/19.  Please be compliant and take all your medications as prescribed  Please get a complete blood count and chemistry panel checked by your Primary MD at your next visit, and again as instructed by your Primary  MD.  Get Medicines reviewed and adjusted: Please take all your medications with you for your next visit with your Primary MD  Laboratory/radiological data: Please request your Primary MD to go over all hospital tests and procedure/radiological results at the follow up, please ask your Primary MD to get all Hospital records sent to his/her office.  In some cases, they will be blood work, cultures and biopsy results pending at the time of your discharge. Please request that your primary care M.D. follows up on these results.  Also Note  the following: If you experience worsening of your admission symptoms, develop shortness of breath, life threatening emergency, suicidal or homicidal thoughts you must seek medical attention immediately by calling 911 or calling your MD immediately  if symptoms less severe.  You must read complete instructions/literature along with all the possible adverse reactions/side effects for all the Medicines you take and that have been prescribed to you. Take any new Medicines after you have completely understood and accpet all the possible adverse reactions/side effects.   Do not drive when taking Pain medications or sleeping medications (Benzodaizepines)  Do not take more than prescribed Pain, Sleep and Anxiety Medications. It is not advisable to combine anxiety,sleep and pain medications without talking with your primary care practitioner  Special Instructions: If you have smoked or chewed Tobacco  in the last 2 yrs please stop smoking, stop any regular Alcohol  and or any Recreational drug use.  Wear Seat belts while driving.  Please note: You were cared for by a hospitalist during your hospital stay. Once you are discharged, your primary care physician will handle any further medical issues. Please note that NO REFILLS for any discharge medications will be authorized once you are discharged, as it is imperative that you return to your primary care physician (or establish a relationship with a primary care physician if you do not have one) for your post hospital discharge needs so that they can reassess your need for medications and monitor your lab values.   Increase activity slowly   Complete by: As directed       Allergies as of 09/03/2022       Reactions   Shellfish Allergy Anaphylaxis, Itching   seafood        Medication List     TAKE these medications    ferrous sulfate 325 (65 FE) MG tablet Take 1 tablet (325 mg total) by mouth daily with breakfast.   furosemide 40 MG  tablet Commonly known as: LASIX Tome 1 tableta (40 mg en total) por va oral diariamente. (Take 1 tablet (40 mg total) by mouth daily.)   hydrALAZINE 50 MG tablet Commonly known as: APRESOLINE Take 2 tablets (100 mg total) by mouth 3 (three) times daily. What changed:  how much to take when to take this   isosorbide mononitrate 60 MG 24 hr tablet Commonly known as: IMDUR Take 1.5 tablets (90 mg total) by mouth daily. What changed: how much to take   metoprolol succinate 100 MG 24 hr tablet Commonly known as: TOPROL-XL Take 1 tablet (100 mg total) by mouth daily. Take with or immediately following a meal. What changed:  medication strength how much to take   sodium bicarbonate 650 MG tablet Take 1 tablet (650 mg total) by mouth 2 (two) times daily.        Follow-up Information     [facility] Follow up on 09/05/2022.   Specialty: Cardiology Why: appointment  at 3 pm Contact information: [address] [id number] FA [id number] [place] [address] [phone]        Steffanie Rainwater, MD Follow up on 09/10/2022.   Specialty: Internal Medicine Why: appt at 3:35 pm Contact information: [address] [place] [address] [phone]                Allergies  Allergen Reactions   Shellfish Allergy Anaphylaxis and Itching    seafood     Other Procedures/Studies: DG Chest Port 1 View  Result Date: 09/01/2022 CLINICAL DATA:  [id number] with shortness of breath x1 day with cough. Unable to sleep. EXAM: PORTABLE CHEST 1 VIEW COMPARISON:  Portable chest 08/17/2022 FINDINGS: Stable mild cardiomegaly, with interval resolution of the prior CHF findings. Small-to-moderate left pleural effusion is noted and there is patchy airspace disease in the overlying left base. Findings most likely indicate pneumonia and a parapneumonic effusion. The remaining lungs are clear. The mediastinum is normally outlined. The right  sulci are sharp. No acute osseous abnormality is seen. In all other respects no further changes. IMPRESSION: 1. Left lower lobe probable pneumonia, with small to moderate pleural effusion. 2. Stable mild cardiomegaly. 3. Interval resolution of the prior CHF findings. 4. Clinical correlation and radiographic follow-up recommended. Electronically Signed   By: Almira Bar M.D.   On: 09/01/2022 06:20   ECHOCARDIOGRAM COMPLETE  Result Date: 08/18/2022    ECHOCARDIOGRAM REPORT   Patient Name:   [facility] Date of Exam: 08/18/2022 Medical Rec #:  [id number]             Height:       71.0 in Accession #:    [id number]            Weight:       201.2 lb Date of Birth:  05-24-1975            BSA:          2.114 m Patient Age:    46 years              BP:           185/110 mmHg Patient Gender: M                     HR:           90 bpm. Exam Location:  Inpatient Procedure: 2D Echo, Cardiac Doppler, Color Doppler and Strain Analysis Indications:    CHF- Acute Diastolic  History:        Patient has no prior history of Echocardiogram examinations.                 CHF, Acute Kidney Disease; Risk Factors:Hypertension and Former                 Smoker.  Sonographer:    Raeford Razor Referring Phys: Sharla Kidney Werner Lean Elveta Rape IMPRESSIONS  1. Left ventricular ejection fraction, by estimation, is 45 to 50%. The left ventricle has mildly decreased function. The left ventricle demonstrates global hypokinesis. There is severe left ventricular hypertrophy. Left ventricular diastolic parameters  are indeterminate. The average left ventricular global longitudinal strain is -15.8 %. The global longitudinal strain is abnormal.  2. Right ventricular systolic function is normal. The right ventricular size is normal.  3. Left atrial size was mildly dilated.  4. The mitral valve is normal in structure. Moderate to severe mitral valve regurgitation. No evidence of mitral stenosis.  5. The aortic  valve is tricuspid. Aortic valve regurgitation  is not visualized. No aortic stenosis is present.  6. Pulmonic valve regurgitation is severe.  7. The inferior vena cava is normal in size with greater than 50% respiratory variability, suggesting right atrial pressure of 3 mmHg. FINDINGS  Left Ventricle: Left ventricular ejection fraction, by estimation, is 45 to 50%. The left ventricle has mildly decreased function. The left ventricle demonstrates global hypokinesis. The average left ventricular global longitudinal strain is -15.8 %. The global longitudinal strain is abnormal. The left ventricular internal cavity size was normal in size. There is severe left ventricular hypertrophy. Left ventricular diastolic parameters are indeterminate. Right Ventricle: The right ventricular size is normal. No increase in right ventricular wall thickness. Right ventricular systolic function is normal. Left Atrium: Left atrial size was mildly dilated. Right Atrium: Right atrial size was normal in size. Pericardium: There is no evidence of pericardial effusion. Mitral Valve: The mitral valve is normal in structure. Moderate to severe mitral valve regurgitation. No evidence of mitral valve stenosis. Tricuspid Valve: The tricuspid valve is normal in structure. Tricuspid valve regurgitation is mild . No evidence of tricuspid stenosis. Aortic Valve: The aortic valve is tricuspid. Aortic valve regurgitation is not visualized. No aortic stenosis is present. Aortic valve mean gradient measures 5.0 mmHg. Aortic valve peak gradient measures 8.4 mmHg. Aortic valve area, by VTI measures 2.98 cm. Pulmonic Valve: The pulmonic valve was normal in structure. Pulmonic valve regurgitation is severe. No evidence of pulmonic stenosis. Aorta: The aortic root is normal in size and structure. Venous: The inferior vena cava is normal in size with greater than 50% respiratory variability, suggesting right atrial pressure of 3 mmHg. IAS/Shunts: No atrial level shunt detected by color flow Doppler.  LEFT  VENTRICLE PLAX 2D LVIDd:         4.40 cm      Diastology LVIDs:         3.40 cm      LV e' medial:    7.94 cm/s LV PW:         1.90 cm      LV E/e' medial:  16.4 LV IVS:        2.00 cm      LV e' lateral:   13.60 cm/s LVOT diam:     2.30 cm      LV E/e' lateral: 9.6 LV SV:         71 LV SV Index:   34           2D Longitudinal Strain LVOT Area:     4.15 cm     2D Strain GLS Avg:     -15.8 %  LV Volumes (MOD) LV vol d, MOD A2C: 140.0 ml LV vol d, MOD A4C: 110.0 ml LV vol s, MOD A2C: 70.4 ml LV vol s, MOD A4C: 63.5 ml LV SV MOD A2C:     69.6 ml LV SV MOD A4C:     110.0 ml LV SV MOD BP:      55.3 ml RIGHT VENTRICLE             IVC RV Basal diam:  2.80 cm     IVC diam: 1.60 cm RV S prime:     13.10 cm/s TAPSE (M-mode): 2.2 cm LEFT ATRIUM             Index        RIGHT ATRIUM           Index LA  diam:        4.40 cm 2.08 cm/m   RA Area:     15.00 cm LA Vol (A2C):   90.1 ml 42.62 ml/m  RA Volume:   37.70 ml  17.83 ml/m LA Vol (A4C):   70.0 ml 33.11 ml/m LA Biplane Vol: 80.0 ml 37.84 ml/m  AORTIC VALVE                    PULMONIC VALVE AV Area (Vmax):    2.89 cm     PV Vmax:       0.92 m/s AV Area (Vmean):   2.81 cm     PV Peak grad:  3.4 mmHg AV Area (VTI):     2.98 cm AV Vmax:           145.00 cm/s AV Vmean:          98.600 cm/s AV VTI:            0.240 m AV Peak Grad:      8.4 mmHg AV Mean Grad:      5.0 mmHg LVOT Vmax:         101.00 cm/s LVOT Vmean:        66.600 cm/s LVOT VTI:          0.172 m LVOT/AV VTI ratio: 0.72  AORTA Ao Root diam: 2.80 cm Ao Asc diam:  3.20 cm MITRAL VALVE MV Area (PHT): 5.62 cm       SHUNTS MV Decel Time: 135 msec       Systemic VTI:  0.17 m MR Peak grad:    112.1 mmHg   Systemic Diam: 2.30 cm MR Mean grad:    83.0 mmHg MR Vmax:         529.50 cm/s MR Vmean:        439.0 cm/s MR PISA:         0.57 cm MR PISA Eff ROA: 4 mm MR PISA Radius:  0.30 cm MV E velocity: 130.00 cm/s MV A velocity: 72.30 cm/s MV E/A ratio:  1.80 Donato Schultz MD Electronically signed by Donato Schultz MD  Signature Date/Time: 08/18/2022/10:51:54 AM    Final    US RENAL  Result Date: 08/17/2022 CLINICAL DATA:  Acute kidney injury EXAM: RENAL / URINARY TRACT ULTRASOUND COMPLETE COMPARISON:  None Available. FINDINGS: Right Kidney: Renal measurements: 8.7 x 5.2 x 5.2 cm = volume: 123.5 mL. Echogenicity within normal limits. No mass or hydronephrosis visualized. Left Kidney: Renal measurements: 9.9 x 4.7 x 4.4 cm = volume: 106.1 mL. Echogenicity within normal limits. No mass or hydronephrosis visualized. Bladder: Appears normal for degree of bladder distention. Bilateral jets visualized. Other: None. IMPRESSION: No hydronephrosis. Electronically Signed   By: Allegra Lai M.D.   On: 08/17/2022 20:20   DG Chest Port 1 View  Result Date: 08/17/2022 CLINICAL DATA:  [id number] with shortness of breath for 2 days, worse when lying flat. Ran out of BP meds 1.5 weeks ago. EXAM: PORTABLE CHEST 1 VIEW COMPARISON:  Portable chest 09/07/2019 FINDINGS: There is mild cardiomegaly with increased vascular engorgement, flow cephalization, and increased central and basilar interstitial edema with small pleural effusions. Perihilar hazy opacities are also noted on the right greater than left and may be due to ground-glass edema. Underlying pneumonia would be difficult to exclude given the asymmetry. The mediastinum is normally outlined. Thoracic cage is intact. Numerous overlying monitor wires are present. IMPRESSION: 1. Cardiomegaly with increased vascular engorgement, interstitial edema and small  pleural effusions consistent with CHF or fluid overload. 2. Perihilar hazy opacities on the right greater than left may be due to ground-glass edema or pneumonia. Electronically Signed   By: Almira Bar M.D.   On: 08/17/2022 06:42     TODAY-DAY OF DISCHARGE:  Subjective:   Mark Hayden today has no headache,no chest abdominal pain,no new weakness tingling or numbness, feels much better wants to go home today.    Objective:   Blood pressure (!) 176/105, pulse 77, temperature 97.6 F (36.4 C), temperature source Oral, resp. rate 15, height 5\' 11"  (1.803 m), weight 94 kg, SpO2 98 %.  Intake/Output Summary (Last 24 hours) at 09/03/2022 0842 Last data filed at 09/03/2022 0546 Gross per 24 hour  Intake 126 ml  Output 1725 ml  Net -1599 ml   Filed Weights   09/01/22 0532 09/02/22 0730 09/03/22 0648  Weight: 93.7 kg 94 kg 94 kg    Exam: Awake Alert, Oriented *3, No new F.N deficits, Normal affect [place].AT,PERRAL Supple Neck,No JVD, No cervical lymphadenopathy appriciated.  Symmetrical Chest wall movement, Good air movement bilaterally, CTAB RRR,No Gallops,Rubs or new Murmurs, No Parasternal Heave +ve B.Sounds, Abd Soft, Non tender, No organomegaly appriciated, No rebound -guarding or rigidity. No Cyanosis, Clubbing or edema, No new Rash or bruise   PERTINENT RADIOLOGIC STUDIES: No results found.   PERTINENT LAB RESULTS: CBC: Recent Labs    09/01/22 0542 09/03/22 0346  WBC 6.6 6.3  HGB 11.0* 11.7*  HCT 33.1* 35.5*  PLT 251 243   CMET CMP     Component Value Date/Time   NA 136 09/03/2022 0346   K 3.6 09/03/2022 0346   CL 105 09/03/2022 0346   CO2 20 (L) 09/03/2022 0346   GLUCOSE 91 09/03/2022 0346   BUN 33 (H) 09/03/2022 0346   CREATININE 3.59 (H) 09/03/2022 0346   CALCIUM 7.7 (L) 09/03/2022 0346   PROT 7.2 08/17/2022 0601   ALBUMIN 3.5 08/18/2022 0052   AST 22 08/17/2022 0601   ALT 19 08/17/2022 0601   ALKPHOS 114 08/17/2022 0601   BILITOT 0.3 08/17/2022 0601   GFRNONAA 20 (L) 09/03/2022 0346    GFR Estimated Creatinine Clearance: 30.4 mL/min (A) (by C-G formula based on SCr of 3.59 mg/dL (H)). No results for input(s): "LIPASE", "AMYLASE" in the last 72 hours. No results for input(s): "CKTOTAL", "CKMB", "CKMBINDEX", "TROPONINI" in the last 72 hours. Invalid input(s): "POCBNP" No results for input(s): "DDIMER" in the last 72 hours. No results for input(s): "HGBA1C"  in the last 72 hours. No results for input(s): "CHOL", "HDL", "LDLCALC", "TRIG", "CHOLHDL", "LDLDIRECT" in the last 72 hours. No results for input(s): "TSH", "T4TOTAL", "T3FREE", "THYROIDAB" in the last 72 hours.  Invalid input(s): "FREET3" No results for input(s): "VITAMINB12", "FOLATE", "FERRITIN", "TIBC", "IRON", "RETICCTPCT" in the last 72 hours. Coags: No results for input(s): "INR" in the last 72 hours.  Invalid input(s): "PT" Microbiology: Recent Results (from the past 240 hour(s))  Culture, blood (Routine X 2) w Reflex to ID Panel     Status: None (Preliminary result)   Collection Time: 09/01/22  8:06 AM   Specimen: BLOOD  Result Value Ref Range Status   Specimen Description   Final    BLOOD BLOOD RIGHT HAND Performed at [facility], [address] [place], [place] [address]    Special Requests   Final    BOTTLES DRAWN AEROBIC AND ANAEROBIC Blood Culture adequate volume Performed at [facility], [address] [place], [place] [address]  Culture   Final    NO GROWTH 2 DAYS Performed at [facility], [address], [place], [place] [address]    Report Status PENDING  Incomplete  Culture, blood (Routine X 2) w Reflex to ID Panel     Status: None (Preliminary result)   Collection Time: 09/01/22  8:06 AM   Specimen: BLOOD  Result Value Ref Range Status   Specimen Description   Final    BLOOD BLOOD RIGHT ARM Performed at [facility], [address] [place], [place] [address]    Special Requests   Final    BOTTLES DRAWN AEROBIC AND ANAEROBIC Blood Culture adequate volume Performed at [facility], [address] [place], [place] [address]    Culture   Final    NO GROWTH 2 DAYS Performed at  [facility], [address], [place], [place] [address]    Report Status PENDING  Incomplete  SARS Coronavirus 2 by RT PCR (hospital order, performed in [facility] hospital lab)  *cepheid single result test* Anterior Nasal Swab     Status: None   Collection Time: 09/01/22  8:58 AM   Specimen: Anterior Nasal Swab  Result Value Ref Range Status   SARS Coronavirus 2 by RT PCR NEGATIVE NEGATIVE Final    Comment: (NOTE) SARS-CoV-2 target nucleic acids are NOT DETECTED.  The SARS-CoV-2 RNA is generally detectable in upper and lower respiratory specimens during the acute phase of infection. The lowest concentration of SARS-CoV-2 viral copies this assay can detect is 250 copies / mL. A negative result does not preclude SARS-CoV-2 infection and should not be used as the sole basis for treatment or other patient management decisions.  A negative result may occur with improper specimen collection / handling, submission of specimen other than nasopharyngeal swab, presence of viral mutation(s) within the areas targeted by this assay, and inadequate number of viral copies (<250 copies / mL). A negative result must be combined with clinical observations, patient history, and epidemiological information.  Fact Sheet for Patients:   RoadLapTop.co.za  Fact Sheet for Healthcare Providers: http://kim-miller.com/  This test is not yet approved or  cleared by the [place] FDA and has been authorized for detection and/or diagnosis of SARS-CoV-2 by FDA under an Emergency Use Authorization (EUA).  This EUA will remain in effect (meaning this test can be used) for the duration of the COVID-19 declaration under Section 564(b)(1) of the Act, 21 U.S.C. section 360bbb-3(b)(1), unless the authorization is terminated or revoked sooner.  Performed at [facility], [address], [place], [place] [address]     FURTHER DISCHARGE INSTRUCTIONS:  Get Medicines reviewed and adjusted: Please take all your medications with you for your next visit with your Primary MD  Laboratory/radiological data: Please request your  Primary MD to go over all hospital tests and procedure/radiological results at the follow up, please ask your Primary MD to get all Hospital records sent to his/her office.  In some cases, they will be blood work, cultures and biopsy results pending at the time of your discharge. Please request that your primary care M.D. goes through all the records of your hospital data and follows up on these results.  Also Note the following: If you experience worsening of your admission symptoms, develop shortness of breath, life threatening emergency, suicidal or homicidal thoughts you must seek medical attention immediately by calling 911 or calling your MD immediately  if symptoms less severe.  You must read complete  instructions/literature along with all the possible adverse reactions/side effects for all the Medicines you take and that have been prescribed to you. Take any new Medicines after you have completely understood and accpet all the possible adverse reactions/side effects.   Do not drive when taking Pain medications or sleeping medications (Benzodaizepines)  Do not take more than prescribed Pain, Sleep and Anxiety Medications. It is not advisable to combine anxiety,sleep and pain medications without talking with your primary care practitioner  Special Instructions: If you have smoked or chewed Tobacco  in the last 2 yrs please stop smoking, stop any regular Alcohol  and or any Recreational drug use.  Wear Seat belts while driving.  Please note: You were cared for by a hospitalist during your hospital stay. Once you are discharged, your primary care physician will handle any further medical issues. Please note that NO REFILLS for any discharge medications will be authorized once you are discharged, as it is imperative that you return to your primary care physician (or establish a relationship with a primary care physician if you do not have one) for your post hospital discharge needs so that they  can reassess your need for medications and monitor your lab values.  Total Time spent coordinating discharge including counseling, education and face to face time equals greater than 30 minutes.  SignedJeoffrey Massed 09/03/2022 8:42 AM

## 2022-09-04 LAB — CULTURE, BLOOD (ROUTINE X 2): Culture: NO GROWTH

## 2022-09-05 ENCOUNTER — Encounter (HOSPITAL_COMMUNITY): Payer: Self-pay

## 2022-09-05 ENCOUNTER — Ambulatory Visit (HOSPITAL_COMMUNITY)
Admit: 2022-09-05 | Discharge: 2022-09-05 | Disposition: A | Discharge status: Home / Self Care | Payer: Commercial Managed Care - HMO | Source: Ambulatory Visit | Attending: Physician Assistant | Admitting: Physician Assistant

## 2022-09-05 VITALS — BP 174/104 | HR 81 | Ht 71.0 in | Wt 203.8 lb

## 2022-09-05 DIAGNOSIS — I10 Essential (primary) hypertension: Secondary | ICD-10-CM | POA: Diagnosis not present

## 2022-09-05 DIAGNOSIS — I081 Rheumatic disorders of both mitral and tricuspid valves: Secondary | ICD-10-CM | POA: Insufficient documentation

## 2022-09-05 DIAGNOSIS — I130 Hypertensive heart and chronic kidney disease with heart failure and stage 1 through stage 4 chronic kidney disease, or unspecified chronic kidney disease: Secondary | ICD-10-CM | POA: Insufficient documentation

## 2022-09-05 DIAGNOSIS — I5022 Chronic systolic (congestive) heart failure: Secondary | ICD-10-CM | POA: Diagnosis not present

## 2022-09-05 DIAGNOSIS — I425 Other restrictive cardiomyopathy: Secondary | ICD-10-CM | POA: Diagnosis not present

## 2022-09-05 DIAGNOSIS — Z8249 Family history of ischemic heart disease and other diseases of the circulatory system: Secondary | ICD-10-CM | POA: Insufficient documentation

## 2022-09-05 DIAGNOSIS — Z91148 Patient's other noncompliance with medication regimen for other reason: Secondary | ICD-10-CM | POA: Insufficient documentation

## 2022-09-05 DIAGNOSIS — N184 Chronic kidney disease, stage 4 (severe): Secondary | ICD-10-CM | POA: Diagnosis not present

## 2022-09-05 DIAGNOSIS — I340 Nonrheumatic mitral (valve) insufficiency: Secondary | ICD-10-CM | POA: Diagnosis not present

## 2022-09-05 DIAGNOSIS — R0683 Snoring: Secondary | ICD-10-CM | POA: Diagnosis not present

## 2022-09-05 DIAGNOSIS — Z79899 Other long term (current) drug therapy: Secondary | ICD-10-CM | POA: Diagnosis not present

## 2022-09-05 LAB — CULTURE, BLOOD (ROUTINE X 2)

## 2022-09-05 MED ORDER — CARVEDILOL 12.5 MG PO TABS: 12.5000 mg | ORAL_TABLET | Freq: Two times a day (BID) | ORAL | 3 refills | Status: DC

## 2022-09-05 MED ORDER — POTASSIUM CHLORIDE ER 10 MEQ PO TBCR: 10.0000 meq | EXTENDED_RELEASE_TABLET | Freq: Every day | ORAL | 3 refills | Status: DC

## 2022-09-05 NOTE — Progress Notes (Signed)
Height:     Weight: BMI:  Today's Date:  STOP BANG RISK ASSESSMENT S (snore) Have you been told that you snore?     YES   T (tired) Are you often tired, fatigued, or sleepy during the day?   YES  O (obstruction) Do you stop breathing, choke, or gasp during sleep? NO   P (pressure) Do you have or are you being treated for high blood pressure? YES   B (BMI) Is your body index greater than 35 kg/m? NO   A (age) Are you 46 years old or older? NO   N (neck) Do you have a neck circumference greater than 16 inches?   NO   G (gender) Are you a male? YES   TOTAL STOP/BANG "YES" ANSWERS                                                                        For Office Use Only              Procedure Order Form    YES to 3+ Stop Bang questions OR two clinical symptoms - patient qualifies for WatchPAT (CPT 95800)      Clinical Notes: Will consult Sleep Specialist and refer for management of therapy due to patient increased risk of Sleep Apnea. Ordering a sleep study due to the following two clinical symptoms: Excessive daytime sleepiness G47.10 / Gastroesophageal reflux K21.9 / Nocturia R35.1 / Morning Headaches G44.221 / Difficulty concentrating R41.840 / Memory problems or poor judgment G31.84 / Personality changes or irritability R45.4 / Loud snoring R06.83 / Depression F32.9 / Unrefreshed by sleep G47.8 / Impotence N52.9 / History of high blood pressure R03.0 / Insomnia G47.00

## 2022-09-05 NOTE — Progress Notes (Signed)
Heart and Vascular Care Navigation  09/05/2022  Mark Hayden [birth year] [id number]  Reason for Referral: Patient seen in HF TOC. Patient is participating in a Managed Medicaid Plan:Yes  Engaged with patient face to face for initial visit for Heart and Vascular Care Coordination.                                                                                                   Assessment:  Patient is a 47 yo single male who resides home alone. He states he was recently hospitalized and was unable to work for a short time. 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  Patient states he has a car and denies any transportation issues but reports he is low on food and struggling with his rent.                                   HRT/VAS Care Coordination     Patients Home Cardiology Office Heart Failure Clinic  HF [facility]   Outpatient Care Team Social Worker   Social Worker Name: Lasandra Beech, [place] [phone]   Living arrangements for the past 2 months Mobile Home   Lives with: Self   Patient Current Insurance Coverage Medicaid   Patient Has Concern With Paying Medical Bills No   Does Patient Have Prescription Coverage? Yes   Home Assistive Devices/Equipment None   DME Agency NA       Social History:                                                                             SDOH Screenings   Food Insecurity: Food Insecurity Present (09/05/2022)  Housing: Low Risk  (09/05/2022)  Recent Concern: Housing - High Risk (08/17/2022)  Transportation Needs: No Transportation Needs (09/05/2022)  Recent Concern: Transportation Needs - Unmet Transportation Needs (08/17/2022)  Utilities: Not At Risk (09/05/2022)  Recent Concern: Utilities - At Risk (08/17/2022)  Alcohol Screen: Low Risk  (08/20/2022)  Financial Resource Strain: High Risk (09/05/2022)  Tobacco Use: Medium Risk (09/05/2022)    SDOH Interventions: Financial Resources:   Financial Strain Interventions: Other (Comment) (H&V Patient Care Fund) PCF will assist with rental assistance  Food Insecurity:  Food Insecurity Interventions: Other (Comment) (H&V Barista)  Housing Insecurity:  Housing Interventions: Intervention Not Indicated  Transportation:   Transportation Interventions: Intervention Not Indicated   Follow-up plan:  CSW discussed resources for assistance through the PCF to assist with rental assistance. Patient is very grateful for the support to help bridge the gap of being out of work and reduced finances. CSW available as needed. Lasandra Beech, LCSW, CCSW-MCS [phone]

## 2022-09-05 NOTE — Progress Notes (Signed)
ReDS Vest / Clip - 09/05/22 1600       ReDS Vest / Clip   Station Marker C    Ruler Value 31    ReDS Value Range Moderate volume overload    ReDS Actual Value 40

## 2022-09-05 NOTE — Progress Notes (Addendum)
HEART & VASCULAR TRANSITION OF CARE CONSULT NOTE     Referring Physician: Dr. Ella Jubilee Primary Care: None Primary Cardiologist:   HPI: Referred to clinic by Dr. Ella Jubilee with [facility] for heart failure consultation. 47 y.o. male with history of CKD IV and HTN.   He was admitted to OSH with hypertensive crisis in 07/23. Had not been compliant with medications. Scr in the 3s (had been 1.2 in 2021). Echo with EF 60-65%, moderate MR, moderate TR  Admitted 08/17/22 with hypertensive emergency after running out of meds 2 weeks prior. He required nitroglycerin gtt. He was volume overloaded and diuresed with IV lasix. GDMT titrated. Given IV iron for IDA.   Echo with EF 45-50%, global HK, severe LVH, RV okay, moderate to severe MR.  He had AKI on CKD and was seen by Nephrology. Scr peaked at 4.1, improved to 3.5 on discharge.   Readmitted 06/15-06/17 with hypertensive emergency and acute on chronic CHF. Reported adherence but there was concern for compliance with medications. Admits he was eating a lot of salt at work. Scr stable at 3.5. He was diuresed with IV lasix.   He is here today for hospital follow-up. His dyspnea has significantly improved, may get a little short of breath climbing stairs. Orthopnea and PND resolved. No lower extremity edema. His weight is up a couple of lbs on home scale. 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 Limiting fluid intake. Taking all medications as prescribed.  He states that he snores but has not been formally diagnosed with sleep apnea.  [place] moved to the [place] 11 [place]. Does not drink ETOH or use tobacco. Uses marijuana but no other drugs.  His mother had heart failure and CKD, was on dialysis. His sister has HTN, kidney disease and lupus. He believes other maternal relatives may have had CHF but details unclear.     Past Medical History:  Diagnosis Date   CHF (congestive heart failure) (HCC)    Hypertension     Current  Outpatient Medications  Medication Sig Dispense Refill   carvedilol (COREG) 12.5 MG tablet Take 1 tablet (12.5 mg total) by mouth 2 (two) times daily. 180 tablet 3   ferrous sulfate 325 (65 FE) MG tablet Take 1 tablet (325 mg total) by mouth daily with breakfast. 30 tablet 1   furosemide (LASIX) 40 MG tablet Take 1 tablet (40 mg total) by mouth daily. 30 tablet 1   hydrALAZINE (APRESOLINE) 100 MG tablet Take 1 tablet (100 mg total) by mouth 3 (three) times daily. (Patient taking differently: Take 100 mg by mouth 3 (three) times daily. Has only had 1 dose so far today) 90 tablet 1   isosorbide mononitrate (IMDUR) 60 MG 24 hr tablet Take 1.5 tablets (90 mg total) by mouth daily. 60 tablet 1   potassium chloride (KLOR-CON) 10 MEQ tablet Take 1 tablet (10 mEq total) by mouth daily. 90 tablet 3   sodium bicarbonate 650 MG tablet Take 1 tablet (650 mg total) by mouth 2 (two) times daily. 60 tablet 1   No current facility-administered medications for this encounter.    Allergies  Allergen Reactions   Shellfish Allergy Anaphylaxis and Itching    seafood      Social History   Socioeconomic History   Marital status: Single    Spouse name: Not on file   Number of children: 1   Years of education: Not on file   Highest education  level: High school graduate  Occupational History   Occupation: [employer]  Tobacco Use   Smoking status: Former   Smokeless tobacco: Never  Building services engineer Use: Never used  Substance and Sexual Activity   Alcohol use: No   Drug use: No   Sexual activity: Not on file  Other Topics Concern   Not on file  Social History Narrative   Not on file   Social Determinants of Health   Financial Resource Strain: High Risk (09/05/2022)   Overall Financial Resource Strain (CARDIA)    Difficulty of Paying Living Expenses: Hard  Food Insecurity: Food Insecurity Present (09/05/2022)   Hunger Vital Sign    Worried About Running Out of Food in the Last Year: Sometimes  true    Ran Out of Food in the Last Year: Sometimes true  Transportation Needs: No Transportation Needs (09/05/2022)   PRAPARE - Administrator, Civil Service (Medical): No    Lack of Transportation (Non-Medical): No  Recent Concern: Transportation Needs - Unmet Transportation Needs (08/17/2022)   PRAPARE - Administrator, Civil Service (Medical): Yes    Lack of Transportation (Non-Medical): Yes  Physical Activity: Not on file  Stress: Not on file  Social Connections: Not on file  Intimate Partner Violence: Not At Risk (08/17/2022)   Humiliation, Afraid, Rape, and Kick questionnaire    Fear of Current or Ex-Partner: No    Emotionally Abused: No    Physically Abused: No    Sexually Abused: No      Family History  Problem Relation Age of Onset   Diabetes Mother    Diabetes Father     Vitals:   09/05/22 1521  BP: (!) 174/104  Pulse: 81  SpO2: 99%  Weight: 92.4 kg (203 lb 12.8 oz)  Height: 5\' 11"  (1.803 m)    PHYSICAL EXAM: General:  Well appearing.  HEENT: normal Neck: supple. JVP difficult. Carotids 2+ bilat; no bruits.  Cor: PMI nondisplaced. Regular rate & rhythm. No rubs, gallops or murmurs. Lungs: clear Abdomen: soft, nontender, nondistended.  Extremities: no cyanosis, clubbing, rash, 1+ edema Neuro: alert & oriented x 3. Affect pleasant.   ASSESSMENT & PLAN: HFmrEF -Echo 07/23: EF 60-65%, moderate MR, moderate TR -Echo 06/24: EF 45-50%, severe LVH, RV okay, moderate to severe MR -Suspect d/t uncontrolled HTN. Has severe LVH on echo and has family history of CHF. Check cMRI.  NYHA II GDMT  Diuretic-Volume mildly up on exam. ReDS 40%. Takes 40 mg lasix daily. Will take an extra 40 mg tomorrow. Start 10 mEQ potassium supplement daily. Last K was 3.6 2 days ago. Cut back sodium intake. -Switch metoprolol to coreg 12.5 mg BID d/t elevated blood pressure -No ARB/ARNi or MRA d/t CKD -Consider SGLT2i next  HTN -BP not controlled -Untreated  OSA could be contributing. Order sleep study. -Meds adjusted as above -May need workup for secondary causes  MR -Moderate to severe on echo -MRI ordered as above  CKD IV -Scr baseline seems to be around 3.5 -Needs better control of BP -Referred to Nephrology  Snoring -Sleep study ordered   Referred to HFSW (PCP, Medications, Transportation, ETOH Abuse, Drug Abuse, Insurance, Financial ): Yes, check status of medicaid application, food insecurity (given H&V food bag), provided rent assistance Refer to Pharmacy: No Refer to Home Health: No Refer to Advanced Heart Failure Clinic: Yes  Refer to General Cardiology: No  Follow up  2-3 weeks with APP to titrate GDMT  and assess volume, 8 weeks to establish with Dr. Gasper Lloyd and review cardiac MRI

## 2022-09-05 NOTE — Patient Instructions (Addendum)
STOP Metoprolol  START Carvedilol 12.5 mg Twice daily  START Potassium 10 mEq ( 1 Tab ) daily.  Please take an extra 40 mg of lasix TODAY.   Your provider has recommended that you have a home sleep study ([facility] Test).  We have provided you with the equipment in our office today. Please go ahead and download the app. DO NOT OPEN OR TAMPER WITH THE BOX UNTIL WE ADVISE YOU TO DO SO. Once insurance has approved the test our office will call you with PIN number and approval to proceed with testing. Once you have completed the test you just dispose of the equipment, the information is automatically uploaded to [place] via blue-tooth technology. If your test is positive for sleep apnea and you need a home CPAP machine you will be contacted by Dr Norris Cross office [facility]) to set this up.  Your physician has requested that you have a cardiac MRI. Cardiac MRI uses a computer to create images of your heart as its beating, producing both still and moving pictures of your heart and major blood vessels. For further information please visit [email or web address]. Please follow the instruction sheet given to you today for more information. ONCE APPROVED BY YOUR INSURANCE COMPANY YOU WILL BE CALLED TO HAVE THE TEST ARRANGED.   You have been referred to [facility]. They will call you to arrange your appointment.   Your physician recommends that you schedule a follow-up appointment in: 3  WEEKS with our nurse Practitioner and 8 weeks with your Doctor

## 2022-09-05 NOTE — Progress Notes (Signed)
it

## 2022-09-06 ENCOUNTER — Telehealth (HOSPITAL_COMMUNITY): Payer: Self-pay

## 2022-09-06 LAB — CULTURE, BLOOD (ROUTINE X 2)
Special Requests: ADEQUATE
Special Requests: ADEQUATE

## 2022-09-06 NOTE — Telephone Encounter (Signed)
Referral faxed to [facility].  Confirmation of transmission received

## 2022-09-07 ENCOUNTER — Telehealth (HOSPITAL_COMMUNITY): Payer: Self-pay | Admitting: Licensed Clinical Social Worker

## 2022-09-07 NOTE — Telephone Encounter (Signed)
CSW received message from patient's landlord regarding W9 for assistance with rent through the Patient Care Fund. CSW returned call spoke with staff and provided requested information and awaiting word back from the office. CSW left message for patient with update regarding status and need for his income verification. Lasandra Beech, LCSW, CCSW-MCS [phone]

## 2022-09-10 ENCOUNTER — Ambulatory Visit (INDEPENDENT_AMBULATORY_CARE_PROVIDER_SITE_OTHER): Payer: Commercial Managed Care - HMO | Admitting: Student

## 2022-09-10 ENCOUNTER — Encounter: Payer: Self-pay | Admitting: Student

## 2022-09-10 VITALS — BP 179/101 | HR 69 | Temp 97.9°F | Ht 71.0 in | Wt 210.4 lb

## 2022-09-10 DIAGNOSIS — I10 Essential (primary) hypertension: Secondary | ICD-10-CM | POA: Insufficient documentation

## 2022-09-10 DIAGNOSIS — I5033 Acute on chronic diastolic (congestive) heart failure: Secondary | ICD-10-CM

## 2022-09-10 DIAGNOSIS — N184 Chronic kidney disease, stage 4 (severe): Secondary | ICD-10-CM

## 2022-09-10 DIAGNOSIS — I130 Hypertensive heart and chronic kidney disease with heart failure and stage 1 through stage 4 chronic kidney disease, or unspecified chronic kidney disease: Secondary | ICD-10-CM | POA: Diagnosis not present

## 2022-09-10 DIAGNOSIS — J189 Pneumonia, unspecified organism: Secondary | ICD-10-CM | POA: Diagnosis not present

## 2022-09-10 MED ORDER — BLOOD PRESSURE MONITOR DEVI: 0 refills | Status: DC

## 2022-09-10 MED ORDER — CARVEDILOL 25 MG PO TABS: 25.0000 mg | ORAL_TABLET | Freq: Two times a day (BID) | ORAL | 11 refills | Status: DC

## 2022-09-10 NOTE — Assessment & Plan Note (Signed)
Patient feels improved after hospital visit for pneumonia. Denies recent fever or chills, but has residual cough. Will continue to monitor.

## 2022-09-10 NOTE — Assessment & Plan Note (Addendum)
Baseline creatinine around 3.5. BMP last week shows creatine 3.59 around his baseline. He was evaluated by nephrology during his hospitalization May but it does not seem like he has followed up with them in the outpatient. Referral to [facility] placed. On sodium bicarb 650 mg twice daily.

## 2022-09-10 NOTE — Assessment & Plan Note (Signed)
Patient is currently taking furosemide and coreg and tolerating them well. He is euvolemic today and reports feeling well for the past week, without trouble sleeping. Denies PND, orthopnea, SOB, chest pain, peripheral edema. He usually uses 3 pillows to sleep better.   He is to continue with the current medication regimen and follow up with cardiology in August.

## 2022-09-10 NOTE — Progress Notes (Signed)
This is a Psychologist, occupational Note.  The care of the patient was discussed with Dr. Dr. Kirke Corin and the assessment and plan was formulated with their assistance.  Please see their note for official documentation of the patient encounter.   Subjective:   Patient ID: Mark Hayden male   DOB: [birth year] 47 y.o.   MRN: [id number]  HPI: [name] is a 47 y.o. with a PMH of HTN, CKD, and CHF who presents to establish care and for a HFU. At hospital visit, patient had radiological evidence of LLL pneumonia and treated with antibiotics. Patient prescribed regimen to treat CHF, CKD, and HTN. Patient recently follow up with cardiology on 6/19, where medications changed to coreg instead of metoprolol. Patient is taking Coreg, furosemide, ferrous sulfate, hydralazine, isosorbide monocitrate, potassium chloride, and sodium bicarbonate as prescribed as is tolerating them well.   PMH:  Undiagnosed sleep apnea Surgeries: None Hospitalizations: Reports history of past hospitalizations for SOB Vaccinations: Recently received pneumonia vaccine after having pneumonia. Has not had COVID or influenza vaccines.   Allergies: shellfish/fish  SH:  He works at [employer] about 40 hrs/week and started working there 1.5 months ago. He reports difficulty making dietary adjustments because he eats food available at his work sometimes. Outside of his work he tries to make appropriate dietary changes, eating [place] sandwiches and cereal. He lives by himself. Has been trying to limit liquids, has about 40 oz of either soda or water during work day. Knows limit is 64 oz Tobacco: Previously smoked but quit 6-7 years ago. Previously smoked 1 pack/week. Started 13 years.  Alcohol: No  Drugs: Use of cannabis currently.  Caffeine: Drinks approximately 1 L of soda  day when he is at work  Sexual active: Not currently Transportation: Car  FH:  YQI:HKVQQV, passed from HF. HTN, kidney failure and diabetes.   Dad: diabetes, stroke in 47s Siblings: HTN, lupus, kidney failure  For the details of today's visit, please refer to the assessment and plan.   Past Medical History:  Diagnosis Date   CHF (congestive heart failure) (HCC)    Hypertension    Current Outpatient Medications  Medication Sig Dispense Refill   Blood Pressure Monitor DEVI Use to check blood pressure daily 1 each 0   carvedilol (COREG) 25 MG tablet Take 1 tablet (25 mg total) by mouth 2 (two) times daily. 60 tablet 11   ferrous sulfate 325 (65 FE) MG tablet Take 1 tablet (325 mg total) by mouth daily with breakfast. 30 tablet 1   furosemide (LASIX) 40 MG tablet Take 1 tablet (40 mg total) by mouth daily. 30 tablet 1   hydrALAZINE (APRESOLINE) 100 MG tablet Take 1 tablet (100 mg total) by mouth 3 (three) times daily. (Patient taking differently: Take 100 mg by mouth 3 (three) times daily. Has only had 1 dose so far today) 90 tablet 1   isosorbide mononitrate (IMDUR) 60 MG 24 hr tablet Take 1.5 tablets (90 mg total) by mouth daily. 60 tablet 1   potassium chloride (KLOR-CON) 10 MEQ tablet Take 1 tablet (10 mEq total) by mouth daily. 90 tablet 3   sodium bicarbonate 650 MG tablet Take 1 tablet (650 mg total) by mouth 2 (two) times daily. 60 tablet 1   No current facility-administered medications for this visit.   Family History  Problem Relation Age of Onset   Diabetes Mother    Diabetes Father    Social History   Socioeconomic History   Marital status: Single  Spouse name: Not on file   Number of children: 1   Years of education: Not on file   Highest education level: High school graduate  Occupational History   Occupation: [employer]  Tobacco Use   Smoking status: Former   Smokeless tobacco: Never  Building services engineer Use: Never used  Substance and Sexual Activity   Alcohol use: No   Drug use: No   Sexual activity: Not on file  Other Topics Concern   Not on file  Social History Narrative   Not on file    Social Determinants of Health   Financial Resource Strain: High Risk (09/05/2022)   Overall Financial Resource Strain (CARDIA)    Difficulty of Paying Living Expenses: Hard  Food Insecurity: Food Insecurity Present (09/05/2022)   Hunger Vital Sign    Worried About Running Out of Food in the Last Year: Sometimes true    Ran Out of Food in the Last Year: Sometimes true  Transportation Needs: No Transportation Needs (09/05/2022)   PRAPARE - Administrator, Civil Service (Medical): No    Lack of Transportation (Non-Medical): No  Recent Concern: Transportation Needs - Unmet Transportation Needs (08/17/2022)   PRAPARE - Administrator, Civil Service (Medical): Yes    Lack of Transportation (Non-Medical): Yes  Physical Activity: Not on file  Stress: Not on file  Social Connections: Not on file   Review of Systems: Pertinent items are noted in HPI. Objective:  Physical Exam: Vitals:   09/10/22 1459 09/10/22 1623  BP: (!) 159/93 (!) 179/101  Pulse: 80 69  Temp: 97.9 F (36.6 C)   TempSrc: Oral   SpO2: 100%   Weight: 210 lb 6.4 oz (95.4 kg)   Height: 5\' 11"  (1.803 m)     Constitutional: NAD, appears comfortable Cardiovascular: RRR, no murmurs, rubs, or gallops.  Pulmonary/Chest: CTAB, no wheezes, rales, or rhonchi. No chest wall abnormalities.  Extremities: No edema.  Psychiatric: Normal mood and affect  Assessment & Plan:   Acute on chronic diastolic CHF (congestive heart failure) (HCC) Patient is currently taking furosemide and coreg and tolerating them well. He is euvolemic today and reports feeling well for the past week, without trouble sleeping. Denies PND, orthopnea, SOB, chest pain, peripheral edema. He usually uses 3 pillows to sleep better.   He is to continue with the current medication regimen and follow up with cardiology in August.   LLL pneumonia Patient feels improved after hospital visit for pneumonia. Denies recent fever or chills, but  has residual cough. Will continue to monitor.   CKD (chronic kidney disease) stage 4, GFR 15-29 ml/min (HCC) Patient is established with nephrology, baseline creatinine about 3.5. Encouraged to continue follow up.   Hypertension Patient bp today elevated on repeat 179/101. Patient is taking medications as prescribed. Will adjust coreg to 25 mg BID instead of 12.5 mg BID.

## 2022-09-10 NOTE — Assessment & Plan Note (Addendum)
Patient bp today elevated on repeat 179/101. Patient is taking medications as prescribed. Will adjust coreg to 25 mg BID instead of 12.5 mg BID.

## 2022-09-10 NOTE — Patient Instructions (Signed)
Thank you, [name] for allowing [place] to provide your care today. Today we discussed your medications, blood pressure, congestive heart failure, and kidney disease. Please continue to follow up with cardiology and nephrology. Due to your elevated blood pressure, I have ordered the following medication/changed the following medications:  Increase Coreg to 25 mg twice a day. I have ordered you a blood pressure cuff, and it can be picked up at your pharmacy.   My Chart Access: [email or web address]  Please follow-up in 1 month.  Please make sure to arrive 15 minutes prior to your next appointment. If you arrive late, you may be asked to reschedule.    We look forward to seeing you next time. Please call our clinic at [phone] if you have any questions or concerns. The best time to call is Monday-Friday from 9am-4pm, but there is someone available 24/7. If after hours or the weekend, call the main hospital number and ask for the Internal Medicine Resident On-Call. If you need medication refills, please notify your pharmacy one week in advance and they will send [place] a request.   Thank you for letting us take part in your care. Wishing you the best!

## 2022-09-12 ENCOUNTER — Telehealth (HOSPITAL_COMMUNITY): Payer: Self-pay | Admitting: Licensed Clinical Social Worker

## 2022-09-12 NOTE — Telephone Encounter (Signed)
H&V Care Navigation CSW Progress Note  Clinical Social Worker contacted patient by phone to follow up on Patient Care Fund request for assistance with rent.  Patient is participating in a Managed Medicaid Plan:  Yes  Patient is struggling financially due to recent illness and hospitalization with paying for his rent. Patient provided required documents for eligibility and landlord provided invoice and W9. Patient grateful for the assistance and support. CSW continues to be available as needed. Lasandra Beech, LCSW, CCSW-MCS [phone]   SDOH Screenings   Food Insecurity: Food Insecurity Present (09/05/2022)  Housing: Low Risk  (09/05/2022)  Recent Concern: Housing - High Risk (08/17/2022)  Transportation Needs: No Transportation Needs (09/05/2022)  Recent Concern: Transportation Needs - Unmet Transportation Needs (08/17/2022)  Utilities: Not At Risk (09/05/2022)  Recent Concern: Utilities - At Risk (08/17/2022)  Alcohol Screen: Low Risk  (08/20/2022)  Financial Resource Strain: High Risk (09/12/2022)  Tobacco Use: Medium Risk (09/10/2022)

## 2022-09-13 NOTE — Progress Notes (Signed)
Internal Medicine Clinic Attending  Case discussed with Dr. Amponsah  At the time of the visit.  We reviewed the resident's history and exam and pertinent patient test results.  I agree with the assessment, diagnosis, and plan of care documented in the resident's note.  

## 2022-09-17 DIAGNOSIS — Z419 Encounter for procedure for purposes other than remedying health state, unspecified: Secondary | ICD-10-CM | POA: Diagnosis not present

## 2022-10-01 ENCOUNTER — Inpatient Hospital Stay (HOSPITAL_COMMUNITY)
Admission: RE | Admit: 2022-10-01 | Discharge: 2022-10-01 | Disposition: A | Discharge status: Home / Self Care | Payer: Medicaid Other | Source: Ambulatory Visit

## 2022-10-01 NOTE — Progress Notes (Signed)
PATIENT NO SHOW FOR APPT, NOTE LEFT FOR TEMPLATING PURPOSE     Primary Care: Lovie Macadamia, MD HF Cardiologist: Dr. Gasper Lloyd.  HPI: 47 y.o. male with history of CKD IV and HTN.    He was admitted to OSH with hypertensive crisis in 7/23. Had not been compliant with medications. Scr in the 3s (had been 1.2 in 2021). Echo with EF 60-65%, moderate MR, moderate TR   Admitted 08/17/22 with hypertensive emergency after running out of meds 2 weeks prior. He required nitroglycerin gtt. He was volume overloaded and diuresed with IV lasix. GDMT titrated. Given IV iron for IDA. Echo with EF 45-50%, global HK, severe LVH, RV okay, moderate to severe MR. He had AKI on CKD and was seen by Nephrology. Scr peaked at 4.1, improved to 3.5 on discharge.    Readmitted 6/24 with hypertensive emergency and acute on chronic HF. Reported adherence but there was concern for compliance with medications. Admits he was eating a lot of salt at work. Scr stable at 3.5. He was diuresed with IV lasix.   Seen in [facility] 6/24 for post hospital follow up. NYHA II and volume up, ReDs 40%. Advised to take extra 40 mg Lasix, Toprol switched to Coreg for better BP control and cMRI arranged. Referred to AHF clinic.  Today he presents to AHF for HF follow up, referred from [facility]. Overall feeling fine. Denies increasing SOB, CP, dizziness, edema, or PND/Orthopnea. Appetite ok. No fever or chills. Weight at home 170 pounds. Taking all medications. [place] moved to the [place] 11 years ago. Does not drink ETOH or use tobacco. Uses marijuana but no other drugs.   Family Hx: His mother had heart failure and CKD, was on dialysis. His sister has HTN, kidney disease and lupus. He believes other maternal relatives may have had CHF but details unclear.  Cardiac Studies   - Echo (5/24): EF 45-50%, global HK, severe LVH, RV ok, moderate to severe MR  - Echo (7/23): EF 60-65%, moderate MR/TR  Review of Systems: [y] = yes, [ ]  = no    General: Weight gain [ ] ; Weight loss [ ] ; Anorexia [ ] ; Fatigue [ ] ; Fever [ ] ; Chills [ ] ; Weakness [ ]   Cardiac: Chest pain/pressure [ ] ; Resting SOB [ ] ; Exertional SOB [ ] ; Orthopnea [ ] ; Pedal Edema [ ] ; Palpitations [ ] ; Syncope [ ] ; Presyncope [ ] ; Paroxysmal nocturnal dyspnea[ ]   Pulmonary: Cough [ ] ; Wheezing[ ] ; Hemoptysis[ ] ; Sputum [ ] ; Snoring [place]ienne ]  GI: Vomiting[ ] ; Dysphagia[ ] ; Melena[ ] ; Hematochezia [ ] ; Heartburn[ ] ; Abdominal pain [ ] ; Constipation [ ] ; Diarrhea [ ] ; BRBPR [ ]   GU: Hematuria[ ] ; Dysuria [ ] ; Nocturia[ ]   Vascular: Pain in legs with walking [ ] ; Pain in feet with lying flat [ ] ; Non-healing sores [ ] ; Stroke [ ] ; TIA [ ] ; Slurred speech [ ] ;  Neuro: Headaches[ ] ; Vertigo[ ] ; Seizures[ ] ; Paresthesias[ ] ;Blurred vision [ ] ; Diplopia [ ] ; Vision changes [ ]   Ortho/Skin: Arthritis [ ] ; Joint pain [ ] ; Muscle pain [ ] ; Joint swelling [ ] ; Back Pain [ ] ; Rash [ ]   Psych: Depression[ ] ; Anxiety[ ]   Heme: Bleeding problems [ ] ; Clotting disorders [ ] ; Anemia [place]ove[place]ienne ]  Endocrine: Diabetes [ ] ; Thyroid dysfunction[ ]    Past Medical History:  Diagnosis Date   CHF (congestive heart failure) (HCC)    Hypertension    Hypertensive emergency 08/20/2022   Suicidal thoughts 09/08/2019  Current Outpatient Medications  Medication Sig Dispense Refill   Blood Pressure Monitor DEVI Use to check blood pressure daily 1 each 0   carvedilol (COREG) 25 MG tablet Take 1 tablet (25 mg total) by mouth 2 (two) times daily. 60 tablet 11   ferrous sulfate 325 (65 FE) MG tablet Take 1 tablet (325 mg total) by mouth daily with breakfast. 30 tablet 1   furosemide (LASIX) 40 MG tablet Take 1 tablet (40 mg total) by mouth daily. 30 tablet 1   hydrALAZINE (APRESOLINE) 100 MG tablet Take 1 tablet (100 mg total) by mouth 3 (three) times daily. (Patient taking differently: Take 100 mg by mouth 3 (three) times daily. Has only had 1 dose so far today) 90 tablet 1   isosorbide mononitrate (IMDUR)  60 MG 24 hr tablet Take 1.5 tablets (90 mg total) by mouth daily. 60 tablet 1   potassium chloride (KLOR-CON) 10 MEQ tablet Take 1 tablet (10 mEq total) by mouth daily. 90 tablet 3   sodium bicarbonate 650 MG tablet Take 1 tablet (650 mg total) by mouth 2 (two) times daily. 60 tablet 1   No current facility-administered medications for this visit.    Allergies  Allergen Reactions   Shellfish Allergy Anaphylaxis and Itching    seafood   Social History   Socioeconomic History   Marital status: Single    Spouse name: Not on file   Number of children: 1   Years of education: Not on file   Highest education level: High school graduate  Occupational History   Occupation: [employer]  Tobacco Use   Smoking status: Former   Smokeless tobacco: Never  Advertising account planner   Vaping status: Never Used  Substance and Sexual Activity   Alcohol use: No   Drug use: No   Sexual activity: Not on file  Other Topics Concern   Not on file  Social History Narrative   Not on file   Social Determinants of Health   Financial Resource Strain: High Risk (09/12/2022)   Overall Financial Resource Strain (CARDIA)    Difficulty of Paying Living Expenses: Very hard  Food Insecurity: Food Insecurity Present (09/05/2022)   Hunger Vital Sign    Worried About Running Out of Food in the Last Year: Sometimes true    Ran Out of Food in the Last Year: Sometimes true  Transportation Needs: No Transportation Needs (09/05/2022)   PRAPARE - Administrator, Civil Service (Medical): No    Lack of Transportation (Non-Medical): No  Recent Concern: Transportation Needs - Unmet Transportation Needs (08/17/2022)   PRAPARE - Administrator, Civil Service (Medical): Yes    Lack of Transportation (Non-Medical): Yes  Physical Activity: Not on file  Stress: Not on file  Social Connections: Not on file  Intimate Partner Violence: Not At Risk (08/17/2022)   Humiliation, Afraid, Rape, and Kick questionnaire     Fear of Current or Ex-Partner: No    Emotionally Abused: No    Physically Abused: No    Sexually Abused: No   Family History  Problem Relation Age of Onset   Diabetes Mother    Diabetes Father    There were no vitals filed for this visit.  PHYSICAL EXAM: General:  NAD. No resp difficulty HEENT: Normal Neck: Supple. No JVD. Carotids 2+ bilat; no bruits. No lymphadenopathy or thryomegaly appreciated. Cor: PMI nondisplaced. Regular rate & rhythm. No rubs, gallops or murmurs. Lungs: Clear Abdomen: Soft, nontender, nondistended. No hepatosplenomegaly.  No bruits or masses. Good bowel sounds. Extremities: No cyanosis, clubbing, rash, edema Neuro: Alert & oriented x 3, cranial nerves grossly intact. Moves all 4 extremities w/o difficulty. Affect pleasant.  REDs:   ASSESSMENT & PLAN: HFmrEF - Echo (7/23): EF 60-65%, moderate MR, moderate TR - Echo (6/24): EF 45-50%, severe LVH, RV okay, moderate to severe MR - Suspect CM d/t uncontrolled HTN. Has severe LVH on echo, and has family history of CHF-->cMRI has been ordered and scheduled for 11/21/22.  - NYHA II, volume OK today. - Start Jardiance 10 mg daily. - Continue Lasix 40 mg daily. - Continue Coreg 25 mg bid. - Continue hydralazine 100 mg tid + Imdur 90 mg daily. - No ARB/ARNi/MRA d/t CKD - Labs today.  2. HTN - BP not controlled - Untreated OSA could be contributing.  - Meds adjusted as above - May need workup for secondary causes   3. MR - Moderate to severe on echo - MRI ordered as above   4. CKD IV - Scr baseline ~ 3.5 - Needs better control of BP - Referred to Nephrology - Start SGLT2i as above.   5. Snoring - Sleep study ordered  6. SDOH - HFSW helping with Medicaid app, rent assistance and food.  Follow up next month with Dr. Gasper Lloyd, as scheduled.   Prince Rome, FNP-BC 10/01/22

## 2022-10-02 ENCOUNTER — Telehealth (HOSPITAL_COMMUNITY): Payer: Self-pay | Admitting: Licensed Clinical Social Worker

## 2022-10-02 NOTE — Telephone Encounter (Signed)
CSW attempted to contact patient to follow up on rent assistance. Message left for return call. Lasandra Beech, LCSW, CCSW-MCS [phone]

## 2022-10-03 ENCOUNTER — Telehealth (HOSPITAL_COMMUNITY): Payer: Self-pay | Admitting: Licensed Clinical Social Worker

## 2022-10-03 NOTE — Telephone Encounter (Signed)
CSW contacted patient at [phone] which was provided by family member. CSW left message regarding rent check ready and for return call asap. Lasandra Beech, LCSW, CCSW-MCS [phone]

## 2022-10-05 ENCOUNTER — Telehealth (HOSPITAL_COMMUNITY): Payer: Self-pay | Admitting: Licensed Clinical Social Worker

## 2022-10-05 NOTE — Telephone Encounter (Signed)
H&V Care Navigation CSW Progress Note  Clinical Social Worker attempted to call pt at mobile number to inquire about how to send rental assistance check but unable to reach- left VM  Because attempts have been made x3 to reach patient CSW contacted pt dtr to inquire if she had a better way to contact- she provided CSW with an alternative number which CSW added to chart- attempted to reach pt at this number and had to leave VM.  Patient is participating in a Managed Medicaid Plan:  Yes  SDOH Screenings   Food Insecurity: Food Insecurity Present (09/05/2022)  Housing: Low Risk  (09/05/2022)  Recent Concern: Housing - High Risk (08/17/2022)  Transportation Needs: No Transportation Needs (09/05/2022)  Recent Concern: Transportation Needs - Unmet Transportation Needs (08/17/2022)  Utilities: Not At Risk (09/05/2022)  Recent Concern: Utilities - At Risk (08/17/2022)  Alcohol Screen: Low Risk  (08/20/2022)  Financial Resource Strain: High Risk (09/12/2022)  Tobacco Use: Medium Risk (09/10/2022)   Burna Sis, LCSW Clinical Social Worker [facility] Desk#: [phone] Cell#: [phone]

## 2022-10-08 ENCOUNTER — Encounter: Payer: Medicaid Other | Admitting: Student

## 2022-10-10 ENCOUNTER — Telehealth (HOSPITAL_COMMUNITY): Payer: Self-pay | Admitting: Licensed Clinical Social Worker

## 2022-10-10 NOTE — Telephone Encounter (Signed)
CSW attempted to contact patient again to follow up on rent assistance. CSW left message that rent assistance is ready and to contact CSW asap. Lasandra Beech, LCSW, CCSW-MCS [phone]

## 2022-10-18 DIAGNOSIS — Z419 Encounter for procedure for purposes other than remedying health state, unspecified: Secondary | ICD-10-CM | POA: Diagnosis not present

## 2022-10-28 ENCOUNTER — Emergency Department (HOSPITAL_BASED_OUTPATIENT_CLINIC_OR_DEPARTMENT_OTHER): Payer: Commercial Managed Care - HMO | Admitting: Radiology

## 2022-10-28 ENCOUNTER — Encounter (HOSPITAL_BASED_OUTPATIENT_CLINIC_OR_DEPARTMENT_OTHER): Payer: Self-pay | Admitting: Emergency Medicine

## 2022-10-28 ENCOUNTER — Emergency Department (HOSPITAL_BASED_OUTPATIENT_CLINIC_OR_DEPARTMENT_OTHER): Payer: Commercial Managed Care - HMO

## 2022-10-28 ENCOUNTER — Inpatient Hospital Stay (HOSPITAL_BASED_OUTPATIENT_CLINIC_OR_DEPARTMENT_OTHER)
Admission: EM | Admit: 2022-10-28 | Discharge: 2022-10-30 | DRG: 291 | Disposition: A | Discharge status: Home / Self Care | Payer: Commercial Managed Care - HMO | Attending: Internal Medicine | Admitting: Internal Medicine

## 2022-10-28 ENCOUNTER — Other Ambulatory Visit: Payer: Self-pay

## 2022-10-28 DIAGNOSIS — Z91013 Allergy to seafood: Secondary | ICD-10-CM

## 2022-10-28 DIAGNOSIS — N179 Acute kidney failure, unspecified: Secondary | ICD-10-CM | POA: Diagnosis not present

## 2022-10-28 DIAGNOSIS — R042 Hemoptysis: Secondary | ICD-10-CM | POA: Diagnosis not present

## 2022-10-28 DIAGNOSIS — Z91128 Patient's intentional underdosing of medication regimen for other reason: Secondary | ICD-10-CM

## 2022-10-28 DIAGNOSIS — Z1152 Encounter for screening for COVID-19: Secondary | ICD-10-CM | POA: Diagnosis not present

## 2022-10-28 DIAGNOSIS — J189 Pneumonia, unspecified organism: Secondary | ICD-10-CM | POA: Diagnosis not present

## 2022-10-28 DIAGNOSIS — Y838 Other surgical procedures as the cause of abnormal reaction of the patient, or of later complication, without mention of misadventure at the time of the procedure: Secondary | ICD-10-CM | POA: Diagnosis not present

## 2022-10-28 DIAGNOSIS — R0981 Nasal congestion: Secondary | ICD-10-CM | POA: Diagnosis present

## 2022-10-28 DIAGNOSIS — I509 Heart failure, unspecified: Secondary | ICD-10-CM

## 2022-10-28 DIAGNOSIS — D631 Anemia in chronic kidney disease: Secondary | ICD-10-CM | POA: Diagnosis not present

## 2022-10-28 DIAGNOSIS — Z8249 Family history of ischemic heart disease and other diseases of the circulatory system: Secondary | ICD-10-CM | POA: Diagnosis not present

## 2022-10-28 DIAGNOSIS — Z7689 Persons encountering health services in other specified circumstances: Secondary | ICD-10-CM | POA: Diagnosis not present

## 2022-10-28 DIAGNOSIS — Z8269 Family history of other diseases of the musculoskeletal system and connective tissue: Secondary | ICD-10-CM | POA: Diagnosis not present

## 2022-10-28 DIAGNOSIS — R918 Other nonspecific abnormal finding of lung field: Secondary | ICD-10-CM | POA: Diagnosis not present

## 2022-10-28 DIAGNOSIS — J95811 Postprocedural pneumothorax: Secondary | ICD-10-CM | POA: Diagnosis not present

## 2022-10-28 DIAGNOSIS — K225 Diverticulum of esophagus, acquired: Secondary | ICD-10-CM | POA: Diagnosis not present

## 2022-10-28 DIAGNOSIS — J918 Pleural effusion in other conditions classified elsewhere: Secondary | ICD-10-CM | POA: Diagnosis present

## 2022-10-28 DIAGNOSIS — R590 Localized enlarged lymph nodes: Secondary | ICD-10-CM | POA: Diagnosis present

## 2022-10-28 DIAGNOSIS — Z79899 Other long term (current) drug therapy: Secondary | ICD-10-CM | POA: Diagnosis not present

## 2022-10-28 DIAGNOSIS — Z87891 Personal history of nicotine dependence: Secondary | ICD-10-CM

## 2022-10-28 DIAGNOSIS — I130 Hypertensive heart and chronic kidney disease with heart failure and stage 1 through stage 4 chronic kidney disease, or unspecified chronic kidney disease: Principal | ICD-10-CM | POA: Diagnosis present

## 2022-10-28 DIAGNOSIS — Z833 Family history of diabetes mellitus: Secondary | ICD-10-CM | POA: Diagnosis not present

## 2022-10-28 DIAGNOSIS — I5023 Acute on chronic systolic (congestive) heart failure: Secondary | ICD-10-CM | POA: Diagnosis present

## 2022-10-28 DIAGNOSIS — J984 Other disorders of lung: Secondary | ICD-10-CM | POA: Diagnosis not present

## 2022-10-28 DIAGNOSIS — I160 Hypertensive urgency: Secondary | ICD-10-CM

## 2022-10-28 DIAGNOSIS — N184 Chronic kidney disease, stage 4 (severe): Secondary | ICD-10-CM | POA: Diagnosis present

## 2022-10-28 LAB — CBC WITH DIFFERENTIAL/PLATELET
Abs Immature Granulocytes: 0.01 10*3/uL (ref 0.00–0.07)
Basophils Absolute: 0.1 10*3/uL (ref 0.0–0.1)
Basophils Relative: 1 %
Eosinophils Absolute: 0.3 10*3/uL (ref 0.0–0.5)
Eosinophils Relative: 3 %
HCT: 35 % — ABNORMAL LOW (ref 39.0–52.0)
Hemoglobin: 11.9 g/dL — ABNORMAL LOW (ref 13.0–17.0)
Immature Granulocytes: 0 %
Lymphocytes Relative: 10 %
Lymphs Abs: 0.9 10*3/uL (ref 0.7–4.0)
MCH: 30.7 pg (ref 26.0–34.0)
MCHC: 34 g/dL (ref 30.0–36.0)
MCV: 90.2 fL (ref 80.0–100.0)
Monocytes Absolute: 0.5 10*3/uL (ref 0.1–1.0)
Monocytes Relative: 6 %
Neutro Abs: 7 10*3/uL (ref 1.7–7.7)
Neutrophils Relative %: 80 %
Platelets: 184 10*3/uL (ref 150–400)
RBC: 3.88 MIL/uL — ABNORMAL LOW (ref 4.22–5.81)
RDW: 13 % (ref 11.5–15.5)
WBC: 8.7 10*3/uL (ref 4.0–10.5)
nRBC: 0 % (ref 0.0–0.2)

## 2022-10-28 LAB — BRAIN NATRIURETIC PEPTIDE: B Natriuretic Peptide: 505.4 pg/mL — ABNORMAL HIGH (ref 0.0–100.0)

## 2022-10-28 LAB — COMPREHENSIVE METABOLIC PANEL
ALT: 15 U/L (ref 0–44)
AST: 24 U/L (ref 15–41)
Albumin: 4.2 g/dL (ref 3.5–5.0)
Alkaline Phosphatase: 90 U/L (ref 38–126)
Anion gap: 10 (ref 5–15)
BUN: 41 mg/dL — ABNORMAL HIGH (ref 6–20)
CO2: 18 mmol/L — ABNORMAL LOW (ref 22–32)
Calcium: 8.7 mg/dL — ABNORMAL LOW (ref 8.9–10.3)
Chloride: 110 mmol/L (ref 98–111)
Creatinine, Ser: 3.77 mg/dL — ABNORMAL HIGH (ref 0.61–1.24)
GFR, Estimated: 19 mL/min — ABNORMAL LOW (ref >60)
Glucose, Bld: 101 mg/dL — ABNORMAL HIGH (ref 70–99)
Potassium: 4.5 mmol/L (ref 3.5–5.1)
Sodium: 138 mmol/L (ref 135–145)
Total Bilirubin: 0.5 mg/dL (ref 0.3–1.2)
Total Protein: 7.5 g/dL (ref 6.5–8.1)

## 2022-10-28 LAB — RESP PANEL BY RT-PCR (RSV, FLU A&B, COVID)  RVPGX2
Influenza A by PCR: NEGATIVE
Influenza B by PCR: NEGATIVE
Resp Syncytial Virus by PCR: NEGATIVE
SARS Coronavirus 2 by RT PCR: NEGATIVE

## 2022-10-28 MED ORDER — FUROSEMIDE 10 MG/ML IJ SOLN
80.0000 mg | Freq: Once | INTRAMUSCULAR | Status: AC
  Administered 2022-10-28: 80 mg via INTRAVENOUS
  Filled 2022-10-28: qty 8

## 2022-10-28 MED ORDER — ACETAMINOPHEN 650 MG RE SUPP: 650.0000 mg | Freq: Four times a day (QID) | RECTAL | Status: DC | PRN

## 2022-10-28 MED ORDER — HYDRALAZINE HCL 50 MG PO TABS
100.0000 mg | ORAL_TABLET | Freq: Three times a day (TID) | ORAL | Status: DC
  Administered 2022-10-28 – 2022-10-30 (×6): 100 mg via ORAL
  Filled 2022-10-28 (×6): qty 2

## 2022-10-28 MED ORDER — HYDRALAZINE HCL 25 MG PO TABS
100.0000 mg | ORAL_TABLET | Freq: Once | ORAL | Status: AC
  Administered 2022-10-28: 100 mg via ORAL
  Filled 2022-10-28: qty 4

## 2022-10-28 MED ORDER — AZITHROMYCIN 250 MG PO TABS
500.0000 mg | ORAL_TABLET | Freq: Every day | ORAL | Status: DC
  Administered 2022-10-28 – 2022-10-29 (×2): 500 mg via ORAL
  Filled 2022-10-28 (×2): qty 2

## 2022-10-28 MED ORDER — ISOSORBIDE MONONITRATE ER 60 MG PO TB24
90.0000 mg | ORAL_TABLET | Freq: Every day | ORAL | Status: DC
  Administered 2022-10-28 – 2022-10-30 (×3): 90 mg via ORAL
  Filled 2022-10-28 (×3): qty 1

## 2022-10-28 MED ORDER — ACETAMINOPHEN 325 MG PO TABS
650.0000 mg | ORAL_TABLET | Freq: Four times a day (QID) | ORAL | Status: DC | PRN
  Administered 2022-10-29 – 2022-10-30 (×2): 650 mg via ORAL
  Filled 2022-10-28 (×2): qty 2

## 2022-10-28 MED ORDER — SODIUM CHLORIDE 0.9 % IV SOLN
2.0000 g | INTRAVENOUS | Status: DC
  Administered 2022-10-29: 2 g via INTRAVENOUS
  Filled 2022-10-28: qty 20

## 2022-10-28 MED ORDER — CARVEDILOL 25 MG PO TABS
25.0000 mg | ORAL_TABLET | Freq: Two times a day (BID) | ORAL | Status: DC
  Administered 2022-10-28 – 2022-10-30 (×4): 25 mg via ORAL
  Filled 2022-10-28 (×4): qty 1

## 2022-10-28 MED ORDER — SODIUM CHLORIDE 0.9 % IV SOLN
2.0000 g | INTRAVENOUS | Status: DC
  Administered 2022-10-28: 2 g via INTRAVENOUS
  Filled 2022-10-28: qty 12.5

## 2022-10-28 MED ORDER — LABETALOL HCL 5 MG/ML IV SOLN
20.0000 mg | Freq: Once | INTRAVENOUS | Status: AC
  Administered 2022-10-28: 20 mg via INTRAVENOUS
  Filled 2022-10-28: qty 4

## 2022-10-28 MED ORDER — ENOXAPARIN SODIUM 30 MG/0.3ML IJ SOSY
30.0000 mg | PREFILLED_SYRINGE | INTRAMUSCULAR | Status: DC
  Administered 2022-10-28 – 2022-10-29 (×2): 30 mg via SUBCUTANEOUS
  Filled 2022-10-28 (×2): qty 0.3

## 2022-10-28 MED ORDER — VANCOMYCIN HCL 1750 MG/350ML IV SOLN
1750.0000 mg | Freq: Once | INTRAVENOUS | Status: AC
  Administered 2022-10-28: 1750 mg via INTRAVENOUS
  Filled 2022-10-28 (×2): qty 350

## 2022-10-28 MED ORDER — SODIUM CHLORIDE 0.9 % IV SOLN
1.0000 g | Freq: Two times a day (BID) | INTRAVENOUS | Status: DC
  Filled 2022-10-28 (×2): qty 10

## 2022-10-28 NOTE — Progress Notes (Signed)
Plan of Care Note for accepted transfer   Patient: Mark Hayden MRN: [id number]   DOA: 10/28/2022  Facility requesting transfer: [facility] Requesting Provider: Dr. Rhunette Croft Reason for transfer: multifocal pneumonia  Facility course:  47 y.o. 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 He also complains of shortness of breath.   Admitted 6/15 for CHF exacerbation, uncontrolled HTN and questionable pneumonia although asymptomatic and no antibiotics given.   Vitals: afebrile, bp: 244/164, HR; 99, RR: 18, oxygen:100%RA Pertinent labs: BUN; 41, creatinine: 3.77 CXR: increasing right upper lobe airspace disease CT chest: 1. Extensive patchy airspace disease throughout the right lung. Findings are compatible with multifocal pneumonia. 2. Moderate-sized left pleural effusion with compressive atelectasis in the left lower lobe. 3. Multiple pulmonary nodules. Most significant: Right solid pulmonary nodule measuring 6 mm. Per Fleischner Society Guidelines, recommend a non-contrast Chest CT at 3-6 months, then consider another non-contrast Chest CT at 18-24 months. If patient is low risk for malignancy, non-contrast Chest CT at 18-24 months is optional. These guidelines do not apply to immunocompromised patients and patients with cancer. Follow up in patients with significant comorbidities as clinically warranted. For lung cancer screening, adhere to Lung-RADS guidelines. Reference: Radiology. 2017; 284(1):228-43. 4. Air-fluid collection in the upper mediastinum near the thoracic inlet. 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 5. Slightly enlarged mediastinal lymph nodes. 6. Question gastric wall thickening. Consider further evaluation of the abdomen with a CT with IV contrast. 7. Sclerotic area  involving the left ninth rib. This is nonspecific. No other suspicious osseous lesions. Consider correlating with PSA level. 8. Prominent main pulmonary artery. Findings can be associated with pulmonary arterial hypertension.  In ED: started on vanc and cefepime. Given 80mg  of lasix, hydralazine and labetalol.   HTN urgency, responded well to his home meds, ? Compliance issues.   Plan of care: The patient is accepted for admission to Telemetry unit, at [facility].. BNP pending    Author: Orland Mustard, MD 10/28/2022  Check [email or web address] for on-call coverage.  Nursing staff, Please call [facility] Admits & Consults System-Wide number on Amion as soon as patient's arrival, so appropriate admitting provider can evaluate the pt.

## 2022-10-28 NOTE — ED Notes (Signed)
I have attempted x 2 to call 5 [address] at [facility]; and I have attempted one call to their charge number with no answer. I will attempt again shortly. Pt. Has just left with [facility].

## 2022-10-28 NOTE — Assessment & Plan Note (Signed)
Baseline creatinine 3.5

## 2022-10-28 NOTE — ED Notes (Signed)
Report called to [facility], RN on [address].

## 2022-10-28 NOTE — Progress Notes (Addendum)
ED Pharmacy Antibiotic Sign Off An antibiotic consult was received from an ED provider for Vancomycin per pharmacy dosing for pneumonia. A chart review was completed to assess appropriateness.   MD ordered Cefepime 1g IV every 12 hours.   The following one time order(s) were placed:  Vancomycin 1750mg  IV x1.   Further antibiotic and/or antibiotic pharmacy consults should be ordered by the admitting provider if indicated.   Thank you for allowing pharmacy to be a part of this patient's care.   Fayne Norrie, [facility]  Clinical Pharmacist 10/28/22 2:55 PM

## 2022-10-28 NOTE — Progress Notes (Signed)
PHARMACY NOTE:  ANTIMICROBIAL RENAL DOSAGE ADJUSTMENT  Current antimicrobial regimen includes a mismatch between antimicrobial dosage and estimated renal function.  As per policy approved by the Pharmacy & Therapeutics and Medical Executive Committees, the antimicrobial dosage will be adjusted accordingly.  Current antimicrobial dosage:  cefepime 1 g IV q12h  Indication: PNA  Renal Function:  Estimated Creatinine Clearance: 29.2 mL/min (A) (by C-G formula based on SCr of 3.77 mg/dL (H)). []      On intermittent HD, scheduled: []      On CRRT    Antimicrobial dosage has been changed to:  cefepime 2 g IV q24h  Additional comments:   Thank you for involving pharmacy in this patient's care.  Loura Back, PharmD, BCPS Clinical Pharmacist Clinical phone for 10/28/2022 is [phone] 10/28/2022 4:25 PM

## 2022-10-28 NOTE — ED Notes (Signed)
[facility] called for transport @15 :05

## 2022-10-28 NOTE — ED Notes (Signed)
He has voided several times since her received Lasix. He tells me he is "breathing better now".

## 2022-10-28 NOTE — H&P (Cosign Needed Addendum)
Date: 10/28/2022               Patient Name:  Mark Hayden MRN: [id number]  DOB: 1975/12/01 Age / Sex: 47 y.o., male   PCP: Lovie Macadamia, MD         Medical Service: Internal Medicine Teaching Service         Attending Physician: Dr. Orland Mustard, MD      First Contact: Dr. Annett Fabian, MD Pager [phone]    Second Contact: Dr. Elza Rafter, DO Pager [phone]         After Hours (After 5p/  First Contact Pager: [phone]  weekends / holidays): Second Contact Pager: [phone]   SUBJECTIVE   Chief Complaint: cough  History of Present Illness: Mark Hayden is a 47 yo male with CHF EF 45 to 50%, CKD IV, HTN who presents with 2 days of cough with clear sputum production, streaks of blood and shortness of breath, admitted for heart failure exacerbation.   Patient reports he woke up this morning around 9 AM, had a coughing episode where he had large amount of sputum production, clear with streaks of blood.  Patient reports 2-day history of cough, chest congestion, shortness of breath and fatigue. Patient reports sleeping with 4 pillows, but normally uses two. He notes cough that is worse when laying flat. He does not weigh himself daily, therefore he does not know if he has more swelling than usual. Denies any fevers, chills, sore throat, nasal congestion. Denies any recent sick contacts, but works in [employer] and is unsure if others have been sick. Patient reports not taking his medications consistently. He has not taken his medications in the past two weeks, aside from taking it this past Thursday because he noted he was feeling short of breath and fatigued. Patient reports chest pain and sub-coastal pain during coughing fits, but not otherwise. Patient denies any abdominal pain, nausea or vomiting, diarrhea. Patient denies any dysuria.   Meds:  Carvedilol 25 mg BID Ferrous sulfate 325 mg daily Sodium bicarb 650 mg BID Kclor 10 mEq BID Hydralazine 100 mg  TID Lasix 40 mg daily - out x 3 weeks Imdur 90 mg daily   Past Medical History CHF with EF 45 to 50% Hypertension CKD stage 3  History reviewed. No pertinent surgical history.  Social:  Lives in [place], lives alone Occupation: works in [employer], [employer]. Support: Sister; also has a one month old grandson Level of Function: Fully independent of ADLs, iADLs PCP: [facility] Substances: Quit smoking cigarettes 6-7 years ago, vapes marijuana almost daily; no alcohol.    Family History:  Mother had ESRD, HTN, DM Father has DM Sister has lupus  Allergies: Allergies as of 10/28/2022 - Review Complete 10/28/2022  Allergen Reaction Noted   Shellfish allergy Anaphylaxis and Itching 09/22/2013    Review of Systems: A complete ROS was negative except as per HPI.   OBJECTIVE:   Physical Exam: Blood pressure (!) 171/109, pulse 90, temperature 98 F (36.7 C), temperature source Oral, resp. rate 16, height 5\' 11"  (1.803 m), weight 95.4 kg, SpO2 99%.  Constitutional: well-appearing, laying comfortably in bed, in no acute distress HENT: normocephalic atraumatic, mucous membranes moist Eyes: conjunctiva non-erythematous Cardiovascular: regular rate and rhythm, no m/r/g Pulmonary/Chest: normal work of breathing on room air.  Abdominal: soft, non-tender, non-distended, bowel sounds present  MSK: normal bulk and tone. No lower extremity edema bilaterally.  2+ DP pulses intact Neurological: alert & oriented x 3, 5/5 strength in  bilateral upper and lower extremities Skin: warm and dry Psych: normal mood and affect  Labs: CBC    Component Value Date/Time   WBC 8.7 10/28/2022 0936   RBC 3.88 (L) 10/28/2022 0936   HGB 11.9 (L) 10/28/2022 0936   HCT 35.0 (L) 10/28/2022 0936   PLT 184 10/28/2022 0936   MCV 90.2 10/28/2022 0936   MCH 30.7 10/28/2022 0936   MCHC 34.0 10/28/2022 0936   RDW 13.0 10/28/2022 0936   LYMPHSABS 0.9 10/28/2022 0936   MONOABS 0.5 10/28/2022 0936    EOSABS 0.3 10/28/2022 0936   BASOSABS 0.1 10/28/2022 0936     CMP     Component Value Date/Time   NA 138 10/28/2022 0936   K 4.5 10/28/2022 0936   CL 110 10/28/2022 0936   CO2 18 (L) 10/28/2022 0936   GLUCOSE 101 (H) 10/28/2022 0936   BUN 41 (H) 10/28/2022 0936   CREATININE 3.77 (H) 10/28/2022 0936   CALCIUM 8.7 (L) 10/28/2022 0936   PROT 7.5 10/28/2022 0936   ALBUMIN 4.2 10/28/2022 0936   AST 24 10/28/2022 0936   ALT 15 10/28/2022 0936   ALKPHOS 90 10/28/2022 0936   BILITOT 0.5 10/28/2022 0936   GFRNONAA 19 (L) 10/28/2022 0936   GFRAA >60 09/09/2019 0624    Imaging:  CXR 10/28/2022 IMPRESSION: Increasing right upper lobe airspace disease  CT Chest wo contrast IMPRESSION: 1. Extensive patchy airspace disease throughout the right lung. Findings are compatible with multifocal pneumonia. 2. Moderate-sized left pleural effusion with compressive atelectasis in the left lower lobe. 3. Multiple pulmonary nodules. Most significant: Right solid pulmonary nodule measuring 6 mm. Per Fleischner Society Guidelines, recommend a non-contrast Chest CT at 3-6 months, then consider another non-contrast Chest CT at 18-24 months. If patient is low risk for malignancy, non-contrast Chest CT at 18-24 months is optional. 4. Air-fluid collection in the upper mediastinum near the thoracic inlet. 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 5. Slightly enlarged mediastinal lymph nodes. 6. Question gastric wall thickening. Consider further evaluation of the abdomen with a CT with IV contrast. 7. Sclerotic area involving the left ninth rib. This is nonspecific. No other suspicious osseous lesions. Consider correlating with PSA level. 8. Prominent main pulmonary artery. Findings can be associated with pulmonary arterial hypertension.   EKG: Pending.   ASSESSMENT & PLAN:   Assessment & Plan by Problem: Principal Problem:   Multifocal pneumonia Active  Problems:   Hypertensive urgency   CKD (chronic kidney disease) stage 4, GFR 15-29 ml/min (HCC)   [name] is a 47 y.o. person living with a history of CHF w/ EF 45-50%, CKD IV, HTN presented with symptoms of 2 days of wet cough with clear sputum production and streaks of blood, shortness of breath admitted for CHF exacerbation (2/2) to multifocal pneumonia and medication non compliance.   #Acute on chronic CHF Presented with shortness of breath, orthopnea, and LE swelling, with associated symptoms of 2 days of worsening cough and clear sputum production, chest congestion. BNP elevated to 505.4. Patient denies any sick contacts though he works in [employer] with multiple of people. He has not been taking his medications for the past two weeks, aside for Thursday for feeling short or breath and fatigued. In ED, patient received IV lasix 80 mg in the ED and is feeling improved compared to this morning.  - Continue home medications Coreg 25 mg BID. GDMT limited due to CKD   - Daily  weights  - Strict Is Os  #Secondary to Multifocal pneumonia   #Moderate size left pleural effusion #CAP Presenting with 2 days of worsening cough with clear sputum production and chest congestion.  Patient denies any subjective fevers, nasal congestion, sore throat. Patient denies any sick contacts. Differentials included pneumonia vs URI vs worsening HF. Covid and respiratory panel pending. Patient was given IV cefepime 2 g and IV vancomycin 1,750 mg in ED. Overall, patient is on RA not requiring oxygen, afebrile, with no leukocytosis, appears to be doing well. Patient reports shortness of breath has improved. - START Ceftriaxone and Zithromax  - Follow up on Covid panel and respiratory panel   #Severe Asymptomatic Hypertension Patient presented with initial BP 244/164 not hypoxic, without any symptoms of chest pain or headaches. Patient reports not taking his medications for the past two weeks. Patient  was given one dose IV labetalol 20 mg and Hydralazine PO 100 mg in ED. BP still elevated. Patient is restarted back on home medication Coreg 25 mg BID. Will continue to closely monitor blood pressure readings.    - START Hydralazine 100mg  TID and continue Coreg and Imdur    Chronic Normocytic Anemia Presented with Hgb 11.9, prior Hgb in the range of 11, likely at baseline. No acute signs and symptoms of hemoptysis or hematochezia. No formal iron studies or ferritin studies per chart review, will order. - Follow up on Ferritin and Iron studies - Monitor Hgb   CKD Stage IV Baseline Cr 3.5, currently Cr 3.77, GFR 19. Will trend Cr.  Multiple pulmonary nodules Right solid pulmonary nodule measuring 6 mm Slightly enlarged mediastinal lymph nodes Per radiology, recommend a non-contrast Chest CT at 3-6 months and then consider another non-contrast Chest CT at 18-24 months. Patient is advised to follow up outpatient with pulmonologist for further work up   large Zenker diverticulum Suspected gastric wall thickening Noted on imaging. Patient advised to follow up outpatient for further work up.    Diet: Heart Healthy VTE: Enoxaparin IVF: None Code: Full  Prior to Admission Living Arrangement: Home, living self Anticipated Discharge Location: Home Barriers to Discharge: Medical Management   Dispo: Admit patient to Observation with expected length of stay less than 2 midnights.  Signed: Chauncey Mann, DO Internal Medicine Resident PGY-3  10/28/2022, 4:31 PM

## 2022-10-28 NOTE — ED Provider Notes (Addendum)
[place] EMERGENCY DEPARTMENT AT [facility] Provider Note   CSN: [id number] Arrival date & time: 10/28/22  0913     History  Chief Complaint  Patient presents with   Hemoptysis    Mark Hayden is a 47 y.o. male.  HPI    Pt comes in with cc of bloody phlegm with cough. Cough for 2 days, initially dry, now bloody. Shob present, but with exertion. Pt currently has no chest pain. Admits to non compliance to his meds. ROS is no n/v/f/c.  Pt has hx of chf, htn.  He was admitted to the hospital earlier this summer for community-acquired pneumonia.  Prior to that he was admitted to the hospital for hypertensive emergency, CHF.   Home Medications Prior to Admission medications   Medication Sig Start Date End Date Taking? Authorizing Provider  Blood Pressure Monitor DEVI Use to check blood pressure daily 09/10/22   Steffanie Rainwater, MD  carvedilol (COREG) 25 MG tablet Take 1 tablet (25 mg total) by mouth 2 (two) times daily. 09/10/22 09/10/23  Steffanie Rainwater, MD  ferrous sulfate 325 (65 FE) MG tablet Take 1 tablet (325 mg total) by mouth daily with breakfast. 09/03/22   Ghimire, Werner Lean, MD  furosemide (LASIX) 40 MG tablet Take 1 tablet (40 mg total) by mouth daily. 09/03/22 10/03/22  Ghimire, Werner Lean, MD  hydrALAZINE (APRESOLINE) 100 MG tablet Take 1 tablet (100 mg total) by mouth 3 (three) times daily. Patient taking differently: Take 100 mg by mouth 3 (three) times daily. Has only had 1 dose so far today 09/03/22 10/03/22  Ghimire, Werner Lean, MD  isosorbide mononitrate (IMDUR) 60 MG 24 hr tablet Take 1.5 tablets (90 mg total) by mouth daily. 09/03/22 10/03/22  Ghimire, Werner Lean, MD  potassium chloride (KLOR-CON) 10 MEQ tablet Take 1 tablet (10 mEq total) by mouth daily. 09/05/22 12/04/22  Andrey Farmer, PA-C  sodium bicarbonate 650 MG tablet Take 1 tablet (650 mg total) by mouth 2 (two) times daily. 09/03/22   Ghimire, Werner Lean, MD      Allergies     Shellfish allergy    Review of Systems   Review of Systems  All other systems reviewed and are negative.   Physical Exam Updated Vital Signs BP (!) 165/97   Pulse 86   Temp 99.1 F (37.3 C) (Temporal)   Resp 16   Ht 5\' 11"  (1.803 m)   Wt 95.4 kg   SpO2 100%   BMI 29.33 kg/m  Physical Exam Vitals and nursing note reviewed.  Constitutional:      Appearance: He is well-developed.  HENT:     Head: Atraumatic.  Cardiovascular:     Rate and Rhythm: Normal rate.  Pulmonary:     Effort: Pulmonary effort is normal.     Breath sounds: Wheezing and rhonchi present.  Musculoskeletal:     Cervical back: Neck supple.  Skin:    General: Skin is warm.  Neurological:     Mental Status: He is alert and oriented to person, place, and time.     ED Results / Procedures / Treatments   Labs (all labs ordered are listed, but only abnormal results are displayed) Labs Reviewed  CBC WITH DIFFERENTIAL/PLATELET - Abnormal; Notable for the following components:      Result Value   RBC 3.88 (*)    Hemoglobin 11.9 (*)    HCT 35.0 (*)    All other components within normal limits  COMPREHENSIVE METABOLIC PANEL -  Abnormal; Notable for the following components:   CO2 18 (*)    Glucose, Bld 101 (*)    BUN 41 (*)    Creatinine, Ser 3.77 (*)    Calcium 8.7 (*)    GFR, Estimated 19 (*)    All other components within normal limits  BRAIN NATRIURETIC PEPTIDE    EKG None  Radiology CT Chest Wo Contrast  Result Date: 10/28/2022 CLINICAL DATA:  Pneumonia, complicated suspected. Complaining of hemoptysis. EXAM: CT CHEST WITHOUT CONTRAST TECHNIQUE: Multidetector CT imaging of the chest was performed following the standard protocol without IV contrast. RADIATION DOSE REDUCTION: This exam was performed according to the departmental dose-optimization program which includes automated exposure control, adjustment of the mA and/or kV according to patient size and/or use of iterative reconstruction  technique. COMPARISON:  Chest radiograph 10/28/2022 FINDINGS: Cardiovascular: Main pulmonary artery is prominent for size measuring up to 3.7 cm. Ascending thoracic aorta measures approximately 3.2 cm. Heart size is within normal limits. No significant pericardial effusion. Minimal atherosclerotic calcifications in the posterior aortic arch. Mediastinum/Nodes: Air-fluid collection in the upper mediastinum near the thoracic inlet. This collection is left of the trachea and esophagus. This gas-filled structure measures 2.9 x 1.5 x 2.5 cm. This collection has some layering fluid or debris and may represent a large Zenker diverticulum. Slightly enlarged mediastinal lymph nodes. Precarinal lymph node measures 1.4 cm in the short axis on image 57/2. Multiple small prevascular lymph nodes. Limited evaluation for hilar lymph node enlargement without intravascular contrast. No axillary lymph node enlargement. Thyroid tissue is unremarkable. Lungs/Pleura: Moderate-sized left pleural effusion. Trace right pleural fluid versus pleural thickening. Small amount of compressive atelectasis in left lower lobe. Extensive patchy airspace disease throughout the right upper lobe, right lower lobe and right middle lobe. Numerous subpleural nodules in both lungs. Index subpleural nodule is in the right lower lobe on image 85/4 and measures up to 6 mm. In addition, there is a intraparenchymal nodule in the left upper lobe measuring 4 mm on image 76/4. Upper Abdomen: Question asymmetric gastric wall thickening on image 156/2. Musculoskeletal: Sclerosis involving the left ninth rib on image 127/4. no other suspicious osseous findings. IMPRESSION: 1. Extensive patchy airspace disease throughout the right lung. Findings are compatible with multifocal pneumonia. 2. Moderate-sized left pleural effusion with compressive atelectasis in the left lower lobe. 3. Multiple pulmonary nodules. Most significant: Right solid pulmonary nodule measuring 6  mm. Per Fleischner Society Guidelines, recommend a non-contrast Chest CT at 3-6 months, then consider another non-contrast Chest CT at 18-24 months. If patient is low risk for malignancy, non-contrast Chest CT at 18-24 months is optional. These guidelines do not apply to immunocompromised patients and patients with cancer. Follow up in patients with significant comorbidities as clinically warranted. For lung cancer screening, adhere to Lung-RADS guidelines. Reference: Radiology. 2017; 284(1):228-43. 4. Air-fluid collection in the upper mediastinum near the thoracic inlet. 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 5. Slightly enlarged mediastinal lymph nodes. 6. Question gastric wall thickening. Consider further evaluation of the abdomen with a CT with IV contrast. 7. Sclerotic area involving the left ninth rib. This is nonspecific. No other suspicious osseous lesions. Consider correlating with PSA level. 8. Prominent main pulmonary artery. Findings can be associated with pulmonary arterial hypertension. Electronically Signed   By: Richarda Overlie M.D.   On: 10/28/2022 12:16   DG Chest 2 View  Result Date: 10/28/2022 CLINICAL DATA:  hemoptysis EXAM: CHEST - 2 VIEW  COMPARISON:  09/01/2022 FINDINGS: Patchy airspace opacities in the right upper lung are more conspicuous than on prior exam. Left lower lobe airspace disease improved with some residual infrahilar opacities. Heart size and mediastinal contours are within normal limits. No effusion. Visualized bones unremarkable. IMPRESSION: Increasing right upper lobe airspace disease. Electronically Signed   By: Corlis Leak M.D.   On: 10/28/2022 09:52    Procedures .Critical Care  Performed by: Derwood Kaplan, MD Authorized by: Derwood Kaplan, MD   Critical care provider statement:    Critical care time (minutes):  48   Critical care was necessary to treat or prevent imminent or life-threatening deterioration of  the following conditions:  Circulatory failure   Critical care was time spent personally by me on the following activities:  Development of treatment plan with patient or surrogate, discussions with consultants, evaluation of patient's response to treatment, examination of patient, ordering and review of laboratory studies, ordering and review of radiographic studies, ordering and performing treatments and interventions, pulse oximetry, re-evaluation of patient's condition and review of old charts     Medications Ordered in ED Medications  carvedilol (COREG) tablet 25 mg (has no administration in time range)  isosorbide mononitrate (IMDUR) 24 hr tablet 90 mg (90 mg Oral Given 10/28/22 1137)  ceFEPIme (MAXIPIME) 1 g in sodium chloride 0.9 % 100 mL IVPB (has no administration in time range)  labetalol (NORMODYNE) injection 20 mg (20 mg Intravenous Given 10/28/22 1139)  furosemide (LASIX) injection 80 mg (80 mg Intravenous Given 10/28/22 1138)  hydrALAZINE (APRESOLINE) tablet 100 mg (100 mg Oral Given 10/28/22 1138)    ED Course/ Medical Decision Making/ A&P Clinical Course as of 10/28/22 1420  Sun Oct 28, 2022  1419 BP(!): 165/97 Patient's BP has improved significantly with his oral meds and IV labetalol. [AN]    Clinical Course User Index [AN] Derwood Kaplan, MD                                 Medical Decision Making Amount and/or Complexity of Data Reviewed Labs: ordered. Radiology: ordered.  Risk Prescription drug management. Decision regarding hospitalization.   This patient presents to the ED with chief complaint(s) of shortness of breath with exertion, cough that is now leading to hemoptysis with pertinent past medical history of CKD, CHF, poorly controlled hypertension and recent admission for pneumonia and acute CHF.The complaint involves an extensive differential diagnosis and also carries with it a high risk of complications and morbidity.    The differential diagnosis  includes : Pleural effusion, pulmonary edema, bronchitis, COVID-19, multifocal pneumonia, PE, pulmonary tumor  Patient also noted to be profoundly hypertensive.  At this time is not having any headache, chest pain, shortness of breath at rest.  The initial plan is to give patient IV labetalol along with his oral antibiotics.  We will get x-ray of the chest along with basic labs.   Additional history obtained:  Records reviewed previous admission documents and previous cardiology note including cardiac workup  Independent labs interpretation:  The following labs were independently interpreted: CBC and BMP are reassuring.  BMP shows baseline chronic kidney disease.  Independent visualization and interpretation of imaging: - I independently visualized the following imaging with scope of interpretation limited to determining acute life threatening conditions related to emergency care: X-ray of the chest, which revealed no concerning findings.  Particularly there is question about right sided pneumonia, which could be remnant of  the previous infection.  Treatment and Reassessment: CT chest without contrast has been added. Patient admits to not taking his BP medication, therefore is received IV labetalol along with his home meds.  2:20 PM CT chest without contrast shows multifocal pneumonia. Patient is not compliant with his medication, has high comorbidity index and also recent admission to the hospital where he received IV antibiotics.  It would be best to admit him to the hospital for pneumonia and give him vancomycin and cefepime.  Patient does not clinically have sepsis.    Final Clinical Impression(s) / ED Diagnoses Final diagnoses:  Multifocal pneumonia  Hypertensive urgency    Rx / DC Orders ED Discharge Orders     None         Derwood Kaplan, MD 10/28/22 1419    Derwood Kaplan, MD 10/28/22 1420

## 2022-10-28 NOTE — ED Triage Notes (Signed)
Pt arrives to ED with c/o hemoptysis that started this morning.

## 2022-10-29 ENCOUNTER — Observation Stay (HOSPITAL_COMMUNITY): Payer: Commercial Managed Care - HMO

## 2022-10-29 ENCOUNTER — Telehealth: Payer: Self-pay | Admitting: Internal Medicine

## 2022-10-29 ENCOUNTER — Inpatient Hospital Stay (HOSPITAL_COMMUNITY): Payer: Commercial Managed Care - HMO

## 2022-10-29 DIAGNOSIS — I160 Hypertensive urgency: Secondary | ICD-10-CM | POA: Diagnosis not present

## 2022-10-29 DIAGNOSIS — N179 Acute kidney failure, unspecified: Secondary | ICD-10-CM | POA: Diagnosis not present

## 2022-10-29 DIAGNOSIS — Z8269 Family history of other diseases of the musculoskeletal system and connective tissue: Secondary | ICD-10-CM | POA: Diagnosis not present

## 2022-10-29 DIAGNOSIS — Z8249 Family history of ischemic heart disease and other diseases of the circulatory system: Secondary | ICD-10-CM | POA: Diagnosis not present

## 2022-10-29 DIAGNOSIS — J918 Pleural effusion in other conditions classified elsewhere: Secondary | ICD-10-CM | POA: Diagnosis not present

## 2022-10-29 DIAGNOSIS — Z91128 Patient's intentional underdosing of medication regimen for other reason: Secondary | ICD-10-CM | POA: Diagnosis not present

## 2022-10-29 DIAGNOSIS — J181 Lobar pneumonia, unspecified organism: Secondary | ICD-10-CM

## 2022-10-29 DIAGNOSIS — R590 Localized enlarged lymph nodes: Secondary | ICD-10-CM | POA: Diagnosis present

## 2022-10-29 DIAGNOSIS — N184 Chronic kidney disease, stage 4 (severe): Secondary | ICD-10-CM | POA: Diagnosis not present

## 2022-10-29 DIAGNOSIS — J90 Pleural effusion, not elsewhere classified: Secondary | ICD-10-CM | POA: Diagnosis not present

## 2022-10-29 DIAGNOSIS — Z91013 Allergy to seafood: Secondary | ICD-10-CM | POA: Diagnosis not present

## 2022-10-29 DIAGNOSIS — Z79899 Other long term (current) drug therapy: Secondary | ICD-10-CM | POA: Diagnosis not present

## 2022-10-29 DIAGNOSIS — R0981 Nasal congestion: Secondary | ICD-10-CM | POA: Diagnosis present

## 2022-10-29 DIAGNOSIS — R918 Other nonspecific abnormal finding of lung field: Secondary | ICD-10-CM | POA: Diagnosis present

## 2022-10-29 DIAGNOSIS — I5023 Acute on chronic systolic (congestive) heart failure: Secondary | ICD-10-CM | POA: Diagnosis not present

## 2022-10-29 DIAGNOSIS — Y838 Other surgical procedures as the cause of abnormal reaction of the patient, or of later complication, without mention of misadventure at the time of the procedure: Secondary | ICD-10-CM | POA: Diagnosis not present

## 2022-10-29 DIAGNOSIS — Z1152 Encounter for screening for COVID-19: Secondary | ICD-10-CM | POA: Diagnosis not present

## 2022-10-29 DIAGNOSIS — I130 Hypertensive heart and chronic kidney disease with heart failure and stage 1 through stage 4 chronic kidney disease, or unspecified chronic kidney disease: Secondary | ICD-10-CM | POA: Diagnosis not present

## 2022-10-29 DIAGNOSIS — Z833 Family history of diabetes mellitus: Secondary | ICD-10-CM | POA: Diagnosis not present

## 2022-10-29 DIAGNOSIS — Z87891 Personal history of nicotine dependence: Secondary | ICD-10-CM | POA: Diagnosis not present

## 2022-10-29 DIAGNOSIS — K225 Diverticulum of esophagus, acquired: Secondary | ICD-10-CM | POA: Diagnosis present

## 2022-10-29 DIAGNOSIS — J189 Pneumonia, unspecified organism: Secondary | ICD-10-CM

## 2022-10-29 DIAGNOSIS — D631 Anemia in chronic kidney disease: Secondary | ICD-10-CM | POA: Diagnosis not present

## 2022-10-29 DIAGNOSIS — J95811 Postprocedural pneumothorax: Secondary | ICD-10-CM | POA: Diagnosis not present

## 2022-10-29 DIAGNOSIS — J939 Pneumothorax, unspecified: Secondary | ICD-10-CM | POA: Diagnosis not present

## 2022-10-29 LAB — HEPATIC FUNCTION PANEL
ALT: 17 U/L (ref 0–44)
AST: 17 U/L (ref 15–41)
Albumin: 3.2 g/dL — ABNORMAL LOW (ref 3.5–5.0)
Alkaline Phosphatase: 76 U/L (ref 38–126)
Bilirubin, Direct: <0.1 mg/dL (ref 0.0–0.2)
Total Bilirubin: 0.6 mg/dL (ref 0.3–1.2)
Total Protein: 6.4 g/dL — ABNORMAL LOW (ref 6.5–8.1)

## 2022-10-29 LAB — BODY FLUID CELL COUNT WITH DIFFERENTIAL
Eos, Fluid: 0 %
Lymphs, Fluid: 81 %
Monocyte-Macrophage-Serous Fluid: 6 % — ABNORMAL LOW (ref 50–90)
Neutrophil Count, Fluid: 13 % (ref 0–25)
Total Nucleated Cell Count, Fluid: 74 cu mm (ref 0–1000)

## 2022-10-29 LAB — PROTEIN, PLEURAL OR PERITONEAL FLUID: Total protein, fluid: <3 g/dL

## 2022-10-29 LAB — GLUCOSE, PLEURAL OR PERITONEAL FLUID: Glucose, Fluid: 120 mg/dL

## 2022-10-29 LAB — LACTATE DEHYDROGENASE, PLEURAL OR PERITONEAL FLUID: LD, Fluid: 48 U/L — ABNORMAL HIGH (ref 3–23)

## 2022-10-29 LAB — MAGNESIUM: Magnesium: 1.6 mg/dL — ABNORMAL LOW (ref 1.7–2.4)

## 2022-10-29 LAB — LACTATE DEHYDROGENASE: LDH: 208 U/L — ABNORMAL HIGH (ref 98–192)

## 2022-10-29 MED ORDER — MAGNESIUM SULFATE 2 GM/50ML IV SOLN
2.0000 g | Freq: Once | INTRAVENOUS | Status: AC
  Administered 2022-10-29: 2 g via INTRAVENOUS
  Filled 2022-10-29: qty 50

## 2022-10-29 MED ORDER — POTASSIUM CHLORIDE CRYS ER 20 MEQ PO TBCR
40.0000 meq | EXTENDED_RELEASE_TABLET | Freq: Once | ORAL | Status: AC
  Administered 2022-10-29: 40 meq via ORAL
  Filled 2022-10-29: qty 2

## 2022-10-29 MED ORDER — DIPHENHYDRAMINE HCL 25 MG PO CAPS: 25.0000 mg | ORAL_CAPSULE | Freq: Four times a day (QID) | ORAL | Status: DC | PRN

## 2022-10-29 MED ORDER — FUROSEMIDE 40 MG PO TABS
40.0000 mg | ORAL_TABLET | Freq: Every day | ORAL | Status: DC
  Administered 2022-10-29: 40 mg via ORAL
  Filled 2022-10-29: qty 1

## 2022-10-29 NOTE — Progress Notes (Signed)
HD#0 SUBJECTIVE:  Patient Summary: Mark Hayden is a 47 y.o. 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   Overnight Events: None  Interim History: Patient was evaluated bedside this morning.  He says he is feeling well.  He reports some congestion last night, which woke him up, but says his cough has improved.  He has not noted increased swelling and denies any recent shortness of breath and chest pain.  OBJECTIVE:  Vital Signs: Vitals:   10/29/22 0001 10/29/22 0434 10/29/22 0810 10/29/22 1140  BP: (!) 157/101 (!) 191/132 (!) 154/105 (!) 143/106  Pulse: 87 83 96   Resp: 16 17 18 20   Temp:   98.3 F (36.8 C)   TempSrc:   Oral   SpO2: 99% 99% 95% 100%  Weight:      Height:       Supplemental O2: Room Air SpO2: 100 %  Filed Weights   10/28/22 0928  Weight: 95.4 kg     Intake/Output Summary (Last 24 hours) at 10/29/2022 1351 Last data filed at 10/29/2022 0400 Gross per 24 hour  Intake 156.44 ml  Output 760 ml  Net -603.56 ml   Net IO Since Admission: -603.56 mL [10/29/22 1351]  Physical Exam:  General: Laying in bed comfortably, no acute distress Cardiac: Regular rate and rhythm, no murmurs Pulmonary: Lungs clear to auscultation bilaterally, slightly decreased at left base; normal respiratory effort  Neuro: No focal deficits; alert and oriented Psych: Normal mood and affect  Patient Lines/Drains/Airways Status     Active Line/Drains/Airways     Name Placement date Placement time Site Days   Peripheral IV 10/28/22 20 G 1" Left;Posterior Hand 10/28/22  0935  Hand  1            Pertinent Labs:    Latest Ref Rng & Units 10/29/2022   12:19 AM 10/28/2022    9:36 AM 09/03/2022    3:46 AM  CBC  WBC 4.0 - 10.5 K/uL 6.8  8.7  6.3   Hemoglobin 13.0 - 17.0 g/dL 32.4  40.1  02.7   Hematocrit  39.0 - 52.0 % 31.2  35.0  35.5   Platelets 150 - 400 K/uL 163  184  243        Latest Ref Rng & Units 10/29/2022   12:19 AM 10/28/2022    9:36 AM 09/03/2022    3:46 AM  CMP  Glucose 70 - 99 mg/dL 99  253  91   BUN 6 - 20 mg/dL 40  41  33   Creatinine 0.61 - 1.24 mg/dL 6.64  4.03  4.74   Sodium 135 - 145 mmol/L 138  138  136   Potassium 3.5 - 5.1 mmol/L 3.3  4.5  3.6   Chloride 98 - 111 mmol/L 107  110  105   CO2 22 - 32 mmol/L 19  18  20    Calcium 8.9 - 10.3 mg/dL 7.7  8.7  7.7   Total Protein 6.5 - 8.1 g/dL  7.5    Total Bilirubin 0.3 - 1.2 mg/dL  0.5    Alkaline Phos 38 - 126 U/L  90    AST 15 - 41 U/L  24    ALT 0 - 44 U/L  15     Iron panel: Iron 26, TIBC 244, ferritin 64  ASSESSMENT/PLAN:  Assessment: Principal Problem:   Multifocal pneumonia Active Problems:   Hypertensive urgency   CKD (chronic kidney disease) stage 4, GFR 15-29 ml/min (HCC)   Acute exacerbation of CHF (congestive heart failure) (HCC)  Plan: Multifocal pneumonia, predominantly on the right Moderate pleural effusion on the left His respiratory symptoms seem to be improving on antibiotics.  He continues to report congestion, but says his shortness of breath and cough have improved.  He has remained afebrile and has not had any leukocytosis.  He continues to sat well on room air.  Respiratory viral panel + COVID was negative.  He has received 2 days of ceftriaxone 2 g and azithromycin for suspected community-acquired pneumonia.  Left-sided effusion and right-sided pneumonia is atypical, may be two separate processes.  Pulmonology consulted for thoracentesis, drained 600 cc amber fluid from the left pleural space. - F/u pleural cultures and cytology, post-procedure CXR - Continue ceftriaxone 2 g, azithromycin 500 mg; switch to oral Augmentin tomorrow for probable discharge - Outpatient pulm follow-up in 3-4 weeks; repeat non-contrast chest CT in 3-6 months and again at 18-24 months for pulmonary nodules per  radiology  Acute on chronic HFmrEF Severe hypertension Blood pressure elevated at 191/132 this morning, down from prior but still quite elevated.  He is not reporting symptoms secondary to his hypertension.  He had not been taking his medications for 2 weeks, so we will restart his home medications and continue to monitor his blood pressure.  He noted that the reason he stopped taking his medications recently was because he had significant cramping. Magnesium was 1.6, will replete.  - Replete Magnesium, morning BMP - Continue hydralazine 100 mg 3 times daily - Continue Coreg 25 mg twice daily, Imdur 90 mg daily  Best Practice: Diet: Cardiac diet IVF: None VTE: enoxaparin (LOVENOX) injection 30 mg Start: 10/28/22 1800 Code: Full AB: None DISPO: Anticipated discharge in 1-2 days to Home pending medical stability.   Signature: Annett Fabian, MD  Internal Medicine Resident, PGY-1 [facility]  Pager: [phone] 1:51 PM, 10/29/2022   Please contact the on call pager after 5 pm and on weekends at [phone].

## 2022-10-29 NOTE — Consult Note (Signed)
NAME:  Mark Hayden, MRN:  [id number], DOB:  [birth year], LOS: 0 ADMISSION DATE:  10/28/2022, CONSULTATION DATE:  8/12 REFERRING MD:  Dr. Heide Spark, CHIEF COMPLAINT:  Pleural effusion   History of Present Illness:  47 year old male with past medical history as below, which is significant for heart failure with reduced ejection fraction 40 to 45%, chronic kidney disease stage IV, and hypertension.  Had a recent admission for community-acquired pneumonia plus minus heart failure exacerbation.  He presented to [facility] on 8/11 with complaints of hemoptysis.  Onset of cough was 8/9 and started as a dry cough but became bloody.  Also having dyspnea with exertion.  He did admit to the ER physician that he had been noncompliant with his heart failure medications.  He was noted to be profoundly hypertensive which improved with IV labetalol.  Workup in the emergency department included CT of the chest which was concerning for multifocal pneumonia and moderate left-sided pleural effusion.  He was admitted to the hospitalist service for treatment of multifocal pneumonia with ceftriaxone and azithromycin, acute on chronic HFrEF treated with diuretics, and hypertension managed with intermittent dosing of IV and oral medications.  PCCM was consulted for evaluation of the left-sided pleural effusion and possible drainage.  Per patient having increased congestion and pleurisy since Wednesday.  Hemoptysis starting yesterday.  Vaping cannibis, former smoker x 20 years.  ROS as below.  Some weight loss but unable to specify.  No sick contacts, no unusual exposures.  Pertinent  Medical History   has a past medical history of CHF (congestive heart failure) (HCC), Hypertension, Hypertensive emergency (08/20/2022), and Suicidal thoughts (09/08/2019).   Significant Hospital Events: Including procedures, antibiotic start and stop dates in addition to other pertinent events   8/11 admitted for  pneumonia, pleural effusion, and CHF exacerbation  Interim History / Subjective:  Consult  Objective   Blood pressure (!) 154/105, pulse 96, temperature 98.3 F (36.8 C), temperature source Oral, resp. rate 18, height 5\' 11"  (1.803 m), weight 95.4 kg, SpO2 95%.        Intake/Output Summary (Last 24 hours) at 10/29/2022 1112 Last data filed at 10/29/2022 0400 Gross per 24 hour  Intake 156.44 ml  Output 760 ml  Net -603.56 ml   Filed Weights   10/28/22 0928  Weight: 95.4 kg    Examination: No distress No edema Moderate L effusion Poor insight but Aox3 Heart sounds regular, ext warm Moves to command, able to self-transfer MMM, trachea midline Moderate simple L effusion on [place], R costophrenic angle clear  Labs/imaging reviewed.  Resolved Hospital Problem list     Assessment & Plan:   Left sided moderate sized pleural effusion Acute on chronic HFrEF (LVEF 45-50% from 08/18/22) Multifocal pneumonia vs pulmonary edema CKD stage IV: baseline creatinine 3.5 Multiple pulmonary nodules with enlarged mediastinal nodes.  Large Zenker diverticulum Hypertensive urgency  -No obvious aspiration symptoms. -Inflammatory vs. Infectious vs. Malignant; hx of smoking; fluid has been there at least a month looking at prior imaging; interesting that the pneumonitis (GGO/nodules in bronchovascular distribution) are contralateral to fluid; this could be two separate processes going on; -Agree with at least 2 weeks of antibiotics; augmentin may be good choice on DC -Tap and send for usual studies -RE: abnormal CT, will need f/u in office in 3-4 weeks, will arrange with Dr. Tonia Brooms -Will call him sooner if fluid cyto is concerning -Otherwise available PRN  Best Practice (right click and "Reselect all SmartList Selections" daily)  Per  primary  Labs   CBC: Recent Labs  Lab 10/28/22 0936 10/29/22 0019  WBC 8.7 6.8  NEUTROABS 7.0  --   HGB 11.9* 10.3*  HCT 35.0* 31.2*  MCV 90.2 91.8   PLT 184 163    Basic Metabolic Panel: Recent Labs  Lab 10/28/22 0936 10/29/22 0019  NA 138 138  K 4.5 3.3*  CL 110 107  CO2 18* 19*  GLUCOSE 101* 99  BUN 41* 40*  CREATININE 3.77* 3.88*  CALCIUM 8.7* 7.7*  MG  --  1.6*   GFR: Estimated Creatinine Clearance: 28.3 mL/min (A) (by C-G formula based on SCr of 3.88 mg/dL (H)). Recent Labs  Lab 10/28/22 0936 10/29/22 0019  WBC 8.7 6.8    Liver Function Tests: Recent Labs  Lab 10/28/22 0936  AST 24  ALT 15  ALKPHOS 90  BILITOT 0.5  PROT 7.5  ALBUMIN 4.2   No results for input(s): "LIPASE", "AMYLASE" in the last 168 hours. No results for input(s): "AMMONIA" in the last 168 hours.  ABG No results found for: "PHART", "PCO2ART", "PO2ART", "HCO3", "TCO2", "ACIDBASEDEF", "O2SAT"   Coagulation Profile: No results for input(s): "INR", "PROTIME" in the last 168 hours.  Cardiac Enzymes: No results for input(s): "CKTOTAL", "CKMB", "CKMBINDEX", "TROPONINI" in the last 168 hours.  HbA1C: Hgb A1c MFr Bld  Date/Time Value Ref Range Status  08/17/2022 05:02 PM 5.5 4.8 - 5.6 % Final    Comment:    (NOTE)         Prediabetes: 5.7 - 6.4         Diabetes: >6.4         Glycemic control for adults with diabetes: <7.0     CBG: No results for input(s): "GLUCAP" in the last 168 hours.  Review of Systems:    Positive Symptoms in bold:  Constitutional fevers, chills, weight loss, fatigue, anorexia, malaise  Eyes decreased vision, double vision, eye irritation  Ears, Nose, Mouth, Throat sore throat, trouble swallowing, sinus congestion  Cardiovascular chest pain, paroxysmal nocturnal dyspnea, lower ext edema, palpitations   Respiratory SOB, cough, DOE, hemoptysis, wheezing  Gastrointestinal nausea, vomiting, diarrhea  Genitourinary burning with urination, trouble urinating  Musculoskeletal joint aches, joint swelling, back pain  Integumentary  rashes, skin lesions  Neurological focal weakness, focal numbness, trouble  speaking, headaches  Psychiatric depression, anxiety, confusion  Endocrine polyuria, polydipsia, cold intolerance, heat intolerance  Hematologic abnormal bruising, abnormal bleeding, unexplained nose bleeds  Allergic/Immunologic recurrent infections, hives, swollen lymph nodes     Past Medical History:  He,  has a past medical history of CHF (congestive heart failure) (HCC), Hypertension, Hypertensive emergency (08/20/2022), and Suicidal thoughts (09/08/2019).   Surgical History:  History reviewed. No pertinent surgical history.   Social History:   reports that he has quit smoking. He has never used smokeless tobacco. He reports that he does not drink alcohol and does not use drugs.   Family History:  His family history includes Diabetes in his father and mother.   Allergies Allergies  Allergen Reactions   Shellfish Allergy Anaphylaxis and Itching    seafood     Home Medications  Prior to Admission medications   Medication Sig Start Date End Date Taking? Authorizing Provider  carvedilol (COREG) 25 MG tablet Take 1 tablet (25 mg total) by mouth 2 (two) times daily. 09/10/22 09/10/23 Yes Steffanie Rainwater, MD  ferrous sulfate 325 (65 FE) MG tablet Take 1 tablet (325 mg total) by mouth daily with breakfast. 09/03/22  Yes  Ghimire, Werner Lean, MD  hydrALAZINE (APRESOLINE) 100 MG tablet Take 1 tablet (100 mg total) by mouth 3 (three) times daily. 09/03/22 10/28/22 Yes Ghimire, Werner Lean, MD  isosorbide mononitrate (IMDUR) 60 MG 24 hr tablet Take 1.5 tablets (90 mg total) by mouth daily. 09/03/22 10/28/22 Yes Ghimire, Werner Lean, MD  potassium chloride (KLOR-CON) 10 MEQ tablet Take 1 tablet (10 mEq total) by mouth daily. 09/05/22 12/04/22 Yes Andrey Farmer, PA-C  sodium bicarbonate 650 MG tablet Take 1 tablet (650 mg total) by mouth 2 (two) times daily. 09/03/22  Yes Ghimire, Werner Lean, MD  Blood Pressure Monitor DEVI Use to check blood pressure daily 09/10/22   Steffanie Rainwater, MD   furosemide (LASIX) 40 MG tablet Take 1 tablet (40 mg total) by mouth daily. 09/03/22 10/03/22  GhimireWerner Lean, MD     Critical care time: N/A

## 2022-10-29 NOTE — Hospital Course (Signed)
Mark Hayden is a 47 y.o. 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  Multifocal pneumonia, predominantly on the right Moderate pleural effusion on the left s/p thoracentesis Multiple pulmonary nodules  He presented to the emergency department with worsening shortness of breath, cough, and clear sputum production for 2 days.  He also had orthopnea and fatigue.  Chest imaging demonstrated right-sided multifocal pneumonia, multiple pulmonary nodules, and a left-sided pleural effusion. Viral panel + COVID were negative. He was started on empiric ceftriaxone and azithromycin for suspected community-acquired pneumonia.  Pulmonology was consulted who did a thoracentesis and drained 600 cc of amber-colored fluid from the left lung. Pleural fluid culture and cytology were obtained, which demonstrated a transudative effusion.  Pleural fluid culture has had no growth after 24 hours.  A very small apical pneumothorax was noted following thoracentesis, which resolved quickly on its own. His respiratory symptoms resolved other than persistent nasal congestion. He was transitioned to a 14 day course of Augmentin for continued outpatient treatment of his pneumonia.  Pulmonology has scheduled an outpatient follow-up appointment in 3 to 4 weeks.  Acute on chronic HFmrEF Severe hypertension He presented with shortness of breath, orthopnea, and some lower extremity swelling.  BNP was elevated at 505.  He said he had not been taking his medications for the past 2 weeks.  It was thought that his respiratory symptoms were likely multifocal, perhaps a pneumonia superimposed on a heart failure exacerbation causing a transudative effusion.  In the emergency department, he received 80 mg of IV Lasix which seemed to improve his symptoms.  His initial blood  pressure was 244/164, but he was asymptomatic and had not been taking his hypertension medications.  He was given 1 dose of IV labetalol 20 mg and hydralazine 100 mg in the emergency department.  He was then restarted on his home Coreg and Imdur, as well as hydralazine 100 mg 3 times daily.  His blood pressure improved somewhat but remains elevated and will need to be managed more closely on an outpatient basis.  CKD Stage IV His baseline creatinine seems to be around 3.5-3.6.  His creatinine rose to 4.23 during his hospital stay, GFR 17.  Lasix was held in the setting of acute kidney injury.  Recommend close follow-up outpatient and establishing with a nephrologist.

## 2022-10-29 NOTE — Plan of Care (Signed)

## 2022-10-29 NOTE — Progress Notes (Signed)
Incomplete re-expansion on CXR Recheck CXR at 1700 and tomorrow AM Add some O2 No indication for chest tube at this time  Myrla Halsted MD PCCM

## 2022-10-29 NOTE — Plan of Care (Signed)
  Problem: Education: Goal: Knowledge of General Education information will improve Description: Including pain rating scale, medication(s)/side effects and non-pharmacologic comfort measures Outcome: Progressing   Problem: Clinical Measurements: Goal: Ability to maintain clinical measurements within normal limits will improve Outcome: Progressing   

## 2022-10-29 NOTE — Progress Notes (Signed)
Heart Failure Navigator Progress Note  Assessed for Heart & Vascular TOC clinic readiness.  Patient does not meet criteria due to Advanced Heart Failure Team patient.   Navigator will sign off at this time.    Dawn Fields, BSN, RN Heart Failure Nurse Navigator Secure Chat Only   

## 2022-10-29 NOTE — Procedures (Signed)
Thoracentesis  Procedure Note  Andrews Devan  [id number]  January 28, 1976  Date:10/29/22  Time:12:21 PM   Provider Performing: C Katrinka Blazing   Procedure: Thoracentesis with imaging guidance ([id number])  Indication(s) Pleural Effusion  Consent Risks of the procedure as well as the alternatives and risks of each were explained to the patient and/or caregiver.  Consent for the procedure was obtained and is signed in the bedside chart  Anesthesia Topical only with 1% lidocaine    Time Out Verified patient identification, verified procedure, site/side was marked, verified correct patient position, special equipment/implants available, medications/allergies/relevant history reviewed, required imaging and test results available.   Sterile Technique Maximal sterile technique including full sterile barrier drape, hand hygiene, sterile gown, sterile gloves, mask, hair covering, sterile ultrasound probe cover (if used).  Procedure Description Ultrasound was used to identify appropriate pleural anatomy for placement and overlying skin marked.  Area of drainage cleaned and draped in sterile fashion. Lidocaine was used to anesthetize the skin and subcutaneous tissue.  600 cc's of amber appearing fluid was drained from the left pleural space. Catheter then removed and bandaid applied to site.   Complications/Tolerance None; patient tolerated the procedure well. Chest X-ray is ordered to confirm no post-procedural complication.   EBL Minimal   Specimen(s) Pleural fluid

## 2022-10-30 ENCOUNTER — Other Ambulatory Visit: Payer: Self-pay | Admitting: Internal Medicine

## 2022-10-30 ENCOUNTER — Inpatient Hospital Stay (HOSPITAL_COMMUNITY): Payer: Commercial Managed Care - HMO

## 2022-10-30 ENCOUNTER — Other Ambulatory Visit (HOSPITAL_COMMUNITY): Payer: Self-pay

## 2022-10-30 DIAGNOSIS — Z87891 Personal history of nicotine dependence: Secondary | ICD-10-CM | POA: Diagnosis not present

## 2022-10-30 DIAGNOSIS — J181 Lobar pneumonia, unspecified organism: Secondary | ICD-10-CM | POA: Diagnosis not present

## 2022-10-30 DIAGNOSIS — J939 Pneumothorax, unspecified: Secondary | ICD-10-CM | POA: Diagnosis not present

## 2022-10-30 DIAGNOSIS — N184 Chronic kidney disease, stage 4 (severe): Secondary | ICD-10-CM

## 2022-10-30 MED ORDER — ISOSORBIDE MONONITRATE ER 60 MG PO TB24
90.0000 mg | ORAL_TABLET | Freq: Every day | ORAL | 0 refills | Status: DC
  Filled 2022-10-30: qty 60, 40d supply, fill #0

## 2022-10-30 MED ORDER — CARVEDILOL 25 MG PO TABS
25.0000 mg | ORAL_TABLET | Freq: Two times a day (BID) | ORAL | 0 refills | Status: DC
  Filled 2022-10-30: qty 60, 30d supply, fill #0

## 2022-10-30 MED ORDER — HYDRALAZINE HCL 100 MG PO TABS
100.0000 mg | ORAL_TABLET | Freq: Three times a day (TID) | ORAL | 0 refills | Status: DC
  Filled 2022-10-30: qty 90, 30d supply, fill #0

## 2022-10-30 MED ORDER — AMOXICILLIN-POT CLAVULANATE 500-125 MG PO TABS
1.0000 | ORAL_TABLET | Freq: Two times a day (BID) | ORAL | 0 refills | Status: AC
  Filled 2022-10-30: qty 28, 14d supply, fill #0

## 2022-10-30 NOTE — TOC CM/SW Note (Signed)
Transition of Care [facility]) - Inpatient Brief Assessment   Patient Details  Name: Mark Hayden MRN: [id number] Date of Birth: 1975/11/20  Transition of [facility]) CM/SW Contact:    Epifanio Lesches, RN Phone Number: 10/30/2022, 1:10 PM   Clinical Narrative: Presents with SOB/ PNA. Hx of  heart failure with mildly reduced ejection fraction (EF 45 to 50%), CKD stage IV, hypertension. From home alone, no DME usage.   Pt without RX med concerns or transportation issues.  TOC following and will assist with needs....  Transition of Care Asessment: Insurance and Status: Insurance coverage has been reviewed Patient has primary care physician: Yes Home environment has been reviewed: From home alone Prior level of function:: PTA independent with ADL's, no DME usage Prior/Current Home Services: No current home services Social Determinants of Health Reivew: SDOH reviewed no interventions necessary Readmission risk has been reviewed: No Transition of care needs: no transition of care needs at this time

## 2022-10-30 NOTE — Discharge Summary (Addendum)
Name: Coleston Sarff MRN: [id number] DOB: 02-19-1976 47 y.o. PCP: Lovie Macadamia, MD  Date of Admission: 10/28/2022 10:24 AM Date of Discharge:  10/30/2022 Attending Physician: Dr. Heide Spark  DISCHARGE DIAGNOSIS:  Primary Problem: Multifocal pneumonia   Hospital Problems: Principal Problem:   Multifocal pneumonia Active Problems:   Hypertensive urgency   CKD (chronic kidney disease) stage 4, GFR 15-29 ml/min (HCC)   Acute exacerbation of CHF (congestive heart failure) (HCC)    DISCHARGE MEDICATIONS:   Allergies as of 10/30/2022       Reactions   Shellfish Allergy Anaphylaxis, Itching   seafood        Medication List     STOP taking these medications    furosemide 40 MG tablet Commonly known as: LASIX       TAKE these medications    amoxicillin-clavulanate 500-125 MG tablet Commonly known as: Augmentin Take 1 tablet by mouth 2 (two) times daily for 14 days.   Blood Pressure Monitor Devi Use to check blood pressure daily   carvedilol 25 MG tablet Commonly known as: Coreg Take 1 tablet (25 mg total) by mouth 2 (two) times daily.   FeroSul 325 (65 Fe) MG tablet Generic drug: ferrous sulfate Tome 1 tableta (325 mg en total) por va oral diariamente con el desayuno. (Take 1 tablet (325 mg total) by mouth daily with breakfast.)   hydrALAZINE 100 MG tablet Commonly known as: APRESOLINE Take 1 tablet (100 mg total) by mouth 3 (three) times daily.   isosorbide mononitrate 60 MG 24 hr tablet Commonly known as: IMDUR Take 1.5 tablets (90 mg total) by mouth daily.   potassium chloride 10 MEQ tablet Commonly known as: KLOR-CON Take 1 tablet (10 mEq total) by mouth daily.   sodium bicarbonate 650 MG tablet Take 1 tablet (650 mg total) by mouth 2 (two) times daily.        DISPOSITION AND FOLLOW-UP:  Mr.Aeon Ostiana-Ramos was discharged from [facility] in Fair condition. At the hospital follow up visit please  address:  Multifocal pneumonia: Ensure completion of 14 day course of Augmentin.  Screen for continued pneumonia symptoms.  Follow-up pleural fluid culture from left-sided transudative effusion s/p thoracentesis.  Severe hypertension: Recheck blood pressure outpatient.  Ensure he has refills and is taking his medications as prescribed. His blood pressure was significantly elevated throughout his hospital course and will need stricter monitoring and management outpatient.  Acute on chronic HFmrEF: Ensure he has refills and is taking his medications as prescribed.  Assess volume status and heart failure symptoms.  CKD Stage IV: Recommend rechecking BMP and monitoring kidney function.  Ensure he establishes with a nephrologist to help manage his chronic kidney disease.  Continue to avoid nephrotoxic medications.  Follow-up Recommendations: Consults: None Labs: Pleural fluid culture Studies: repeat non-contrast chest CT in 3-6 months and again at 18-24 months for pulmonary nodules per radiology  Medications: Sent with Augmentin for 14 days to treat his multifocal pneumonia  Follow-up Appointments:  Follow-up Information     Morrie Sheldon, MD .   Specialty: Internal Medicine Contact information: [address] [place] [address] [phone]         Morrie Sheldon, MD. Go on 11/15/2022.   Specialty: Internal Medicine Why: appointment at 10:15 am Contact information: [address] [place] [address] [phone]                 HOSPITAL COURSE:  Patient Summary: Govind Lysaght is a 47 y.o. male  with a pertinent PMH of HFmrEF (EF 45-50%), CKD IV, HTN who presents with 2 days of cough with clear sputum production, streaks of blood and shortness of breath, admitted for heart failure exacerbation.  Multifocal pneumonia, predominantly on the right Moderate pleural effusion on the left s/p thoracentesis Multiple pulmonary nodules  He presented to the emergency  department with worsening shortness of breath, cough, and clear sputum production for 2 days.  He also had orthopnea and fatigue.  Chest imaging demonstrated right-sided multifocal pneumonia, multiple pulmonary nodules, and a left-sided pleural effusion. Viral panel + COVID were negative. He was started on empiric ceftriaxone and azithromycin for suspected community-acquired pneumonia.  Pulmonology was consulted who did a thoracentesis and drained 600 cc of amber-colored fluid from the left lung. Pleural fluid culture and cytology were obtained, which demonstrated a transudative effusion.  Pleural fluid culture has had no growth after 24 hours.  A very small apical pneumothorax was noted following thoracentesis, which resolved quickly on its own. His respiratory symptoms resolved other than persistent nasal congestion. He was transitioned to a 14 day course of Augmentin for continued outpatient treatment of his pneumonia.  Pulmonology has scheduled an outpatient follow-up appointment in 3 to 4 weeks.  Acute on chronic HFmrEF Severe hypertension He presented with shortness of breath, orthopnea, and some lower extremity swelling.  BNP was elevated at 505.  He said he had not been taking his medications for the past 2 weeks.  It was thought that his respiratory symptoms were likely multifocal, perhaps a pneumonia superimposed on a heart failure exacerbation causing a transudative effusion.  In the emergency department, he received 80 mg of IV Lasix which seemed to improve his symptoms.  His initial blood pressure was 244/164, but he was asymptomatic and had not been taking his hypertension medications.  He was given 1 dose of IV labetalol 20 mg and hydralazine 100 mg in the emergency department.  He was then restarted on his home Coreg and Imdur, as well as hydralazine 100 mg 3 times daily.  His blood pressure improved somewhat but remains elevated and will need to be managed more closely on an outpatient  basis.  CKD Stage IV His baseline creatinine seems to be around 3.5-3.6.  His creatinine rose to 4.23 during his hospital stay, GFR 17.  Lasix was held in the setting of acute kidney injury.  Recommend close follow-up outpatient and establishing with a nephrologist.    DISCHARGE INSTRUCTIONS:   Discharge Instructions     Call MD for:  difficulty breathing, headache or visual disturbances   Complete by: As directed    Call MD for:  persistant dizziness or light-headedness   Complete by: As directed    Call MD for:  persistant nausea and vomiting   Complete by: As directed    Call MD for:  temperature >100.4   Complete by: As directed    Diet - low sodium heart healthy   Complete by: As directed    Discharge instructions   Complete by: As directed    Dear Mr. Ostiana-Ramos,  You were in the hospital because of pneumonia and your heart failure.  You need to take Augmentin (an antibiotic) for 14 days for your pneumonia. Please take your blood pressure medicines carvedilol twice a day, imdur once a day, and hydralazine three times a day. It is very important you keep your blood pressure well controlled, to avoid another exacerbation of your heart failure.  We did not refill your lasix  because your kidney function was not doing too well.   Please go to the Internal Medicine Center (on the ground floor of [facility]) on [place], AUG[address] 16[address] at 9 am   Increase activity slowly   Complete by: As directed        SUBJECTIVE:   Patient was examined bedside this morning.  He has no acute complaints.  He has not had any cough or shortness of breath.   Discharge Vitals:   BP (!) 149/88 (BP Location: Right Arm)   Pulse 78   Temp 98.4 F (36.9 C) (Oral)   Resp 18   Ht 5\' 11"  (1.803 m)   Wt 95.4 kg   SpO2 100%   BMI 29.33 kg/m   OBJECTIVE:  Physical Exam HENT:     Mouth/Throat:     Mouth: Mucous membranes are moist.     Pharynx: Oropharynx is clear.  Cardiovascular:      Rate and Rhythm: Normal rate and regular rhythm.  Pulmonary:     Effort: Pulmonary effort is normal.     Breath sounds: Normal breath sounds.  Neurological:     General: No focal deficit present.     Mental Status: He is alert and oriented to person, place, and time.    Pertinent Labs, Studies, and Procedures:     Latest Ref Rng & Units 10/29/2022   12:19 AM 10/28/2022    9:36 AM 09/03/2022    3:46 AM  CBC  WBC 4.0 - 10.5 K/uL 6.8  8.7  6.3   Hemoglobin 13.0 - 17.0 g/dL 95.2  84.1  32.4   Hematocrit 39.0 - 52.0 % 31.2  35.0  35.5   Platelets 150 - 400 K/uL 163  184  243        Latest Ref Rng & Units 10/30/2022    2:10 AM 10/29/2022    3:12 PM 10/29/2022   12:19 AM  CMP  Glucose 70 - 99 mg/dL 98   99   BUN 6 - 20 mg/dL 42   40   Creatinine 4.01 - 1.24 mg/dL 0.27   2.53   Sodium 664 - 145 mmol/L 135   138   Potassium 3.5 - 5.1 mmol/L 4.0   3.3   Chloride 98 - 111 mmol/L 105   107   CO2 22 - 32 mmol/L 18   19   Calcium 8.9 - 10.3 mg/dL 8.2   7.7   Total Protein 6.5 - 8.1 g/dL  6.4    Total Bilirubin 0.3 - 1.2 mg/dL  0.6    Alkaline Phos 38 - 126 U/L  76    AST 15 - 41 U/L  17    ALT 0 - 44 U/L  17      DG Chest Port 1 View Result Date: 10/29/2022 IMPRESSION: There is interval decrease in small left apical pneumothorax. There is trace amount of residual left pneumothorax in the lateral aspect of left apex. Electronically Signed   By: Ernie Avena M.D.   On: 10/29/2022 18:44   DG Chest Port 1 View Result Date: 10/29/2022 IMPRESSION: Tiny left apical pneumothorax. Electronically Signed   By: Karen Kays M.D.   On: 10/29/2022 14:15   CT Chest Wo Contrast Result Date: 10/28/2022 IMPRESSION: 1. Extensive patchy airspace disease throughout the right lung. Findings are compatible with multifocal pneumonia. 2. Moderate-sized left pleural effusion with compressive atelectasis in the left lower lobe. 3. Multiple pulmonary nodules. Most significant: Right solid pulmonary nodule  measuring 6 mm. Per Fleischner Society Guidelines, recommend a non-contrast Chest CT at 3-6 months, then consider another non-contrast Chest CT at 18-24 months. If patient is low risk for malignancy, non-contrast Chest CT at 18-24 months is optional. These guidelines do not apply to immunocompromised patients and patients with cancer. Follow up in patients with significant comorbidities as clinically warranted. For lung cancer screening, adhere to Lung-RADS guidelines. Reference: Radiology. 2017; 284(1):228-43. 4. Air-fluid collection in the upper mediastinum near the thoracic inlet. 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 5. Slightly enlarged mediastinal lymph nodes. 6. Question gastric wall thickening. Consider further evaluation of the abdomen with a CT with IV contrast. 7. Sclerotic area involving the left ninth rib. This is nonspecific. No other suspicious osseous lesions. Consider correlating with PSA level. 8. Prominent main pulmonary artery. Findings can be associated with pulmonary arterial hypertension. Electronically Signed   By: Richarda Overlie M.D.   On: 10/28/2022 12:16   DG Chest 2 View Result Date: 10/28/2022 CLINICAL DATA:  hemoptysis EXAM: CHEST - 2 VIEW COMPARISON:  09/01/2022 FINDINGS: Patchy airspace opacities in the right upper lung are more conspicuous than on prior exam. Left lower lobe airspace disease improved with some residual infrahilar opacities. Heart size and mediastinal contours are within normal limits. No effusion. Visualized bones unremarkable. IMPRESSION: Increasing right upper lobe airspace disease. Electronically Signed   By: Corlis Leak M.D.   On: 10/28/2022 09:52     Signed: Annett Fabian, MD Internal Medicine Resident, PGY-1 [facility]  Pager: [phone] 5:08 PM, 10/30/2022

## 2022-10-30 NOTE — Progress Notes (Signed)
10/30/2022  Imaging stable with small PTX possibly just incomplete re-expansion. Okay for f/u in pulmonary office: being set up. Initial fluid review looks benign. Please reach out PRN.  Myrla Halsted MD

## 2022-10-31 ENCOUNTER — Other Ambulatory Visit (HOSPITAL_COMMUNITY): Payer: Self-pay

## 2022-10-31 ENCOUNTER — Telehealth: Payer: Self-pay

## 2022-10-31 NOTE — Transitions of Care (Post Inpatient/ED Visit) (Signed)
   10/31/2022  Name: Mark Hayden MRN: [id number] DOB: 07-24-1975  Today's TOC FU Call Status: Today's TOC FU Call Status:: Unsuccessful Call (1st Attempt) Unsuccessful Call (1st Attempt) Date: 10/31/22  Attempted to reach the patient regarding the most recent Inpatient/ED visit.  Follow Up Plan: Additional outreach attempts will be made to reach the patient to complete the Transitions of Care (Post Inpatient/ED visit) call.   Signature   Woodfin Ganja LPN [facility] Nurse Health Advisor Direct Dial [phone]

## 2022-10-31 NOTE — Telephone Encounter (Signed)
This encounter was created in error - please disregard.  This encounter was created in error - please disregard.

## 2022-11-01 ENCOUNTER — Telehealth: Payer: Self-pay

## 2022-11-01 NOTE — Transitions of Care (Post Inpatient/ED Visit) (Signed)
   11/01/2022  Name: Mark Hayden MRN: [id number] DOB: Jul 14, 1975  Today's TOC FU Call Status: Today's TOC FU Call Status:: Unsuccessful Call (1st Attempt) Unsuccessful Call (1st Attempt) Date: 11/01/22  Attempted to reach the patient regarding the most recent Inpatient/ED visit.  Follow Up Plan: Additional outreach attempts will be made to reach the patient to complete the Transitions of Care (Post Inpatient/ED visit) call.   Signature Alyse Low, RN, BA, [facility], [facility] Management Coordinator - Transition of Care Ph [phone]

## 2022-11-02 ENCOUNTER — Telehealth: Payer: Self-pay

## 2022-11-02 ENCOUNTER — Other Ambulatory Visit: Payer: Self-pay

## 2022-11-02 ENCOUNTER — Encounter: Payer: Self-pay | Admitting: Internal Medicine

## 2022-11-02 ENCOUNTER — Other Ambulatory Visit (INDEPENDENT_AMBULATORY_CARE_PROVIDER_SITE_OTHER): Payer: Commercial Managed Care - HMO

## 2022-11-02 ENCOUNTER — Ambulatory Visit: Payer: Commercial Managed Care - HMO | Admitting: Internal Medicine

## 2022-11-02 ENCOUNTER — Telehealth: Payer: Self-pay | Admitting: *Deleted

## 2022-11-02 VITALS — BP 166/104 | HR 76 | Temp 97.8°F | Ht 71.0 in | Wt 213.8 lb

## 2022-11-02 DIAGNOSIS — I130 Hypertensive heart and chronic kidney disease with heart failure and stage 1 through stage 4 chronic kidney disease, or unspecified chronic kidney disease: Secondary | ICD-10-CM | POA: Diagnosis not present

## 2022-11-02 DIAGNOSIS — I517 Cardiomegaly: Secondary | ICD-10-CM

## 2022-11-02 DIAGNOSIS — J188 Other pneumonia, unspecified organism: Secondary | ICD-10-CM | POA: Diagnosis not present

## 2022-11-02 DIAGNOSIS — N184 Chronic kidney disease, stage 4 (severe): Secondary | ICD-10-CM | POA: Diagnosis not present

## 2022-11-02 DIAGNOSIS — I5022 Chronic systolic (congestive) heart failure: Secondary | ICD-10-CM

## 2022-11-02 DIAGNOSIS — Z87891 Personal history of nicotine dependence: Secondary | ICD-10-CM

## 2022-11-02 DIAGNOSIS — I10 Essential (primary) hypertension: Secondary | ICD-10-CM

## 2022-11-02 DIAGNOSIS — D509 Iron deficiency anemia, unspecified: Secondary | ICD-10-CM

## 2022-11-02 DIAGNOSIS — J189 Pneumonia, unspecified organism: Secondary | ICD-10-CM

## 2022-11-02 MED ORDER — TORSEMIDE 10 MG PO TABS: 20.0000 mg | ORAL_TABLET | Freq: Every day | ORAL | 2 refills | Status: DC

## 2022-11-02 NOTE — Progress Notes (Unsigned)
CC: abdominal pain and dyspnea  HPI:  [name] is a 47 y.o. male with past medical history as detailed below who presents following lab only visit with concern for abdominal pain and dyspnea. Please see problem based charting for detailed assessment and plan.  Past Medical History:  Diagnosis Date   CHF (congestive heart failure) (HCC)    Hypertension    Hypertensive emergency 08/20/2022   Suicidal thoughts 09/08/2019   Review of Systems:  Negative unless otherwise stated.  Physical Exam:  Vitals:   11/02/22 1032 11/02/22 1120  BP: (!) 167/95 (!) 166/104  Pulse: 81 76  Temp: 97.8 F (36.6 C)   TempSrc: Oral   SpO2: 100%   Weight: 213 lb 12.8 oz (97 kg)   Height: 5\' 11"  (1.803 m)    Constitutional:Anxious appearing but in no acute distress. Cardio:Regular rate and rhythm. No pitting edema to bilateral LE. Pulm:Clear to auscultation bilaterally. Normal work of breathing on room air. Abdomen:Soft, nontender, nondistended. No fluid wave. VHQ:IONGEXB non-pitting edema to bilateral LE.  Skin:Warm and dry. Neuro:Alert and oriented x3. No focal deficit noted. Psych:Anxious but pleasant.  Assessment & Plan:   See Encounters Tab for problem based charting.  Multifocal pneumonia Remains on augmentin 500-125 mg BID. Last day 08/27. Pleural fluid analysis consistent with transudative process. Follow up at previously scheduled HFU visit on 08/29 for continued improvement of respiratory symptoms and completion of augmentin course. F/u with pulmonology scheduled for 09/09.  Hypertension BP today 167/95 > 166/104. OP regimen includes carvedilol 25 mg BID, hydralazine 100 mg TID, isosorbide mononitrate 90 mg daily. Recently also on furosemide 40 mg daily which was held at discharge. He reports adherence to his medication regimen and did take his morning doses today. Assessment:I suspect a component of volume overload is worsening his blood pressure. Plan:Continue  current regimen. Start torsemide 20 mg daily. If additional BP control needed at follow up visit, can consider starting amlodipine. Remainder of treatment options limited by chronic kidney disease. Can consider work up for secondary hypertension.  CKD (chronic kidney disease) stage 4, GFR 15-29 ml/min (HCC) He does not think he has followed up with nephrology. On review of previous admissions I do see that Dr. Ronalee Belts messaged the front office at [facility] to establish him as a patient there. I suspect he will need vein mapping and fistula creation for impending hemodialysis needs given current state of renal health and apparent continued decline. Patient educated on severity of kidney disease and need for urgent and regular evaluation by nephrology. Plan:Urgent referral to nephrology placed as patient does not believe he has established with their office.Consider increasing dose of sodium bicarbonate at next OV as current dose is low.  Iron deficiency anemia Patient does not believe he is taking iron supplementation. Advised him that this is on his medication regimen. Would refer him for colonoscopy given young age in this setting, also noting however his kidney disease and the role this may have on iron deficiency.  Heart failure with mildly reduced ejection fraction (HFmrEF) [facility]) Patient presented for follow up blood work following recent hospitalization and requested to speak with a physician due to concerns that he was having difficulty breathing and having abdominal pain.  On further questioning, he explains that he still has some dyspnea and overnight developed new difficulty laying flat. He was still able to sleep despite this. He is also having abdominal distention that he feels is fluid retention; normally when he has fluid retention it is  diffuse and involves his extremities as well. He has a persistent cough and some pain in his chest when he coughs. He is having some phlegm production but cannot  comment on the color. No nausea or vomiting, no fevers.   He explains that while hospitalized he did feel improvement of his breathing but that is no longer the case. He did undergo a thoracentesis and had 600 cc fluid removed which was consistent with a transudative process. Post-procedure chest x-ray showed incomplete re-expansion.    Echocardiogram during that hospital stay showed LVEF 45-50% with mildly decreased function, global hypokinesis, severe LVH, abnormal global longitudinal strain; mildly dilated LA; moderate to severe MV regurgitation without evidence of stenosis; AV stenosis; severe pulmonic valve regurgitation. He has a cardiac MRI scheduled for 11/21/2022.  GDMT limited by severity of kidney disease. Furosemide was held at discharge due to concern for worsened AKI on CKD stage 4.  Assessment:On exam, patient is not overtly volume overloaded. His lungs are clear, he is saturating well on room air, and has non-pitting mild edema to his bilateral LE. No fluid wave noted on abdominal exam. No abdominal wall edema appreciated. Changes to GFR during most recent admission were minimal, 19>17>17 despite larger changes in serum creatinine. Given subjective feeling of volume overload and new dyspnea I do worry that he will not tolerate not having diuresis.  Plan:F/u BMP collected prior to OV. Will not restart furosemide but instead start torsemide 20 mg daily. F/u with [facility] 08/29. F/u with cardiology scheduled for 08/26.  Patient discussed with Dr. Mikey Bussing

## 2022-11-02 NOTE — Transitions of Care (Post Inpatient/ED Visit) (Signed)
   11/02/2022  Name: Mark Hayden MRN: [id number] DOB: [birth year]  Today's TOC FU Call Status: Today's TOC FU Call Status:: Unsuccessful Call (2nd Attempt) Unsuccessful Call (2nd Attempt) Date: 11/02/22  Attempted to reach the patient regarding the most recent Inpatient/ED visit.  Follow Up Plan: Additional outreach attempts will be made to reach the patient to complete the Transitions of Care (Post Inpatient/ED visit) call.   Signature Alyse Low, RN, BA, [facility], [employer] [facility] Coordinator, Transition of Care Ph # [phone]

## 2022-11-02 NOTE — Telephone Encounter (Signed)
Patient walked in c/o of abdominal pain.  No nausea.  Coughing up Phlegm.  No fevers.  Shortness of breath .  Has questions about what is going on.  Thinks he is retaining water in his stomach.  Unable to take a deep breath.   O2 sat when patient  walked was 98%.

## 2022-11-02 NOTE — Patient Instructions (Addendum)
[name]  It was nice seeing you today! Thank you for choosing [facility] for your Primary Care.    I would like you to start taking torsemide 20 mg daily. Do NOT take your lasix. I am making no other medication changes.  I am also placing a referral to nephrology ([facility]). You will receive a call to schedule that visit.  Please make sure to come to your follow up visit on August 29 at 10:15 AM. Contact the clinic if you feel that you need to be seen sooner.  My best, Dr. August Saucer

## 2022-11-03 DIAGNOSIS — I5022 Chronic systolic (congestive) heart failure: Secondary | ICD-10-CM | POA: Insufficient documentation

## 2022-11-03 DIAGNOSIS — I5033 Acute on chronic diastolic (congestive) heart failure: Secondary | ICD-10-CM | POA: Insufficient documentation

## 2022-11-03 DIAGNOSIS — I5032 Chronic diastolic (congestive) heart failure: Secondary | ICD-10-CM | POA: Insufficient documentation

## 2022-11-03 DIAGNOSIS — I517 Cardiomegaly: Secondary | ICD-10-CM | POA: Insufficient documentation

## 2022-11-03 NOTE — Assessment & Plan Note (Addendum)
BP today 167/95 > 166/104. OP regimen includes carvedilol 25 mg BID, hydralazine 100 mg TID, isosorbide mononitrate 90 mg daily. Recently also on furosemide 40 mg daily which was held at discharge. He reports adherence to his medication regimen and did take his morning doses today. Assessment:I suspect a component of volume overload is worsening his blood pressure. Plan:Continue current regimen. Start torsemide 20 mg daily. If additional BP control needed at follow up visit, can consider starting amlodipine. Remainder of treatment options limited by chronic kidney disease. Can consider work up for secondary hypertension.

## 2022-11-03 NOTE — Assessment & Plan Note (Addendum)
He does not think he has followed up with nephrology. On review of previous admissions I do see that Dr. Ronalee Belts messaged the front office at [facility] to establish him as a patient there. I suspect he will need vein mapping and fistula creation for impending hemodialysis needs given current state of renal health and apparent continued decline. Patient educated on severity of kidney disease and need for urgent and regular evaluation by nephrology. Plan:Urgent referral to nephrology placed as patient does not believe he has established with their office.Consider increasing dose of sodium bicarbonate at next OV as current dose is low.

## 2022-11-03 NOTE — Assessment & Plan Note (Addendum)
Patient presented for follow up blood work following recent hospitalization and requested to speak with a physician due to concerns that he was having difficulty breathing and having abdominal pain.  On further questioning, he explains that he still has some dyspnea and overnight developed new difficulty laying flat. He was still able to sleep despite this. He is also having abdominal distention that he feels is fluid retention; normally when he has fluid retention it is diffuse and involves his extremities as well. He has a persistent cough and some pain in his chest when he coughs. He is having some phlegm production but cannot comment on the color. No nausea or vomiting, no fevers.   He explains that while hospitalized he did feel improvement of his breathing but that is no longer the case. He did undergo a thoracentesis and had 600 cc fluid removed which was consistent with a transudative process. Post-procedure chest x-ray showed incomplete re-expansion.    Echocardiogram during that hospital stay showed LVEF 45-50% with mildly decreased function, global hypokinesis, severe LVH, abnormal global longitudinal strain; mildly dilated LA; moderate to severe MV regurgitation without evidence of stenosis; AV stenosis; severe pulmonic valve regurgitation. He has a cardiac MRI scheduled for 11/21/2022.  GDMT limited by severity of kidney disease. Furosemide was held at discharge due to concern for worsened AKI on CKD stage 4.  Assessment:On exam, patient is not overtly volume overloaded. His lungs are clear, he is saturating well on room air, and has non-pitting mild edema to his bilateral LE. No fluid wave noted on abdominal exam. No abdominal wall edema appreciated. Changes to GFR during most recent admission were minimal, 19>17>17 despite larger changes in serum creatinine. Given subjective feeling of volume overload and new dyspnea I do worry that he will not tolerate not having diuresis.  Plan:F/u BMP  collected prior to OV. Will not restart furosemide but instead start torsemide 20 mg daily. F/u with [facility] 08/29. F/u with cardiology scheduled for 08/26.

## 2022-11-03 NOTE — Assessment & Plan Note (Addendum)
Remains on augmentin 500-125 mg BID. Last day 08/27. Pleural fluid analysis consistent with transudative process. Follow up at previously scheduled HFU visit on 08/29 for continued improvement of respiratory symptoms and completion of augmentin course. F/u with pulmonology scheduled for 09/09.

## 2022-11-03 NOTE — Assessment & Plan Note (Addendum)
Patient does not believe he is taking iron supplementation. Advised him that this is on his medication regimen. Would refer him for colonoscopy given young age in this setting, also noting however his kidney disease and the role this may have on iron deficiency.

## 2022-11-05 ENCOUNTER — Telehealth: Payer: Self-pay

## 2022-11-05 LAB — BMP8+ANION GAP
Anion Gap: 13 mmol/L (ref 10.0–18.0)
BUN/Creatinine Ratio: 10 (ref 9–20)
BUN: 35 mg/dL — ABNORMAL HIGH (ref 6–24)
CO2: 17 mmol/L — ABNORMAL LOW (ref 20–29)
Calcium: 8.4 mg/dL — ABNORMAL LOW (ref 8.7–10.2)
Chloride: 111 mmol/L — ABNORMAL HIGH (ref 96–106)
Creatinine, Ser: 3.58 mg/dL — ABNORMAL HIGH (ref 0.76–1.27)
Glucose: 106 mg/dL — ABNORMAL HIGH (ref 70–99)
Potassium: 4.5 mmol/L (ref 3.5–5.2)
Sodium: 141 mmol/L (ref 134–144)
eGFR: 20 mL/min/{1.73_m2} — ABNORMAL LOW (ref >59)

## 2022-11-05 NOTE — Transitions of Care (Post Inpatient/ED Visit) (Signed)
   11/05/2022  Name: Mark Hayden MRN: [id number] DOB: 1975-10-09  Today's TOC FU Call Status: Today's TOC FU Call Status:: Unsuccessful Call (3rd Attempt) Unsuccessful Call (3rd Attempt) Date: 11/05/22  Attempted to reach the patient regarding the most recent Inpatient/ED visit.  Follow Up Plan: No further outreach attempts will be made at this time. We have been unable to contact the patient.  Signature Alyse Low, RN, BA, [facility], [facility] Coordinator, Transition of Care Ph [phone]

## 2022-11-07 ENCOUNTER — Telehealth: Payer: Self-pay

## 2022-11-07 NOTE — Progress Notes (Signed)
Internal Medicine Clinic Attending  Case discussed with the resident at the time of the visit.  We reviewed the resident's history and exam and pertinent patient test results.  I agree with the assessment, diagnosis, and plan of care documented in the resident's note.  

## 2022-11-07 NOTE — Telephone Encounter (Signed)
Pt is requesting call back  about his results  from his last office visit

## 2022-11-07 NOTE — Telephone Encounter (Signed)
Okay great, thank you Byrd Hesselbach!

## 2022-11-07 NOTE — Telephone Encounter (Signed)
Mark Hayden talked about this on the phone yesterday right?

## 2022-11-12 ENCOUNTER — Other Ambulatory Visit (HOSPITAL_COMMUNITY): Payer: Self-pay

## 2022-11-12 ENCOUNTER — Encounter (HOSPITAL_COMMUNITY): Payer: Self-pay | Admitting: Cardiology

## 2022-11-12 ENCOUNTER — Ambulatory Visit (HOSPITAL_COMMUNITY)
Admission: RE | Admit: 2022-11-12 | Discharge: 2022-11-12 | Disposition: A | Discharge status: Home / Self Care | Payer: Commercial Managed Care - HMO | Source: Ambulatory Visit | Attending: Cardiology | Admitting: Cardiology

## 2022-11-12 VITALS — BP 164/82 | HR 84 | Wt 209.2 lb

## 2022-11-12 DIAGNOSIS — Z8249 Family history of ischemic heart disease and other diseases of the circulatory system: Secondary | ICD-10-CM | POA: Insufficient documentation

## 2022-11-12 DIAGNOSIS — Z5982 Transportation insecurity: Secondary | ICD-10-CM | POA: Insufficient documentation

## 2022-11-12 DIAGNOSIS — Z79899 Other long term (current) drug therapy: Secondary | ICD-10-CM | POA: Diagnosis not present

## 2022-11-12 DIAGNOSIS — I10 Essential (primary) hypertension: Secondary | ICD-10-CM

## 2022-11-12 DIAGNOSIS — I130 Hypertensive heart and chronic kidney disease with heart failure and stage 1 through stage 4 chronic kidney disease, or unspecified chronic kidney disease: Secondary | ICD-10-CM | POA: Insufficient documentation

## 2022-11-12 DIAGNOSIS — Z5986 Financial insecurity: Secondary | ICD-10-CM | POA: Insufficient documentation

## 2022-11-12 DIAGNOSIS — Z5941 Food insecurity: Secondary | ICD-10-CM | POA: Diagnosis not present

## 2022-11-12 DIAGNOSIS — I5022 Chronic systolic (congestive) heart failure: Secondary | ICD-10-CM

## 2022-11-12 DIAGNOSIS — N184 Chronic kidney disease, stage 4 (severe): Secondary | ICD-10-CM | POA: Insufficient documentation

## 2022-11-12 DIAGNOSIS — Z7984 Long term (current) use of oral hypoglycemic drugs: Secondary | ICD-10-CM | POA: Insufficient documentation

## 2022-11-12 DIAGNOSIS — I340 Nonrheumatic mitral (valve) insufficiency: Secondary | ICD-10-CM

## 2022-11-12 LAB — BASIC METABOLIC PANEL
Anion gap: 10 (ref 5–15)
BUN: 39 mg/dL — ABNORMAL HIGH (ref 6–20)
CO2: 19 mmol/L — ABNORMAL LOW (ref 22–32)
Calcium: 7.5 mg/dL — ABNORMAL LOW (ref 8.9–10.3)
Chloride: 108 mmol/L (ref 98–111)
Creatinine, Ser: 3.89 mg/dL — ABNORMAL HIGH (ref 0.61–1.24)
GFR, Estimated: 19 mL/min — ABNORMAL LOW (ref >60)
Glucose, Bld: 101 mg/dL — ABNORMAL HIGH (ref 70–99)
Potassium: 3.6 mmol/L (ref 3.5–5.1)
Sodium: 137 mmol/L (ref 135–145)

## 2022-11-12 LAB — BRAIN NATRIURETIC PEPTIDE: B Natriuretic Peptide: 303.9 pg/mL — ABNORMAL HIGH (ref 0.0–100.0)

## 2022-11-12 MED ORDER — AMLODIPINE BESYLATE 10 MG PO TABS: 10.0000 mg | ORAL_TABLET | Freq: Every day | ORAL | 3 refills | Status: DC

## 2022-11-12 MED ORDER — DAPAGLIFLOZIN PROPANEDIOL 10 MG PO TABS: 10.0000 mg | ORAL_TABLET | Freq: Every day | ORAL | 8 refills | Status: DC

## 2022-11-12 NOTE — Patient Instructions (Addendum)
Good to see you today!  Start Farxiga 10 mg daily  START Amlodipine 10 mg daily  Lab work repeat in 1 week  Please complete Itamar sleep study  Your physician recommends that you schedule a follow-up appointment in: 3 months with app clinic  Your physician has requested that you have a renal artery duplex. During this test, an ultrasound is used to evaluate blood flow to the kidneys. Allow one hour for this exam. Do not eat after midnight the day before and avoid carbonated beverages. Take your medications as you usually do.  If you have any questions or concerns before your next appointment please send [place] a message through [place] or call our office at [phone].    TO LEAVE A MESSAGE FOR THE NURSE SELECT OPTION 2, PLEASE LEAVE A MESSAGE INCLUDING: YOUR NAME DATE OF BIRTH CALL BACK NUMBER REASON FOR CALL**this is important as we prioritize the call backs  YOU WILL RECEIVE A CALL BACK THE SAME DAY AS LONG AS YOU CALL BEFORE 4:00 PM  At the [facility], you and your health needs are our priority. As part of our continuing mission to provide you with exceptional heart care, we have created designated Provider Care Teams. These Care Teams include your primary Cardiologist (physician) and Advanced Practice Providers (APPs- Physician Assistants and Nurse Practitioners) who all work together to provide you with the care you need, when you need it.   You may see any of the following providers on your designated Care Team at your next follow up: Dr Arvilla Meres Dr Marca Ancona Dr. Marcos Eke, NP Robbie Lis, [place] [facility] [place], [place] Brynda Peon, NP Karle Plumber, PharmD   Please be sure to bring in all your medications bottles to every appointment.    Thank you for choosing [facility]

## 2022-11-12 NOTE — Addendum Note (Signed)
Encounter addended by: Suezanne Cheshire, RN on: 11/12/2022 4:26 PM  Actions taken: Order list changed

## 2022-11-12 NOTE — Addendum Note (Signed)
Encounter addended by: Suezanne Cheshire, RN on: 11/12/2022 4:36 PM  Actions taken: Clinical Note Signed

## 2022-11-12 NOTE — Progress Notes (Signed)
At patient's visit today reminded him to complete Itamar home sleep study. Reviewed instructions with him.

## 2022-11-12 NOTE — Addendum Note (Signed)
Encounter addended by: Chinita Pester, CMA on: 11/12/2022 4:34 PM  Actions taken: Order list changed, Diagnosis association updated

## 2022-11-12 NOTE — Progress Notes (Signed)
ADVANCED HEART FAILURE CLINIC NOTE  Referring Physician: No ref. provider found  Primary Care: Lovie Macadamia, MD HF: Dr. Gasper Lloyd  HPI: Mark Hayden is a 47 y.o. male with uncontrolled hypertension, CKD 4, multiple admissions for hypertensive crisis and heart failure with preserved ejection fraction/now mildly reduced presenting today to establish care.  He has been admitted in May 2024 and June 2024 with hypertensive emergency requiring IV diuretics for volume overload and aggressive antihypertensive medications.  Based on chart review it appears that these admissions were secondary to dietary and medication noncompliance.  Since discharge she has been seen in [facility] and apt clinic where medications were slowly uptitrated and the importance of medication compliance was further pushed.  Since his last appointment, he is doing fairly well from a heart failure standpoint.  No significant lower extremity edema.  He reports that his shortness of breath is very well-controlled but continues to have uncontrolled hypertension with blood pressures in the 160s.  He continues to use marijuana but does not report using any other drugs.  His family history is significant for mother with heart failure/CKD/uncontrolled hypertension and dialysis.    Past Medical History:  Diagnosis Date   CHF (congestive heart failure) (HCC)    Hypertension    Hypertensive emergency 08/20/2022   Suicidal thoughts 09/08/2019    Current Outpatient Medications  Medication Sig Dispense Refill   amoxicillin-clavulanate (AUGMENTIN) 500-125 MG tablet Take 1 tablet by mouth 2 (two) times daily for 14 days. 28 tablet 0   Blood Pressure Monitor DEVI Use to check blood pressure daily 1 each 0   carvedilol (COREG) 25 MG tablet Take 1 tablet (25 mg total) by mouth 2 (two) times daily. 60 tablet 0   hydrALAZINE (APRESOLINE) 100 MG tablet Take 1 tablet (100 mg total) by mouth 3 (three) times daily. 90 tablet 0    isosorbide mononitrate (IMDUR) 60 MG 24 hr tablet Take 1.5 tablets (90 mg total) by mouth daily. 60 tablet 0   potassium chloride (KLOR-CON) 10 MEQ tablet Take 1 tablet (10 mEq total) by mouth daily. 90 tablet 3   sodium bicarbonate 650 MG tablet Take 1 tablet (650 mg total) by mouth 2 (two) times daily. 60 tablet 1   torsemide (DEMADEX) 10 MG tablet Take 2 tablets (20 mg total) by mouth daily. 60 tablet 2   ferrous sulfate 325 (65 FE) MG tablet Take 1 tablet (325 mg total) by mouth daily with breakfast. (Patient not taking: Reported on 11/12/2022) 30 tablet 1   No current facility-administered medications for this encounter.    Allergies  Allergen Reactions   Shellfish Allergy Anaphylaxis and Itching    seafood      Social History   Socioeconomic History   Marital status: Single    Spouse name: Not on file   Number of children: 1   Years of education: Not on file   Highest education level: High school graduate  Occupational History   Occupation: [employer]  Tobacco Use   Smoking status: Former   Smokeless tobacco: Never  Advertising account planner   Vaping status: Never Used  Substance and Sexual Activity   Alcohol use: No   Drug use: No   Sexual activity: Not on file  Other Topics Concern   Not on file  Social History Narrative   Not on file   Social Determinants of Health   Financial Resource Strain: High Risk (09/12/2022)   Overall Financial Resource Strain (CARDIA)    Difficulty of Paying  Living Expenses: Very hard  Food Insecurity: Food Insecurity Present (09/05/2022)   Hunger Vital Sign    Worried About Running Out of Food in the Last Year: Sometimes true    Ran Out of Food in the Last Year: Sometimes true  Transportation Needs: No Transportation Needs (09/05/2022)   PRAPARE - Administrator, Civil Service (Medical): No    Lack of Transportation (Non-Medical): No  Recent Concern: Transportation Needs - Unmet Transportation Needs (08/17/2022)   PRAPARE -  Administrator, Civil Service (Medical): Yes    Lack of Transportation (Non-Medical): Yes  Physical Activity: Not on file  Stress: Not on file  Social Connections: Not on file  Intimate Partner Violence: Not At Risk (08/17/2022)   Humiliation, Afraid, Rape, and Kick questionnaire    Fear of Current or Ex-Partner: No    Emotionally Abused: No    Physically Abused: No    Sexually Abused: No      Family History  Problem Relation Age of Onset   Diabetes Mother    Diabetes Father     PHYSICAL EXAM: Vitals:   11/12/22 1539  BP: (!) 164/82  Pulse: 84  SpO2: 98%   GENERAL: Well nourished, well developed, and in no apparent distress at rest.  HEENT: Negative for arcus senilis or xanthelasma. There is no scleral icterus.  The mucous membranes are pink and moist.   NECK: Supple, No masses. Normal carotid upstrokes without bruits. No masses or thyromegaly.    CHEST: There are no chest wall deformities. There is no chest wall tenderness. Respirations are unlabored.  Lungs-CTA bilaterally CARDIAC:  JVP: 7 cm H2O         Normal S1, S2  Normal rate with regular rhythm. No murmurs, rubs or gallops.  Pulses are 2+ and symmetrical in upper and lower extremities.  No edema.  ABDOMEN: Soft, non-tender, non-distended. There are no masses or hepatomegaly. There are normal bowel sounds.  EXTREMITIES: Warm and well perfused with no cyanosis, clubbing.  LYMPHATIC: No axillary or supraclavicular lymphadenopathy.  NEUROLOGIC: Patient is oriented x3 with no focal or lateralizing neurologic deficits.  PSYCH: Patients affect is appropriate, there is no evidence of anxiety or depression.  SKIN: Warm and dry; no lesions or wounds.   DATA REVIEW  ECG: 10/28/22: Normal sinus rhythm with LVH and repull abnormalities as per my personal interpretation  ECHO: 08/18/2022: LVEF 45 to 50% with severe LVH, moderate to severe mitral regurgitation as per my personal interpretation   ASSESSMENT &  PLAN:  Heart with mildly reduced EF -His echocardiogram demonstrates severe LVH.  This is very likely secondary to years of uncontrolled hypertension evidenced by multiple admissions for hypertensive emergency.  He has a cardiac MRI pending next week.  Will follow-up results to rule out any infiltrative disease (HCM, amyloid) -Blood pressure today remains severely elevated.  His GDMT is limited by advanced CKD.  According to him he received a call from his nephrologist that he will very likely progressed to ESRD in the very near future requiring dialysis.  I had a lengthy discussion with him regarding possible risk/benefits of SGLT2 inhibitors and his advanced kidney disease.  Will try Farxiga 10 mg daily. -Continue hydralazine 100 mg 3 times daily/Imdur 60 mg -Start amlodipine 10 mg daily  2.  Uncontrolled hypertension -Obtain renal artery Dopplers -Antihypertensives as noted above  3.  Moderate to severe MR -Severe MR in the setting of hypertensive emergency.  Will reevaluate by MRI at  follow-up visit.  4.  CKD 4 -Unfortunately he will likely progressed to ESRD.  I emphasized the importance of medication compliance numerous times today.  Saquan Furtick Advanced Heart Failure Mechanical Circulatory Support

## 2022-11-13 ENCOUNTER — Encounter (INDEPENDENT_AMBULATORY_CARE_PROVIDER_SITE_OTHER): Payer: Commercial Managed Care - HMO | Admitting: Cardiology

## 2022-11-13 DIAGNOSIS — G4733 Obstructive sleep apnea (adult) (pediatric): Secondary | ICD-10-CM

## 2022-11-14 ENCOUNTER — Ambulatory Visit: Payer: Commercial Managed Care - HMO | Attending: Physician Assistant

## 2022-11-14 DIAGNOSIS — G4733 Obstructive sleep apnea (adult) (pediatric): Secondary | ICD-10-CM

## 2022-11-14 NOTE — Procedures (Signed)
   SLEEP STUDY REPORT Patient Information Study Date: 11/13/2022 Patient Name: Mark Hayden Patient ID: [id number] Birth Date: 1975/08/16 Age: 47 Gender: Male BMI: 28.4 (W=203 lb, H=5' 11'') Referring Physician: Anna Genre  TEST DESCRIPTION: Home sleep apnea testing was completed using the WatchPat, a Type 1 device, utilizing  peripheral arterial tonometry (PAT), chest movement, actigraphy, pulse oximetry, pulse rate, body position and snore.  AHI was calculated with apnea and hypopnea using valid sleep time as the denominator. RDI includes apneas,  hypopneas, and RERAs. The data acquired and the scoring of sleep and all associated events were performed in  accordance with the recommended standards and specifications as outlined in the AASM Manual for the Scoring of  Sleep and Associated Events 2.2.0 (2015).  FINDINGS:  1. Severe Obstructive Sleep Apnea with AHI 97.1/hr.   2. No Central Sleep Apnea with pAHIc 10.8/hr.  3. Oxygen desaturations as low as 75%.  4. Severe snoring was present. O2 sats were < 88% for 21.7 min.  5. Total sleep time was 6 hrs and 2 min.  6. 14.2% of total sleep time was spent in REM sleep.   7. Normal sleep onset latency at 12 min.   8. Prolonged REM sleep onset latency at 156 min.   9. Total awakenings were 7.  10. Arrhythmia detection: None.  DIAGNOSIS:  Severe Obstructive Sleep Apnea (G47.33) Nocturnal Hypoxemia  RECOMMENDATIONS: 1. Clinical correlation of these findings is necessary. The decision to treat obstructive sleep apnea (OSA) is usually  based on the presence of apnea symptoms or the presence of associated medical conditions such as Hypertension,  Congestive Heart Failure, Atrial Fibrillation or Obesity. The most common symptoms of OSA are snoring, gasping for  breath while sleeping, daytime sleepiness and fatigue.   2. Initiating apnea therapy is recommended given the presence of symptoms and/or associated conditions.   Recommend proceeding with one of the following:   a. Auto-CPAP therapy with a pressure range of 5-20cm H2O.   b. An oral appliance (OA) that can be obtained from certain dentists with expertise in sleep medicine. These are  primarily of use in non-obese patients with mild and moderate disease.   c. An ENT consultation which may be useful to look for specific causes of obstruction and possible treatment  options.   d. If patient is intolerant to PAP therapy, consider referral to ENT for evaluation for hypoglossal nerve stimulator.   3. Close follow-up is necessary to ensure success with CPAP or oral appliance therapy for maximum benefit .  4. A follow-up oximetry study on CPAP is recommended to assess the adequacy of therapy and determine the need  for supplemental oxygen or the potential need for Bi-level therapy. An arterial blood gas to determine the adequacy of  baseline ventilation and oxygenation should also be considered.  5. Healthy sleep recommendations include: adequate nightly sleep (normal 7-9 hrs/night), avoidance of caffeine after  noon and alcohol near bedtime, and maintaining a sleep environment that is cool, dark and quiet.  6. Weight loss for overweight patients is recommended. Even modest amounts of weight loss can significantly  improve the severity of sleep apnea.  7. Snoring recommendations include: weight loss where appropriate, side sleeping, and avoidance of alcohol before  bed.   8. Operation of motor vehicle should be avoided when sleepy. Signature: Armanda Magic, MD; [facility]; Diplomat, American Board of Sleep  Medicine Electronically Signed: 11/14/2022 11:31:49 AM

## 2022-11-15 ENCOUNTER — Ambulatory Visit (INDEPENDENT_AMBULATORY_CARE_PROVIDER_SITE_OTHER): Payer: Commercial Managed Care - HMO | Admitting: Student

## 2022-11-15 ENCOUNTER — Other Ambulatory Visit: Payer: Self-pay

## 2022-11-15 VITALS — BP 131/72 | HR 76 | Temp 98.0°F | Resp 20 | Ht 71.0 in | Wt 213.5 lb

## 2022-11-15 DIAGNOSIS — J189 Pneumonia, unspecified organism: Secondary | ICD-10-CM | POA: Diagnosis not present

## 2022-11-15 DIAGNOSIS — N184 Chronic kidney disease, stage 4 (severe): Secondary | ICD-10-CM

## 2022-11-15 DIAGNOSIS — I130 Hypertensive heart and chronic kidney disease with heart failure and stage 1 through stage 4 chronic kidney disease, or unspecified chronic kidney disease: Secondary | ICD-10-CM

## 2022-11-15 DIAGNOSIS — I10 Essential (primary) hypertension: Secondary | ICD-10-CM

## 2022-11-15 DIAGNOSIS — Z87891 Personal history of nicotine dependence: Secondary | ICD-10-CM

## 2022-11-15 DIAGNOSIS — I5022 Chronic systolic (congestive) heart failure: Secondary | ICD-10-CM

## 2022-11-15 DIAGNOSIS — G4733 Obstructive sleep apnea (adult) (pediatric): Secondary | ICD-10-CM | POA: Insufficient documentation

## 2022-11-15 DIAGNOSIS — F32A Depression, unspecified: Secondary | ICD-10-CM | POA: Insufficient documentation

## 2022-11-15 MED ORDER — SERTRALINE HCL 50 MG PO TABS: 50.0000 mg | ORAL_TABLET | Freq: Every day | ORAL | 2 refills | Status: DC

## 2022-11-15 NOTE — Assessment & Plan Note (Signed)
Last BMP on 11/12/22 showed a GFR of 19, which is his baseline.  It also showed a creatinine of 3.89, which also appears to be his new baseline.  The patient noted that he was contacted by nephrology but they were unable to schedule a visit or hoping to begin discussions with dialysis.  Will continue to avoid nephrotoxic medications. - Follow-up with nephrology

## 2022-11-15 NOTE — Assessment & Plan Note (Signed)
Patient was noted to have elevated blood pressure throughout his hospital course and at his cardiology follow-up appointments.  Today, his blood pressure was normal at 131/72.  He has been taking his blood pressure at home and yesterday, his blood pressure was 132/89.  He states that since being discharged, he has been taking his medications regularly.  He denies any headaches, vision changes, or other signs or symptoms. - Continue medication regimen

## 2022-11-15 NOTE — Progress Notes (Signed)
CC: Hospital follow-up  HPI: [name] is a 47 y.o. male living with a history stated below and presents today for hospital-follow up. Please see problem based assessment and plan for additional details.   Past Medical History:  Diagnosis Date   CHF (congestive heart failure) (HCC)    Hypertension    Hypertensive emergency 08/20/2022   Suicidal thoughts 09/08/2019    Current Outpatient Medications on File Prior to Visit  Medication Sig Dispense Refill   amLODipine (NORVASC) 10 MG tablet Take 1 tablet (10 mg total) by mouth daily. 180 tablet 3   Blood Pressure Monitor DEVI Use to check blood pressure daily 1 each 0   carvedilol (COREG) 25 MG tablet Take 1 tablet (25 mg total) by mouth 2 (two) times daily. 60 tablet 0   dapagliflozin propanediol (FARXIGA) 10 MG TABS tablet Take 1 tablet (10 mg total) by mouth daily before breakfast. 30 tablet 8   ferrous sulfate 325 (65 FE) MG tablet Take 1 tablet (325 mg total) by mouth daily with breakfast. (Patient not taking: Reported on 11/12/2022) 30 tablet 1   hydrALAZINE (APRESOLINE) 100 MG tablet Take 1 tablet (100 mg total) by mouth 3 (three) times daily. 90 tablet 0   isosorbide mononitrate (IMDUR) 60 MG 24 hr tablet Take 1.5 tablets (90 mg total) by mouth daily. 60 tablet 0   potassium chloride (KLOR-CON) 10 MEQ tablet Take 1 tablet (10 mEq total) by mouth daily. 90 tablet 3   sodium bicarbonate 650 MG tablet Take 1 tablet (650 mg total) by mouth 2 (two) times daily. 60 tablet 1   torsemide (DEMADEX) 10 MG tablet Take 2 tablets (20 mg total) by mouth daily. 60 tablet 2   No current facility-administered medications on file prior to visit.    Family History  Problem Relation Age of Onset   Diabetes Mother    Diabetes Father     Social History   Socioeconomic History   Marital status: Single    Spouse name: Not on file   Number of children: 1   Years of education: Not on file   Highest education level: High school  graduate  Occupational History   Occupation: [employer]  Tobacco Use   Smoking status: Former   Smokeless tobacco: Never  Advertising account planner   Vaping status: Never Used  Substance and Sexual Activity   Alcohol use: No   Drug use: No   Sexual activity: Not on file  Other Topics Concern   Not on file  Social History Narrative   Not on file   Social Determinants of Health   Financial Resource Strain: High Risk (09/12/2022)   Overall Financial Resource Strain (CARDIA)    Difficulty of Paying Living Expenses: Very hard  Food Insecurity: Food Insecurity Present (09/05/2022)   Hunger Vital Sign    Worried About Running Out of Food in the Last Year: Sometimes true    Ran Out of Food in the Last Year: Sometimes true  Transportation Needs: No Transportation Needs (09/05/2022)   PRAPARE - Administrator, Civil Service (Medical): No    Lack of Transportation (Non-Medical): No  Recent Concern: Transportation Needs - Unmet Transportation Needs (08/17/2022)   PRAPARE - Administrator, Civil Service (Medical): Yes    Lack of Transportation (Non-Medical): Yes  Physical Activity: Not on file  Stress: Not on file  Social Connections: Not on file  Intimate Partner Violence: Not At Risk (08/17/2022)   Humiliation, Afraid,  Rape, and Kick questionnaire    Fear of Current or Ex-Partner: No    Emotionally Abused: No    Physically Abused: No    Sexually Abused: No    Review of Systems: ROS negative except for what is noted on the assessment and plan.  Vitals:   11/15/22 1044  BP: 131/72  Pulse: 76  Resp: 20  Temp: 98 F (36.7 C)  TempSrc: Oral  SpO2: 100%  Weight: 213 lb 8 oz (96.8 kg)  Height: 5\' 11"  (1.803 m)    Physical Exam: Constitutional: well-appearing, no acute distress HENT: normocephalic atraumatic, mucous membranes moist Eyes: conjunctiva non-erythematous Cardiovascular: regular rate and rhythm, no m/r/g, no peripheral edema  Pulmonary/Chest: normal work  of breathing on room air, lungs clear to auscultation bilaterally Abdominal: soft, non-tender, non-distended MSK: normal bulk and tone Neurological: alert & oriented x 3, no focal deficit Skin: warm and dry Psych: normal mood and behavior  Assessment & Plan:     Patient seen with Dr. Antony Contras  Multifocal pneumonia He presented to the ED with worsening shortness of breath, cough, and clear sputum for 2 days and was found to have community-acquired multifocal pneumonia.  He was started on a 14-day course of Augmentin.  Today he said he had 8 pills left and notes that he has not missed any doses.  I let the patient know that he does not need to take the pills anymore as his symptoms have resolved.  He denies any shortness of breath, cough, sputum production, or other signs or symptoms. - Discontinue Augmentin  Hypertension Patient was noted to have elevated blood pressure throughout his hospital course and at his cardiology follow-up appointments.  Today, his blood pressure was normal at 131/72.  He has been taking his blood pressure at home and yesterday, his blood pressure was 132/89.  He states that since being discharged, he has been taking his medications regularly.  He denies any headaches, vision changes, or other signs or symptoms. - Continue medication regimen   Heart failure with mildly reduced ejection fraction (HFmrEF) (HCC) Patient's electrocardiogram demonstrated severe LVH.  He was last seen by cardiology on 8/26 and was noted to still have an elevated blood pressure.  Cardiology started him on amlodipine 10 mg which has significantly improved his BP.  Today, he was euvolemic.  He denies any orthopnea or other signs or symptoms. - Follow-up renal artery Doppler ordered on 8/26 - Follow-up on cards MRI scheduled for 9/4 - Continue amlodipine 10 mg  CKD (chronic kidney disease) stage 4, GFR 15-29 ml/min (HCC) Last BMP on 11/12/22 showed a GFR of 19, which is his baseline.  It  also showed a creatinine of 3.89, which also appears to be his new baseline.  The patient noted that he was contacted by nephrology but they were unable to schedule a visit or hoping to begin discussions with dialysis.  Will continue to avoid nephrotoxic medications. - Follow-up with nephrology  OSA (obstructive sleep apnea) Recently completed his sleep study that showed patient had an AHI of 97.1/hour which is consistent with severe OSA.  Patient noted that he has not yet been contacted for follow-up and initiation of his CPAP therapy.  We will reach out to the cardiologist to begin this process. - Follow-up with cardiology to begin next steps of his CPAP therapy  Depression Patient noted to have a history consistent with major depressive disorder.  He denies any SI or HI today.  PHQ-9 today was 17 which  is slightly down from his score of 2 weeks ago.  Patient denied any history of mania, schizophrenia, or other contraindications for being antidepressant therapy outlined below. - Begin Zoloft 50 mg - Follow-up with behavioral therapy - Follow-up in 4 weeks to reevaluate signs and symptoms and medication tolerability  Morrie Sheldon, MD  [facility] Internal Medicine, PGY-1 Phone: [phone] Date 11/15/2022 Time 1:29 PM

## 2022-11-15 NOTE — Assessment & Plan Note (Signed)
Recently completed his sleep study that showed patient had an AHI of 97.1/hour which is consistent with severe OSA.  Patient noted that he has not yet been contacted for follow-up and initiation of his CPAP therapy.  We will reach out to the cardiologist to begin this process. - Follow-up with cardiology to begin next steps of his CPAP therapy

## 2022-11-15 NOTE — Assessment & Plan Note (Addendum)
He presented to the ED with worsening shortness of breath, cough, and clear sputum for 2 days and was found to have community-acquired multifocal pneumonia.  He was started on a 14-day course of Augmentin.  Today he said he had 8 pills left and notes that he has not missed any doses.  I let the patient know that he does not need to take the pills anymore as his symptoms have resolved.  He denies any shortness of breath, cough, sputum production, or other signs or symptoms. - Discontinue Augmentin

## 2022-11-15 NOTE — Assessment & Plan Note (Addendum)
Patient's electrocardiogram demonstrated severe LVH.  He was last seen by cardiology on 8/26 and was noted to still have an elevated blood pressure.  Cardiology started him on amlodipine 10 mg which has significantly improved his BP.  Today, he was euvolemic.  He denies any orthopnea or other signs or symptoms. - Follow-up renal artery Doppler ordered on 8/26 - Follow-up on cards MRI scheduled for 9/4 - Continue amlodipine 10 mg

## 2022-11-15 NOTE — Assessment & Plan Note (Signed)
Patient noted to have a history consistent with major depressive disorder.  He denies any SI or HI today.  PHQ-9 today was 17 which is slightly down from his score of 2 weeks ago.  Patient denied any history of mania, schizophrenia, or other contraindications for being antidepressant therapy outlined below. - Begin Zoloft 50 mg - Follow-up with behavioral therapy - Follow-up in 4 weeks to reevaluate signs and symptoms and medication tolerability

## 2022-11-15 NOTE — Patient Instructions (Signed)
Thank you so much for coming to the clinic today!   For your pneumonia, you do not need to take your pills any longer.  You can dispose of the remaining pills safely as your symptoms have resolved.  For your decreased mood, I started Zoloft 50 mg.  Please take this daily and let us know if you experience any new side effects.  I have also sent in a referral for behavioral health so they will be in touch.  For your sleep apnea, I will message the healthcare team to begin the process of receiving CPAP therapy.  Furthermore, for your CKD, we will reach out to the kidney doctors to schedule you for a visit with them.  If you have any questions please feel free to the call the clinic at anytime at [phone]. It was a pleasure seeing you!  Best, Dr. Rayvon Char

## 2022-11-16 NOTE — Progress Notes (Signed)
Internal Medicine Clinic Attending  I was physically present during the key portions of the resident provided service and participated in the medical decision making of patient's management care. I reviewed pertinent patient test results.  The assessment, diagnosis, and plan were formulated together and I agree with the documentation in the resident's note.  Guilloud, Carolyn, MD  

## 2022-11-18 DIAGNOSIS — Z419 Encounter for procedure for purposes other than remedying health state, unspecified: Secondary | ICD-10-CM | POA: Diagnosis not present

## 2022-11-20 ENCOUNTER — Telehealth (HOSPITAL_COMMUNITY): Payer: Self-pay | Admitting: *Deleted

## 2022-11-20 NOTE — Telephone Encounter (Signed)
 Attempted to call patient regarding upcoming cardiac MRI appointment. Left message on voicemail with name and callback number Johney Frame RN Navigator Cardiac Imaging [facility] [phone] Office

## 2022-11-21 ENCOUNTER — Ambulatory Visit (HOSPITAL_COMMUNITY): Admission: RE | Admit: 2022-11-21 | Payer: Commercial Managed Care - HMO | Source: Ambulatory Visit

## 2022-11-21 ENCOUNTER — Other Ambulatory Visit (HOSPITAL_COMMUNITY): Payer: Commercial Managed Care - HMO

## 2022-11-26 ENCOUNTER — Other Ambulatory Visit: Payer: Self-pay

## 2022-11-26 ENCOUNTER — Institutional Professional Consult (permissible substitution): Payer: Commercial Managed Care - HMO | Admitting: Pulmonary Disease

## 2022-11-26 ENCOUNTER — Ambulatory Visit (HOSPITAL_COMMUNITY): Payer: Commercial Managed Care - HMO | Attending: Cardiology

## 2022-11-26 ENCOUNTER — Other Ambulatory Visit (HOSPITAL_COMMUNITY): Payer: Self-pay | Admitting: Cardiology

## 2022-11-26 DIAGNOSIS — I5020 Unspecified systolic (congestive) heart failure: Secondary | ICD-10-CM

## 2022-11-26 DIAGNOSIS — I340 Nonrheumatic mitral (valve) insufficiency: Secondary | ICD-10-CM

## 2022-11-26 DIAGNOSIS — N184 Chronic kidney disease, stage 4 (severe): Secondary | ICD-10-CM

## 2022-11-26 DIAGNOSIS — I5022 Chronic systolic (congestive) heart failure: Secondary | ICD-10-CM

## 2022-11-26 DIAGNOSIS — I10 Essential (primary) hypertension: Secondary | ICD-10-CM

## 2022-11-26 DIAGNOSIS — G4733 Obstructive sleep apnea (adult) (pediatric): Secondary | ICD-10-CM

## 2022-11-26 NOTE — Progress Notes (Deleted)
Synopsis: Referred in *** for *** by Lovie Macadamia, MD  Subjective:   PATIENT ID: Mark Hayden GENDER: male DOB: 09-04-1975, MRN: [id number]  No chief complaint on file.   HPI  ***  Past Medical History:  Diagnosis Date   CHF (congestive heart failure) (HCC)    Hypertension    Hypertensive emergency 08/20/2022   Suicidal thoughts 09/08/2019     Family History  Problem Relation Age of Onset   Diabetes Mother    Diabetes Father      No past surgical history on file.  Social History   Socioeconomic History   Marital status: Single    Spouse name: Not on file   Number of children: 1   Years of education: Not on file   Highest education level: High school graduate  Occupational History   Occupation: [employer]  Tobacco Use   Smoking status: Former   Smokeless tobacco: Never  Advertising account planner   Vaping status: Never Used  Substance and Sexual Activity   Alcohol use: No   Drug use: No   Sexual activity: Not on file  Other Topics Concern   Not on file  Social History Narrative   Not on file   Social Determinants of Health   Financial Resource Strain: High Risk (09/12/2022)   Overall Financial Resource Strain (CARDIA)    Difficulty of Paying Living Expenses: Very hard  Food Insecurity: Food Insecurity Present (09/05/2022)   Hunger Vital Sign    Worried About Running Out of Food in the Last Year: Sometimes true    Ran Out of Food in the Last Year: Sometimes true  Transportation Needs: No Transportation Needs (09/05/2022)   PRAPARE - Administrator, Civil Service (Medical): No    Lack of Transportation (Non-Medical): No  Recent Concern: Transportation Needs - Unmet Transportation Needs (08/17/2022)   PRAPARE - Administrator, Civil Service (Medical): Yes    Lack of Transportation (Non-Medical): Yes  Physical Activity: Not on file  Stress: Not on file  Social Connections: Not on file  Intimate Partner Violence: Not At Risk  (08/17/2022)   Humiliation, Afraid, Rape, and Kick questionnaire    Fear of Current or Ex-Partner: No    Emotionally Abused: No    Physically Abused: No    Sexually Abused: No     Allergies  Allergen Reactions   Shellfish Allergy Anaphylaxis and Itching    seafood     Outpatient Medications Prior to Visit  Medication Sig Dispense Refill   amLODipine (NORVASC) 10 MG tablet Take 1 tablet (10 mg total) by mouth daily. 180 tablet 3   Blood Pressure Monitor DEVI Use to check blood pressure daily 1 each 0   carvedilol (COREG) 25 MG tablet Take 1 tablet (25 mg total) by mouth 2 (two) times daily. 60 tablet 0   dapagliflozin propanediol (FARXIGA) 10 MG TABS tablet Take 1 tablet (10 mg total) by mouth daily before breakfast. 30 tablet 8   ferrous sulfate 325 (65 FE) MG tablet Take 1 tablet (325 mg total) by mouth daily with breakfast. (Patient not taking: Reported on 11/12/2022) 30 tablet 1   hydrALAZINE (APRESOLINE) 100 MG tablet Take 1 tablet (100 mg total) by mouth 3 (three) times daily. 90 tablet 0   isosorbide mononitrate (IMDUR) 60 MG 24 hr tablet Take 1.5 tablets (90 mg total) by mouth daily. 60 tablet 0   potassium chloride (KLOR-CON) 10 MEQ tablet Take 1 tablet (10 mEq total) by  mouth daily. 90 tablet 3   sertraline (ZOLOFT) 50 MG tablet Take 1 tablet (50 mg total) by mouth daily. 30 tablet 2   sodium bicarbonate 650 MG tablet Take 1 tablet (650 mg total) by mouth 2 (two) times daily. 60 tablet 1   torsemide (DEMADEX) 10 MG tablet Take 2 tablets (20 mg total) by mouth daily. 60 tablet 2   No facility-administered medications prior to visit.    ROS   Objective:  Physical Exam   There were no vitals filed for this visit.   on *** LPM *** RA BMI Readings from Last 3 Encounters:  11/15/22 29.78 kg/m  11/12/22 29.18 kg/m  11/02/22 29.82 kg/m   Wt Readings from Last 3 Encounters:  11/15/22 213 lb 8 oz (96.8 kg)  11/12/22 209 lb 3.2 oz (94.9 kg)  11/02/22 213 lb 12.8 oz (97  kg)     CBC    Component Value Date/Time   WBC 6.8 10/29/2022 0019   RBC 3.40 (L) 10/29/2022 0019   HGB 10.3 (L) 10/29/2022 0019   HCT 31.2 (L) 10/29/2022 0019   PLT 163 10/29/2022 0019   MCV 91.8 10/29/2022 0019   MCH 30.3 10/29/2022 0019   MCHC 33.0 10/29/2022 0019   RDW 13.1 10/29/2022 0019   LYMPHSABS 0.9 10/28/2022 0936   MONOABS 0.5 10/28/2022 0936   EOSABS 0.3 10/28/2022 0936   BASOSABS 0.1 10/28/2022 0936    ***  Chest Imaging: ***  Pulmonary Functions Testing Results:     No data to display          FeNO: ***  Pathology: ***  Echocardiogram: ***  Heart Catheterization: ***    Assessment & Plan:   No diagnosis found.  Discussion: ***   Current Outpatient Medications:    amLODipine (NORVASC) 10 MG tablet, Take 1 tablet (10 mg total) by mouth daily., Disp: 180 tablet, Rfl: 3   Blood Pressure Monitor DEVI, Use to check blood pressure daily, Disp: 1 each, Rfl: 0   carvedilol (COREG) 25 MG tablet, Take 1 tablet (25 mg total) by mouth 2 (two) times daily., Disp: 60 tablet, Rfl: 0   dapagliflozin propanediol (FARXIGA) 10 MG TABS tablet, Take 1 tablet (10 mg total) by mouth daily before breakfast., Disp: 30 tablet, Rfl: 8   ferrous sulfate 325 (65 FE) MG tablet, Take 1 tablet (325 mg total) by mouth daily with breakfast. (Patient not taking: Reported on 11/12/2022), Disp: 30 tablet, Rfl: 1   hydrALAZINE (APRESOLINE) 100 MG tablet, Take 1 tablet (100 mg total) by mouth 3 (three) times daily., Disp: 90 tablet, Rfl: 0   isosorbide mononitrate (IMDUR) 60 MG 24 hr tablet, Take 1.5 tablets (90 mg total) by mouth daily., Disp: 60 tablet, Rfl: 0   potassium chloride (KLOR-CON) 10 MEQ tablet, Take 1 tablet (10 mEq total) by mouth daily., Disp: 90 tablet, Rfl: 3   sertraline (ZOLOFT) 50 MG tablet, Take 1 tablet (50 mg total) by mouth daily., Disp: 30 tablet, Rfl: 2   sodium bicarbonate 650 MG tablet, Take 1 tablet (650 mg total) by mouth 2 (two) times daily., Disp:  60 tablet, Rfl: 1   torsemide (DEMADEX) 10 MG tablet, Take 2 tablets (20 mg total) by mouth daily., Disp: 60 tablet, Rfl: 2  I spent *** minutes dedicated to the care of this patient on the date of this encounter to include pre-visit review of records, face-to-face time with the patient discussing conditions above, post visit ordering of testing, clinical documentation with  the electronic health record, making appropriate referrals as documented, and communicating necessary findings to members of the patients care team.   Josephine Igo, DO [facility] 11/26/2022 8:40 AM

## 2022-12-03 ENCOUNTER — Encounter: Payer: Self-pay | Admitting: Pulmonary Disease

## 2022-12-13 ENCOUNTER — Telehealth: Payer: Self-pay

## 2022-12-13 NOTE — Telephone Encounter (Signed)
-----   Message from Armanda Magic sent at 11/14/2022 11:33 AM EDT ----- Please let patient know that they have sleep apnea.  Recommend therapeutic CPAP titration for treatment of patient's sleep disordered breathing.  If unable to perform an in lab titration then initiate ResMed auto CPAP from 4 to 15cm H2O with heated humidity and mask of choice and overnight pulse ox on CPAP.

## 2022-12-13 NOTE — Telephone Encounter (Signed)
 Left VM with callback number for patient to receive sleep study results and recommendations.

## 2022-12-17 ENCOUNTER — Encounter: Payer: Commercial Managed Care - HMO | Admitting: Student

## 2022-12-18 ENCOUNTER — Ambulatory Visit (INDEPENDENT_AMBULATORY_CARE_PROVIDER_SITE_OTHER): Payer: Self-pay | Admitting: Licensed Clinical Social Worker

## 2022-12-18 DIAGNOSIS — F32A Depression, unspecified: Secondary | ICD-10-CM

## 2023-01-07 ENCOUNTER — Inpatient Hospital Stay (HOSPITAL_COMMUNITY)
Admission: EM | Admit: 2023-01-07 | Discharge: 2023-01-11 | DRG: 304 | Disposition: A | Discharge status: Home / Self Care | Payer: Commercial Managed Care - HMO | Source: Ambulatory Visit | Attending: Infectious Diseases | Admitting: Infectious Diseases

## 2023-01-07 ENCOUNTER — Ambulatory Visit (INDEPENDENT_AMBULATORY_CARE_PROVIDER_SITE_OTHER): Payer: Managed Care, Other (non HMO)

## 2023-01-07 ENCOUNTER — Observation Stay (HOSPITAL_COMMUNITY): Payer: Commercial Managed Care - HMO

## 2023-01-07 ENCOUNTER — Other Ambulatory Visit: Payer: Self-pay

## 2023-01-07 ENCOUNTER — Ambulatory Visit (HOSPITAL_COMMUNITY)
Admission: EM | Admit: 2023-01-07 | Discharge: 2023-01-07 | Disposition: A | Discharge status: Home / Self Care | Payer: Managed Care, Other (non HMO) | Attending: Family Medicine | Admitting: Family Medicine

## 2023-01-07 ENCOUNTER — Encounter (HOSPITAL_COMMUNITY): Payer: Self-pay | Admitting: Internal Medicine

## 2023-01-07 ENCOUNTER — Other Ambulatory Visit (HOSPITAL_COMMUNITY): Payer: Managed Care, Other (non HMO)

## 2023-01-07 ENCOUNTER — Emergency Department (HOSPITAL_COMMUNITY): Payer: Commercial Managed Care - HMO

## 2023-01-07 ENCOUNTER — Encounter (HOSPITAL_COMMUNITY): Payer: Self-pay | Admitting: Emergency Medicine

## 2023-01-07 DIAGNOSIS — R809 Proteinuria, unspecified: Secondary | ICD-10-CM | POA: Diagnosis present

## 2023-01-07 DIAGNOSIS — I161 Hypertensive emergency: Principal | ICD-10-CM | POA: Diagnosis present

## 2023-01-07 DIAGNOSIS — Z66 Do not resuscitate: Secondary | ICD-10-CM | POA: Diagnosis present

## 2023-01-07 DIAGNOSIS — Z91148 Patient's other noncompliance with medication regimen for other reason: Secondary | ICD-10-CM

## 2023-01-07 DIAGNOSIS — R0989 Other specified symptoms and signs involving the circulatory and respiratory systems: Secondary | ICD-10-CM | POA: Diagnosis present

## 2023-01-07 DIAGNOSIS — R0602 Shortness of breath: Secondary | ICD-10-CM | POA: Diagnosis not present

## 2023-01-07 DIAGNOSIS — Z79899 Other long term (current) drug therapy: Secondary | ICD-10-CM

## 2023-01-07 DIAGNOSIS — I159 Secondary hypertension, unspecified: Secondary | ICD-10-CM | POA: Diagnosis present

## 2023-01-07 DIAGNOSIS — I169 Hypertensive crisis, unspecified: Secondary | ICD-10-CM

## 2023-01-07 DIAGNOSIS — N179 Acute kidney failure, unspecified: Secondary | ICD-10-CM | POA: Diagnosis present

## 2023-01-07 DIAGNOSIS — I2489 Other forms of acute ischemic heart disease: Secondary | ICD-10-CM | POA: Diagnosis present

## 2023-01-07 DIAGNOSIS — I130 Hypertensive heart and chronic kidney disease with heart failure and stage 1 through stage 4 chronic kidney disease, or unspecified chronic kidney disease: Secondary | ICD-10-CM | POA: Diagnosis present

## 2023-01-07 DIAGNOSIS — I160 Hypertensive urgency: Secondary | ICD-10-CM | POA: Diagnosis not present

## 2023-01-07 DIAGNOSIS — Z91013 Allergy to seafood: Secondary | ICD-10-CM

## 2023-01-07 DIAGNOSIS — I5043 Acute on chronic combined systolic (congestive) and diastolic (congestive) heart failure: Secondary | ICD-10-CM | POA: Diagnosis present

## 2023-01-07 DIAGNOSIS — I951 Orthostatic hypotension: Secondary | ICD-10-CM | POA: Diagnosis present

## 2023-01-07 DIAGNOSIS — J189 Pneumonia, unspecified organism: Principal | ICD-10-CM | POA: Diagnosis present

## 2023-01-07 DIAGNOSIS — R0682 Tachypnea, not elsewhere classified: Secondary | ICD-10-CM

## 2023-01-07 DIAGNOSIS — I771 Stricture of artery: Secondary | ICD-10-CM | POA: Diagnosis present

## 2023-01-07 DIAGNOSIS — J810 Acute pulmonary edema: Secondary | ICD-10-CM

## 2023-01-07 DIAGNOSIS — Z841 Family history of disorders of kidney and ureter: Secondary | ICD-10-CM

## 2023-01-07 DIAGNOSIS — I422 Other hypertrophic cardiomyopathy: Secondary | ICD-10-CM | POA: Diagnosis present

## 2023-01-07 DIAGNOSIS — Z87891 Personal history of nicotine dependence: Secondary | ICD-10-CM

## 2023-01-07 DIAGNOSIS — F32A Depression, unspecified: Secondary | ICD-10-CM | POA: Diagnosis present

## 2023-01-07 DIAGNOSIS — G4733 Obstructive sleep apnea (adult) (pediatric): Secondary | ICD-10-CM | POA: Diagnosis present

## 2023-01-07 DIAGNOSIS — Z8249 Family history of ischemic heart disease and other diseases of the circulatory system: Secondary | ICD-10-CM

## 2023-01-07 DIAGNOSIS — R519 Headache, unspecified: Secondary | ICD-10-CM | POA: Diagnosis not present

## 2023-01-07 DIAGNOSIS — I10 Essential (primary) hypertension: Secondary | ICD-10-CM

## 2023-01-07 DIAGNOSIS — K559 Vascular disorder of intestine, unspecified: Secondary | ICD-10-CM

## 2023-01-07 DIAGNOSIS — F1290 Cannabis use, unspecified, uncomplicated: Secondary | ICD-10-CM | POA: Diagnosis present

## 2023-01-07 DIAGNOSIS — Z1152 Encounter for screening for COVID-19: Secondary | ICD-10-CM

## 2023-01-07 DIAGNOSIS — N184 Chronic kidney disease, stage 4 (severe): Secondary | ICD-10-CM | POA: Diagnosis present

## 2023-01-07 DIAGNOSIS — Z833 Family history of diabetes mellitus: Secondary | ICD-10-CM

## 2023-01-07 HISTORY — DX: Sleep apnea, unspecified: G47.30

## 2023-01-07 HISTORY — DX: Pneumonia, unspecified organism: J18.9

## 2023-01-07 LAB — RESP PANEL BY RT-PCR (RSV, FLU A&B, COVID)  RVPGX2
Influenza A by PCR: NEGATIVE
Influenza B by PCR: NEGATIVE
Resp Syncytial Virus by PCR: NEGATIVE
SARS Coronavirus 2 by RT PCR: NEGATIVE

## 2023-01-07 LAB — CBC
HCT: 29.9 % — ABNORMAL LOW (ref 39.0–52.0)
Hemoglobin: 10 g/dL — ABNORMAL LOW (ref 13.0–17.0)
MCH: 30.5 pg (ref 26.0–34.0)
MCHC: 33.4 g/dL (ref 30.0–36.0)
MCV: 91.2 fL (ref 80.0–100.0)
Platelets: 162 10*3/uL (ref 150–400)
RBC: 3.28 MIL/uL — ABNORMAL LOW (ref 4.22–5.81)
RDW: 13.2 % (ref 11.5–15.5)
WBC: 6 10*3/uL (ref 4.0–10.5)
nRBC: 0 % (ref 0.0–0.2)

## 2023-01-07 LAB — CREATININE, SERUM
Creatinine, Ser: 4.11 mg/dL — ABNORMAL HIGH (ref 0.61–1.24)
GFR, Estimated: 17 mL/min — ABNORMAL LOW (ref >60)

## 2023-01-07 LAB — CBC WITH DIFFERENTIAL/PLATELET
Abs Immature Granulocytes: 0.02 10*3/uL (ref 0.00–0.07)
Basophils Absolute: 0.1 10*3/uL (ref 0.0–0.1)
Basophils Relative: 1 %
Eosinophils Absolute: 0.3 10*3/uL (ref 0.0–0.5)
Eosinophils Relative: 4 %
HCT: 34 % — ABNORMAL LOW (ref 39.0–52.0)
Hemoglobin: 11.5 g/dL — ABNORMAL LOW (ref 13.0–17.0)
Immature Granulocytes: 0 %
Lymphocytes Relative: 14 %
Lymphs Abs: 1.1 10*3/uL (ref 0.7–4.0)
MCH: 30.5 pg (ref 26.0–34.0)
MCHC: 33.8 g/dL (ref 30.0–36.0)
MCV: 90.2 fL (ref 80.0–100.0)
Monocytes Absolute: 0.6 10*3/uL (ref 0.1–1.0)
Monocytes Relative: 8 %
Neutro Abs: 5.4 10*3/uL (ref 1.7–7.7)
Neutrophils Relative %: 73 %
Platelets: 197 10*3/uL (ref 150–400)
RBC: 3.77 MIL/uL — ABNORMAL LOW (ref 4.22–5.81)
RDW: 13.2 % (ref 11.5–15.5)
WBC: 7.4 10*3/uL (ref 4.0–10.5)
nRBC: 0 % (ref 0.0–0.2)

## 2023-01-07 LAB — I-STAT CHEM 8, ED
BUN: 45 mg/dL — ABNORMAL HIGH (ref 6–20)
Calcium, Ion: 1.16 mmol/L (ref 1.15–1.40)
Chloride: 110 mmol/L (ref 98–111)
Creatinine, Ser: 4.5 mg/dL — ABNORMAL HIGH (ref 0.61–1.24)
Glucose, Bld: 85 mg/dL (ref 70–99)
HCT: 34 % — ABNORMAL LOW (ref 39.0–52.0)
Hemoglobin: 11.6 g/dL — ABNORMAL LOW (ref 13.0–17.0)
Potassium: 3.8 mmol/L (ref 3.5–5.1)
Sodium: 143 mmol/L (ref 135–145)
TCO2: 20 mmol/L — ABNORMAL LOW (ref 22–32)

## 2023-01-07 LAB — TROPONIN I (HIGH SENSITIVITY)
Troponin I (High Sensitivity): 252 ng/L (ref <18)
Troponin I (High Sensitivity): 208 ng/L (ref <18)

## 2023-01-07 LAB — URINALYSIS, ROUTINE W REFLEX MICROSCOPIC
Bacteria, UA: NONE SEEN
Bilirubin Urine: NEGATIVE
Glucose, UA: 50 mg/dL — AB
Ketones, ur: NEGATIVE mg/dL
Leukocytes,Ua: NEGATIVE
Nitrite: NEGATIVE
Protein, ur: 100 mg/dL — AB
Specific Gravity, Urine: 1.017 (ref 1.005–1.030)
pH: 5 (ref 5.0–8.0)

## 2023-01-07 LAB — HEPATIC FUNCTION PANEL
ALT: 18 U/L (ref 0–44)
AST: 22 U/L (ref 15–41)
Albumin: 3.6 g/dL (ref 3.5–5.0)
Alkaline Phosphatase: 85 U/L (ref 38–126)
Bilirubin, Direct: 0.1 mg/dL (ref 0.0–0.2)
Indirect Bilirubin: 0.7 mg/dL (ref 0.3–0.9)
Total Bilirubin: 0.8 mg/dL (ref 0.3–1.2)
Total Protein: 7.2 g/dL (ref 6.5–8.1)

## 2023-01-07 LAB — MAGNESIUM: Magnesium: 2 mg/dL (ref 1.7–2.4)

## 2023-01-07 LAB — BASIC METABOLIC PANEL
Anion gap: 9 (ref 5–15)
BUN: 52 mg/dL — ABNORMAL HIGH (ref 6–20)
CO2: 18 mmol/L — ABNORMAL LOW (ref 22–32)
Calcium: 8.1 mg/dL — ABNORMAL LOW (ref 8.9–10.3)
Chloride: 110 mmol/L (ref 98–111)
Creatinine, Ser: 4.1 mg/dL — ABNORMAL HIGH (ref 0.61–1.24)
GFR, Estimated: 17 mL/min — ABNORMAL LOW (ref >60)
Glucose, Bld: 85 mg/dL (ref 70–99)
Potassium: 3.7 mmol/L (ref 3.5–5.1)
Sodium: 137 mmol/L (ref 135–145)

## 2023-01-07 LAB — CK: Total CK: 365 U/L (ref 49–397)

## 2023-01-07 LAB — BRAIN NATRIURETIC PEPTIDE: B Natriuretic Peptide: 647.2 pg/mL — ABNORMAL HIGH (ref 0.0–100.0)

## 2023-01-07 LAB — PROCALCITONIN: Procalcitonin: <0.1 ng/mL

## 2023-01-07 MED ORDER — DAPAGLIFLOZIN PROPANEDIOL 10 MG PO TABS
10.0000 mg | ORAL_TABLET | Freq: Every day | ORAL | Status: DC
Start: 2023-01-07 — End: 2023-01-11
  Administered 2023-01-07 – 2023-01-11 (×5): 10 mg via ORAL
  Filled 2023-01-07 (×5): qty 1

## 2023-01-07 MED ORDER — SERTRALINE HCL 50 MG PO TABS
50.0000 mg | ORAL_TABLET | Freq: Every day | ORAL | Status: DC
  Administered 2023-01-07 – 2023-01-11 (×5): 50 mg via ORAL
  Filled 2023-01-07 (×5): qty 1

## 2023-01-07 MED ORDER — SODIUM CHLORIDE 0.9% FLUSH
3.0000 mL | Freq: Two times a day (BID) | INTRAVENOUS | Status: DC
  Administered 2023-01-07 – 2023-01-11 (×6): 3 mL via INTRAVENOUS

## 2023-01-07 MED ORDER — HEPARIN SODIUM (PORCINE) 5000 UNIT/ML IJ SOLN
5000.0000 [IU] | Freq: Three times a day (TID) | INTRAMUSCULAR | Status: DC
  Administered 2023-01-07 – 2023-01-11 (×12): 5000 [IU] via SUBCUTANEOUS
  Filled 2023-01-07 (×12): qty 1

## 2023-01-07 MED ORDER — LABETALOL HCL 5 MG/ML IV SOLN
10.0000 mg | Freq: Once | INTRAVENOUS | Status: AC
  Administered 2023-01-07: 10 mg via INTRAVENOUS
  Filled 2023-01-07: qty 4

## 2023-01-07 MED ORDER — SODIUM CHLORIDE 0.9 % IV SOLN
1.0000 g | Freq: Once | INTRAVENOUS | Status: AC
  Administered 2023-01-07: 1 g via INTRAVENOUS
  Filled 2023-01-07: qty 10

## 2023-01-07 MED ORDER — SENNOSIDES-DOCUSATE SODIUM 8.6-50 MG PO TABS: 1.0000 | ORAL_TABLET | Freq: Every evening | ORAL | Status: DC | PRN

## 2023-01-07 MED ORDER — AZITHROMYCIN 500 MG IV SOLR
500.0000 mg | Freq: Once | INTRAVENOUS | Status: AC
  Administered 2023-01-07: 500 mg via INTRAVENOUS
  Filled 2023-01-07: qty 5

## 2023-01-07 MED ORDER — ACETAMINOPHEN 325 MG PO TABS
650.0000 mg | ORAL_TABLET | Freq: Four times a day (QID) | ORAL | Status: DC | PRN
  Administered 2023-01-07 – 2023-01-08 (×2): 650 mg via ORAL
  Filled 2023-01-07 (×2): qty 2

## 2023-01-07 MED ORDER — HYDRALAZINE HCL 50 MG PO TABS
50.0000 mg | ORAL_TABLET | Freq: Three times a day (TID) | ORAL | Status: DC
  Administered 2023-01-07 – 2023-01-08 (×3): 50 mg via ORAL
  Filled 2023-01-07 (×3): qty 1

## 2023-01-07 MED ORDER — CARVEDILOL 3.125 MG PO TABS
6.2500 mg | ORAL_TABLET | Freq: Two times a day (BID) | ORAL | Status: DC
  Administered 2023-01-08: 6.25 mg via ORAL
  Filled 2023-01-07: qty 2

## 2023-01-07 MED ORDER — NITROGLYCERIN IN D5W 200-5 MCG/ML-% IV SOLN
0.0000 ug/min | INTRAVENOUS | Status: DC
Start: 2023-01-07 — End: 2023-01-09
  Administered 2023-01-07: 50 ug/min via INTRAVENOUS
  Administered 2023-01-08: 25 ug/min via INTRAVENOUS
  Administered 2023-01-08: 50 ug/min via INTRAVENOUS
  Administered 2023-01-08: 75 ug/min via INTRAVENOUS
  Administered 2023-01-08: 100 ug/min via INTRAVENOUS
  Filled 2023-01-07 (×3): qty 250

## 2023-01-07 MED ORDER — ACETAMINOPHEN 650 MG RE SUPP: 650.0000 mg | Freq: Four times a day (QID) | RECTAL | Status: DC | PRN

## 2023-01-07 MED ORDER — LABETALOL HCL 5 MG/ML IV SOLN
20.0000 mg | Freq: Once | INTRAVENOUS | Status: AC
  Administered 2023-01-07: 20 mg via INTRAVENOUS
  Filled 2023-01-07: qty 4

## 2023-01-07 MED ORDER — FUROSEMIDE 10 MG/ML IJ SOLN
40.0000 mg | Freq: Once | INTRAMUSCULAR | Status: AC
  Administered 2023-01-07: 40 mg via INTRAVENOUS
  Filled 2023-01-07: qty 4

## 2023-01-07 MED ORDER — SODIUM BICARBONATE 650 MG PO TABS
650.0000 mg | ORAL_TABLET | Freq: Two times a day (BID) | ORAL | Status: DC
  Administered 2023-01-07 – 2023-01-11 (×8): 650 mg via ORAL
  Filled 2023-01-07 (×8): qty 1

## 2023-01-07 MED ORDER — AMLODIPINE BESYLATE 10 MG PO TABS
10.0000 mg | ORAL_TABLET | Freq: Every day | ORAL | Status: DC
  Administered 2023-01-07 – 2023-01-11 (×5): 10 mg via ORAL
  Filled 2023-01-07: qty 2
  Filled 2023-01-07: qty 1
  Filled 2023-01-07: qty 2
  Filled 2023-01-07 (×2): qty 1

## 2023-01-07 NOTE — ED Notes (Signed)
[facility] currently made aware to transport pt.

## 2023-01-07 NOTE — H&P (Cosign Needed Addendum)
Date: 01/07/2023               Patient Name:  Mark Hayden MRN: [id number]  DOB: 07-26-1975 Age / Sex: 47 y.o., male   PCP: Lovie Macadamia, MD              Medical Service: Internal Medicine Teaching Service              Attending Physician: Dr. Johny Sax, MD    First Contact: [name], MS 4 Pager: [phone]  Second Contact: Dr. [name], MD Pager: [phone]                       Chief Complaint: shortness of breath and cough  History of Present Illness: Patient is a 47 year old male with PMH of CHF (EF 45-50%), CKD Stage IV, HTN, depression and OSA who presented with shortness of breath and productive cough. He initially went to urgent care and was found to have hypertensive emergency with BP of 230/137. CXR showed LLL opacities. Patient stated that two weeks ago he began having productive cough with one episode of blood in the sputum, shortness of breath that gradually increased, and chills. Last night he began having chest pain and trouble sleeping.  Chest pain resolved this morning.He reports having apneic episodes at night and had a sleep study completed two months ago where he was found to have OSA. He does not use a CPAP or BiPAP machine at home. He was hospitalized two months ago for multifocal pneumonia and treated with Augmentin 14 days. Patient states that he has not taken any of his home medications for 1-2 weeks and believes that his amlodipine makes him dizzy, diaphoretic, and does not allow him to sleep at night. Denies any current chest pain, abdominal pain, headache, shortness of breath or blurry vision. Endorses chronic nausea and productive cough.  Past Medical History:  CHF with mildly reduced EF of 40-45% HTN Depression CKD Stage IV OSA  Meds:  Patient admits to not taking home medications for 1-2 weeks. Patient had home medications with him and reviewed together.  Amlodipine 10 Sodium Bicarb 650 BID Torsemide 10 2  tablets Imdur 60 Hydralazine 100 TID Sertraline 50 Potassium Chloride 10 Not taking SGTL2i  Allergies: Allergies as of 01/07/2023 - Review Complete 01/07/2023  Allergen Reaction Noted   Shellfish allergy Anaphylaxis and Itching 09/22/2013    Family History:  Mother-CKD and HTN Sister-HTN  Social History:  Patient lives at home and is independent with ADLs; works as Public affairs consultant He quit smoking 8 years ago and started smoking when he was 63 with 1/4 ppd Denies alcohol use Smokes marijuana daily  Surgical History: NA  Review of Systems: A complete ROS was negative except as per HPI.   Physical Exam: Blood pressure (!) 188/121, pulse 84, temperature 98.6 F (37 C), resp. rate (!) 32, SpO2 100%.  Physical Exam Constitutional:      General: He is not in acute distress.    Appearance: He is not ill-appearing.  HENT:     Head: Normocephalic and atraumatic.     Mouth/Throat:     Mouth: Mucous membranes are moist.  Cardiovascular:     Rate and Rhythm: Regular rhythm. Tachycardia present.     Pulses: Normal pulses.  Pulmonary:     Effort: Pulmonary effort is normal.     Breath sounds: Normal breath sounds.  Abdominal:     General: Abdomen is flat. Bowel sounds are normal.  Musculoskeletal:     Right lower leg: No edema.     Left lower leg: No edema.  Skin:    General: Skin is warm.  Neurological:     Mental Status: He is alert and oriented to person, place, and time.     Cranial Nerves: No cranial nerve deficit.      EKG:  Sinus rhythm LAE, consider biatrial enlargement LVH with secondary repolarization abnormality  CXR:  Mild central pulmonary vascular congestion. Mildly increased left basilar opacity.   Assessment & Plan by Problem: Active Problem: Acute severe symptomatic hypertension Troponin Elevation Community Acquired Pneumonia AKI on CKD HFrEF 45-50%  Severe symptomatic hypertension Medication Noncompliance Patient presented to urgent care  with BP of 230/137. He was having chest pain last night until this morning. Denies any blurry vision, lightheadedness, nausea, vomiting, or current chest pain. Patient given 1 dose IV labetalol 10 mg and 1 dose IV labetalol 20 mg. Patient was started on Nitroglycerin gtt and did endorse a headache after starting the medication.  Patient notes that he has not taken any of his home medications for the past 1-2 weeks. He believes that his amlodipine does not let him sleep at night and that he becomes diaphoretic and dizzy after taking the medication.  Plan: -Continue Nitroglycerin gtt. Patient endorsed headache after starting medication.  -Tylenol 650 Q6 PRN for pain -First hour: goal of reduction in mean arterial pressure by no more than 25% -Over the next 2-6 hours: Reduce to 160/100-110 mm Hg. -Once at goal (typically < 160/110 mm Hg): Transition to PO medications. -Starting IV Lasix 40 -Will continue to monitor BP  Troponin Elevation Troponin 252 and 208. Not likely due to ACS. Likely due to demand ischemia. Patient had chest pain overnight and denies any current chest pain. Initial EKG showed sinus rhythm without any signs of acute cardiac ischemia. Telemetry with ST segment depressions. Given troponemia and moderate HEART score, cardiology consulted; spoke with Dr. Flora Lipps. Will follow up EKG, telemetry changes, and troponins until flat.  Plan: -Will order repeat EKG -Continuing to trend troponin -Continuing cardiac monitoring -Complete echo ordered  Community Acquired Pneumonia Patient has been having productive cough for the last two weeks with chills and gradually increased shortness of breath. CXR showed mild pulmonary vascular congestion and left basilar opacity.Patient treated two months ago for right sided multifocal pneumonia. He was treated with a 14 day course of Augmentin. Patient states that this is third episode of pneumonia this year and denies any. COVID, RSV, and Influenza  panels are negative. He remains afebrile with no leukocytosis. No crackles heard on exam. WBC is 7.4. Patient was started on IV Ceftriaxone and IV Azithromycin.  Plan:  -Will continue IV Ceftriaxone and Azithromycin -Will get CT Chest while admitted due to history of recurrent pneumonia. Last CT Chest was 10/2022 -Monitoring CBC -Ordered procalcitonin to guide antibiotic discontinuation  CHF with mildly reduced EF of 45-50% Patient did not have any current shortness of breath. CXR showed mild pulmonary vascular congestion. No crackles or rales on exam. BNP was 647.2. Patient has not been taking his home torsemide 10 2 tablets.  Plan: -Will order lasix IV 40 mg -Ordered complete echo  AKI on CKD Stage IV Cr was 4.1 and 4.5 in the ED. Baseline Cr is 3.89. Renal US performed 07/2022 was normal. UA showed moderate Hgb and proteinuria. Microscopy did not show any bacteria. -Repeat renal US ordered -Continuing to monitor Cr -Restarted home sodium bicarb 650 BID -Ordered  CK  OSA  Patient notes episodes of apnea that wakes him up at night. He does not use a CPAP or BiPAP machine at home. He had a sleep study done in 10/2022 that showed severe sleep apnea and recommendation of auto-CPAP and possible ENT evaluation. This may be contributing to hypertension. Plan: -Continuing to monitor respiratory status -Will arrange CPAP machine before patient leaves  Depression -Started home Sertraline 50  Diet: Heart healthy [id number] Heparin Code: DNR - does not want family to know about this choice  Dispo: Admit patient to Inpatient with expected length of stay greater than 2 midnights.  SignedScherrie November, Medical Student 01/07/2023, 4:47 PM  Pager: [phone]  Attestation for Student Documentation:  I personally was present and re-performed the history, physical exam and medical decision-making activities of this service and have verified that the service and findings are accurately  documented in the student's note.  [name], MD 01/07/2023, 6:34 PM

## 2023-01-07 NOTE — ED Notes (Signed)
Attempted to call report to [facility] ED, no answer at this time. [facility] transport at bedside, informed we attempted x2 to call report to ED without success.

## 2023-01-07 NOTE — ED Notes (Signed)
Patient changed into gown and was given warm blankets

## 2023-01-07 NOTE — ED Provider Notes (Signed)
EMERGENCY DEPARTMENT AT [facility] Provider Note   CSN: [id number] Arrival date & time: 01/07/23  1249     History  Chief Complaint  Patient presents with   Shortness of Breath    Mark Hayden is a 47 y.o. male.  HPI Patient presents for shortness of breath.  Medical history includes HTN, anemia, CKD, CHF, depression, sleep apnea.  He was hospitalized 2 months ago for multifocal pneumonia and severe hypertension.  His home medications include amlodipine, Coreg, hydralazine, Imdur, and torsemide.  He states that he has not taken any of his home medications in the past week.  Over the timeframe, he has felt short of breath.  He has had a cough productive of white mucus.  He initially went to urgent care.  When they saw his blood pressure, they sent him to the ED via [facility].  He has yet to receive any blood pressure medications today.  Patient endorses cannabis use but denies any other illicit drug use.    Home Medications Prior to Admission medications   Medication Sig Start Date End Date Taking? Authorizing Provider  amLODipine (NORVASC) 10 MG tablet Take 1 tablet (10 mg total) by mouth daily. 11/12/22 02/10/23  Dorthula Nettles, DO  Blood Pressure Monitor DEVI Use to check blood pressure daily 09/10/22   Steffanie Rainwater, MD  carvedilol (COREG) 25 MG tablet Take 1 tablet (25 mg total) by mouth 2 (two) times daily. 10/30/22 10/30/23  Atway, Derwood Kaplan, DO  dapagliflozin propanediol (FARXIGA) 10 MG TABS tablet Take 1 tablet (10 mg total) by mouth daily before breakfast. 11/12/22   Sabharwal, Aditya, DO  ferrous sulfate 325 (65 FE) MG tablet Take 1 tablet (325 mg total) by mouth daily with breakfast. Patient not taking: Reported on 11/12/2022 09/03/22   Maretta Bees, MD  hydrALAZINE (APRESOLINE) 100 MG tablet Take 1 tablet (100 mg total) by mouth 3 (three) times daily. 10/30/22 11/29/22  Atway, Rayann N, DO  isosorbide mononitrate (IMDUR) 60 MG 24 hr  tablet Take 1.5 tablets (90 mg total) by mouth daily. 10/30/22 12/09/22  Atway, Derwood Kaplan, DO  potassium chloride (KLOR-CON) 10 MEQ tablet Take 1 tablet (10 mEq total) by mouth daily. 09/05/22 12/04/22  Andrey Farmer, PA-C  sertraline (ZOLOFT) 50 MG tablet Take 1 tablet (50 mg total) by mouth daily. 11/15/22 11/15/23  Morrie Sheldon, MD  sodium bicarbonate 650 MG tablet Take 1 tablet (650 mg total) by mouth 2 (two) times daily. 09/03/22   Ghimire, Werner Lean, MD  torsemide (DEMADEX) 10 MG tablet Take 2 tablets (20 mg total) by mouth daily. 11/02/22 11/02/23  Champ Mungo, DO      Allergies    Shellfish allergy    Review of Systems   Review of Systems  Constitutional:  Positive for fatigue.  Respiratory:  Positive for cough and shortness of breath.   All other systems reviewed and are negative.   Physical Exam Updated Vital Signs BP (!) 183/119   Pulse 80   Temp 98.6 F (37 C)   Resp 20   SpO2 100%  Physical Exam Vitals and nursing note reviewed.  Constitutional:      General: He is not in acute distress.    Appearance: Normal appearance. He is well-developed. He is not ill-appearing, toxic-appearing or diaphoretic.  HENT:     Head: Normocephalic and atraumatic.     Right Ear: External ear normal.     Left Ear: External ear normal.  Nose: Nose normal.     Mouth/Throat:     Mouth: Mucous membranes are moist.  Eyes:     Extraocular Movements: Extraocular movements intact.     Conjunctiva/sclera: Conjunctivae normal.  Cardiovascular:     Rate and Rhythm: Normal rate and regular rhythm.     Heart sounds: No murmur heard. Pulmonary:     Effort: Pulmonary effort is normal. No respiratory distress.     Breath sounds: Normal breath sounds. No wheezing or rales.  Chest:     Chest wall: No tenderness.  Abdominal:     General: There is no distension.     Palpations: Abdomen is soft.     Tenderness: There is no abdominal tenderness.  Musculoskeletal:        General: No  swelling. Normal range of motion.     Cervical back: Normal range of motion and neck supple.  Skin:    General: Skin is warm and dry.     Coloration: Skin is not jaundiced or pale.  Neurological:     General: No focal deficit present.     Mental Status: He is alert and oriented to person, place, and time.     Cranial Nerves: No cranial nerve deficit.     Sensory: No sensory deficit.     Motor: No weakness.     Coordination: Coordination normal.  Psychiatric:        Mood and Affect: Mood normal.        Behavior: Behavior normal.     ED Results / Procedures / Treatments   Labs (all labs ordered are listed, but only abnormal results are displayed) Labs Reviewed  BASIC METABOLIC PANEL - Abnormal; Notable for the following components:      Result Value   CO2 18 (*)    BUN 52 (*)    Creatinine, Ser 4.10 (*)    Calcium 8.1 (*)    GFR, Estimated 17 (*)    All other components within normal limits  CBC WITH DIFFERENTIAL/PLATELET - Abnormal; Notable for the following components:   RBC 3.77 (*)    Hemoglobin 11.5 (*)    HCT 34.0 (*)    All other components within normal limits  BRAIN NATRIURETIC PEPTIDE - Abnormal; Notable for the following components:   B Natriuretic Peptide 647.2 (*)    All other components within normal limits  URINALYSIS, ROUTINE W REFLEX MICROSCOPIC - Abnormal; Notable for the following components:   Glucose, UA 50 (*)    Hgb urine dipstick MODERATE (*)    Protein, ur 100 (*)    All other components within normal limits  I-STAT CHEM 8, ED - Abnormal; Notable for the following components:   BUN 45 (*)    Creatinine, Ser 4.50 (*)    TCO2 20 (*)    Hemoglobin 11.6 (*)    HCT 34.0 (*)    All other components within normal limits  TROPONIN I (HIGH SENSITIVITY) - Abnormal; Notable for the following components:   Troponin I (High Sensitivity) 252 (*)    All other components within normal limits  RESP PANEL BY RT-PCR (RSV, FLU A&B, COVID)  RVPGX2  HEPATIC  FUNCTION PANEL  MAGNESIUM  TROPONIN I (HIGH SENSITIVITY)    EKG EKG Interpretation Date/Time:  Monday January 07 2023 13:47:57 EDT Ventricular Rate:  84 PR Interval:  187 QRS Duration:  99 QT Interval:  399 QTC Calculation: 472 R Axis:   55  Text Interpretation: Sinus rhythm LAE, consider biatrial enlargement LVH  with secondary repolarization abnormality Anterior ST elevation, probably due to LVH Confirmed by Gloris Manchester [phone]) on 01/07/2023 3:01:11 PM  Radiology DG Chest Port 1 View  Result Date: 01/07/2023 CLINICAL DATA:  Dyspnea. EXAM: PORTABLE CHEST 1 VIEW COMPARISON:  Same day. FINDINGS: Stable cardiomediastinal silhouette. Mild central pulmonary vascular congestion is noted. Mildly increased left basilar opacity is noted concerning for atelectasis or infiltrate with possible minimal left pleural effusion. Right lung is clear. Bony thorax is unremarkable. IMPRESSION: Mild central pulmonary vascular congestion. Mildly increased left basilar opacity as noted above. Electronically Signed   By: Lupita Raider M.D.   On: 01/07/2023 13:44   DG Chest 2 View  Result Date: 01/07/2023 CLINICAL DATA:  Shortness of breath. EXAM: CHEST - 2 VIEW COMPARISON:  October 30, 2022. FINDINGS: Stable cardiomediastinal silhouette. Right lung is clear. Elevated left hemidiaphragm is noted with minimal left basilar subsegmental atelectasis and possible minimal left pleural effusion. Bony thorax is unremarkable. IMPRESSION: Elevated left hemidiaphragm is noted with minimal left basilar subsegmental atelectasis and possible minimal left pleural effusion. Electronically Signed   By: Lupita Raider M.D.   On: 01/07/2023 13:42    Procedures Procedures    Medications Ordered in ED Medications  nitroGLYCERIN 50 mg in dextrose 5 % 250 mL (0.2 mg/mL) infusion (75 mcg/min Intravenous Rate/Dose Change 01/07/23 1532)  azithromycin (ZITHROMAX) 500 mg in sodium chloride 0.9 % 250 mL IVPB (500 mg Intravenous New  Bag/Given 01/07/23 1617)  labetalol (NORMODYNE) injection 10 mg (10 mg Intravenous Given 01/07/23 1321)  labetalol (NORMODYNE) injection 20 mg (20 mg Intravenous Given 01/07/23 1403)  cefTRIAXone (ROCEPHIN) 1 g in sodium chloride 0.9 % 100 mL IVPB (0 g Intravenous Stopped 01/07/23 1603)    ED Course/ Medical Decision Making/ A&P                                 Medical Decision Making Amount and/or Complexity of Data Reviewed Labs: ordered. Radiology: ordered.  Risk Prescription drug management.   This patient presents to the ED for concern of cough and shortness of breath, this involves an extensive number of treatment options, and is a complaint that carries with it a high risk of complications and morbidity.  The differential diagnosis includes pneumonia, CHF, hypertensive crisis, URI   Co morbidities that complicate the patient evaluation  HTN, anemia, CKD, CHF, depression, sleep apnea   Additional history obtained:  Additional history obtained from [facility] External records from outside source obtained and reviewed including EMR   Lab Tests:  I Ordered, and personally interpreted labs.  The pertinent results include: Creatinine slightly increased from baseline; normal electrolytes; baseline anemia; no leukocytosis; elevated troponin and BNP consistent with hypertensive emergency   Imaging Studies ordered:  I ordered imaging studies including chest x-ray I independently visualized and interpreted imaging which showed central pulmonary vascular congestion; worsened left lower lobe opacity I agree with the radiologist interpretation   Cardiac Monitoring: / EKG:  The patient was maintained on a cardiac monitor.  I personally viewed and interpreted the cardiac monitored which showed an underlying rhythm of: Sinus rhythm   Problem List / ED Course / Critical interventions / Medication management  Patient presenting for shortness of breath over the past week.  He was  initially seen in urgent care.  Vital signs at urgent care were notable for severe hypertension.  He was sent to the ED via [facility].  On arrival,  patient is alert and oriented.  He is able to speak in complete sentences.  No wheezing is appreciated on lung auscultation.  Blood pressure remains markedly elevated.  Patient adamantly denies any sympathomimetic drug use.  Labetalol and NTG were ordered.  Workup was initiated.  Initial lab work was notable for creatinine mildly increased from baseline.  Chest x-ray shows pulmonary vascular congestion in addition to concern for left lower lobe pneumonia.  Antibiotics were ordered.  Following 2 doses of labetalol and NTG gtt., patient had improvement in blood pressure into the 180s SBP.  With this, he had improved shortness of breath.  Initial troponin was elevated.  I suspect this is secondary to demand from his current hypertensive crisis.  Patient was admitted for further management. I ordered medication including labetalol and nitroglycerin for hypertensive emergency; ceftriaxone and azithromycin for left lower lobe pneumonia Reevaluation of the patient after these medicines showed that the patient improved I have reviewed the patients home medicines and have made adjustments as needed   Social Determinants of Health:  History of medication nonadherence  CRITICAL CARE Performed by: Gloris Manchester   Total critical care time: 35 minutes  Critical care time was exclusive of separately billable procedures and treating other patients.  Critical care was necessary to treat or prevent imminent or life-threatening deterioration.  Critical care was time spent personally by me on the following activities: development of treatment plan with patient and/or surrogate as well as nursing, discussions with consultants, evaluation of patient's response to treatment, examination of patient, obtaining history from patient or surrogate, ordering and performing treatments  and interventions, ordering and review of laboratory studies, ordering and review of radiographic studies, pulse oximetry and re-evaluation of patient's condition.        Final Clinical Impression(s) / ED Diagnoses Final diagnoses:  Pneumonia of left lower lobe due to infectious organism  Hypertensive emergency    Rx / DC Orders ED Discharge Orders     None         Gloris Manchester, MD 01/07/23 1630

## 2023-01-07 NOTE — ED Notes (Signed)
Called [facility] ED charge phone to give report, no answer. Will attempt again.

## 2023-01-07 NOTE — ED Triage Notes (Signed)
Patient BIB EMS from Urgent care for SOB x1wk, Hypertension 257/140, cough, and ankle edema. H/O CHF, Hypertension. HR 80, 100%ra, 20gRhand

## 2023-01-07 NOTE — ED Triage Notes (Addendum)
Patient reports symptoms over 7 days.  Patient reports dry cough and sob.    Reports unable to lie flat, reports he is unable to breathe.  No visible swelling in lower extremities.  Denies fever.    Patient had left over antibiotics from prior episode of pneumonia.  Patient has been taking these antibiotics.  Patient is out of antibiotics  Has not taken blood pressure medicine today

## 2023-01-07 NOTE — Hospital Course (Addendum)
ZOX:WRUEAVW is a 47 year old male with PMH of CHF (EF 45-50%), CKD Stage IV, HTN, depression and OSA who presented with shortness of breath and productive cough. He initially went to urgent care and was found to have hypertensive emergency with BP of 230/137. CXR showed LLL opacities. Patient stated that two weeks ago he began having productive cough with one episode of blood in the sputum, shortness of breath that gradually increased, and chills. Last night he began having chest pain and trouble sleeping.  Chest pain resolved this morning.He reports having apneic episodes at night and had a sleep study completed two months ago where he was found to have OSA. He does not use a CPAP or BiPAP machine at home. He was hospitalized two months ago for multifocal pneumonia and treated with Augmentin 14 days. Patient states that he has not taken any of his home medications for 1-2 weeks and believes that his amlodipine makes him dizzy, diaphoretic, and does not allow him to sleep at night. Denies any current chest pain, abdominal pain, headache, shortness of breath or blurry vision. Endorses chronic nausea and productive cough.   Last echo was in 08/2022 with Left ventricular ejection fraction, by estimation, is 45 to 50%. The  left ventricle has mildly decreased function. The left ventricle  demonstrates global hypokinesis. There is severe left ventricular  hypertrophy.   Repeat echo 10/23 showed EF improvement to 60-65% with mild MR  Problem List: Acute Severe Symptomatic Hypertension Troponin Elevation Community Acquired Pneumonia AKI on CKD HFrEF 45-50%   Severe Symptomatic Hypertension Medication Noncompliance CHF with mildly reduced EF of 45-50% with recovered EF 60-65%  Patient presented to urgent care with BP of 230/137. He was having chest pain last night until this morning. Denies any blurry vision, lightheadedness, nausea, vomiting, or current chest pain. Patient given 1 dose IV labetalol 10 mg  and 1 dose IV labetalol 20 mg. Patient was started on Nitroglycerin gtt and did endorse a headache after starting the medication.   Patient notes that he has not taken any of his home medications for the past 1-2 weeks. He believes that his amlodipine does not let him sleep at night and that he becomes diaphoretic and dizzy after taking the medication.   Farxiga 10 mg daily  Carvedilol 37.5 mg twice daily Hydralazine 50 mg twice daily Amlodipine 10 mg daily (at bedtime) Isosorbide mononitrate 60 mg daily (at bedtime) Torsemide 20 mg daily.  If weight exceeds 205 pounds take 40 mg daily, until weight is under 205 pounds.    Troponin Elevation Troponin 252 and 208. Not likely due to ACS. Likely due to demand ischemia. Patient had chest pain overnight and denies any current chest pain. Initial EKG showed sinus rhythm without any signs of acute cardiac ischemia. Telemetry with ST segment depressions. Given troponemia and moderate HEART score, cardiology consulted; spoke with Dr. Flora Lipps. Will follow up EKG, telemetry changes, and troponins until flat.     Community Acquired Pneumonia Patient has been having productive cough for the last two weeks with chills and gradually increased shortness of breath. CXR showed mild pulmonary vascular congestion and left basilar opacity.Patient treated two months ago for right sided multifocal pneumonia. He was treated with a 14 day course of Augmentin. Patient states that this is third episode of pneumonia this year and denies any. COVID, RSV, and Influenza panels are negative. He remains afebrile with no leukocytosis. No crackles heard on exam. WBC is 7.4 on admission. Patient was started  on IV Ceftriaxone and IV Azithromycin.  WBC was 6.8 today. Procalcitonin was normal.      CHF with mildly reduced EF of 45-50% with recovered EF 60-65% Patient did not have any current shortness of breath. CXR showed mild pulmonary vascular congestion. No crackles or rales on  exam. BNP was 647.2. Patient has not been taking his home torsemide 10 2 tablets. Patient was seen by [facility] heart failure and found to not meet criteria for their team due to established care with Dr. Gasper Lloyd. Repeat echo showed EF of 60-65% with mild MR Plan:   AKI on CKD Stage IV Cr was 4.1 and 4.5 in the ED. Baseline Cr is 3.89. Renal US performed 07/2022 was normal. UA showed moderate Hgb and proteinuria. Microscopy did not show any bacteria. Renal US with echogenic kidneys and without hydronephrosis. Renal vascular ultrasound with doppler negative for renal artery stenosis.   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 They will follow patient OP and recommend medical therapy for atherosclerosis -We will obtain lipid panel and prescribe statin. -10/25 lipid panel resulted with LDL of 80. He is still above goal and we will continue to monitor in the outpatient clinic.  OSA  Patient notes episodes of apnea that wakes him up at night. He does not use a CPAP or BiPAP machine at home. He had a sleep study done in 10/2022 that showed severe sleep apnea and recommendation of auto-CPAP and possible ENT evaluation. This may be contributing to hypertension. Plan: -Continuing to monitor respiratory status -Patient will have CPAP titration OP by Cardiology   Depression -Started home Sertraline 50   Diet: Heart healthy VTE: Subcutaneous Heparin Code: DNR - does not want family to know about this choice       -----    Hello Mr. Ostiana-Ramos, you were admitted for severe symptomatic hypertension, high blood pressure. This may have been due to stopping your previous medications and not having a CPAP machine at home for your obstructive sleep apnea. We treated you with both IV medication and oral medications to control your blood pressure. We have switched you to all oral medications and wanted to monitor to you to make  sure you were tolerating those.  MEDICATIONS:  We understand this can be a lot of medicines, but it is important that you continue to take them so you do not get sick again.  Dosage change for the following medications IMDUR: 60 mg NOW - this means only one tablet at bedtime CARVEDILOL: 37.5 mg twice a day; this means take three 12.5 mg tablets every 12 hours  HYDRALAZINE: 50 mg in the morning and at night  CONTINUE: TORSEMIDE: 20MG  (2 TABLETS) once a day.  - Weigh yourself every day.  - if you weigh more than 205 pounds, take an addition 2 tablets ( for a total of 4 tablets per day)  until weight is under 205 pounds. AMLODIPINE 10 mg once a day FARXIGA10 mg once a day SODIUM BICARBONATE 650 MG - one tablet in the AM and one in the PM Potassium Chloride 10 meq once a day  NEW MEDICATIONS  - for your narrow celiac artery  Crestor 20 mg once a day Aspirin 81 mg daily  Please keep the following appointments:  Internal Medicine Clinic 01/21/2023 at 10:45 am Cardiology on 01/22/2023 at 9:30 am and 02/06/2023 at 2:00 pm We have attached the name and the number for the  vascular surgeon (Dr. Lenell Antu); please call them to make a follow up appointment: [phone]  If you are having any of the following symptoms, please seek medical attention: chest pain, shortness of breath, severe headache or sudden changes in your vision. These can be symptoms that you get when your blood pressure is too high.  It was a pleasure taking care of you!  [name] Internal Medicine

## 2023-01-07 NOTE — Consult Note (Signed)
Cardiology Consultation   Patient ID: Mark Hayden MRN: [id number]; DOB: 1976-02-07  Admit date: 01/07/2023 Date of Consult: 01/07/2023  PCP:  Lovie Macadamia, MD   [facility] Providers Cardiologist:  Dr Gasper Lloyd   Patient Profile:   Mark Hayden is a 47 y.o. male with a hx of uncontrolled HTN with multiple recurrent admission for HTN crisis, CKD IV, HFmrEF, who is being seen 01/07/2023 for the evaluation of elevated troponin at the request of Dr Heide Spark.  History of Present Illness:   Mark Hayden with above PMH presented to ER today for SOB for 1 week. He reports associated cough, ankle edema. He was hypertensive with BP 257/140 at ED. He has not taken any of his cardiac medications including his diuretics in over a week.  He stopped all his medications.  He stopped the "blood pressure medication" since it caused him to feel like he was falling off a cliff (I suspect he is describing orthostatic hypotension).  He then stop the diuretic since he was started urinating all the time and wanted to feel "normal" for a while.  He presents with a severe hypertensive crisis and acute exacerbation of chronic heart failure with orthopnea and early findings of pulmonary edema on chest x-ray.  Per chart review, he was recently hospitalized here 10/28/22 to 10/30/22 for multifocal pneumonia, HTN crisis, and acute HFmrEF. He routinely follows AHF team. He was admitted 09/2021 for HTN crisis in the setting of medication non-compliance. Echo showed  EF 60-65%, moderate MR, moderate TR. He was admitted 07/2022 for HTN crisis again in the setting of stop meds for 2 weeks. He was also in CHF exacerbation, required IV diuresis. Echo from 08/18/22 showed LVEF 45-50% , global hypokinesis, severe LVH, indeterminate diastolic parameters. Normal RV. Mild LAE. Mod to severe MR. He suffered AKI on CKD with Cr peaked 4.1 and down to 3.5 at the time of discharge. He was admitted  again 08/2022 for HTN crisis and CHF. He was non-complaint with meds and diet. He required IV diuresis. Toprolol XL was switched to coreg in follow up visit 08/2022 for better BP control. cMRI was planned due to family hx of CM but not yet completed . His cardiomyopathy was felt due to uncontrolled HTN. He was started on GDMT with coreg 25mg  BID, hydralazine 100mg  TID+Imdur 90mg  daily, jardiance 10mg  daily, and lasix 40mg  daily. Renal disease not permitting ARNI/ARB/ACEI/MRA use. He was last seen by Dr Gasper Lloyd 11/12/22 for follow up, doing well for CHF standpoint, BP mostly in 160s, continue use marijuana. He was noted with severely elevated BP at the office, he also mentioned he was told by his nephrologist he is progressing to ESRD and likely need dialysis in the near future. He was re-started on Farixga 10mg  daily, while continued on hydralazine 100mg  TOID, imdur 60mg , and added amlodipine 10mg  daily. Renal doppler was requested and remains pending. He had sleep study done on 11/13/22 by Dr Mayford Knife, he was found to have severe OSA.   Admission diagnostic today showed Cr elevated 4.1, GFR 17, BUN 52, bicarb 18. BNP 647. Hs trop 252 >208. CBC with Hgb 11.5. CXR showed Elevated left hemidiaphragm is noted with minimal left basilar subsegmental atelectasis and possible minimal left pleural effusion.He was hypertensive with BP up to 235/163 at ED. He was given z-pack, ceftriaxone, lasix 40mg  x1, labetalol 20mg , nitroglycerin gtt     Past Medical History:  Diagnosis Date   CHF (congestive heart failure) (HCC)    Hypertension  Hypertensive emergency 08/20/2022   Pneumonia    Sleep apnea    Suicidal thoughts 09/08/2019    No past surgical history on file.     Inpatient Medications: Scheduled Meds:  heparin  5,000 Units Subcutaneous Q8H   sertraline  50 mg Oral Daily   sodium bicarbonate  650 mg Oral BID   sodium chloride flush  3 mL Intravenous Q12H   Continuous Infusions:  nitroGLYCERIN 100  mcg/min (01/07/23 1630)   PRN Meds: acetaminophen **OR** acetaminophen, senna-docusate  Allergies:    Allergies  Allergen Reactions   Shellfish Allergy Anaphylaxis and Itching    seafood    Social History:   Social History   Socioeconomic History   Marital status: Single    Spouse name: Not on file   Number of children: 1   Years of education: Not on file   Highest education level: High school graduate  Occupational History   Occupation: [employer]  Tobacco Use   Smoking status: Former   Smokeless tobacco: Never  Advertising account planner   Vaping status: Never Used  Substance and Sexual Activity   Alcohol use: No   Drug use: Yes    Types: Marijuana   Sexual activity: Not on file  Other Topics Concern   Not on file  Social History Narrative   Not on file   Social Determinants of Health   Financial Resource Strain: High Risk (09/12/2022)   Overall Financial Resource Strain (CARDIA)    Difficulty of Paying Living Expenses: Very hard  Food Insecurity: Food Insecurity Present (09/05/2022)   Hunger Vital Sign    Worried About Running Out of Food in the Last Year: Sometimes true    Ran Out of Food in the Last Year: Sometimes true  Transportation Needs: No Transportation Needs (09/05/2022)   PRAPARE - Administrator, Civil Service (Medical): No    Lack of Transportation (Non-Medical): No  Recent Concern: Transportation Needs - Unmet Transportation Needs (08/17/2022)   PRAPARE - Administrator, Civil Service (Medical): Yes    Lack of Transportation (Non-Medical): Yes  Physical Activity: Not on file  Stress: Not on file  Social Connections: Not on file  Intimate Partner Violence: Not At Risk (08/17/2022)   Humiliation, Afraid, Rape, and Kick questionnaire    Fear of Current or Ex-Partner: No    Emotionally Abused: No    Physically Abused: No    Sexually Abused: No    Family History:    Family History  Problem Relation Age of Onset   Diabetes Mother     Diabetes Father      ROS:  Please see the history of present illness.   All other ROS reviewed and negative.     Physical Exam/Data:   Vitals:   01/07/23 1545 01/07/23 1600 01/07/23 1615 01/07/23 1630  BP: (!) 202/140 (!) 191/125 (!) 183/119 (!) 188/121  Pulse: 86 69 80 84  Resp: (!) 22 (!) 24 20 (!) 32  Temp:      SpO2: 100% 100% 100% 100%   No intake or output data in the 24 hours ending 01/07/23 1739    11/15/2022   10:44 AM 11/12/2022    3:39 PM 11/02/2022   10:32 AM  Last 3 Weights  Weight (lbs) 213 lb 8 oz 209 lb 3.2 oz 213 lb 12.8 oz  Weight (kg) 96.843 kg 94.892 kg 96.979 kg     There is no height or weight on file to calculate BMI.  General:  Well nourished, well developed, in no acute distress, but visibly orthopneic HEENT: normal Neck: no JVD Vascular: No carotid bruits; Distal pulses 2+ bilaterally Cardiac:  normal S1, S2, S3 present; RRR; no murmur  Lungs:  Lungs are clear to auscultation anteriorly, with very few scant crackles exclusively in the bases posteriorly Abd: soft, nontender, no hepatomegaly  Ext: no edema Musculoskeletal:  No deformities, BUE and BLE strength normal and equal Skin: warm and dry  Neuro:  CNs 2-12 intact, no focal abnormalities noted Psych:  Normal affect   EKG:  The EKG was personally reviewed and demonstrates: Sinus rhythm, LVH with prominent repolarization abnormalities Telemetry:  Telemetry was personally reviewed and demonstrates: Sinus rhythm  Relevant CV Studies: Echocardiogram 08/18/2022.    1. Left ventricular ejection fraction, by estimation, is 45 to 50%. The  left ventricle has mildly decreased function. The left ventricle  demonstrates global hypokinesis. There is severe left ventricular  hypertrophy. Left ventricular diastolic parameters   are indeterminate. The average left ventricular global longitudinal  strain is -15.8 %. The global longitudinal strain is abnormal.   2. Right ventricular systolic function is  normal. The right ventricular  size is normal.   3. Left atrial size was mildly dilated.   4. The mitral valve is normal in structure. Moderate to severe mitral  valve regurgitation. No evidence of mitral stenosis.   5. The aortic valve is tricuspid. Aortic valve regurgitation is not  visualized. No aortic stenosis is present.   6. Pulmonic valve regurgitation is severe.   7. The inferior vena cava is normal in size with greater than 50%  respiratory variability, suggesting right atrial pressure of 3 mmHg.   Images were reviewed personally:  I would not characterize the diastolic parameters as "indeterminate".  He has frank pseudonormal mitral inflow with elevated mean left atrial pressure.  Laboratory Data:  High Sensitivity Troponin:   Recent Labs  Lab 01/07/23 1300 01/07/23 1520  TROPONINIHS 252* 208*     Chemistry Recent Labs  Lab 01/07/23 1300 01/07/23 1349  NA 137 143  K 3.7 3.8  CL 110 110  CO2 18*  --   GLUCOSE 85 85  BUN 52* 45*  CREATININE 4.10* 4.50*  CALCIUM 8.1*  --   MG 2.0  --   GFRNONAA 17*  --   ANIONGAP 9  --     Recent Labs  Lab 01/07/23 1300  PROT 7.2  ALBUMIN 3.6  AST 22  ALT 18  ALKPHOS 85  BILITOT 0.8   Lipids No results for input(s): "CHOL", "TRIG", "HDL", "LABVLDL", "LDLCALC", "CHOLHDL" in the last 168 hours.  Hematology Recent Labs  Lab 01/07/23 1300 01/07/23 1349  WBC 7.4  --   RBC 3.77*  --   HGB 11.5* 11.6*  HCT 34.0* 34.0*  MCV 90.2  --   MCH 30.5  --   MCHC 33.8  --   RDW 13.2  --   PLT 197  --    Thyroid No results for input(s): "TSH", "FREET4" in the last 168 hours.  BNP Recent Labs  Lab 01/07/23 1300  BNP 647.2*    DDimer No results for input(s): "DDIMER" in the last 168 hours.   Radiology/Studies:  DG Chest Port 1 View  Result Date: 01/07/2023 CLINICAL DATA:  Dyspnea. EXAM: PORTABLE CHEST 1 VIEW COMPARISON:  Same day. FINDINGS: Stable cardiomediastinal silhouette. Mild central pulmonary vascular  congestion is noted. Mildly increased left basilar opacity is noted concerning for atelectasis or infiltrate  with possible minimal left pleural effusion. Right lung is clear. Bony thorax is unremarkable. IMPRESSION: Mild central pulmonary vascular congestion. Mildly increased left basilar opacity as noted above. Electronically Signed   By: Lupita Raider M.D.   On: 01/07/2023 13:44   DG Chest 2 View  Result Date: 01/07/2023 CLINICAL DATA:  Shortness of breath. EXAM: CHEST - 2 VIEW COMPARISON:  October 30, 2022. FINDINGS: Stable cardiomediastinal silhouette. Right lung is clear. Elevated left hemidiaphragm is noted with minimal left basilar subsegmental atelectasis and possible minimal left pleural effusion. Bony thorax is unremarkable. IMPRESSION: Elevated left hemidiaphragm is noted with minimal left basilar subsegmental atelectasis and possible minimal left pleural effusion. Electronically Signed   By: Lupita Raider M.D.   On: 01/07/2023 13:42     Assessment and Plan:   Acute on chronic combined systolic and diastolic heart failure: Severe hypertensive hypertrophic cardiomyopathy.  Exacerbation due to noncompliance with medications and severe hypertension.  Currently on intravenous nitroglycerin.  Will gradually reintroduce oral antihypertensive medications.  Choices are limited by the concomitant renal dysfunction.  I suspect that the worst side effects are related to hydralazine with subsequent orthostatic hypotension.  Will try to use a lower dose.  However, the biggest problem is his noncompliance with medications.  He has not been compliant with requested follow-up testing including cardiac MRI and renal duplex ultrasound.  Will try to get the latter test while he is here now. 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 Resume beta blocker, amlodipine and hydralazine nitrates. Will use moderate doses first. CKD4: Slightly worse than a few months  ago.  High risk of progression to ESRD/dialysis. MR: Likely secondary to the cardiomyopathy rather than a primary abnormality.  Reevaluate echocardiogram when his blood pressure is well-controlled.  Putting in her leg getting  Reinforced importance of compliance with medications and routine follow-up, sodium restriction, daily weight monitoring.  Unless he can change his approach to his current medical problems the prognosis is dismal.   Risk Assessment/Risk Scores:        New York Heart Association (NYHA) Functional Class NYHA Class IV        For questions or updates, please contact [facility] Please consult [email or web address] for contact info under   Thurmon Fair, MD, [facility] [phone] office [phone] pager\

## 2023-01-08 ENCOUNTER — Other Ambulatory Visit (HOSPITAL_COMMUNITY): Payer: Managed Care, Other (non HMO)

## 2023-01-08 ENCOUNTER — Observation Stay (HOSPITAL_COMMUNITY): Payer: Commercial Managed Care - HMO

## 2023-01-08 ENCOUNTER — Encounter (HOSPITAL_COMMUNITY): Payer: Managed Care, Other (non HMO)

## 2023-01-08 DIAGNOSIS — I428 Other cardiomyopathies: Secondary | ICD-10-CM | POA: Diagnosis not present

## 2023-01-08 DIAGNOSIS — R0989 Other specified symptoms and signs involving the circulatory and respiratory systems: Secondary | ICD-10-CM | POA: Diagnosis present

## 2023-01-08 DIAGNOSIS — R9431 Abnormal electrocardiogram [ECG] [EKG]: Secondary | ICD-10-CM | POA: Diagnosis not present

## 2023-01-08 DIAGNOSIS — N184 Chronic kidney disease, stage 4 (severe): Secondary | ICD-10-CM | POA: Diagnosis present

## 2023-01-08 DIAGNOSIS — I771 Stricture of artery: Secondary | ICD-10-CM | POA: Diagnosis present

## 2023-01-08 DIAGNOSIS — I161 Hypertensive emergency: Secondary | ICD-10-CM

## 2023-01-08 DIAGNOSIS — Z87891 Personal history of nicotine dependence: Secondary | ICD-10-CM | POA: Diagnosis not present

## 2023-01-08 DIAGNOSIS — K559 Vascular disorder of intestine, unspecified: Secondary | ICD-10-CM | POA: Diagnosis not present

## 2023-01-08 DIAGNOSIS — Z841 Family history of disorders of kidney and ureter: Secondary | ICD-10-CM | POA: Diagnosis not present

## 2023-01-08 DIAGNOSIS — I130 Hypertensive heart and chronic kidney disease with heart failure and stage 1 through stage 4 chronic kidney disease, or unspecified chronic kidney disease: Secondary | ICD-10-CM | POA: Diagnosis present

## 2023-01-08 DIAGNOSIS — G4733 Obstructive sleep apnea (adult) (pediatric): Secondary | ICD-10-CM | POA: Diagnosis present

## 2023-01-08 DIAGNOSIS — Z66 Do not resuscitate: Secondary | ICD-10-CM | POA: Diagnosis present

## 2023-01-08 DIAGNOSIS — R519 Headache, unspecified: Secondary | ICD-10-CM | POA: Diagnosis not present

## 2023-01-08 DIAGNOSIS — I951 Orthostatic hypotension: Secondary | ICD-10-CM | POA: Diagnosis present

## 2023-01-08 DIAGNOSIS — I5033 Acute on chronic diastolic (congestive) heart failure: Secondary | ICD-10-CM | POA: Diagnosis not present

## 2023-01-08 DIAGNOSIS — I160 Hypertensive urgency: Secondary | ICD-10-CM | POA: Diagnosis present

## 2023-01-08 DIAGNOSIS — Z8249 Family history of ischemic heart disease and other diseases of the circulatory system: Secondary | ICD-10-CM | POA: Diagnosis not present

## 2023-01-08 DIAGNOSIS — Z79899 Other long term (current) drug therapy: Secondary | ICD-10-CM | POA: Diagnosis not present

## 2023-01-08 DIAGNOSIS — Z91013 Allergy to seafood: Secondary | ICD-10-CM | POA: Diagnosis not present

## 2023-01-08 DIAGNOSIS — I2489 Other forms of acute ischemic heart disease: Secondary | ICD-10-CM | POA: Diagnosis present

## 2023-01-08 DIAGNOSIS — J189 Pneumonia, unspecified organism: Secondary | ICD-10-CM | POA: Diagnosis not present

## 2023-01-08 DIAGNOSIS — F32A Depression, unspecified: Secondary | ICD-10-CM | POA: Diagnosis present

## 2023-01-08 DIAGNOSIS — I159 Secondary hypertension, unspecified: Secondary | ICD-10-CM | POA: Diagnosis present

## 2023-01-08 DIAGNOSIS — I10 Essential (primary) hypertension: Secondary | ICD-10-CM | POA: Diagnosis not present

## 2023-01-08 DIAGNOSIS — Z1152 Encounter for screening for COVID-19: Secondary | ICD-10-CM | POA: Diagnosis not present

## 2023-01-08 DIAGNOSIS — I5043 Acute on chronic combined systolic (congestive) and diastolic (congestive) heart failure: Secondary | ICD-10-CM | POA: Diagnosis present

## 2023-01-08 DIAGNOSIS — R809 Proteinuria, unspecified: Secondary | ICD-10-CM | POA: Diagnosis present

## 2023-01-08 DIAGNOSIS — F1290 Cannabis use, unspecified, uncomplicated: Secondary | ICD-10-CM | POA: Diagnosis present

## 2023-01-08 DIAGNOSIS — N179 Acute kidney failure, unspecified: Secondary | ICD-10-CM | POA: Diagnosis present

## 2023-01-08 DIAGNOSIS — Z91148 Patient's other noncompliance with medication regimen for other reason: Secondary | ICD-10-CM | POA: Diagnosis not present

## 2023-01-08 DIAGNOSIS — I422 Other hypertrophic cardiomyopathy: Secondary | ICD-10-CM | POA: Diagnosis present

## 2023-01-08 LAB — CBC
HCT: 29.8 % — ABNORMAL LOW (ref 39.0–52.0)
Hemoglobin: 9.9 g/dL — ABNORMAL LOW (ref 13.0–17.0)
MCH: 29.6 pg (ref 26.0–34.0)
MCHC: 33.2 g/dL (ref 30.0–36.0)
MCV: 89.2 fL (ref 80.0–100.0)
Platelets: 159 10*3/uL (ref 150–400)
RBC: 3.34 MIL/uL — ABNORMAL LOW (ref 4.22–5.81)
RDW: 13.4 % (ref 11.5–15.5)
WBC: 6.8 10*3/uL (ref 4.0–10.5)
nRBC: 0 % (ref 0.0–0.2)

## 2023-01-08 LAB — APTT: aPTT: 28 s (ref 24–36)

## 2023-01-08 LAB — BASIC METABOLIC PANEL
Anion gap: 13 (ref 5–15)
BUN: 43 mg/dL — ABNORMAL HIGH (ref 6–20)
CO2: 18 mmol/L — ABNORMAL LOW (ref 22–32)
Calcium: 8.1 mg/dL — ABNORMAL LOW (ref 8.9–10.3)
Chloride: 109 mmol/L (ref 98–111)
Creatinine, Ser: 4.03 mg/dL — ABNORMAL HIGH (ref 0.61–1.24)
GFR, Estimated: 18 mL/min — ABNORMAL LOW (ref >60)
Glucose, Bld: 104 mg/dL — ABNORMAL HIGH (ref 70–99)
Potassium: 3.2 mmol/L — ABNORMAL LOW (ref 3.5–5.1)
Sodium: 140 mmol/L (ref 135–145)

## 2023-01-08 LAB — TROPONIN I (HIGH SENSITIVITY): Troponin I (High Sensitivity): 162 ng/L (ref <18)

## 2023-01-08 LAB — PROTIME-INR
INR: 1.1 (ref 0.8–1.2)
Prothrombin Time: 14.3 s (ref 11.4–15.2)

## 2023-01-08 LAB — CBG MONITORING, ED: Glucose-Capillary: 120 mg/dL — ABNORMAL HIGH (ref 70–99)

## 2023-01-08 MED ORDER — ISOSORBIDE MONONITRATE ER 60 MG PO TB24
60.0000 mg | ORAL_TABLET | Freq: Every day | ORAL | Status: DC
  Administered 2023-01-08 – 2023-01-11 (×4): 60 mg via ORAL
  Filled 2023-01-08 (×4): qty 1

## 2023-01-08 MED ORDER — FUROSEMIDE 10 MG/ML IJ SOLN
40.0000 mg | Freq: Once | INTRAMUSCULAR | Status: AC
Start: 2023-01-08 — End: 2023-01-08
  Administered 2023-01-08: 40 mg via INTRAVENOUS
  Filled 2023-01-08: qty 4

## 2023-01-08 MED ORDER — POTASSIUM CHLORIDE CRYS ER 20 MEQ PO TBCR
20.0000 meq | EXTENDED_RELEASE_TABLET | Freq: Once | ORAL | Status: AC
Start: 2023-01-08 — End: 2023-01-08
  Administered 2023-01-08: 20 meq via ORAL

## 2023-01-08 MED ORDER — CARVEDILOL 25 MG PO TABS
25.0000 mg | ORAL_TABLET | Freq: Two times a day (BID) | ORAL | Status: DC
  Administered 2023-01-08: 25 mg via ORAL
  Filled 2023-01-08: qty 1

## 2023-01-08 MED ORDER — HYDRALAZINE HCL 50 MG PO TABS
100.0000 mg | ORAL_TABLET | Freq: Three times a day (TID) | ORAL | Status: DC
  Administered 2023-01-08 – 2023-01-09 (×3): 100 mg via ORAL
  Filled 2023-01-08 (×3): qty 2

## 2023-01-08 NOTE — Progress Notes (Signed)
   Patient Name: Mark Hayden Date of Encounter: 01/08/2023 [facility] Cardiologist: Gasper Lloyd  Interval Summary  .    Breathing markedly improved since yesterday.  Blood pressure still very elevated.  Denies headaches, other neurological complaints, chest pain In/out incompletely recorded.  Vital Signs .    Vitals:   01/08/23 0815 01/08/23 0830 01/08/23 0854 01/08/23 0900  BP: (!) 151/107 (!) 173/102 (!) 172/105 (!) 180/113  Pulse: 94 91 92 85  Resp: (!) 29 (!) 33 17 (!) 30  Temp:   97.7 F (36.5 C)   TempSrc:   Oral   SpO2: 100% 100% 100% 100%    Intake/Output Summary (Last 24 hours) at 01/08/2023 0929 Last data filed at 01/08/2023 0816 Gross per 24 hour  Intake --  Output 1450 ml  Net -1450 ml      11/15/2022   10:44 AM 11/12/2022    3:39 PM 11/02/2022   10:32 AM  Last 3 Weights  Weight (lbs) 213 lb 8 oz 209 lb 3.2 oz 213 lb 12.8 oz  Weight (kg) 96.843 kg 94.892 kg 96.979 kg      Telemetry/ECG    Normal sinus rhythm- Personally Reviewed  Physical Exam .   GEN: No acute distress.   Neck: No JVD Cardiac: RRR, no murmurs, rubs, S3 gallop and.  Respiratory: Clear to auscultation bilaterally. GI: Soft, nontender, non-distended  MS: No edema  Assessment & Plan .     Improving acute exacerbation of heart failure due to severe hypertension and noncompliance with medications. No function stable with diuresis. Limited choices as far as antihypertensive/heart failure medications due to advanced kidney disease. Will try to use the smallest necessary amount of hydralazine since I suspect this is causing his symptoms of sudden hypotension which have led him to stop medications. For send need for cocaine abstinence.  For questions or updates, please contact [facility] Please consult [email or web address] for contact info under        Signed, Thurmon Fair, MD

## 2023-01-08 NOTE — Progress Notes (Signed)
Patient arrived in the unit from the ED,Pt is on nitroglycerin drip at 45mcg,CCMD notified,Vitals checked,pt oriented to the unit

## 2023-01-08 NOTE — ED Notes (Signed)
Pt dry heaving, cold sweaty. VSS. CBG normal. Nausea resolved.

## 2023-01-08 NOTE — Progress Notes (Signed)
Subjective: Patient was seen and examined at bedside. We are still waiting for bed placement on the unit and saw the patient in the ED. He reports that he slept well overnight. Patient still has complaints of headache after starting the NTG gtt with som relief with Tylenol. He denies any chest pain, lightheadedness, shortness of breath, cough, or sputum production.  Objective:  Vital signs in last 24 hours: Vitals:   01/08/23 0545 01/08/23 0600 01/08/23 0627 01/08/23 0630  BP: (!) 171/103 (!) 182/106 (!) 197/98 (!) 182/115  Pulse: 93 96  94  Resp:      Temp:      TempSrc:      SpO2: 100% 100%  100%   Weight change:   Intake/Output Summary (Last 24 hours) at 01/08/2023 0711 Last data filed at 01/08/2023 0530 Gross per 24 hour  Intake --  Output 1200 ml  Net -1200 ml   Physical Exam Constitutional:      General: He is not in acute distress. HENT:     Head: Normocephalic.  Cardiovascular:     Rate and Rhythm: Normal rate and regular rhythm.     Pulses: Normal pulses.     Heart sounds: Normal heart sounds.  Pulmonary:     Effort: Pulmonary effort is normal.     Breath sounds: Normal breath sounds.  Abdominal:     General: Abdomen is flat. Bowel sounds are normal.  Musculoskeletal:     Right lower leg: No edema.     Left lower leg: No edema.  Skin:    General: Skin is warm.  Neurological:     Mental Status: He is alert and oriented to person, place, and time.      Assessment/Plan: Acute Severe Symptomatic Hypertension Troponin Elevation Community Acquired Pneumonia AKI on CKD HFrEF 45-50%   Severe Symptomatic Hypertension Medication Noncompliance Patient presented to urgent care with BP of 230/137. He was having chest pain last night until this morning. Denies any blurry vision, lightheadedness, nausea, vomiting, or current chest pain. Patient given 1 dose IV labetalol 10 mg and 1 dose IV labetalol 20 mg. Patient was started on Nitroglycerin gtt and did endorse  a headache after starting the medication.   Patient notes that he has not taken any of his home medications for the past 1-2 weeks. He believes that his amlodipine does not let him sleep at night and that he becomes diaphoretic and dizzy after taking the medication.   Plan: -Cardiology was consulted for patients increased heart score and troponin elevation. We are continuing to follow their recommendations. -Continue Nitroglycerin gtt. Patient endorsed headache after starting medication. Continuing to titrate down the dose. Patient currently on 84mcg/min from 158mcg/min this morning. -Tylenol 650 Q6 PRN for pain -Once at goal (typically < 160/110 mm Hg): Transition to PO medications. -Patient received one dose IV Lasix 40 -Continue amlodipine 10 -Continue carvedilol 25 BID -Continue hydralazine 50 Q8 -Will continue to monitor BP   Troponin Elevation Troponin 252 and 208. Not likely due to ACS. Likely due to demand ischemia. Patient had chest pain overnight and denies any current chest pain. Initial EKG showed sinus rhythm without any signs of acute cardiac ischemia. Telemetry with ST segment depressions. Given troponemia and moderate HEART score, cardiology consulted; spoke with Dr. Flora Lipps. Will follow up EKG, telemetry changes, and troponins until flat.  Plan: -Will order repeat EKG -Continuing to trend troponin -Continuing cardiac monitoring -Complete echo ordered   Community Acquired Pneumonia Patient has been having  productive cough for the last two weeks with chills and gradually increased shortness of breath. CXR showed mild pulmonary vascular congestion and left basilar opacity.Patient treated two months ago for right sided multifocal pneumonia. He was treated with a 14 day course of Augmentin. Patient states that this is third episode of pneumonia this year and denies any. COVID, RSV, and Influenza panels are negative. He remains afebrile with no leukocytosis. No crackles heard on  exam. WBC is 7.4 on admission. Patient was started on IV Ceftriaxone and IV Azithromycin.  WBC was 6.8 today. Procalcitonin was normal. Plan:  -Will discontinue IV Ceftriaxone and Azithromycin -Will get CT Chest while admitted due to history of recurrent pneumonia. Last CT Chest was 10/2022 -Monitoring CBC    CHF with mildly reduced EF of 45-50% Patient did not have any current shortness of breath. CXR showed mild pulmonary vascular congestion. No crackles or rales on exam. BNP was 647.2. Patient has not been taking his home torsemide 10 2 tablets. Patient was seen by [facility] heart failure and found to not meet criteria for their team due to established care with Dr. Gasper Lloyd. Plan: -Patient received one dose lasix IV 40 mg -Ordered complete echo -Continue Farxiga 10    AKI on CKD Stage IV Cr was 4.1 and 4.5 in the ED. Baseline Cr is 3.89. Renal US performed 07/2022 was normal. UA showed moderate Hgb and proteinuria. Microscopy did not show any bacteria. -Repeat renal US ordered -Continuing to monitor Cr -Restarted home sodium bicarb 650 BID -Ordered CK   OSA  Patient notes episodes of apnea that wakes him up at night. He does not use a CPAP or BiPAP machine at home. He had a sleep study done in 10/2022 that showed severe sleep apnea and recommendation of auto-CPAP and possible ENT evaluation. This may be contributing to hypertension. Plan: -Continuing to monitor respiratory status -Will arrange CPAP machine before patient leaves   Depression -Started home Sertraline 50   Diet: Heart healthy VTE: Subcutaneous Heparin Code: DNR - does not want family to know about this choice   Dispo: Admit patient to Inpatient with expected length of stay greater than 2 midnights.   [name], MS4   LOS: 0 days   [name], Medical Student 01/08/2023, 7:11 AM

## 2023-01-08 NOTE — ED Notes (Signed)
Assumed care of pt. Pt asleep with even and unlabored respirations. Nitroglycerin gtt infusing; continuous cardiac monitor in place. Admitting team rounding.

## 2023-01-08 NOTE — Progress Notes (Signed)
Heart Failure Navigator Progress Note  Assessed for Heart & Vascular TOC clinic readiness.  Patient does not meet criteria due to established Heart Failure Team patient of Dr. Gasper Lloyd.  Navigator will sign off at this time.  Roxy Horseman, RN, BSN [facility] Heart Failure Navigator Secure Chat Only

## 2023-01-08 NOTE — ED Notes (Signed)
ED TO INPATIENT HANDOFF REPORT  ED Nurse Name and Phone #: Dahlia Client [phone]  S Name/Age/Gender Mark Hayden 47 y.o. male Room/Bed: 037C/037C  Code Status   Code Status: Limited: Do not attempt resuscitation (DNR) -DNR-LIMITED -Do Not Intubate/DNI   Home/SNF/Other Home Patient oriented to: self, place, time, and situation Is this baseline? Yes   Triage Complete: Triage complete  Chief Complaint Hypertensive emergency [I16.1]  Triage Note Patient BIB EMS from Urgent care for SOB x1wk, Hypertension 257/140, cough, and ankle edema. H/O CHF, Hypertension. HR 80, 100%ra, 20gRhand   Allergies Allergies  Allergen Reactions   Shellfish Allergy Anaphylaxis and Itching    seafood    Level of Care/Admitting Diagnosis ED Disposition     ED Disposition  Admit   Condition  --   Comment  Hospital [facility] [[id number]]  Level of Care: Progressive [102]  Admit to Progressive based on following criteria: CARDIOVASCULAR & THORACIC of moderate stability with acute coronary syndrome symptoms/low risk myocardial infarction/hypertensive urgency/arrhythmias/heart failure potentially compromising stability and stable post cardiovascular intervention patients.  May place patient in observation at [facility] or [facility] if equivalent level of care is available:: No  Covid Evaluation: Confirmed COVID Negative  Diagnosis: Hypertensive emergency [098119]  Admitting Physician: Ginnie Smart [[id number]]  Attending Physician: HATCHER, JEFFREY C [[id number]]          B Medical/Surgery History Past Medical History:  Diagnosis Date   CHF (congestive heart failure) (HCC)    Hypertension    Hypertensive emergency 08/20/2022   Pneumonia    Sleep apnea    Suicidal thoughts 09/08/2019   History reviewed. No pertinent surgical history.   A IV Location/Drains/Wounds Patient Lines/Drains/Airways Status     Active Line/Drains/Airways     Name Placement date  Placement time Site Days   Peripheral IV 01/07/23 20 G Posterior;Right Hand 01/07/23  1234  Hand  1            Intake/Output Last 24 hours  Intake/Output Summary (Last 24 hours) at 01/08/2023 1405 Last data filed at 01/08/2023 1327 Gross per 24 hour  Intake 387.52 ml  Output 2600 ml  Net -2212.48 ml    Labs/Imaging Results for orders placed or performed during the hospital encounter of 01/07/23 (from the past 48 hour(s))  Resp panel by RT-PCR (RSV, Flu A&B, Covid) Anterior Nasal Swab     Status: None   Collection Time: 01/07/23  1:00 PM   Specimen: Anterior Nasal Swab  Result Value Ref Range   SARS Coronavirus 2 by RT PCR NEGATIVE NEGATIVE   Influenza A by PCR NEGATIVE NEGATIVE   Influenza B by PCR NEGATIVE NEGATIVE    Comment: (NOTE) The Xpert Xpress SARS-CoV-2/FLU/RSV plus assay is intended as an aid in the diagnosis of influenza from Nasopharyngeal swab specimens and should not be used as a sole basis for treatment. Nasal washings and aspirates are unacceptable for Xpert Xpress SARS-CoV-2/FLU/RSV testing.  Fact Sheet for Patients: BloggerCourse.com  Fact Sheet for Healthcare Providers: SeriousBroker.it  This test is not yet approved or cleared by the [place] FDA and has been authorized for detection and/or diagnosis of SARS-CoV-2 by FDA under an Emergency Use Authorization (EUA). This EUA will remain in effect (meaning this test can be used) for the duration of the COVID-19 declaration under Section 564(b)(1) of the Act, 21 U.S.C. section 360bbb-3(b)(1), unless the authorization is terminated or revoked.     Resp Syncytial Virus by PCR NEGATIVE NEGATIVE  Comment: (NOTE) Fact Sheet for Patients: BloggerCourse.com  Fact Sheet for Healthcare Providers: SeriousBroker.it  This test is not yet approved or cleared by the [place] FDA and has been  authorized for detection and/or diagnosis of SARS-CoV-2 by FDA under an Emergency Use Authorization (EUA). This EUA will remain in effect (meaning this test can be used) for the duration of the COVID-19 declaration under Section 564(b)(1) of the Act, 21 U.S.C. section 360bbb-3(b)(1), unless the authorization is terminated or revoked.  Performed at [facility], [address] [place], [place] [address]   Basic metabolic panel     Status: Abnormal   Collection Time: 01/07/23  1:00 PM  Result Value Ref Range   Sodium 137 135 - 145 mmol/L   Potassium 3.7 3.5 - 5.1 mmol/L   Chloride 110 98 - 111 mmol/L   CO2 18 (L) 22 - 32 mmol/L   Glucose, Bld 85 70 - 99 mg/dL    Comment: Glucose reference range applies only to samples taken after fasting for at least 8 hours.   BUN 52 (H) 6 - 20 mg/dL   Creatinine, Ser 6.04 (H) 0.61 - 1.24 mg/dL   Calcium 8.1 (L) 8.9 - 10.3 mg/dL   GFR, Estimated 17 (L) >60 mL/min    Comment: (NOTE) Calculated using the CKD-EPI Creatinine Equation (2021)    Anion gap 9 5 - 15    Comment: Performed at [facility], [address], [place], [place] [address]  CBC with Differential/Platelet     Status: Abnormal   Collection Time: 01/07/23  1:00 PM  Result Value Ref Range   WBC 7.4 4.0 - 10.5 K/uL   RBC 3.77 (L) 4.22 - 5.81 MIL/uL   Hemoglobin 11.5 (L) 13.0 - 17.0 g/dL   HCT 11.9 (L) 14.7 - 82.9 %   MCV 90.2 80.0 - 100.0 fL   MCH 30.5 26.0 - 34.0 pg   MCHC 33.8 30.0 - 36.0 g/dL   RDW 56.2 13.0 - 86.5 %   Platelets 197 150 - 400 K/uL   nRBC 0.0 0.0 - 0.2 %   Neutrophils Relative % 73 %   Neutro Abs 5.4 1.7 - 7.7 K/uL   Lymphocytes Relative 14 %   Lymphs Abs 1.1 0.7 - 4.0 K/uL   Monocytes Relative 8 %   Monocytes Absolute 0.6 0.1 - 1.0 K/uL   Eosinophils Relative 4 %   Eosinophils Absolute 0.3 0.0 - 0.5 K/uL   Basophils Relative 1 %   Basophils Absolute 0.1 0.0 - 0.1 K/uL   Immature Granulocytes 0 %   Abs Immature Granulocytes 0.02 0.00 - 0.07  K/uL    Comment: Performed at [facility], [address], [place], [place] [address]  Hepatic function panel     Status: None   Collection Time: 01/07/23  1:00 PM  Result Value Ref Range   Total Protein 7.2 6.5 - 8.1 g/dL   Albumin 3.6 3.5 - 5.0 g/dL   AST 22 15 - 41 U/L   ALT 18 0 - 44 U/L   Alkaline Phosphatase 85 38 - 126 U/L   Total Bilirubin 0.8 0.3 - 1.2 mg/dL   Bilirubin, Direct 0.1 0.0 - 0.2 mg/dL   Indirect Bilirubin 0.7 0.3 - 0.9 mg/dL    Comment: Performed at [facility], [address], [place], [place] [address]  Brain natriuretic peptide     Status: Abnormal   Collection Time: 01/07/23  1:00 PM  Result Value Ref Range   B Natriuretic Peptide 647.2 (H) 0.0 - 100.0 pg/mL    Comment: Performed at [facility], [address], [place], [place] [address]  Troponin I (High Sensitivity)     Status: Abnormal   Collection Time: 01/07/23  1:00 PM  Result Value Ref Range   Troponin I (High Sensitivity) 252 (HH) <18 ng/L    Comment: CRITICAL RESULT CALLED TO, READ BACK BY AND VERIFIED WITH MYLES L,RN 1433 01/07/2023 SANDOVAL K (NOTE) Elevated high sensitivity troponin I (hsTnI) values and significant  changes across serial measurements may suggest ACS but many other  chronic and acute conditions are known to elevate hsTnI results.  Refer to the "Links" section for chest pain algorithms and additional  guidance. Performed at [facility], [address], [place], [place] [address]   Magnesium     Status: None   Collection Time: 01/07/23  1:00 PM  Result Value Ref Range   Magnesium 2.0 1.7 - 2.4 mg/dL    Comment: Performed at [facility], [address], [place], [place] [address]  I-Stat Chem 8, ED     Status: Abnormal   Collection Time: 01/07/23  1:49 PM  Result Value Ref Range   Sodium 143 135 - 145 mmol/L   Potassium 3.8 3.5 - 5.1 mmol/L   Chloride 110 98 - 111 mmol/L   BUN 45 (H) 6 - 20 mg/dL   Creatinine, Ser 6.96 (H) 0.61 - 1.24  mg/dL   Glucose, Bld 85 70 - 99 mg/dL    Comment: Glucose reference range applies only to samples taken after fasting for at least 8 hours.   Calcium, Ion 1.16 1.15 - 1.40 mmol/L   TCO2 20 (L) 22 - 32 mmol/L   Hemoglobin 11.6 (L) 13.0 - 17.0 g/dL   HCT 29.5 (L) 28.4 - 13.2 %  Troponin I (High Sensitivity)     Status: Abnormal   Collection Time: 01/07/23  3:20 PM  Result Value Ref Range   Troponin I (High Sensitivity) 208 (HH) <18 ng/L    Comment: CRITICAL VALUE NOTED. VALUE IS CONSISTENT WITH PREVIOUSLY REPORTED/CALLED VALUE DELTA CHECK NOTED (NOTE) Elevated high sensitivity troponin I (hsTnI) values and significant  changes across serial measurements may suggest ACS but many other  chronic and acute conditions are known to elevate hsTnI results.  Refer to the "Links" section for chest pain algorithms and additional  guidance. Performed at [facility], [address], [place], [place] [address]   Urinalysis, Routine w reflex microscopic -Urine, Clean Catch     Status: Abnormal   Collection Time: 01/07/23  3:30 PM  Result Value Ref Range   Color, Urine YELLOW YELLOW   APPearance CLEAR CLEAR   Specific Gravity, Urine 1.017 1.005 - 1.030   pH 5.0 5.0 - 8.0   Glucose, UA 50 (A) NEGATIVE mg/dL   Hgb urine dipstick MODERATE (A) NEGATIVE   Bilirubin Urine NEGATIVE NEGATIVE   Ketones, ur NEGATIVE NEGATIVE mg/dL   Protein, ur 272 (A) NEGATIVE mg/dL   Nitrite NEGATIVE NEGATIVE   Leukocytes,Ua NEGATIVE NEGATIVE   RBC / HPF 0-5 0 - 5 RBC/hpf   WBC, UA 0-5 0 - 5 WBC/hpf   Bacteria, UA NONE SEEN NONE SEEN   Squamous Epithelial / HPF 0-5 0 - 5 /HPF    Comment: Performed at [facility], [address] [place], [place] [address]  Procalcitonin     Status: None  Collection Time: 01/07/23  5:38 PM  Result Value Ref Range   Procalcitonin <0.10 ng/mL    Comment:        Interpretation: PCT (Procalcitonin) <= 0.5 ng/mL: Systemic infection (sepsis) is not likely. Local  bacterial infection is possible. (NOTE)       Sepsis PCT Algorithm           Lower Respiratory Tract                                      Infection PCT Algorithm    ----------------------------     ----------------------------         PCT < 0.25 ng/mL                PCT < 0.10 ng/mL          Strongly encourage             Strongly discourage   discontinuation of antibiotics    initiation of antibiotics    ----------------------------     -----------------------------       PCT 0.25 - 0.50 ng/mL            PCT 0.10 - 0.25 ng/mL               OR       >80% decrease in PCT            Discourage initiation of                                            antibiotics      Encourage discontinuation           of antibiotics    ----------------------------     -----------------------------         PCT >= 0.50 ng/mL              PCT 0.26 - 0.50 ng/mL               AND        <80% decrease in PCT             Encourage initiation of                                             antibiotics       Encourage continuation           of antibiotics    ----------------------------     -----------------------------        PCT >= 0.50 ng/mL                  PCT > 0.50 ng/mL               AND         increase in PCT                  Strongly encourage                                      initiation of antibiotics    Strongly encourage escalation  of antibiotics                                     -----------------------------                                           PCT <= 0.25 ng/mL                                                 OR                                        > 80% decrease in PCT                                      Discontinue / Do not initiate                                             antibiotics  Performed at [facility], [address], [place], [place] [address]   CBC     Status: Abnormal   Collection Time: 01/07/23  5:38 PM  Result Value Ref Range   WBC 6.0  4.0 - 10.5 K/uL   RBC 3.28 (L) 4.22 - 5.81 MIL/uL   Hemoglobin 10.0 (L) 13.0 - 17.0 g/dL   HCT 56.2 (L) 13.0 - 86.5 %   MCV 91.2 80.0 - 100.0 fL   MCH 30.5 26.0 - 34.0 pg   MCHC 33.4 30.0 - 36.0 g/dL   RDW 78.4 69.6 - 29.5 %   Platelets 162 150 - 400 K/uL   nRBC 0.0 0.0 - 0.2 %    Comment: Performed at [facility], [address], [place], [place] [address]  Creatinine, serum     Status: Abnormal   Collection Time: 01/07/23  5:38 PM  Result Value Ref Range   Creatinine, Ser 4.11 (H) 0.61 - 1.24 mg/dL   GFR, Estimated 17 (L) >60 mL/min    Comment: (NOTE) Calculated using the CKD-EPI Creatinine Equation (2021) Performed at [facility], [address], [place], [place] [address]   CK     Status: None   Collection Time: 01/07/23  5:38 PM  Result Value Ref Range   Total CK 365 49 - 397 U/L    Comment: Performed at [facility], [address], [place], [place] [address]  Troponin I (High Sensitivity)     Status: Abnormal   Collection Time: 01/07/23 11:08 PM  Result Value Ref Range   Troponin I (High Sensitivity) 162 (HH) <18 ng/L    Comment: CRITICAL VALUE NOTED. VALUE IS CONSISTENT WITH PREVIOUSLY REPORTED/CALLED VALUE (NOTE) Elevated high sensitivity troponin I (hsTnI) values and significant  changes across serial measurements may suggest ACS but many other  chronic and acute conditions are known to elevate hsTnI results.  Refer to the "Links" section  for chest pain algorithms and additional  guidance. Performed at [facility], [address], [place], [place] [address]   CBC     Status: Abnormal   Collection Time: 01/08/23  3:26 AM  Result Value Ref Range   WBC 6.8 4.0 - 10.5 K/uL   RBC 3.34 (L) 4.22 - 5.81 MIL/uL   Hemoglobin 9.9 (L) 13.0 - 17.0 g/dL   HCT 62.8 (L) 31.5 - 17.6 %   MCV 89.2 80.0 - 100.0 fL   MCH 29.6 26.0 - 34.0 pg   MCHC 33.2 30.0 - 36.0 g/dL   RDW 16.0 73.7 - 10.6 %   Platelets 159 150 - 400 K/uL   nRBC 0.0 0.0 - 0.2 %     Comment: Performed at [facility], [address], [place], [place] [address]  Protime-INR     Status: None   Collection Time: 01/08/23  3:26 AM  Result Value Ref Range   Prothrombin Time 14.3 11.4 - 15.2 seconds   INR 1.1 0.8 - 1.2    Comment: (NOTE) INR goal varies based on device and disease states. Performed at [facility], [address], [place], [place] [address]   APTT     Status: None   Collection Time: 01/08/23  3:26 AM  Result Value Ref Range   aPTT 28 24 - 36 seconds    Comment: Performed at [facility], [address] [place], [place] [address]  Basic metabolic panel     Status: Abnormal   Collection Time: 01/08/23  3:26 AM  Result Value Ref Range   Sodium 140 135 - 145 mmol/L   Potassium 3.2 (L) 3.5 - 5.1 mmol/L   Chloride 109 98 - 111 mmol/L   CO2 18 (L) 22 - 32 mmol/L   Glucose, Bld 104 (H) 70 - 99 mg/dL    Comment: Glucose reference range applies only to samples taken after fasting for at least 8 hours.   BUN 43 (H) 6 - 20 mg/dL   Creatinine, Ser 9.38 (H) 0.61 - 1.24 mg/dL   Calcium 8.1 (L) 8.9 - 10.3 mg/dL   GFR, Estimated 18 (L) >60 mL/min    Comment: (NOTE) Calculated using the CKD-EPI Creatinine Equation (2021)    Anion gap 13 5 - 15    Comment: Performed at [facility], [address], [place], [place] [address]  CBG monitoring, ED     Status: Abnormal   Collection Time: 01/08/23 10:27 AM  Result Value Ref Range   Glucose-Capillary 120 (H) 70 - 99 mg/dL    Comment: Glucose reference range applies only to samples taken after fasting for at least 8 hours.   US RENAL  Result Date: 01/07/2023 CLINICAL DATA:  Acute kidney injury EXAM: RENAL / URINARY TRACT ULTRASOUND COMPLETE COMPARISON:  None Available. FINDINGS: Right Kidney: Renal measurements: 8.6 x 4.1 x 5.4 cm = volume: 99.7 mL. Appears slightly echogenic. No mass or hydronephrosis. Left Kidney: Renal measurements: 10.3 x 4.9 x 4.8 cm = volume: 126.1 mL. Appears slightly  echogenic. No mass or hydronephrosis. Bladder: Appears normal for degree of bladder distention. Other: Right pleural effusion IMPRESSION: 1. Negative for hydronephrosis. 2. Slightly echogenic kidneys suggesting medical renal disease. 3. Right pleural effusion. Electronically Signed   By: Jasmine Pang M.D.   On: 01/07/2023 21:37   DG Chest Port 1 View  Result Date: 01/07/2023 CLINICAL DATA:  Dyspnea. EXAM: PORTABLE CHEST 1 VIEW COMPARISON:  Same  day. FINDINGS: Stable cardiomediastinal silhouette. Mild central pulmonary vascular congestion is noted. Mildly increased left basilar opacity is noted concerning for atelectasis or infiltrate with possible minimal left pleural effusion. Right lung is clear. Bony thorax is unremarkable. IMPRESSION: Mild central pulmonary vascular congestion. Mildly increased left basilar opacity as noted above. Electronically Signed   By: Lupita Raider M.D.   On: 01/07/2023 13:44   DG Chest 2 View  Result Date: 01/07/2023 CLINICAL DATA:  Shortness of breath. EXAM: CHEST - 2 VIEW COMPARISON:  October 30, 2022. FINDINGS: Stable cardiomediastinal silhouette. Right lung is clear. Elevated left hemidiaphragm is noted with minimal left basilar subsegmental atelectasis and possible minimal left pleural effusion. Bony thorax is unremarkable. IMPRESSION: Elevated left hemidiaphragm is noted with minimal left basilar subsegmental atelectasis and possible minimal left pleural effusion. Electronically Signed   By: Lupita Raider M.D.   On: 01/07/2023 13:42    Pending Labs Unresulted Labs (From admission, onward)    None       Vitals/Pain Today's Vitals   01/08/23 1330 01/08/23 1331 01/08/23 1345 01/08/23 1400  BP: (!) 173/105  (!) 173/105 (!) 172/91  Pulse: 89  84 82  Resp: 19  (!) 29 (!) 26  Temp:      TempSrc:      SpO2: 100%  100% 100%  PainSc:  0-No pain      Isolation Precautions No active isolations  Medications Medications  nitroGLYCERIN 50 mg in dextrose 5  % 250 mL (0.2 mg/mL) infusion (50 mcg/min Intravenous New Bag/Given 01/08/23 1323)  sertraline (ZOLOFT) tablet 50 mg (50 mg Oral Given 01/08/23 0923)  sodium bicarbonate tablet 650 mg (650 mg Oral Given 01/08/23 0923)  heparin injection 5,000 Units (5,000 Units Subcutaneous Given 01/08/23 0922)  sodium chloride flush (NS) 0.9 % injection 3 mL (3 mLs Intravenous Given 01/08/23 0923)  acetaminophen (TYLENOL) tablet 650 mg (650 mg Oral Given 01/08/23 0626)    Or  acetaminophen (TYLENOL) suppository 650 mg ( Rectal See Alternative 01/08/23 0626)  senna-docusate (Senokot-S) tablet 1 tablet (has no administration in time range)  dapagliflozin propanediol (FARXIGA) tablet 10 mg (10 mg Oral Given 01/08/23 0923)  hydrALAZINE (APRESOLINE) tablet 50 mg (50 mg Oral Given 01/08/23 1321)  amLODipine (NORVASC) tablet 10 mg (10 mg Oral Given 01/08/23 0923)  carvedilol (COREG) tablet 25 mg (has no administration in time range)  labetalol (NORMODYNE) injection 10 mg (10 mg Intravenous Given 01/07/23 1321)  labetalol (NORMODYNE) injection 20 mg (20 mg Intravenous Given 01/07/23 1403)  cefTRIAXone (ROCEPHIN) 1 g in sodium chloride 0.9 % 100 mL IVPB (0 g Intravenous Stopped 01/07/23 1603)  azithromycin (ZITHROMAX) 500 mg in sodium chloride 0.9 % 250 mL IVPB (0 mg Intravenous Stopped 01/07/23 1724)  furosemide (LASIX) injection 40 mg (40 mg Intravenous Given 01/07/23 1736)  potassium chloride SA (KLOR-CON M) CR tablet 20 mEq (20 mEq Oral Given 01/08/23 1124)  furosemide (LASIX) injection 40 mg (40 mg Intravenous Given 01/08/23 1039)    Mobility walks     Focused Assessments Cardiac Assessment Handoff:  Cardiac Rhythm: Normal sinus rhythm Lab Results  Component Value Date   CKTOTAL 365 01/07/2023   TROPONINI <0.30 09/29/2013   No results found for: "DDIMER" Does the Patient currently have chest pain? No    R Recommendations: See Admitting Provider Note  Report given to:   Additional Notes:  nitroglycerin gtt

## 2023-01-09 ENCOUNTER — Inpatient Hospital Stay (HOSPITAL_COMMUNITY): Payer: Commercial Managed Care - HMO

## 2023-01-09 DIAGNOSIS — R9431 Abnormal electrocardiogram [ECG] [EKG]: Secondary | ICD-10-CM | POA: Diagnosis not present

## 2023-01-09 DIAGNOSIS — I5033 Acute on chronic diastolic (congestive) heart failure: Secondary | ICD-10-CM

## 2023-01-09 DIAGNOSIS — I161 Hypertensive emergency: Secondary | ICD-10-CM | POA: Diagnosis not present

## 2023-01-09 DIAGNOSIS — N184 Chronic kidney disease, stage 4 (severe): Secondary | ICD-10-CM | POA: Diagnosis not present

## 2023-01-09 DIAGNOSIS — I10 Essential (primary) hypertension: Secondary | ICD-10-CM

## 2023-01-09 LAB — COMPREHENSIVE METABOLIC PANEL
ALT: 18 U/L (ref 0–44)
AST: 16 U/L (ref 15–41)
Albumin: 3.7 g/dL (ref 3.5–5.0)
Alkaline Phosphatase: 80 U/L (ref 38–126)
Anion gap: 11 (ref 5–15)
BUN: 40 mg/dL — ABNORMAL HIGH (ref 6–20)
CO2: 20 mmol/L — ABNORMAL LOW (ref 22–32)
Calcium: 8.3 mg/dL — ABNORMAL LOW (ref 8.9–10.3)
Chloride: 105 mmol/L (ref 98–111)
Creatinine, Ser: 4.15 mg/dL — ABNORMAL HIGH (ref 0.61–1.24)
GFR, Estimated: 17 mL/min — ABNORMAL LOW (ref >60)
Glucose, Bld: 102 mg/dL — ABNORMAL HIGH (ref 70–99)
Potassium: 3.3 mmol/L — ABNORMAL LOW (ref 3.5–5.1)
Sodium: 136 mmol/L (ref 135–145)
Total Bilirubin: 0.6 mg/dL (ref 0.3–1.2)
Total Protein: 7.1 g/dL (ref 6.5–8.1)

## 2023-01-09 LAB — CBC WITH DIFFERENTIAL/PLATELET
Abs Immature Granulocytes: 0.02 10*3/uL (ref 0.00–0.07)
Basophils Absolute: 0.1 10*3/uL (ref 0.0–0.1)
Basophils Relative: 1 %
Eosinophils Absolute: 0.3 10*3/uL (ref 0.0–0.5)
Eosinophils Relative: 5 %
HCT: 32.6 % — ABNORMAL LOW (ref 39.0–52.0)
Hemoglobin: 11 g/dL — ABNORMAL LOW (ref 13.0–17.0)
Immature Granulocytes: 0 %
Lymphocytes Relative: 16 %
Lymphs Abs: 1.1 10*3/uL (ref 0.7–4.0)
MCH: 29.6 pg (ref 26.0–34.0)
MCHC: 33.7 g/dL (ref 30.0–36.0)
MCV: 87.9 fL (ref 80.0–100.0)
Monocytes Absolute: 0.4 10*3/uL (ref 0.1–1.0)
Monocytes Relative: 5 %
Neutro Abs: 5.1 10*3/uL (ref 1.7–7.7)
Neutrophils Relative %: 73 %
Platelets: 205 10*3/uL (ref 150–400)
RBC: 3.71 MIL/uL — ABNORMAL LOW (ref 4.22–5.81)
RDW: 13.2 % (ref 11.5–15.5)
WBC: 7 10*3/uL (ref 4.0–10.5)
nRBC: 0 % (ref 0.0–0.2)

## 2023-01-09 LAB — ECHOCARDIOGRAM COMPLETE
Area-P 1/2: 5.66 cm2
S' Lateral: 3.5 cm
Weight: 3249.6 [oz_av]

## 2023-01-09 MED ORDER — POTASSIUM CHLORIDE CRYS ER 20 MEQ PO TBCR
20.0000 meq | EXTENDED_RELEASE_TABLET | Freq: Once | ORAL | Status: AC
Start: 2023-01-09 — End: 2023-01-09
  Administered 2023-01-09: 20 meq via ORAL
  Filled 2023-01-09: qty 1

## 2023-01-09 MED ORDER — CARVEDILOL 25 MG PO TABS
37.5000 mg | ORAL_TABLET | Freq: Two times a day (BID) | ORAL | Status: DC
  Administered 2023-01-09 – 2023-01-11 (×5): 37.5 mg via ORAL
  Filled 2023-01-09 (×5): qty 1

## 2023-01-09 MED ORDER — TORSEMIDE 20 MG PO TABS
20.0000 mg | ORAL_TABLET | Freq: Every day | ORAL | Status: DC
Start: 2023-01-09 — End: 2023-01-11
  Administered 2023-01-09 – 2023-01-11 (×3): 20 mg via ORAL
  Filled 2023-01-09 (×3): qty 1

## 2023-01-09 MED ORDER — CARVEDILOL 25 MG PO TABS: 37.5000 mg | ORAL_TABLET | Freq: Two times a day (BID) | ORAL | Status: DC

## 2023-01-09 MED ORDER — HYDRALAZINE HCL 50 MG PO TABS
50.0000 mg | ORAL_TABLET | Freq: Two times a day (BID) | ORAL | Status: DC
  Administered 2023-01-09 – 2023-01-11 (×4): 50 mg via ORAL
  Filled 2023-01-09 (×4): qty 1

## 2023-01-09 NOTE — ED Provider Notes (Signed)
[facility]   [id number] 01/07/23 Arrival Time: 0911  ASSESSMENT & PLAN:  1. Hypertensive crisis   2. SOB (shortness of breath)   3. Tachypnea    Limited diagnostic and treatment options here. Feel he needs hight level of care for SOB/hypertensive crisis. To ED via [facility]. Stable upon discharge. I have not given him any blood pressure medications here. Denies CP but remains SOB..  I have personally viewed and independently interpreted the imaging studies ordered this visit. CXR: Ques L sided early infiltrate.  Reviewed expectations re: course of current medical issues. Questions answered. Outlined signs and symptoms indicating need for more acute intervention. Patient verbalized understanding. After Visit Summary given.   SUBJECTIVE:  Mark Hayden is a 47 y.o. male who presents with concerns of feeling SOB; over past week; gradual onset. Mild dry cough. Denies fever. SOB worse when supine. Denies CP/n/v/diaphoresis. SOB worse over past two days. H/O PNA. Increased blood pressure noted today. Reports that he has not taken any BP meds today. Denies HA or visual changes. Denies LE edema or recent wt gain.  OBJECTIVE:  Vitals:   01/07/23 1055 01/07/23 1208  BP: (!) 230/137 (!) 229/145  Pulse: 90   Resp: (!) 24   Temp: 98.7 F (37.1 C)   TempSrc: Oral   SpO2: 97%     General appearance: alert; no distress Eyes: PERRLA; EOMI HENT: normocephalic; atraumatic Neck: supple Lungs: coarse breath sounds bilaterally; tachypnea noted Heart: regular  Chest Wall: non-tender Abdomen: soft, non-tender; bowel sounds normal Extremities: no edema; symmetrical with no gross deformities Skin: warm and dry Psychological: alert and cooperative; normal mood and affect  ECG: NSR; no significant change from previous ECG in system when interpreted by me   Imaging:  DG Chest 2 View  Result Date: 01/07/2023 CLINICAL DATA:  Shortness of breath. EXAM: CHEST - 2  VIEW COMPARISON:  October 30, 2022. FINDINGS: Stable cardiomediastinal silhouette. Right lung is clear. Elevated left hemidiaphragm is noted with minimal left basilar subsegmental atelectasis and possible minimal left pleural effusion. Bony thorax is unremarkable. IMPRESSION: Elevated left hemidiaphragm is noted with minimal left basilar subsegmental atelectasis and possible minimal left pleural effusion. Electronically Signed   By: Lupita Raider M.D.   On: 01/07/2023 13:42    Allergies  Allergen Reactions   Fish Allergy Anaphylaxis and Itching   Shellfish Allergy Anaphylaxis and Itching    seafood    Past Medical History:  Diagnosis Date   CHF (congestive heart failure) (HCC)    Hypertension    Hypertensive emergency 08/20/2022   Pneumonia    Sleep apnea    Suicidal thoughts 09/08/2019   Social History   Socioeconomic History   Marital status: Single    Spouse name: Not on file   Number of children: 1   Years of education: Not on file   Highest education level: High school graduate  Occupational History   Occupation: [employer]  Tobacco Use   Smoking status: Former   Smokeless tobacco: Never  Advertising account planner   Vaping status: Never Used  Substance and Sexual Activity   Alcohol use: No   Drug use: Yes    Types: Marijuana   Sexual activity: Not on file  Other Topics Concern   Not on file  Social History Narrative   Not on file   Social Determinants of Health   Financial Resource Strain: High Risk (09/12/2022)   Overall Financial Resource Strain (CARDIA)    Difficulty of Paying Living Expenses:  Very hard  Food Insecurity: No Food Insecurity (01/07/2023)   Hunger Vital Sign    Worried About Running Out of Food in the Last Year: Never true    Ran Out of Food in the Last Year: Never true  Transportation Needs: No Transportation Needs (01/07/2023)   PRAPARE - Administrator, Civil Service (Medical): No    Lack of Transportation (Non-Medical): No  Physical  Activity: Not on file  Stress: Not on file  Social Connections: Not on file  Intimate Partner Violence: Not At Risk (01/07/2023)   Humiliation, Afraid, Rape, and Kick questionnaire    Fear of Current or Ex-Partner: No    Emotionally Abused: No    Physically Abused: No    Sexually Abused: No   Family History  Problem Relation Age of Onset   Diabetes Mother    Diabetes Father    History reviewed. No pertinent surgical history.     Mardella Layman, MD 01/09/23 [phone]

## 2023-01-09 NOTE — Plan of Care (Signed)
  Problem: Elimination: Goal: Will not experience complications related to bowel motility Outcome: Progressing   Problem: Elimination: Goal: Will not experience complications related to urinary retention Outcome: Progressing   Problem: Pain Management: Goal: General experience of comfort will improve Outcome: Progressing   Problem: Safety: Goal: Ability to remain free from injury will improve Outcome: Progressing   Problem: Skin Integrity: Goal: Risk for impaired skin integrity will decrease Outcome: Progressing

## 2023-01-09 NOTE — Progress Notes (Signed)
Mobility Specialist Progress Note:   01/09/23 1142  Mobility  Activity Ambulated with assistance in hallway  Level of Assistance Standby assist, set-up cues, supervision of patient - no hands on  Assistive Device None  Distance Ambulated (ft) 400 ft  Activity Response Tolerated well  Mobility Referral Yes  $Mobility charge 1 Mobility  Mobility Specialist Start Time (ACUTE ONLY) 1125  Mobility Specialist Stop Time (ACUTE ONLY) 1135  Mobility Specialist Time Calculation (min) (ACUTE ONLY) 10 min   Pre Mobility:  116/61 BP   Pt received in bed, agreeable to mobility. C/o slight lightheadedness during ambulation, otherwise asymptomatic throughout. Pt returned to bed with call bell in reach and all needs met.  Mark Hayden  Mobility Specialist Please contact via [employer] office at [phone]

## 2023-01-09 NOTE — Progress Notes (Signed)
Subjective: Patient seen after his renal artery [place] this morning. He reports that he is doing much better since his admission. Patient denies any chest pain, blurry vision, headache, or shortness of breath. He worked with mobility team today and did note some lightheadedness when ambulating and muscle cramps. We updated him about his echo results and improved cardiac function. Patient was encourage to continue working with PT/OT to ensure he is tolerating his new blood pressure regimen. He was also advised on the importance of staying compliant with his medications.  Objective:  Vital signs in last 24 hours: Vitals:   01/08/23 2337 01/09/23 0334 01/09/23 0400 01/09/23 0500  BP: (!) 163/108 (!) 200/116 (!) 153/80   Pulse: 83 89 81 91  Resp: 20 20 19  (!) 23  Temp:   98.6 F (37 C)   TempSrc:   Oral   SpO2: 100% 100% 99% 97%  Weight:    92.1 kg   Weight change:   Intake/Output Summary (Last 24 hours) at 01/09/2023 0733 Last data filed at 01/09/2023 0245 Gross per 24 hour  Intake 1107.52 ml  Output 2650 ml  Net -1542.48 ml    Physical Exam Constitutional:      General: He is not in acute distress. HENT:     Head: Normocephalic and atraumatic.  Cardiovascular:     Rate and Rhythm: Normal rate and regular rhythm.     Pulses: Normal pulses.     Heart sounds: Normal heart sounds.  Pulmonary:     Effort: Pulmonary effort is normal.     Breath sounds: Normal breath sounds.  Abdominal:     General: Abdomen is flat. Bowel sounds are normal.  Musculoskeletal:     Right lower leg: No edema.     Left lower leg: No edema.  Skin:    General: Skin is warm.  Neurological:     Mental Status: He is alert and oriented to person, place, and time.      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 He was having chest pain last night until this morning. Denies any blurry vision, lightheadedness, nausea, vomiting, or current chest pain. Patient given 1 dose IV labetalol 10 mg and 1 dose IV labetalol 20 mg. Patient was started on Nitroglycerin gtt and did endorse a headache after starting the medication.   Patient notes that he has not taken any of his home medications for the past 1-2 weeks. He believes that his amlodipine does not let him sleep at night and that he becomes diaphoretic and dizzy after taking the medication.   Plan: -Cardiology was consulted for patients increased heart score and troponin elevation. We are continuing to follow their recommendations. -Nitroglycerin gtt titrated down and discontinued 10/22 after patient reached goal BP <160/110. Patient transitioned to Imdur 60. -Renal artery [place] scheduled for 9 am today. -Tylenol 650 Q6 PRN for pain -Patient received two dose IV Lasix 40 -Continue amlodipine 10 -Continue carvedilol 25 BID -Continue hydralazine 50 BID -Will continue to monitor BP with the po medication transition   Troponin Elevation Troponin 252 and 208. Not likely due to ACS. Likely due to demand ischemia. Patient had chest pain overnight and denies any current chest pain. Initial EKG showed sinus rhythm without any signs of acute cardiac ischemia. Telemetry with ST segment depressions. Given troponemia and moderate HEART score, cardiology  consulted; spoke with Dr. Flora Lipps. Will follow up EKG, telemetry changes, and troponins until flat.  Plan: -Will order repeat EKG. Repeat EKG did not show any changes. -Continuing to trend troponin. Last troponin 168. -Continuing cardiac monitoring -Complete echo ordered. EF improved to 60-65% and mild MR noted.   Community Acquired Pneumonia Patient has been having productive cough for the last two weeks with chills and gradually increased shortness of breath. CXR showed mild pulmonary vascular congestion and  left basilar opacity.Patient treated two months ago for right sided multifocal pneumonia. He was treated with a 14 day course of Augmentin. Patient states that this is third episode of pneumonia this year and denies any. COVID, RSV, and Influenza panels are negative. He remains afebrile with no leukocytosis. No crackles heard on exam. WBC is 7.4 on admission. Patient was started on IV Ceftriaxone and IV Azithromycin.  WBC was 6.8 today. Procalcitonin was normal. Plan:  -Will discontinue IV Ceftriaxone and Azithromycin -Will get CT Chest while admitted due to history of recurrent pneumonia. Last CT Chest was 10/2022 -Monitoring CBC     CHF with mildly reduced EF of 45-50% Patient did not have any current shortness of breath. CXR showed mild pulmonary vascular congestion. No crackles or rales on exam. BNP was 647.2. Patient has not been taking his home torsemide 10 2 tablets. Patient was seen by [facility] heart failure and found to not meet criteria for their team due to established care with Dr. Gasper Lloyd.  Repeat Echo 10/23 showed: 1. Left ventricular ejection fraction, by estimation, is 60 to 65%. The  left ventricle has normal function. The left ventricle has no regional  wall motion abnormalities. There is moderate concentric left ventricular  hypertrophy. Left ventricular  diastolic parameters are consistent with Grade II diastolic dysfunction  (pseudonormalization). The average left ventricular global longitudinal  strain is 9.1 %. GLS appears underestimated.   2. Right ventricular systolic function is normal. The right ventricular  size is normal. There is mildly elevated pulmonary artery systolic  pressure.   3. Left atrial size was moderately dilated.   4. Right atrial size was mildly dilated.   5. The mitral valve is normal in structure. Mild mitral valve  regurgitation. No evidence of mitral stenosis.   6. The aortic valve is tricuspid. There is mild calcification of the  aortic  valve. Aortic valve regurgitation is not visualized. Aortic valve  sclerosis/calcification is present, without any evidence of aortic  stenosis.   7. The inferior vena cava is normal in size with greater than 50%  respiratory variability, suggesting right atrial pressure of 3 mmHg.  Plan: -Patient received one dose lasix IV 40 mg -Complete echo 10/23 results above -Continue Farxiga 10     AKI on CKD Stage IV Cr was 4.1 and 4.5 in the ED. Baseline Cr is 3.89. Renal US performed 07/2022 was normal. UA showed moderate Hgb and proteinuria. Microscopy did not show any bacteria. -Repeat renal US ordered. Did not show hydronephrosis but showed echogenic kidneys. -Continuing to monitor Cr -Restarted home sodium bicarb 650 BID -Ordered CK. CK 365.   OSA  Patient notes episodes of apnea that wakes him up at night. He does not use a CPAP or BiPAP machine at home. He had a sleep study done in 10/2022 that showed severe sleep apnea and recommendation of auto-CPAP and possible ENT evaluation. This may be contributing to hypertension. Plan: -Continuing to monitor respiratory status -Will arrange CPAP machine before patient leaves   Depression -Started  home Sertraline 50   Diet: Heart healthy VTE: Subcutaneous Heparin Code: DNR - does not want family to know about this choice   Dispo: Admit patient to Inpatient with expected length of stay greater than 2 midnights.     [name], MS4   LOS: 1 day   [name], Medical Student 01/09/2023, 7:33 AM

## 2023-01-09 NOTE — Progress Notes (Signed)
   Patient Name: Mark Hayden Date of Encounter: 01/09/2023 [facility] Cardiologist: None   Interval Summary  .    Feels OK. BP still high. Echo shows subtle signs of systolic dysfunction (normal EF, reduced global longitudinal strain) and significant diastolic dysfunction with elevated left atrial pressures. Awaiting renal duplex US.  Vital Signs .    Vitals:   01/09/23 0334 01/09/23 0400 01/09/23 0500 01/09/23 0746  BP: (!) 200/116 (!) 153/80  (!) 157/103  Pulse: 89 81 91 90  Resp: 20 19 (!) 23 18  Temp:  98.6 F (37 C)  98 F (36.7 C)  TempSrc:  Oral  Oral  SpO2: 100% 99% 97% 100%  Weight:   92.1 kg     Intake/Output Summary (Last 24 hours) at 01/09/2023 0934 Last data filed at 01/09/2023 0700 Gross per 24 hour  Intake 1107.52 ml  Output 2800 ml  Net -1692.48 ml      01/09/2023    5:00 AM 11/15/2022   10:44 AM 11/12/2022    3:39 PM  Last 3 Weights  Weight (lbs) 203 lb 1.6 oz 213 lb 8 oz 209 lb 3.2 oz  Weight (kg) 92.126 kg 96.843 kg 94.892 kg      Telemetry/ECG    NSR - Personally Reviewed  Physical Exam .   GEN: No acute distress.   Neck: No JVD Cardiac: RRR, no murmurs, rubs. S3 softer  Respiratory: Clear to auscultation bilaterally. GI: Soft, nontender, non-distended  MS: No edema  Assessment & Plan .     Increase carvedilol dose. Start PO torsemide. Renal duplex US pending.  For questions or updates, please contact [facility] Please consult [email or web address] for contact info under        Signed, Thurmon Fair, MD

## 2023-01-09 NOTE — Progress Notes (Signed)
Echocardiogram 2D Echocardiogram has been performed.  Reinaldo Raddle Gracyn Allor 01/09/2023, 8:45 AM

## 2023-01-10 ENCOUNTER — Telehealth: Payer: Self-pay

## 2023-01-10 ENCOUNTER — Other Ambulatory Visit: Payer: Self-pay

## 2023-01-10 DIAGNOSIS — G4733 Obstructive sleep apnea (adult) (pediatric): Secondary | ICD-10-CM

## 2023-01-10 DIAGNOSIS — R0683 Snoring: Secondary | ICD-10-CM

## 2023-01-10 DIAGNOSIS — I10 Essential (primary) hypertension: Secondary | ICD-10-CM | POA: Diagnosis not present

## 2023-01-10 DIAGNOSIS — I5033 Acute on chronic diastolic (congestive) heart failure: Secondary | ICD-10-CM | POA: Diagnosis not present

## 2023-01-10 DIAGNOSIS — I161 Hypertensive emergency: Secondary | ICD-10-CM | POA: Diagnosis not present

## 2023-01-10 LAB — CBC WITH DIFFERENTIAL/PLATELET
Abs Immature Granulocytes: 0.02 10*3/uL (ref 0.00–0.07)
Basophils Absolute: 0.1 10*3/uL (ref 0.0–0.1)
Basophils Relative: 1 %
Eosinophils Absolute: 0.3 10*3/uL (ref 0.0–0.5)
Eosinophils Relative: 5 %
HCT: 30.4 % — ABNORMAL LOW (ref 39.0–52.0)
Hemoglobin: 10.3 g/dL — ABNORMAL LOW (ref 13.0–17.0)
Immature Granulocytes: 0 %
Lymphocytes Relative: 19 %
Lymphs Abs: 1.1 10*3/uL (ref 0.7–4.0)
MCH: 30.4 pg (ref 26.0–34.0)
MCHC: 33.9 g/dL (ref 30.0–36.0)
MCV: 89.7 fL (ref 80.0–100.0)
Monocytes Absolute: 0.5 10*3/uL (ref 0.1–1.0)
Monocytes Relative: 8 %
Neutro Abs: 4 10*3/uL (ref 1.7–7.7)
Neutrophils Relative %: 67 %
Platelets: 176 10*3/uL (ref 150–400)
RBC: 3.39 MIL/uL — ABNORMAL LOW (ref 4.22–5.81)
RDW: 13.3 % (ref 11.5–15.5)
WBC: 5.9 10*3/uL (ref 4.0–10.5)
nRBC: 0 % (ref 0.0–0.2)

## 2023-01-10 LAB — COMPREHENSIVE METABOLIC PANEL
ALT: 17 U/L (ref 0–44)
AST: 16 U/L (ref 15–41)
Albumin: 3.3 g/dL — ABNORMAL LOW (ref 3.5–5.0)
Alkaline Phosphatase: 78 U/L (ref 38–126)
Anion gap: 8 (ref 5–15)
BUN: 40 mg/dL — ABNORMAL HIGH (ref 6–20)
CO2: 20 mmol/L — ABNORMAL LOW (ref 22–32)
Calcium: 8.1 mg/dL — ABNORMAL LOW (ref 8.9–10.3)
Chloride: 108 mmol/L (ref 98–111)
Creatinine, Ser: 4.47 mg/dL — ABNORMAL HIGH (ref 0.61–1.24)
GFR, Estimated: 16 mL/min — ABNORMAL LOW (ref >60)
Glucose, Bld: 99 mg/dL (ref 70–99)
Potassium: 3.6 mmol/L (ref 3.5–5.1)
Sodium: 136 mmol/L (ref 135–145)
Total Bilirubin: 0.5 mg/dL (ref 0.3–1.2)
Total Protein: 6.6 g/dL (ref 6.5–8.1)

## 2023-01-10 NOTE — Telephone Encounter (Addendum)
**Note De-Identified Ellajane Stong Obfuscation** I called [employer] at [phone] and started a URGENT CPAP Titration PA with Troy Sine. Case #:[id number] Per Molli Hazard, urgent request will take 24 to 72 hrs for a determination and that they will fax [place] a determination letter to [phone].  I have faxed Clinical notes to [phone] as advised by Lysle Morales.  -------Fax Transmission Report-------  To:               Recipient at [phone] Subject:          Fw: Case #: [id number] Result:           The transmission was successful. Explanation:      All Pages Ok Pages Sent:       6

## 2023-01-10 NOTE — Consult Note (Addendum)
Hospital Consult    Reason for Consult: Mesenteric artery stenosis Requesting Physician: Internal medicine MRN #:  [id number]  History of Present Illness: This is a 47 y.o. male admitted to the hospital with hypertensive emergency and pneumonia.  Workup for uncontrolled hypertension included renal artery duplex.  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  He has no history of postprandial pain, food fear, or weight loss.  He is also without claudication, rest pain, or tissue loss of bilateral lower extremities.  He quit smoking about 8 years ago.  Past Medical History:  Diagnosis Date   CHF (congestive heart failure) (HCC)    Hypertension    Hypertensive emergency 08/20/2022   Pneumonia    Sleep apnea    Suicidal thoughts 09/08/2019    History reviewed. No pertinent surgical history.  Allergies  Allergen Reactions   Fish Allergy Anaphylaxis and Itching   Shellfish Allergy Anaphylaxis and Itching    seafood    Prior to Admission medications   Medication Sig Start Date End Date Taking? Authorizing Provider  amLODipine (NORVASC) 10 MG tablet Take 1 tablet (10 mg total) by mouth daily. 11/12/22 02/10/23 Yes Sabharwal, Aditya, DO  carvedilol (COREG) 25 MG tablet Take 1 tablet (25 mg total) by mouth 2 (two) times daily. 10/30/22 10/30/23 Yes Atway, Rayann N, DO  dapagliflozin propanediol (FARXIGA) 10 MG TABS tablet Take 1 tablet (10 mg total) by mouth daily before breakfast. 11/12/22  Yes Sabharwal, Aditya, DO  hydrALAZINE (APRESOLINE) 100 MG tablet Take 1 tablet (100 mg total) by mouth 3 (three) times daily. 10/30/22 01/07/23 Yes Atway, Rayann N, DO  isosorbide mononitrate (IMDUR) 60 MG 24 hr tablet Take 1.5 tablets (90 mg total) by mouth daily. 10/30/22 01/07/23 Yes Atway, Rayann N, DO  potassium chloride (KLOR-CON) 10 MEQ tablet Take 1 tablet (10 mEq total) by mouth daily. 09/05/22 01/07/23 Yes Andrey Farmer, PA-C  sertraline  (ZOLOFT) 50 MG tablet Take 1 tablet (50 mg total) by mouth daily. 11/15/22 11/15/23 Yes Morrie Sheldon, MD  sodium bicarbonate 650 MG tablet Take 1 tablet (650 mg total) by mouth 2 (two) times daily. 09/03/22  Yes Ghimire, Werner Lean, MD  torsemide (DEMADEX) 10 MG tablet Take 2 tablets (20 mg total) by mouth daily. 11/02/22 11/02/23 Yes Champ Mungo, DO  Blood Pressure Monitor DEVI Use to check blood pressure daily 09/10/22   Steffanie Rainwater, MD    Social History   Socioeconomic History   Marital status: Single    Spouse name: Not on file   Number of children: 1   Years of education: Not on file   Highest education level: High school graduate  Occupational History   Occupation: [employer]  Tobacco Use   Smoking status: Former   Smokeless tobacco: Never  Vaping Use   Vaping status: Never Used  Substance and Sexual Activity   Alcohol use: No   Drug use: Yes    Types: Marijuana   Sexual activity: Not on file  Other Topics Concern   Not on file  Social History Narrative   Not on file   Social Determinants of Health   Financial Resource Strain: High Risk (09/12/2022)   Overall Financial Resource Strain (CARDIA)    Difficulty of Paying Living Expenses: Very hard  Food Insecurity: No Food Insecurity (01/07/2023)   Hunger Vital Sign    Worried About Running Out of Food in the Last Year: Never true    Ran Out  of Food in the Last Year: Never true  Transportation Needs: No Transportation Needs (01/07/2023)   PRAPARE - Administrator, Civil Service (Medical): No    Lack of Transportation (Non-Medical): No  Physical Activity: Not on file  Stress: Not on file  Social Connections: Not on file  Intimate Partner Violence: Not At Risk (01/07/2023)   Humiliation, Afraid, Rape, and Kick questionnaire    Fear of Current or Ex-Partner: No    Emotionally Abused: No    Physically Abused: No    Sexually Abused: No     Family History  Problem Relation Age of Onset   Diabetes  Mother    Diabetes Father     ROS: Otherwise negative unless mentioned in HPI  Physical Examination  Vitals:   01/10/23 0429 01/10/23 0826  BP: (!) 164/92 137/82  Pulse:  80  Resp: 12 20  Temp: (!) 97.4 F (36.3 C) (!) 97.5 F (36.4 C)  SpO2: 100% 99%   Body mass index is 28.42 kg/m.  General:  WDWN in NAD Gait: Not observed HENT: WNL, normocephalic Pulmonary: normal non-labored breathing, without Rales, rhonchi,  wheezing Cardiac: regular Abdomen:  soft, NT/ND, no masses Skin: without rashes Vascular Exam/Pulses: Symmetrical radial and DP pulses Extremities: without ischemic changes, without Gangrene , without cellulitis; without open wounds;  Musculoskeletal: no muscle wasting or atrophy  Neurologic: A&O X 3;  No focal weakness or paresthesias are detected; speech is fluent/normal Psychiatric:  The pt has Normal affect. Lymph:  Unremarkable  CBC    Component Value Date/Time   WBC 5.9 01/10/2023 0428   RBC 3.39 (L) 01/10/2023 0428   HGB 10.3 (L) 01/10/2023 0428   HCT 30.4 (L) 01/10/2023 0428   PLT 176 01/10/2023 0428   MCV 89.7 01/10/2023 0428   MCH 30.4 01/10/2023 0428   MCHC 33.9 01/10/2023 0428   RDW 13.3 01/10/2023 0428   LYMPHSABS 1.1 01/10/2023 0428   MONOABS 0.5 01/10/2023 0428   EOSABS 0.3 01/10/2023 0428   BASOSABS 0.1 01/10/2023 0428    BMET    Component Value Date/Time   NA 136 01/10/2023 0428   NA 141 11/02/2022 1010   K 3.6 01/10/2023 0428   CL 108 01/10/2023 0428   CO2 20 (L) 01/10/2023 0428   GLUCOSE 99 01/10/2023 0428   BUN 40 (H) 01/10/2023 0428   BUN 35 (H) 11/02/2022 1010   CREATININE 4.47 (H) 01/10/2023 0428   CALCIUM 8.1 (L) 01/10/2023 0428   GFRNONAA 16 (L) 01/10/2023 0428   GFRAA >60 09/09/2019 0624    COAGS: Lab Results  Component Value Date   INR 1.1 01/08/2023     Non-Invasive Vascular Imaging:   Renal duplex with incidental finding of celiac artery peak systolic velocity above 500 cm/s Proximal SMA with peak  systolic velocity of 276 cm/s   ASSESSMENT/PLAN: 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  Workup for uncontrolled hypertension included renal artery duplex which was negative for renal artery stenosis however incidental finding of celiac artery stenosis was noted.  This is asymptomatic currently.  He is without postprandial pain, food fear, and weight loss.  No indication for angiography or intervention at this time.  We will follow this as an outpatient.  On-call vascular surgeon Dr. Lenell Hayden will evaluate the patient later today and provide further treatment plans.   Mark Rutter PA-C [facility] [phone]  VASCULAR  STAFF ADDENDUM: I have independently interviewed and examined the patient. I agree with the above.  Recommend best medical therapy for atheroclerosis Follow up in 6 months with repeat duplex. Please call for questions.  Mark Brunt. Lenell Antu, MD [facility] Phone Number: [phone] 01/10/2023 3:32 PM

## 2023-01-10 NOTE — Plan of Care (Signed)

## 2023-01-10 NOTE — Progress Notes (Signed)
   Patient Name: Mark Hayden Date of Encounter: 01/10/2023 [facility] Cardiologist: None   Interval Summary  .    BP greatly improved. No dyspnea. Mild dizziness.  Vital Signs .    Vitals:   01/09/23 1920 01/09/23 2332 01/10/23 0429 01/10/23 0826  BP: 115/71 (!) 149/98 (!) 164/92 137/82  Pulse:    80  Resp: 20 20 12 20   Temp: 97.6 F (36.4 C) 97.6 F (36.4 C) (!) 97.4 F (36.3 C) (!) 97.5 F (36.4 C)  TempSrc: Oral Oral Oral Oral  SpO2: 100%  100% 99%  Weight:   92.4 kg     Intake/Output Summary (Last 24 hours) at 01/10/2023 0912 Last data filed at 01/09/2023 2200 Gross per 24 hour  Intake 120 ml  Output 825 ml  Net -705 ml      01/10/2023    4:29 AM 01/09/2023    5:00 AM 11/15/2022   10:44 AM  Last 3 Weights  Weight (lbs) 203 lb 12.8 oz 203 lb 1.6 oz 213 lb 8 oz  Weight (kg) 92.443 kg 92.126 kg 96.843 kg      Telemetry/ECG    NSR - Personally Reviewed  Physical Exam .   GEN: No acute distress.   Neck: No JVD Cardiac: RRR, no murmurs, rubs, or gallops.  Respiratory: Clear to auscultation bilaterally. GI: Soft, nontender, non-distended  MS: No edema  Assessment & Plan .     I think he is as close to euvolemia as we can get without further deterioration in kidney function. Would set a "dry weight" marker of 204 lb.  BP control is much improved, albeit a little volatile. No evidence of renal artery stenosis.  [facility] will sign off.   Medication Recommendations:   Farxiga 10 mg daily  Carvedilol 37.5 mg twice daily Hydralazine 50 mg twice daily Amlodipine 10 mg daily (at bedtime) Isosorbide mononitrate 60 mg daily (at bedtime) Torsemide 20 mg daily.  If weight exceeds 205 pounds take 40 mg daily, until weight is under 205 pounds. Other recommendations (labs, testing, etc): Sodium restricted diet.  Daily weights.  Adjust torsemide dose for weight as above. Follow up as an outpatient: We will make arrangements for  follow-up in the heart failure clinic.  Will also try to reschedule the planned cardiac MRI and outpatient CPAP titration study.   For questions or updates, please contact [facility] Please consult [email or web address] for contact info under        Signed, Thurmon Fair, MD

## 2023-01-10 NOTE — Progress Notes (Signed)
Mobility Specialist Progress Note:   01/10/23 1606  Mobility  Activity Ambulated with assistance in hallway  Level of Assistance Standby assist, set-up cues, supervision of patient - no hands on  Assistive Device None  Distance Ambulated (ft) 400 ft  Activity Response Tolerated well  Mobility Referral Yes  $Mobility charge 1 Mobility  Mobility Specialist Start Time (ACUTE ONLY) 1557  Mobility Specialist Stop Time (ACUTE ONLY) 1605  Mobility Specialist Time Calculation (min) (ACUTE ONLY) 8 min   Pre Mobility: 78 HR , 117/64 BP  During Mobility: 83 HR Post Mobility: 80 HR   Pt received in bed, agreeable to mobility. No complaints during ambulation, asymptomatic throughout. Pt returned to bed with call bell in reach and all needs met.   Leory Plowman  Mobility Specialist Please contact via [employer] office at [phone]

## 2023-01-10 NOTE — Progress Notes (Signed)
Subjective: Patient seen and examined at bedside. He reports that he is doing much better from admission. Patient appears much more active today. He denies any shortness of breath, chest pain, palpitations, headache, or changes in his vision. Patient still endorses some mild lightheadedness when he ambulates but states that it is better than before. We have discussed the plan to monitor his tolerance to the new BP medications and encourage him to continue working with [facility]. Discussed results of the renal artery [place] and will follow vascular recommendations.  Objective:  Vital signs in last 24 hours: Vitals:   01/09/23 1633 01/09/23 1920 01/09/23 2332 01/10/23 0429  BP: 126/75 115/71 (!) 149/98 (!) 164/92  Pulse:      Resp: 16 20 20 12   Temp: 98.3 F (36.8 C) 97.6 F (36.4 C) 97.6 F (36.4 C) (!) 97.4 F (36.3 C)  TempSrc: Oral Oral Oral Oral  SpO2:  100%  100%  Weight:    92.4 kg   Weight change: 0.318 kg  Intake/Output Summary (Last 24 hours) at 01/10/2023 9528 Last data filed at 01/09/2023 2200 Gross per 24 hour  Intake 120 ml  Output 1225 ml  Net -1105 ml    Physical Exam Constitutional:      General: He is not in acute distress.    Appearance: He is not ill-appearing.  HENT:     Head: Normocephalic and atraumatic.  Cardiovascular:     Rate and Rhythm: Normal rate and regular rhythm.     Pulses: Normal pulses.     Heart sounds: Normal heart sounds.  Pulmonary:     Effort: Pulmonary effort is normal.     Breath sounds: Normal breath sounds.  Abdominal:     General: Abdomen is flat. Bowel sounds are normal.  Musculoskeletal:     Right lower leg: No edema.     Left lower leg: No edema.  Skin:    General: Skin is warm.  Neurological:     Mental Status: He is alert and oriented to person, place, and time.      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 He was having chest pain last night until this morning. Denies any blurry vision, lightheadedness, nausea, vomiting, or current chest pain. Patient given 1 dose IV labetalol 10 mg and 1 dose IV labetalol 20 mg. Patient was started on Nitroglycerin gtt and did endorse a headache after starting the medication.   Patient notes that he has not taken any of his home medications for the past 1-2 weeks. He believes that his amlodipine does not let him sleep at night and that he becomes diaphoretic and dizzy after taking the medication.  VAS US Renal Artery Duplex: Renal:    Right: 1-59% stenosis of the right renal artery. Normal right         Resisitive Index. Normal size right kidney. Slightly         echogenic kidneys suggesting medical renal disease.  Left:  1-59% stenosis of the left renal artery. Normal left         Resistive Index. Normal size of left kidney. Slightly         echogenic kidneys suggesting medical renal disease.  Mesenteric:  70 to 99% stenosis in the celiac artery.   Plan: -Cardiology was consulted for patients increased heart score and troponin elevation. We are  continuing to follow their recommendations. -Nitroglycerin gtt titrated down and discontinued 10/22 after patient reached goal BP <160/110. Patient transitioned to Imdur 60. -Tylenol 650 Q6 PRN for pain -Patient received two dose IV Lasix 40. Continue Torsemide 20 -Continue amlodipine 10 -Continue carvedilol 37.5 BID -Continue hydralazine 50 BID -Will continue to monitor BP with the po medication transition -Encouraged to continue working with [facility] team -Vascular consulted for renal artery [place] results. On call physician will see patient today. Appreciate their recommendations.   Troponin Elevation Troponin 252 and 208. Not likely due to ACS. Likely due to demand ischemia. Patient had chest pain overnight and  denies any current chest pain. Initial EKG showed sinus rhythm without any signs of acute cardiac ischemia. Telemetry with ST segment depressions. Given troponemia and moderate HEART score, cardiology consulted; spoke with Dr. Flora Lipps. Will follow up EKG, telemetry changes, and troponins until flat.  Plan: -Will order repeat EKG. Repeat EKG did not show any changes. -Continuing to trend troponin. Last troponin 168. -Continuing cardiac monitoring -Complete echo ordered. EF improved to 60-65% and mild MR noted.   Community Acquired Pneumonia-Resolved Patient has been having productive cough for the last two weeks with chills and gradually increased shortness of breath. CXR showed mild pulmonary vascular congestion and left basilar opacity.Patient treated two months ago for right sided multifocal pneumonia. He was treated with a 14 day course of Augmentin. Patient states that this is third episode of pneumonia this year and denies any. COVID, RSV, and Influenza panels are negative. He remains afebrile with no leukocytosis. No crackles heard on exam. WBC is 7.4 on admission. Patient was started on IV Ceftriaxone and IV Azithromycin.  WBC was 6.8 today. Procalcitonin was normal. Plan:  -Discontinued IV Ceftriaxone and Azithromycin -Monitoring CBC     CHF with mildly reduced EF of 45-50% with recovered EF 60-65% Patient did not have any current shortness of breath. CXR showed mild pulmonary vascular congestion. No crackles or rales on exam. BNP was 647.2. Patient has not been taking his home torsemide 10 2 tablets. Patient was seen by [facility] heart failure and found to not meet criteria for their team due to established care with Dr. Gasper Lloyd.   Repeat Echo 10/23 showed: 1. Left ventricular ejection fraction, by estimation, is 60 to 65%. The  left ventricle has normal function. The left ventricle has no regional  wall motion abnormalities. There is moderate concentric left ventricular  hypertrophy.  Left ventricular  diastolic parameters are consistent with Grade II diastolic dysfunction  (pseudonormalization). The average left ventricular global longitudinal  strain is 9.1 %. GLS appears underestimated.   2. Right ventricular systolic function is normal. The right ventricular  size is normal. There is mildly elevated pulmonary artery systolic  pressure.   3. Left atrial size was moderately dilated.   4. Right atrial size was mildly dilated.   5. The mitral valve is normal in structure. Mild mitral valve  regurgitation. No evidence of mitral stenosis.   6. The aortic valve is tricuspid. There is mild calcification of the  aortic valve. Aortic valve regurgitation is not visualized. Aortic valve  sclerosis/calcification is present, without any evidence of aortic  stenosis.   7. The inferior vena cava is normal in size with greater than 50%  respiratory variability, suggesting right atrial pressure of 3 mmHg.  Plan: -Patient received one dose lasix IV 40 mg. Continue Torsemide 20. -Complete echo 10/23 results above -Continue Farxiga 10     AKI on CKD Stage  IV Cr was 4.1 and 4.5 in the ED. Baseline Cr is 3.89. Renal US performed 07/2022 was normal. UA showed moderate Hgb and proteinuria. Microscopy did not show any bacteria. -Repeat renal US ordered. Did not show hydronephrosis but showed echogenic kidneys. -Continuing to monitor Cr -Restarted home sodium bicarb 650 BID -Ordered CK. CK 365.   OSA  Patient notes episodes of apnea that wakes him up at night. He does not use a CPAP or BiPAP machine at home. He had a sleep study done in 10/2022 that showed severe sleep apnea and recommendation of auto-CPAP and possible ENT evaluation. This may be contributing to hypertension. Plan: -Continuing to monitor respiratory status -Will arrange CPAP titration before patient leaves   Depression -Started home Sertraline 50   Diet: Heart healthy VTE: Subcutaneous Heparin Code: DNR - does not  want family to know about this choice Dispo: Possible discharge tomorrow pending vascular consult   [name], MS4   LOS: 2 days   [name], Medical Student 01/10/2023, 6:49 AM

## 2023-01-11 ENCOUNTER — Other Ambulatory Visit (HOSPITAL_COMMUNITY): Payer: Self-pay

## 2023-01-11 DIAGNOSIS — K559 Vascular disorder of intestine, unspecified: Secondary | ICD-10-CM | POA: Diagnosis not present

## 2023-01-11 DIAGNOSIS — I161 Hypertensive emergency: Secondary | ICD-10-CM | POA: Diagnosis not present

## 2023-01-11 LAB — LIPID PANEL
Cholesterol: 146 mg/dL (ref 0–200)
HDL: 57 mg/dL (ref >40)
LDL Cholesterol: 80 mg/dL (ref 0–99)
Total CHOL/HDL Ratio: 2.6 {ratio}
Triglycerides: 47 mg/dL (ref <150)
VLDL: 9 mg/dL (ref 0–40)

## 2023-01-11 LAB — CBC WITH DIFFERENTIAL/PLATELET
Abs Immature Granulocytes: 0.02 10*3/uL (ref 0.00–0.07)
Basophils Absolute: 0 10*3/uL (ref 0.0–0.1)
Basophils Relative: 1 %
Eosinophils Absolute: 0.3 10*3/uL (ref 0.0–0.5)
Eosinophils Relative: 5 %
HCT: 31.1 % — ABNORMAL LOW (ref 39.0–52.0)
Hemoglobin: 10.3 g/dL — ABNORMAL LOW (ref 13.0–17.0)
Immature Granulocytes: 0 %
Lymphocytes Relative: 24 %
Lymphs Abs: 1.3 10*3/uL (ref 0.7–4.0)
MCH: 29.9 pg (ref 26.0–34.0)
MCHC: 33.1 g/dL (ref 30.0–36.0)
MCV: 90.4 fL (ref 80.0–100.0)
Monocytes Absolute: 0.4 10*3/uL (ref 0.1–1.0)
Monocytes Relative: 8 %
Neutro Abs: 3.4 10*3/uL (ref 1.7–7.7)
Neutrophils Relative %: 62 %
Platelets: 187 10*3/uL (ref 150–400)
RBC: 3.44 MIL/uL — ABNORMAL LOW (ref 4.22–5.81)
RDW: 13.4 % (ref 11.5–15.5)
WBC: 5.4 10*3/uL (ref 4.0–10.5)
nRBC: 0 % (ref 0.0–0.2)

## 2023-01-11 LAB — COMPREHENSIVE METABOLIC PANEL
ALT: 17 U/L (ref 0–44)
AST: 15 U/L (ref 15–41)
Albumin: 3.2 g/dL — ABNORMAL LOW (ref 3.5–5.0)
Alkaline Phosphatase: 74 U/L (ref 38–126)
Anion gap: 10 (ref 5–15)
BUN: 44 mg/dL — ABNORMAL HIGH (ref 6–20)
CO2: 22 mmol/L (ref 22–32)
Calcium: 7.9 mg/dL — ABNORMAL LOW (ref 8.9–10.3)
Chloride: 106 mmol/L (ref 98–111)
Creatinine, Ser: 4.3 mg/dL — ABNORMAL HIGH (ref 0.61–1.24)
GFR, Estimated: 16 mL/min — ABNORMAL LOW (ref >60)
Glucose, Bld: 104 mg/dL — ABNORMAL HIGH (ref 70–99)
Potassium: 3.8 mmol/L (ref 3.5–5.1)
Sodium: 138 mmol/L (ref 135–145)
Total Bilirubin: 0.4 mg/dL (ref 0.3–1.2)
Total Protein: 6.4 g/dL — ABNORMAL LOW (ref 6.5–8.1)

## 2023-01-11 MED ORDER — CARVEDILOL 12.5 MG PO TABS
37.5000 mg | ORAL_TABLET | Freq: Two times a day (BID) | ORAL | 0 refills | Status: DC
  Filled 2023-01-11: qty 180, 30d supply, fill #0

## 2023-01-11 MED ORDER — ASPIRIN 81 MG PO TBEC
81.0000 mg | DELAYED_RELEASE_TABLET | Freq: Every day | ORAL | Status: DC
Start: 2023-01-11 — End: 2023-01-11
  Administered 2023-01-11: 81 mg via ORAL
  Filled 2023-01-11: qty 1

## 2023-01-11 MED ORDER — ROSUVASTATIN CALCIUM 10 MG PO TABS
10.0000 mg | ORAL_TABLET | Freq: Every day | ORAL | 0 refills | Status: DC
  Filled 2023-01-11 (×2): qty 30, 30d supply, fill #0

## 2023-01-11 MED ORDER — ROSUVASTATIN CALCIUM 20 MG PO TABS
20.0000 mg | ORAL_TABLET | Freq: Every day | ORAL | Status: DC
  Administered 2023-01-11: 20 mg via ORAL
  Filled 2023-01-11: qty 1

## 2023-01-11 MED ORDER — DAPAGLIFLOZIN PROPANEDIOL 10 MG PO TABS
10.0000 mg | ORAL_TABLET | Freq: Every day | ORAL | 0 refills | Status: DC
  Filled 2023-01-11: qty 30, 30d supply, fill #0

## 2023-01-11 MED ORDER — ROSUVASTATIN CALCIUM 5 MG PO TABS: 10.0000 mg | ORAL_TABLET | Freq: Every day | ORAL | Status: DC

## 2023-01-11 MED ORDER — ASPIRIN 81 MG PO TBEC
81.0000 mg | DELAYED_RELEASE_TABLET | Freq: Every day | ORAL | 0 refills | Status: AC
  Filled 2023-01-11: qty 30, 30d supply, fill #0

## 2023-01-11 MED ORDER — TORSEMIDE 10 MG PO TABS
20.0000 mg | ORAL_TABLET | Freq: Every day | ORAL | 2 refills | Status: DC
  Filled 2023-01-11: qty 60, 30d supply, fill #0

## 2023-01-11 MED ORDER — HYDRALAZINE HCL 50 MG PO TABS
50.0000 mg | ORAL_TABLET | Freq: Two times a day (BID) | ORAL | 0 refills | Status: DC
  Filled 2023-01-11: qty 60, 30d supply, fill #0

## 2023-01-11 MED ORDER — ISOSORBIDE MONONITRATE ER 60 MG PO TB24
60.0000 mg | ORAL_TABLET | Freq: Every evening | ORAL | 0 refills | Status: DC
  Filled 2023-01-11: qty 30, 30d supply, fill #0

## 2023-01-11 MED ORDER — ROSUVASTATIN CALCIUM 10 MG PO TABS
10.0000 mg | ORAL_TABLET | Freq: Every day | ORAL | 1 refills | Status: DC
  Filled 2023-01-11 (×2): qty 30, 30d supply, fill #0

## 2023-01-11 NOTE — Discharge Summary (Signed)
Name: Mark Hayden MRN: [id number] DOB: [birth year] 47 y.o. PCP: Lovie Macadamia, MD  Date of Admission: 01/07/2023 12:49 PM Date of Discharge: 01/11/2023 Attending Physician: Ginnie Smart, MD  Discharge Diagnosis: Acute Severe Symptomatic Hypertension 2/2 to Medication Noncompliance AKI on CKD HFrEF 40-45% with recovered EF of 60-65% OSA  Discharge Medications: Allergies as of 01/11/2023       Reactions   Fish Allergy Anaphylaxis, Itching   Shellfish Allergy Anaphylaxis, Itching   seafood        Medication List     TAKE these medications    amLODipine 10 MG tablet Commonly known as: NORVASC Take 1 tablet (10 mg total) by mouth daily.   aspirin EC 81 MG tablet Take 1 tablet (81 mg total) by mouth daily. Swallow whole.   Blood Pressure Monitor Devi Use to check blood pressure daily   carvedilol 12.5 MG tablet Commonly known as: COREG Take 3 tablets (37.5 mg total) by mouth 2 (two) times daily with a meal. What changed:  medication strength how much to take when to take this   dapagliflozin propanediol 10 MG Tabs tablet Commonly known as: FARXIGA Take 1 tablet (10 mg total) by mouth daily. What changed: when to take this   hydrALAZINE 50 MG tablet Commonly known as: APRESOLINE Take 1 tablet (50 mg total) by mouth every 12 (twelve) hours. What changed:  medication strength how much to take when to take this   isosorbide mononitrate 60 MG 24 hr tablet Commonly known as: IMDUR Take 1 tablet (60 mg total) by mouth at bedtime. What changed:  how much to take when to take this   potassium chloride 10 MEQ tablet Commonly known as: KLOR-CON Take 1 tablet (10 mEq total) by mouth daily.   rosuvastatin 20 MG tablet Commonly known as: CRESTOR Take 1 tablet (20 mg total) by mouth daily. Start taking on: January 12, 2023   sertraline 50 MG tablet Commonly known as: Zoloft Take 1 tablet (50 mg total) by mouth daily.   sodium  bicarbonate 650 MG tablet Take 1 tablet (650 mg total) by mouth 2 (two) times daily.   torsemide 10 MG tablet Commonly known as: DEMADEX Take 2 tablets (20 mg total) by mouth daily.        Disposition and follow-up:   Mark Hayden was discharged from [facility] in Stable condition.  At the hospital follow up visit please address:  Severe symptomatic hypertension HFimpEF - Monitor BP and symptoms -Titrate medications as needed; monitor kidney function -Ensure follow up Cardiology -Repeat BMP at follow up  Severe celiac artery stenosis -Ensure medical adherence to statin and ASA -Ensure follow up with vascular surgery outpatient  AKI on CKD4 -Stable Cr/ GFR with adjusted HTN regimen -Monitor BMP -ensure follow up with nephrology outpatient  OSA -needs outpatient titration study    Labs / imaging needed at time of follow-up: CBC and CMP  Medication changes: Dosage change for the following medications IMDUR: 60 mg NOW - this means only one tablet at bedtime CARVEDILOL: 37.5 mg twice a day; this means take three 12.5 mg tablets every 12 hours  HYDRALAZINE: 50 mg in the morning and at night  CONTINUE: TORSEMIDE: 20MG  (2 TABLETS) once a day.  - Weigh yourself every day.  - if you weigh more than 205 pounds, take an addition 2 tablets ( for a total of 4 tablets per day)  until weight is under 205 pounds. AMLODIPINE 10 mg once a day  FARXIGA10 mg once a day SODIUM BICARBONATE 650 MG - one tablet in the AM and one in the PM Potassium Chloride 10 meq once a day  NEW MEDICATIONS  - for your narrow celiac artery  Crestor 20 mg once a day Aspirin 81 mg daily  Follow-up Appointments:  Follow-up Information     Leonie Douglas, MD. Schedule an appointment as soon as possible for a visit in 1 month(s).   Specialties: Vascular Surgery, Interventional Cardiology Contact information: [address] [place] [address] [phone]                 Internal Medicine Clinic at 01/21/2023 at 10:45 am. Cardiology on 01/22/2023 at 9:30 am and 02/06/2023 at 2:00 pm.  Hospital Course by problem list: 1. BJY:NWGNFAO is a 47 year old male with PMH of CHF (EF 45-50%), CKD Stage IV, HTN, depression and OSA who presented with shortness of breath and productive cough. He initially went to urgent care and was found to have hypertensive emergency with BP of 230/137. CXR showed LLL opacities. Patient stated that two weeks ago he began having productive cough with one episode of blood in the sputum, shortness of breath that gradually increased, and chills. Last night he began having chest pain and trouble sleeping.  Chest pain resolved this morning.He reports having apneic episodes at night and had a sleep study completed two months ago where he was found to have OSA. He does not use a CPAP or BiPAP machine at home. He was hospitalized two months ago for multifocal pneumonia and treated with Augmentin 14 days. Patient states that he has not taken any of his home medications for 1-2 weeks and believes that his amlodipine makes him dizzy, diaphoretic, and does not allow him to sleep at night. Denies any current chest pain, abdominal pain, headache, shortness of breath or blurry vision. Endorses chronic nausea and productive cough.   Last echo was in 08/2022 with Left ventricular ejection fraction, by estimation, is 45 to 50%. The  left ventricle has mildly decreased function. The left ventricle  demonstrates global hypokinesis. There is severe left ventricular  hypertrophy.   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 Patient reported medication nonadherence for ~2 weeks as he felt persistently lightheaded at work. Initially treated with high doses of IV nitroglycerine and oral antihypertensives.  BNP was 647.2. Cardiology was consulted for troponin elevation and cardiac telemetry changes; TTE on 10/23 on howed EF improvement to 60-65% with mild MR. Patient without chest pain during this admission. HOD 2, he was transitioned to oral antihypertensive therapy as his renal function allowed. Initially with orthostatic hypotensive symptoms on TID dosing of hydralazine. See medication changes above; patient instructions provided. He is also to continue on Torsemide 20 mg daily.  Cardiology has arranged outpatient follow up. Please monitor in clinic for signs of symptomatic hypotension as this patient has baseline SBP of 150. Monitor renal function and electrolytes as needed.  AKI on CKD Stage IV Cr was 4.1 and 4.5 in the ED. Baseline Cr is 3.89. Renal US performed 07/2022 was normal. UA showed moderate Hgb and proteinuria. Microscopy did not show any bacteria. Renal US with echogenic kidneys and  without hydronephrosis. Renal vascular ultrasound with doppler negative for renal artery stenosis. Cr stable with the adjustment of antihypertensives and with improvement in blood pressure. Will need close monitoring of his renal function at follow  up. Will need follow up with [facility] with advanced kidney disease. Note severe celiac artery stenosis below, likely contributing.   Celiac artery stenosis Renal doppler US with 59-77% stenosis of the celiac artery. Vascular evaluated the patient and did not recommend any interventions at this time. They will follow patient OP and recommend medical therapy for atherosclerosis. Patients LDL during this admission of 80, above goal for secondary hypertension. Mark Hayden was started on Crestor 20 mg and ASA 81 mg. Repeat lipid panel in 6-8 weeks.   OSA  Patient notes  episodes of apnea that wakes him up at night. He does not use a CPAP or BiPAP machine at home. He had a sleep study done in 10/2022 that showed severe sleep apnea and recommendation of auto-CPAP and possible ENT evaluation. This may be contributing to hypertension. Patient will have CPAP titration OP by Cardiology  Community Acquired Pneumonia Patient has been having productive cough for the last two weeks with chills and gradually increased shortness of breath. CXR showed mild pulmonary vascular congestion and left basilar opacity.Patient treated two months ago for right sided multifocal pneumonia. He was treated with a 14 day course of Augmentin. Patient states that this is third episode of pneumonia this year and denies any. COVID, RSV, and Influenza panels are negative. He remains afebrile with no leukocytosis. No crackles heard on exam. WBC is 7.4 on admission. Patient was started on IV Ceftriaxone and IV Azithromycin. Procalcitonin <10 and with improvement  in respiratory status, antibiotic therapy was discontinued.   Depression -Started home Sertraline 50  Discharge subjective - Feeling much better than at presentation. Has tolerated PO intake and ambulation without orthostatic symptoms. No chest pain, abdominal pain, nausea or vomiting. Aware of his disease process. Discussed importance of medication adherence and communication with primary care and specialists.  Discharge Exam:   BP (!) 145/86 (BP Location: Left Arm)   Pulse 72   Temp 98.2 F (36.8 C) (Oral)   Resp 17   Wt 93 kg   SpO2 99%   BMI 28.61 kg/m  Discharge exam: Physical Exam Constitutional:      General: He is not in acute distress.    Appearance: He is not ill-appearing.  Cardiovascular:     Rate and Rhythm: Normal rate and regular rhythm.     Pulses: Normal pulses.     Heart sounds: Normal heart sounds.  Pulmonary:     Effort: Pulmonary effort is normal.     Breath sounds: Normal breath sounds.  Abdominal:      General: Abdomen is flat. Bowel sounds are normal.     Palpations: Abdomen is soft.  Musculoskeletal:     Right lower leg: No edema.     Left lower leg: No edema.  Skin:    General: Skin is warm.  Neurological:     Mental Status: He is alert and oriented to person, place, and time.  Psychiatric:        Mood and Affect: Mood normal.        Behavior: Behavior normal.      Pertinent Labs, Studies, and Procedures:     Latest Ref Rng & Units 01/11/2023    4:21 AM 01/10/2023    4:28 AM 01/09/2023    3:23 AM  CBC  WBC 4.0 - 10.5 K/uL 5.4  5.9  7.0   Hemoglobin 13.0 - 17.0 g/dL 86.5  78.4  69.6   Hematocrit 39.0 - 52.0 % 31.1  30.4  32.6   Platelets 150 - 400 K/uL 187  176  205        Latest Ref Rng & Units 01/11/2023    4:21 AM 01/10/2023    4:28 AM 01/09/2023    3:23 AM  CMP  Glucose 70 - 99 mg/dL 295  99  284   BUN 6 - 20 mg/dL 44  40  40   Creatinine 0.61 - 1.24 mg/dL 1.32  4.40  1.02   Sodium 135 - 145 mmol/L 138  136  136   Potassium 3.5 - 5.1 mmol/L 3.8  3.6  3.3   Chloride 98 - 111 mmol/L 106  108  105   CO2 22 - 32 mmol/L 22  20  20    Calcium 8.9 - 10.3 mg/dL 7.9  8.1  8.3   Total Protein 6.5 - 8.1 g/dL 6.4  6.6  7.1   Total Bilirubin 0.3 - 1.2 mg/dL 0.4  0.5  0.6   Alkaline Phos 38 - 126 U/L 74  78  80   AST 15 - 41 U/L 15  16  16    ALT 0 - 44 U/L 17  17  18      Discharge Instructions: Discharge Instructions     Diet - low sodium heart healthy   Complete by: As directed    Discharge instructions   Complete by: As directed    Hello Mark Hayden, you were admitted for severe symptomatic hypertension, high blood pressure. This may have been due to stopping your previous medications and not having a CPAP machine at home for your obstructive sleep apnea. We treated you with both IV medication and oral medications to control your blood pressure. We have switched you to all oral medications and wanted to monitor to you to make sure you were tolerating  those.  MEDICATIONS:  We understand this can be a lot of medicines, but it is important that you continue to take them so you do not get sick again.  Dosage change for the following medications IMDUR: 60 mg NOW - this means only one tablet at bedtime CARVEDILOL: 37.5 mg twice a day; this means take three 12.5 mg tablets every 12 hours  HYDRALAZINE: 50 mg in the morning and at night  CONTINUE: TORSEMIDE: 20MG  (2 TABLETS) once a day.  - Weigh yourself every day.  - if you weigh more than 205 pounds, take an addition 2 tablets ( for a total of 4 tablets per day)  until weight is under 205 pounds. AMLODIPINE 10 mg once a day FARXIGA10 mg once a day SODIUM BICARBONATE 650 MG - one tablet in the AM and one in the PM Potassium Chloride 10 meq once a day  NEW MEDICATIONS  - for your narrow celiac artery  Crestor 20 mg once a day Aspirin 81 mg daily  Please keep the following appointments:  Internal Medicine Clinic 01/21/2023 at 10:45 am Cardiology on 01/22/2023 at 9:30 am and 02/06/2023 at 2:00 pm We have attached the name and the number for the vascular surgeon (Dr. Lenell Antu); please call them to make a follow up appointment: [phone]  If you are having any of the following symptoms, please seek medical attention: chest pain, shortness of breath, severe headache or sudden changes in your vision. These can  be symptoms that you get when your blood pressure is too high.  It was a pleasure taking care of you!  [name], MS4 [name], MD Internal Medicine [place]   Increase activity slowly   Complete by: As directed         Signed: [name], MS4 and [name], MD 01/11/2023, 10:11 AM   Pager: [phone]

## 2023-01-11 NOTE — Progress Notes (Addendum)
Mobility Specialist Progress Note:   01/11/23 1212  Mobility  Activity Ambulated independently in hallway  Level of Assistance Independent after set-up  Assistive Device None  Distance Ambulated (ft) 400 ft  Activity Response Tolerated well  Mobility Referral Yes  $Mobility charge 1 Mobility  Mobility Specialist Start Time (ACUTE ONLY) 1200  Mobility Specialist Stop Time (ACUTE ONLY) 1210  Mobility Specialist Time Calculation (min) (ACUTE ONLY) 10 min   Pre Mobility: 77 HR  During Mobility: 81 HR  Post Mobility: 76 HR   Pt received in bed, agreeable to mobility. Denied any discomfort during ambulation, asymptomatic throughout. Pt returned to bed with call bell in reach and all needs met.  Leory Plowman  Mobility Specialist Please contact via [employer] office at [phone]

## 2023-01-11 NOTE — Progress Notes (Addendum)
Updated patient instructions to reflect change in Crestor to 10 mg daily, adjusted for CrCl  Hello Mark Hayden, you were admitted for severe symptomatic hypertension, high blood pressure. This may have been due to stopping your previous medications and not having a CPAP machine at home for your obstructive sleep apnea. We treated you with both IV medication and oral medications to control your blood pressure. We have switched you to all oral medications and wanted to monitor to you to make sure you were tolerating those.   MEDICATIONS:  We understand this can be a lot of medicines, but it is important that you continue to take them so you do not get sick again.   Dosage change for the following medications IMDUR: 60 mg NOW - this means only one tablet at bedtime CARVEDILOL: 37.5 mg twice a day; this means take three 12.5 mg tablets every 12 hours  HYDRALAZINE: 50 mg in the morning and at night   CONTINUE: TORSEMIDE: 20MG  (2 TABLETS) once a day.  - Weigh yourself every day.  - if you weigh more than 205 pounds, take an addition 2 tablets ( for a total of 4 tablets per day)  until weight is under 205 pounds. AMLODIPINE 10 mg once a day FARXIGA10 mg once a day SODIUM BICARBONATE 650 MG - one tablet in the AM and one in the PM Potassium Chloride 10 meq once a day   NEW MEDICATIONS  For your narrow celiac artery  Crestor 10 mg once a day Aspirin 81 mg daily   Please keep the following appointments:   Internal Medicine Clinic 01/21/2023 at 10:45 am Cardiology on 01/22/2023 at 9:30 am and 02/06/2023 at 2:00 pm We have attached the name and the number for the vascular surgeon (Dr. Lenell Antu); please call them to make a follow up appointment: [phone]   If you are having any of the following symptoms, please seek medical attention: chest pain, shortness of breath, severe headache or sudden changes in your vision. These can be symptoms that you get when your blood pressure is too high.    It was a pleasure taking care of you!   [name], MS4 [name], MD Internal Medicine [facility]

## 2023-01-11 NOTE — Progress Notes (Signed)
MD is okay with pt. driving self home today after discharge.   Everlean Cherry, RN .

## 2023-01-11 NOTE — TOC Initial Note (Signed)
Transition of Care [facility]) - Initial/Assessment Note    Patient Details  Name: Mark Hayden MRN: [id number] Date of Birth: [birth year]  Transition of Care [facility]) CM/SW Contact:    Leone Haven, RN Phone Number: 01/11/2023, 11:28 AM  Clinical Narrative:                 From home alone, has PCP and insurance on file, states has no HH services in place at this time or DME at home.  States he will drive himself home at dc. He has no support system, states gets medications from [facility] on [address].  Pta self ambulatory.   Expected Discharge Plan: Home/Self Care Barriers to Discharge: No Barriers Identified   Patient Goals and CMS Choice Patient states their goals for this hospitalization and ongoing recovery are:: return home   Choice offered to / list presented to : NA      Expected Discharge Plan and Services In-house Referral: NA Discharge Planning Services: CM Consult Post Acute Care Choice: NA Living arrangements for the past 2 months: Single Family Home Expected Discharge Date: 01/11/23               DME Arranged: N/A         HH Arranged: NA          Prior Living Arrangements/Services Living arrangements for the past 2 months: Single Family Home Lives with:: Self Patient language and need for interpreter reviewed:: Yes Do you feel safe going back to the place where you live?: Yes      Need for Family Participation in Patient Care: No (Comment) Care giver support system in place?: No (comment)   Criminal Activity/Legal Involvement Pertinent to Current Situation/Hospitalization: No - Comment as needed  Activities of Daily Living   ADL Screening (condition at time of admission) Independently performs ADLs?: Yes (appropriate for developmental age) Is the patient deaf or have difficulty hearing?: No Does the patient have difficulty seeing, even when wearing glasses/contacts?: No Does the patient have difficulty concentrating, remembering, or  making decisions?: Yes  Permission Sought/Granted Permission sought to share information with : Case Manager Permission granted to share information with : Yes, Verbal Permission Granted              Emotional Assessment   Attitude/Demeanor/Rapport: Engaged Affect (typically observed): Appropriate Orientation: : Oriented to Self, Oriented to Place, Oriented to  Time, Oriented to Situation   Psych Involvement: No (comment)  Admission diagnosis:  Hypertensive urgency [I16.0] Hypertensive emergency [I16.1] Pneumonia of left lower lobe due to infectious organism [J18.9] Patient Active Problem List   Diagnosis Date Noted   Ischemia of large intestine (HCC) 01/11/2023   Hypertensive urgency 01/08/2023   Hypertensive emergency 01/07/2023   OSA (obstructive sleep apnea) 11/15/2022   Depression 11/15/2022   Heart failure with mildly reduced ejection fraction (HFmrEF) (HCC) 11/03/2022   LVH (left ventricular hypertrophy) 11/03/2022   Pneumonia of left lower lobe due to infectious organism 10/28/2022   Hypertension 09/10/2022   Other restrictive cardiomyopathy (HCC) 09/05/2022   Iron deficiency anemia 08/20/2022   CKD (chronic kidney disease) stage 4, GFR 15-29 ml/min (HCC) 08/17/2022   AKI (acute kidney injury) (HCC)    PCP:  Lovie Macadamia, MD Pharmacy:   [facility] [address], [place] - [address] [place] [address] Phone: [phone] Fax: [phone]     Social Determinants of Health (SDOH) Social History: SDOH Screenings   Food Insecurity: No Food Insecurity (01/07/2023)  Housing:  Low Risk  (01/07/2023)  Transportation Needs: No Transportation Needs (01/07/2023)  Utilities: Not At Risk (01/07/2023)  Alcohol Screen: Low Risk  (08/20/2022)  Depression (PHQ2-9): High Risk (11/15/2022)  Financial Resource Strain: High Risk (09/12/2022)  Tobacco Use: Medium Risk (01/07/2023)   SDOH Interventions:     Readmission  Risk Interventions     No data to display

## 2023-01-11 NOTE — Progress Notes (Signed)
Discharge instructions reviewed with pt, verbalized understanding and able to teach back.  Copy of instructions given to pt. [facility] has filled pt's scripts and will be picked up on the way out for discharge. Pt drove himself to the hospital and MD is okay with pt driving self home.  Pt to be d/c'd via wheelchair with belongings once he is dressed and ready to go.              To be escorted by hospital volunteer/staff.   Chadric Kimberley,RN SWOT

## 2023-01-11 NOTE — Discharge Instructions (Addendum)
Hello Mark Hayden, you were admitted for severe symptomatic hypertension, high blood pressure. This may have been due to stopping your previous medications and not having a CPAP machine at home for your obstructive sleep apnea. We treated you with both IV medication and oral medications to control your blood pressure. We have switched you to all oral medications and wanted to monitor to you to make sure you were tolerating those.   MEDICATIONS:  We understand this can be a lot of medicines, but it is important that you continue to take them so you do not get sick again.   Dosage change for the following medications IMDUR: 60 mg NOW - this means only one tablet at bedtime CARVEDILOL: 37.5 mg twice a day; this means take three 12.5 mg tablets every 12 hours  HYDRALAZINE: 50 mg in the morning and at night   CONTINUE: TORSEMIDE: 20MG  (2 TABLETS) once a day.  - Weigh yourself every day.  - if you weigh more than 205 pounds, take an addition 2 tablets ( for a total of 4 tablets per day)  until weight is under 205 pounds. AMLODIPINE 10 mg once a day FARXIGA10 mg once a day SODIUM BICARBONATE 650 MG - one tablet in the AM and one in the PM Potassium Chloride 10 meq once a day   NEW MEDICATIONS  - for your narrow celiac artery  Crestor 10 mg once a day Aspirin 81 mg daily   Please keep the following appointments:   Internal Medicine Clinic 01/21/2023 at 10:45 am Cardiology on 01/22/2023 at 9:30 am and 02/06/2023 at 2:00 pm We have attached the name and the number for the vascular surgeon (Dr. Lenell Antu); please call them to make a follow up appointment: [phone]   If you are having any of the following symptoms, please seek medical attention: chest pain, shortness of breath, severe headache or sudden changes in your vision. These can be symptoms that you get when your blood pressure is too high.   It was a pleasure taking care of you!   [name], MS4 [name],  MD Internal Medicine [facility]

## 2023-01-11 NOTE — TOC Transition Note (Signed)
Transition of Care [facility]) - CM/SW Discharge Note   Patient Details  Name: Mark Hayden MRN: [id number] Date of Birth: [birth year]  Transition of [facility] CM/SW Contact:  Leone Haven, RN Phone Number: 01/11/2023, 11:29 AM   Clinical Narrative:    For dc today, he will drive him self home, TOC to fill meds. He has no other needs.   Final next level of care: Home/Self Care Barriers to Discharge: No Barriers Identified   Patient Goals and CMS Choice   Choice offered to / list presented to : NA  Discharge Placement                         Discharge Plan and Services Additional resources added to the After Visit Summary for   In-house Referral: NA Discharge Planning Services: CM Consult Post Acute Care Choice: NA          DME Arranged: N/A         HH Arranged: NA          Social Determinants of Health (SDOH) Interventions SDOH Screenings   Food Insecurity: No Food Insecurity (01/07/2023)  Housing: Low Risk  (01/07/2023)  Transportation Needs: No Transportation Needs (01/07/2023)  Utilities: Not At Risk (01/07/2023)  Alcohol Screen: Low Risk  (08/20/2022)  Depression (PHQ2-9): High Risk (11/15/2022)  Financial Resource Strain: High Risk (09/12/2022)  Tobacco Use: Medium Risk (01/07/2023)     Readmission Risk Interventions     No data to display

## 2023-01-14 ENCOUNTER — Telehealth: Payer: Self-pay

## 2023-01-14 NOTE — Transitions of Care (Post Inpatient/ED Visit) (Signed)
01/14/2023  Name: Muzammil Hettich MRN: [id number] DOB: 05-23-1975  Today's TOC FU Call Status: Today's TOC FU Call Status:: Unsuccessful Call (1st Attempt) Unsuccessful Call (1st Attempt) Date: 01/14/23  Attempted to reach the patient regarding the most recent Inpatient/ED visit.  Follow Up Plan: Additional outreach attempts will be made to reach the patient to complete the Transitions of Care (Post Inpatient/ED visit) call.   Jodelle Gross RN, BSN, CCM RN Care Manager  Transitions of Care  [facility]  [phone]

## 2023-01-14 NOTE — BH Specialist Note (Signed)
Patient no-showed today's appointment ( 10/01); appointment was for Telephone visit at 2:30Pm  Behavioral Health Clinician [facility] from here on out)  attempted patient twice via Telephone .  [facility] contacted patient from telephone number [phone]. [facility] left a  VM.   Patient will need to reschedule appointment by calling Internal medicine center [phone].  Christen Butter, MSW, LCSW-A She/Her Behavioral Health Clinician [facility]  Internal Medicine Center Direct Dial:[phone]  Fax [phone] Main Office Phone: [phone] [address] [place] [place], [place] [address] Website: [facility] Internal Medicine [facility]  [place], [place]  [employer]

## 2023-01-14 NOTE — Telephone Encounter (Addendum)
Informed patient of titration results and verbalized understanding was indicated. PER DR TURNER, If unable to perform an in lab titration then initiate ResMed auto CPAP from 4 to 15cm H2O with heated humidity and mask of choice and overnight pulse ox on CPAP.   Upon patient request DME selection is [facility] Patient understands he will be contacted by [facility] to set up his cpap. Patient understands to call if [facility] does not contact him with new setup in a timely manner. Patient understands they will be called once confirmation has been received from [facility] that they have received their new machine to schedule 10 week follow up appointment.   [facility] notified of new cpap order  Please add to airview Patient was grateful for the call and thanked me.

## 2023-01-14 NOTE — Telephone Encounter (Signed)
**Note De-Identified Ravneet Spilker Obfuscation** Letter received from [employer] Wi[employer] fax stating that the denied the pts CPAP Titration due to it not being not medically necessary. Per the letter they recommend that he have APAP for at least 30 days. Reference Code: [id number]

## 2023-01-18 DIAGNOSIS — Z419 Encounter for procedure for purposes other than remedying health state, unspecified: Secondary | ICD-10-CM | POA: Diagnosis not present

## 2023-01-21 ENCOUNTER — Ambulatory Visit: Payer: Managed Care, Other (non HMO) | Admitting: Internal Medicine

## 2023-01-21 ENCOUNTER — Other Ambulatory Visit: Payer: Self-pay

## 2023-01-21 ENCOUNTER — Encounter: Payer: Self-pay | Admitting: Student

## 2023-01-21 VITALS — BP 162/102 | HR 68 | Temp 97.5°F | Ht 71.0 in | Wt 205.6 lb

## 2023-01-21 DIAGNOSIS — N184 Chronic kidney disease, stage 4 (severe): Secondary | ICD-10-CM | POA: Diagnosis not present

## 2023-01-21 DIAGNOSIS — I130 Hypertensive heart and chronic kidney disease with heart failure and stage 1 through stage 4 chronic kidney disease, or unspecified chronic kidney disease: Secondary | ICD-10-CM

## 2023-01-21 DIAGNOSIS — F32A Depression, unspecified: Secondary | ICD-10-CM

## 2023-01-21 DIAGNOSIS — I774 Celiac artery compression syndrome: Secondary | ICD-10-CM

## 2023-01-21 DIAGNOSIS — I5022 Chronic systolic (congestive) heart failure: Secondary | ICD-10-CM | POA: Diagnosis not present

## 2023-01-21 DIAGNOSIS — I10 Essential (primary) hypertension: Secondary | ICD-10-CM

## 2023-01-21 MED ORDER — SODIUM BICARBONATE 650 MG PO TABS: 650.0000 mg | ORAL_TABLET | Freq: Two times a day (BID) | ORAL | 1 refills | Status: DC

## 2023-01-21 NOTE — Patient Instructions (Addendum)
Thank you, Mark Hayden for allowing [place] to provide your care today.   Depression I have referred you to [facility]. Continue taking zoloft.  Sleep Apnea Please try to get in contact with company to get CPAP.  Please follow-up in 2 weeks and bring in all your medications  I have ordered the following labs for you:  Lab Orders         BMP8+Anion Gap      Referrals ordered today:   Referral Orders         Ambulatory referral to Integrated Behavioral Health      I have ordered the following medication/changed the following medications:   Stop the following medications: Medications Discontinued During This Encounter  Medication Reason   sodium bicarbonate 650 MG tablet Reorder     Start the following medications: Meds ordered this encounter  Medications   sodium bicarbonate 650 MG tablet    Sig: Take 1 tablet (650 mg total) by mouth 2 (two) times daily.    Dispense:  60 tablet    Refill:  1     Follow up:  2 weeks    We look forward to seeing you next time. Please call our clinic at [phone] if you have any questions or concerns. The best time to call is Monday-Friday from 9am-4pm, but there is someone available 24/7. If after hours or the weekend, call the main hospital number and ask for the Internal Medicine Resident On-Call. If you need medication refills, please notify your pharmacy one week in advance and they will send [place] a request.   Thank you for trusting me with your care. Wishing you the best!   Mark Christians, DO [facility]

## 2023-01-21 NOTE — Progress Notes (Unsigned)
Subjective:  CC: HFU  HPI:  [name] is a 47 y.o. male with a past medical history stated below and presents today for hospital follow-up. He was admitted from 10/22 to 10/25 with severe symptomatic hypertension 2.2 to medication nonadherence. He was also found to have worsening renal function.   Please see problem based assessment and plan for additional details.  Past Medical History:  Diagnosis Date   CHF (congestive heart failure) (HCC)    Hypertension    Hypertensive emergency 08/20/2022   Pneumonia    Sleep apnea    Suicidal thoughts 09/08/2019    Current Outpatient Medications on File Prior to Visit  Medication Sig Dispense Refill   amLODipine (NORVASC) 10 MG tablet Take 1 tablet (10 mg total) by mouth daily. 180 tablet 3   aspirin EC 81 MG tablet Take 1 tablet (81 mg total) by mouth daily. Swallow whole. 30 tablet 0   Blood Pressure Monitor DEVI Use to check blood pressure daily 1 each 0   carvedilol (COREG) 12.5 MG tablet Take 3 tablets (37.5 mg total) by mouth 2 (two) times daily with a meal. 180 tablet 0   dapagliflozin propanediol (FARXIGA) 10 MG TABS tablet Take 1 tablet (10 mg total) by mouth daily. 30 tablet 0   hydrALAZINE (APRESOLINE) 50 MG tablet Take 1 tablet (50 mg total) by mouth every 12 (twelve) hours. 60 tablet 0   isosorbide mononitrate (IMDUR) 60 MG 24 hr tablet Take 1 tablet (60 mg total) by mouth at bedtime. 30 tablet 0   potassium chloride (KLOR-CON) 10 MEQ tablet Take 1 tablet (10 mEq total) by mouth daily. 90 tablet 3   rosuvastatin (CRESTOR) 10 MG tablet Take 1 tablet (10 mg total) by mouth daily. 30 tablet 1   sertraline (ZOLOFT) 50 MG tablet Take 1 tablet (50 mg total) by mouth daily. 30 tablet 2   torsemide (DEMADEX) 10 MG tablet Take 2 tablets (20 mg total) by mouth daily. 60 tablet 2   No current facility-administered medications on file prior to visit.    Family History  Problem Relation Age of Onset   Diabetes Mother     Diabetes Father     Social History   Socioeconomic History   Marital status: Single    Spouse name: Not on file   Number of children: 1   Years of education: Not on file   Highest education level: High school graduate  Occupational History   Occupation: [employer]  Tobacco Use   Smoking status: Former   Smokeless tobacco: Never  Advertising account planner   Vaping status: Never Used  Substance and Sexual Activity   Alcohol use: No   Drug use: Yes    Types: Marijuana   Sexual activity: Not on file  Other Topics Concern   Not on file  Social History Narrative   Not on file   Social Determinants of Health   Financial Resource Strain: High Risk (09/12/2022)   Overall Financial Resource Strain (CARDIA)    Difficulty of Paying Living Expenses: Very hard  Food Insecurity: No Food Insecurity (01/07/2023)   Hunger Vital Sign    Worried About Running Out of Food in the Last Year: Never true    Ran Out of Food in the Last Year: Never true  Transportation Needs: No Transportation Needs (01/07/2023)   PRAPARE - Administrator, Civil Service (Medical): No    Lack of Transportation (Non-Medical): No  Physical Activity: Not on file  Stress: Not on file  Social Connections: Not on file  Intimate Partner Violence: Not At Risk (01/07/2023)   Humiliation, Afraid, Rape, and Kick questionnaire    Fear of Current or Ex-Partner: No    Emotionally Abused: No    Physically Abused: No    Sexually Abused: No    Review of Systems: ROS negative except for what is noted on the assessment and plan.  Objective:   Vitals:   01/21/23 1125 01/21/23 1231  BP: (!) 162/102 (!) 162/102  Pulse: 69 68  Temp: (!) 97.5 F (36.4 C)   TempSrc: Oral   SpO2: 100%   Weight: 205 lb 9.6 oz (93.3 kg)   Height: 5\' 11"  (1.803 m)     Physical Exam: Constitutional: disheveled, makes intermittent eye contact Cardiovascular: regular rate and rhythm, no m/r/g Pulmonary/Chest: normal work of breathing on  room air, lungs clear to auscultation bilaterally MSK: no lower extremity edema appreciated bilaterally Neurological: alert & oriented x 3, normal gait Skin: warm and dry Psych: apathetic     01/21/2023   11:34 AM 11/15/2022   10:56 AM 11/02/2022   10:34 AM  Depression screen PHQ 2/9  Decreased Interest 2 2 1   Down, Depressed, Hopeless 1 2 3   PHQ - 2 Score 3 4 4   Altered sleeping 3 3 3   Tired, decreased energy 3 3 3   Change in appetite 2 2 2   Feeling bad or failure about yourself  2 2 2   Trouble concentrating 2 2 3   Moving slowly or fidgety/restless 1 0 3  Suicidal thoughts 3 1 0  PHQ-9 Score 19 17 20   Difficult doing work/chores Very difficult Somewhat difficult Not difficult at all       01/21/2023   11:33 AM 11/15/2022   10:56 AM  GAD 7 : Generalized Anxiety Score  Nervous, Anxious, on Edge 2 2  Control/stop worrying 1 1  Worry too much - different things 3 2  Trouble relaxing 2 2  Restless 2 1  Easily annoyed or irritable 2 3  Afraid - awful might happen 1 0  Total GAD 7 Score 13 11  Anxiety Difficulty Extremely difficult Somewhat difficult   Assessment & Plan:  Depression PHQ9 elevated at 19. He reports that he feels like he would be better off dead but has no active thoughts of suicide. He states that, "he is not brave enough for that." GAD elevated at 13. He denies drinking alcohol or other substance use. He does not feel that he has good social support. He was started on sertraline 50 mg in August. It has helped a bit but only so he can "put his happy face on for work". He was referred to Christen Butter but I do not see that he has talked with her. He does not think sertraline dose needs to be increased at this time. A: Depression is major barrier for care for other chronic medical conditions.  P: Continue Sertraline 50 mg Referral placed to bianca ashe, he is open to talking with her He does not seem to have many protective factors Follow-up in 2-4 weeks  Heart  failure with mildly reduced ejection fraction (HFmrEF) (HCC) Home medications include coreg 37.5 mg BID, dapagliflozin 10 mg, Imdur 60 mg every day, torsemide 20 mg every day. He has been weighing himself daily and weight remains stable. Denies SOB or PND. He has not been able to get CPAP machine as he missed some calls from the company. He has follow-up with  heart failure team 11/05 and is aware of appointment. A: echo with severe LVH, consistent with long standing uncontrolled HTN. cMRI scheduled by HR clinic to rule out infiltrative disease in this young patient. I do not see that a cardiac cath has been completed. P: Continue coreg 37.5 mg BID, dapagliflozin 10 mg, imdur 60 mg, torsemide 20 mg every day  CKD (chronic kidney disease) stage 4, GFR 15-29 ml/min (HCC) Creatinine and GFR consistent with stage 4 CKD. Patient has been referred to nephrology at several visits. I see that he saw nephrology during admission in June but unable to see any note from them and pateitn was not clear if he has seen them as an outpatient. He is frustrated at being chronically ill and wishes that he could stop taking medications. A/P: BMP with creatinine from 4.3 to 4.5 with BUN increasing from 44 to 61, Bicarb of 16. He was not able to talk about nephrology appointment at visit due to becoming overwhelmed and upset. Sodium bicarb refilled Unable to reach patient to review result  Hypertension Blood pressure elevated at 162/102 x2. He did not take his morning blood pressure medications as he woke up late on the couch so was late for appointment. Medications include amlodipine 10 mg, coreg 37.5 mg BID, hydralazine 50 mg BID, torsemide 10 mg, imdur 60 mg, and torsemide 10 mg. P: Continue medications Cpap should help with this as well  Celiac artery stenosis (HCC) Unsure of vascular surgery appointment. Patient reports that he remembers calling and talking to office but he cannot remember the outcome of that phone  call. I do not see an appointment in the system currently. Vascular surgery saw him in hospital and notes mention medical management as plan. I would prioritize patient getting to nephrology appointment. No post-prandial pain. -Repeat LDL in 4 weeks   Patient discussed with Dr. Wille Celeste Danicia Terhaar, D.O. [facility] Internal Medicine  PGY-3 Pager: [phone]  Phone: [phone] Date 01/23/2023  Time 3:37 PM

## 2023-01-22 ENCOUNTER — Telehealth (HOSPITAL_COMMUNITY): Payer: Self-pay

## 2023-01-22 ENCOUNTER — Encounter (HOSPITAL_COMMUNITY): Payer: Managed Care, Other (non HMO)

## 2023-01-22 LAB — BMP8+ANION GAP
Anion Gap: 16 mmol/L (ref 10.0–18.0)
BUN/Creatinine Ratio: 13 (ref 9–20)
BUN: 61 mg/dL — ABNORMAL HIGH (ref 6–24)
CO2: 16 mmol/L — ABNORMAL LOW (ref 20–29)
Calcium: 7.5 mg/dL — ABNORMAL LOW (ref 8.7–10.2)
Chloride: 109 mmol/L — ABNORMAL HIGH (ref 96–106)
Creatinine, Ser: 4.58 mg/dL — ABNORMAL HIGH (ref 0.76–1.27)
Glucose: 86 mg/dL (ref 70–99)
Potassium: 4.6 mmol/L (ref 3.5–5.2)
Sodium: 141 mmol/L (ref 134–144)
eGFR: 15 mL/min/{1.73_m2} — ABNORMAL LOW (ref >59)

## 2023-01-22 NOTE — Assessment & Plan Note (Signed)
Home medications include coreg 37.5 mg BID, dapagliflozin 10 mg, Imdur 60 mg every day, torsemide 20 mg every day. He has been weighing himself daily and weight remains stable. Denies SOB or PND. He has not been able to get CPAP machine as he missed some calls from the company. He has follow-up with heart failure team 11/05 and is aware of appointment. A: echo with severe LVH, consistent with long standing uncontrolled HTN. cMRI scheduled by HR clinic to rule out infiltrative disease in this young patient. I do not see that a cardiac cath has been completed. P: Continue coreg 37.5 mg BID, dapagliflozin 10 mg, imdur 60 mg, torsemide 20 mg every day  Severe celiac artery stenosis Unsure of vascular surgery appointment. Patient reports that he remembers calling and talking to office but he cannot remember the outcome of that phone call. I do not see an appointment in the system currently. Vascular surgery saw him in hospital and notes mention medical management as plan. I would prioritize patient getting to nephrology appointment. No post-prandial pain. -Repeat LDL in 4 weeks

## 2023-01-22 NOTE — Assessment & Plan Note (Addendum)
PHQ9 elevated at 19. He reports that he feels like he would be better off dead but has no active thoughts of suicide. He states that, "he is not brave enough for that." GAD elevated at 13. He denies drinking alcohol or other substance use. He does not feel that he has good social support. He was started on sertraline 50 mg in August. It has helped a bit but only so he can "put his happy face on for work". He was referred to Mark Hayden but I do not see that he has talked with her. He does not think sertraline dose needs to be increased at this time. A: Depression is major barrier for care for other chronic medical conditions.  P: Continue Sertraline 50 mg Referral placed to bianca ashe, he is open to talking with her He does not seem to have many protective factors Follow-up in 2-4 weeks

## 2023-01-22 NOTE — Assessment & Plan Note (Addendum)
Creatinine and GFR consistent with stage 4 CKD. Patient has been referred to nephrology at several visits. I see that he saw nephrology during admission in June but unable to see any note from them and pateitn was not clear if he has seen them as an outpatient. He is frustrated at being chronically ill and wishes that he could stop taking medications. A/P: BMP with creatinine from 4.3 to 4.5 with BUN increasing from 44 to 61, Bicarb of 16. He was not able to talk about nephrology appointment at visit due to becoming overwhelmed and upset. Sodium bicarb refilled Unable to reach patient to review result

## 2023-01-22 NOTE — Assessment & Plan Note (Addendum)
Blood pressure elevated at 162/102 x2. He did not take his morning blood pressure medications as he woke up late on the couch so was late for appointment. Medications include amlodipine 10 mg, coreg 37.5 mg BID, hydralazine 50 mg BID, torsemide 10 mg, imdur 60 mg, and torsemide 10 mg. P: Continue medications Cpap should help with this as well

## 2023-01-22 NOTE — Telephone Encounter (Signed)
Called to confirm/remind patient of their appointment at the [facility] on 01/23/23.   Patient reminded to bring all medications and/or complete list.  Confirmed patient has transportation. Gave directions, instructed to utilize valet parking.  Confirmed appointment prior to ending call.

## 2023-01-22 NOTE — Telephone Encounter (Signed)
Called and was unable to confirm/remind patient of their appointment at the [facility] on 01/22/23.

## 2023-01-23 ENCOUNTER — Encounter: Payer: Self-pay | Admitting: Internal Medicine

## 2023-01-23 ENCOUNTER — Encounter (HOSPITAL_COMMUNITY): Payer: Self-pay

## 2023-01-23 ENCOUNTER — Other Ambulatory Visit (HOSPITAL_COMMUNITY): Payer: Self-pay

## 2023-01-23 ENCOUNTER — Ambulatory Visit (HOSPITAL_COMMUNITY)
Admission: RE | Admit: 2023-01-23 | Discharge: 2023-01-23 | Disposition: A | Discharge status: Home / Self Care | Payer: Managed Care, Other (non HMO) | Source: Ambulatory Visit | Attending: Family Medicine | Admitting: Family Medicine

## 2023-01-23 VITALS — BP 132/82 | HR 90 | Wt 203.4 lb

## 2023-01-23 DIAGNOSIS — F32A Depression, unspecified: Secondary | ICD-10-CM | POA: Diagnosis not present

## 2023-01-23 DIAGNOSIS — N184 Chronic kidney disease, stage 4 (severe): Secondary | ICD-10-CM | POA: Insufficient documentation

## 2023-01-23 DIAGNOSIS — I340 Nonrheumatic mitral (valve) insufficiency: Secondary | ICD-10-CM | POA: Diagnosis not present

## 2023-01-23 DIAGNOSIS — I10 Essential (primary) hypertension: Secondary | ICD-10-CM | POA: Diagnosis not present

## 2023-01-23 DIAGNOSIS — I5022 Chronic systolic (congestive) heart failure: Secondary | ICD-10-CM | POA: Diagnosis not present

## 2023-01-23 DIAGNOSIS — I774 Celiac artery compression syndrome: Secondary | ICD-10-CM | POA: Insufficient documentation

## 2023-01-23 DIAGNOSIS — R361 Hematospermia: Secondary | ICD-10-CM | POA: Diagnosis not present

## 2023-01-23 DIAGNOSIS — I130 Hypertensive heart and chronic kidney disease with heart failure and stage 1 through stage 4 chronic kidney disease, or unspecified chronic kidney disease: Secondary | ICD-10-CM | POA: Insufficient documentation

## 2023-01-23 DIAGNOSIS — R9431 Abnormal electrocardiogram [ECG] [EKG]: Secondary | ICD-10-CM | POA: Insufficient documentation

## 2023-01-23 DIAGNOSIS — I5032 Chronic diastolic (congestive) heart failure: Secondary | ICD-10-CM | POA: Diagnosis present

## 2023-01-23 MED ORDER — DAPAGLIFLOZIN PROPANEDIOL 10 MG PO TABS
10.0000 mg | ORAL_TABLET | Freq: Every day | ORAL | 0 refills | Status: DC
  Filled 2023-01-23: qty 30, 30d supply, fill #0

## 2023-01-23 MED ORDER — CARVEDILOL 12.5 MG PO TABS
37.5000 mg | ORAL_TABLET | Freq: Two times a day (BID) | ORAL | 0 refills | Status: DC
  Filled 2023-01-23: qty 180, 30d supply, fill #0

## 2023-01-23 MED ORDER — TORSEMIDE 10 MG PO TABS
20.0000 mg | ORAL_TABLET | Freq: Every day | ORAL | 2 refills | Status: DC
  Filled 2023-01-23: qty 60, 30d supply, fill #0

## 2023-01-23 MED ORDER — HYDRALAZINE HCL 50 MG PO TABS
50.0000 mg | ORAL_TABLET | Freq: Two times a day (BID) | ORAL | 0 refills | Status: DC
  Filled 2023-01-23: qty 60, 30d supply, fill #0

## 2023-01-23 NOTE — Assessment & Plan Note (Signed)
Unsure of vascular surgery appointment. Patient reports that he remembers calling and talking to office but he cannot remember the outcome of that phone call. I do not see an appointment in the system currently. Vascular surgery saw him in hospital and notes mention medical management as plan. I would prioritize patient getting to nephrology appointment. No post-prandial pain. -Repeat LDL in 4 weeks

## 2023-01-23 NOTE — Progress Notes (Signed)
ADVANCED HEART FAILURE CLINIC NOTE  Referring Physician: Lovie Macadamia, MD  Primary Care: Lovie Macadamia, MD HF: Dr. Gasper Lloyd  HPI: Mark Hayden is a 47 y.o. male with uncontrolled hypertension, CKD 4, multiple admissions for hypertensive crisis and heart failure with preserved ejection fraction/now mildly reduced presenting today to establish care.  He has been admitted in May 2024 and June 2024 with hypertensive emergency requiring IV diuretics for volume overload and aggressive antihypertensive medications.  Based on chart review it appears that these admissions were secondary to dietary and medication noncompliance.  Since discharge she has [facility] where medications were slowly uptitrated and the importance of medication compliance was further pushed.  Family Hx: mother with heart failure/CKD/uncontrolled hypertension and dialysis.  Admitted 10/24 with HTN emergency 2/2 to medication non-compliance and PNA. GDMT restarted. 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 VVS consulted, plan for med therapy x 6 months with repeat duplex. "Dry weight" felt to be 204 lbs.  He was discharged home.  Today he returns for post hospital HF follow up. Overall feeling fine. 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 Having trouble remembering to take his Imdur at night, as it makes him tired. Denies palpitations, CP, dizziness, edema, or PND/Orthopnea. Appetite ok. No fever or chills. Weight at home 203 pounds. Struggling with depression, on sertraline. Smokes THC frequently. No drugs/ETOH. BP at hom 144/90. Had an episode of blood-tinged semen prior to hospitalization, he is concerned about this.    Past Medical History:  Diagnosis Date   CHF (congestive heart failure) (HCC)    Hypertension    Hypertensive emergency 08/20/2022   Pneumonia    Sleep apnea    Suicidal thoughts 09/08/2019   Current  Outpatient Medications  Medication Sig Dispense Refill   amLODipine (NORVASC) 10 MG tablet Take 1 tablet (10 mg total) by mouth daily. 180 tablet 3   aspirin EC 81 MG tablet Take 1 tablet (81 mg total) by mouth daily. Swallow whole. 30 tablet 0   Blood Pressure Monitor DEVI Use to check blood pressure daily 1 each 0   isosorbide mononitrate (IMDUR) 60 MG 24 hr tablet Take 1 tablet (60 mg total) by mouth at bedtime. 30 tablet 0   potassium chloride (KLOR-CON) 10 MEQ tablet Take 1 tablet (10 mEq total) by mouth daily. 90 tablet 3   rosuvastatin (CRESTOR) 10 MG tablet Take 1 tablet (10 mg total) by mouth daily. 30 tablet 1   sertraline (ZOLOFT) 50 MG tablet Take 1 tablet (50 mg total) by mouth daily. 30 tablet 2   sodium bicarbonate 650 MG tablet Take 1 tablet (650 mg total) by mouth 2 (two) times daily. 60 tablet 1   carvedilol (COREG) 12.5 MG tablet Take 3 tablets (37.5 mg total) by mouth 2 (two) times daily with a meal. 180 tablet 0   dapagliflozin propanediol (FARXIGA) 10 MG TABS tablet Take 1 tablet (10 mg total) by mouth daily. 30 tablet 0   hydrALAZINE (APRESOLINE) 50 MG tablet Take 1 tablet (50 mg total) by mouth every 12 (twelve) hours. 60 tablet 0   torsemide (DEMADEX) 10 MG tablet Take 2 tablets (20 mg total) by mouth daily. 60 tablet 2   No current facility-administered medications for this encounter.   Allergies  Allergen Reactions   Fish Allergy Anaphylaxis and Itching   Shellfish Allergy Anaphylaxis and Itching    seafood  Social History   Socioeconomic History   Marital status: Single    Spouse name: Not on file   Number of children: 1   Years of education: Not on file   Highest education level: High school graduate  Occupational History   Occupation: [employer]  Tobacco Use   Smoking status: Former   Smokeless tobacco: Never  Advertising account planner   Vaping status: Never Used  Substance and Sexual Activity   Alcohol use: No   Drug use: Yes    Types: Marijuana   Sexual  activity: Not on file  Other Topics Concern   Not on file  Social History Narrative   Not on file   Social Determinants of Health   Financial Resource Strain: High Risk (09/12/2022)   Overall Financial Resource Strain (CARDIA)    Difficulty of Paying Living Expenses: Very hard  Food Insecurity: No Food Insecurity (01/07/2023)   Hunger Vital Sign    Worried About Running Out of Food in the Last Year: Never true    Ran Out of Food in the Last Year: Never true  Transportation Needs: No Transportation Needs (01/07/2023)   PRAPARE - Administrator, Civil Service (Medical): No    Lack of Transportation (Non-Medical): No  Physical Activity: Not on file  Stress: Not on file  Social Connections: Not on file  Intimate Partner Violence: Not At Risk (01/07/2023)   Humiliation, Afraid, Rape, and Kick questionnaire    Fear of Current or Ex-Partner: No    Emotionally Abused: No    Physically Abused: No    Sexually Abused: No   Family History  Problem Relation Age of Onset   Diabetes Mother    Diabetes Father    Wt Readings from Last 3 Encounters:  01/23/23 92.3 kg (203 lb 6.4 oz)  01/21/23 93.3 kg (205 lb 9.6 oz)  01/11/23 93 kg (205 lb 1.6 oz)   BP 132/82 (Patient Position: Sitting)   Pulse 90   Wt 92.3 kg (203 lb 6.4 oz)   SpO2 98%   BMI 28.37 kg/m   PHYSICAL EXAM: General:  NAD. No resp difficulty HEENT: Normal Neck: Supple. No JVD. Carotids 2+ bilat; no bruits. No lymphadenopathy or thryomegaly appreciated. Cor: PMI nondisplaced. Regular rate & rhythm. No rubs, gallops or murmurs. Lungs: Clear Abdomen: Soft, nontender, nondistended. No hepatosplenomegaly. No bruits or masses. Good bowel sounds. Extremities: No cyanosis, clubbing, rash, edema Neuro: Alert & oriented x 3, cranial nerves grossly intact. Moves all 4 extremities w/o difficulty. Affect pleasant.  DATA REVIEW  ECG: 10/28/22: Normal sinus rhythm with LVH  01/23/23: NSR with LVH, personally  reviewed  ECHO: 08/18/2022: LVEF 45 to 50% with severe LVH, moderate to severe mitral regurgitation as per my personal interpretation   ASSESSMENT & PLAN:  Heart with mildly reduced EF - His echocardiogram demonstrates severe LVH.  This is very likely secondary to years of uncontrolled hypertension evidenced by multiple admissions for hypertensive emergency.  - Re-schedule cMRI soon  to rule out any infiltrative disease (HCM, amyloid) - NYHA I, he is not volume overloaded today. GDMT limited by CKD IV - Continue hydralazine 50 mg tid + Imdur 60 mg daily. - Continue Farxiga 10 mg daily. - Continue Coreg 37.5 mg bid. - Continue torsemide 20 mg daily + 10 KCL daily. - Recent labs reviewed, K 4.6, SCr 4.58  2.  Uncontrolled hypertension - Renal dopplers neg  - BP improving, continue to check BP daily. - GDMT as above -  We discussed strategies to help him remember to take his meds  3.  Moderate to severe MR - Severe MR in the setting of hypertensive emergency.   - Arrange cMRI as above   4.  CKD 4 - Unfortunately he will likely progress to ESRD.   - I emphasized the importance of medication compliance numerous times today. - He has been referred to nephrology. 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 Depression - Major barrier to medication compliance - On sertraline, he says this is helping.  7. Hematospermia - Hgb stable  - Needs PCP vs urology follow up  Follow up in 3-4 months with Dr. Annamary Rummage, FNP-BC 01/23/23

## 2023-01-23 NOTE — Patient Instructions (Addendum)
Medication Changes:  MEDICATIONS REFILLED AT [facility] [place]   PLEASE CHECK BLOOD PRESSURE AT HOME DAILY- WRITE THIS NUMBER DOWN AND KEEP A LOG  Testing/Procedures: SOMEONE WILL CALL YOU REGARDING GETTING THIS SCHEDULED   [facility] [address] [place], [place] [address] [phone] Please take advantage of the free valet parking available at the [facility] and [employer] (Entrance C).  Proceed to the [facility] Radiology Department (First Floor) for check-in.   Magnetic resonance imaging (MRI) is a painless test that produces images of the inside of the body without using Xrays.  During an MRI, strong magnets and radio waves work together in a Data processing manager to form detailed images.   MRI images may provide more details about a medical condition than X-rays, CT scans, and ultrasounds can provide.  You may be given earphones to listen for instructions.  You may eat a light breakfast and take medications as ordered with the exception of furosemide, hydrochlorothiazide, or spironolactone(fluid pill, other). If you are undergoing a stress MRI, please avoid stimulants for 12 hr prior to test. (Ie. Caffeine, nicotine, chocolate, or antihistamine medications)  An IV will be inserted into one of your veins. Contrast material will be injected into your IV. It will leave your body through your urine within a day. You may be told to drink plenty of fluids to help flush the contrast material out of your system.  You will be asked to remove all metal, including: Watch, jewelry, and other metal objects including hearing aids, hair pieces and dentures. Also wearable glucose monitoring systems (ie. Freestyle Libre and Omnipods) (Braces and fillings normally are not a problem.)  TEST WILL TAKE APPROXIMATELY 1 HOUR  PLEASE NOTIFY SCHEDULING AT LEAST 24 HOURS IN ADVANCE IF YOU ARE UNABLE TO KEEP YOUR APPOINTMENT. [phone]  For more information and  frequently asked questions, please visit our website : [email or web address]  Please call the Cardiac Imaging Nurse Navigators with any questions/concerns. [phone] Office   Follow-Up in: 3-4 MONTHS WITH DR. Gasper Lloyd PLEASE CALL OUR OFFICE AROUND DECEMBER TO GET SCHEDULED FOR YOUR APPOINTMENT. PHONE NUMBER IS [phone] OPTION 2   At the [facility], you and your health needs are our priority. We have a designated team specialized in the treatment of Heart Failure. This Care Team includes your primary Heart Failure Specialized Cardiologist (physician), Advanced Practice Providers (APPs- Physician Assistants and Nurse Practitioners), and Pharmacist who all work together to provide you with the care you need, when you need it.   You may see any of the following providers on your designated Care Team at your next follow up:  Dr. Arvilla Meres Dr. Marca Ancona Dr. Dorthula Nettles Dr. Theresia Bough Tonye Becket, NP Robbie Lis, [place] [facility] [place], [place] Brynda Peon, NP [place] Lee, NP Karle Plumber, PharmD   Please be sure to bring in all your medications bottles to every appointment.   Need to Contact us:  If you have any questions or concerns before your next appointment please send [place] a message through [place] or call our office at [phone].    TO LEAVE A MESSAGE FOR THE NURSE SELECT OPTION 2, PLEASE LEAVE A MESSAGE INCLUDING: YOUR NAME DATE OF BIRTH CALL BACK NUMBER REASON FOR CALL**this is important as we prioritize the call backs  YOU WILL RECEIVE A CALL BACK THE SAME DAY AS LONG AS YOU CALL BEFORE 4:00 PM

## 2023-01-25 NOTE — Progress Notes (Signed)
Internal Medicine Clinic Attending  Case discussed with the resident at the time of the visit.  We reviewed the resident's history and exam and pertinent patient test results.  I agree with the assessment, diagnosis, and plan of care documented in the resident's note.  

## 2023-02-04 ENCOUNTER — Ambulatory Visit: Payer: Managed Care, Other (non HMO) | Admitting: Licensed Clinical Social Worker

## 2023-02-04 DIAGNOSIS — F32A Depression, unspecified: Secondary | ICD-10-CM | POA: Diagnosis not present

## 2023-02-04 NOTE — BH Specialist Note (Signed)
Integrated Behavioral Health via Telemedicine Visit  02/04/2023 Mark Hayden [id number]  Number of Integrated Behavioral Health Clinician visits: No data recorded Session Start time: 1330   Session End time: 1430  Total time in minutes: 60   Referring Provider: PCP Patient/Family location: Home [facility] Provider location: Office All persons participating in visit: [facility] and Patient Types of Service: Introduction only  I connected with Mark Hayden  via  Telephone and verified that I am speaking with the correct person using two identifiers. Discussed confidentiality: Yes   I discussed the limitations of telemedicine and the availability of in person appointments.  Discussed there is a possibility of technology failure and discussed alternative modes of communication if that failure occurs.  I discussed that engaging in this telemedicine visit, they consent to the provision of behavioral healthcare and the services will be billed under their insurance.  Patient and/or legal guardian expressed understanding and consented to Telemedicine visit: Yes   Presenting Concerns: Patient and/or family reports the following symptoms/concerns: A 47 year old [place] American male patient, who is currently unemployed and covered by managed Medicaid, presented for an initial consultation with the Behavioral Health Consultant [facility]). The [facility] initiated the session by introducing themselves and elucidating their role in the patient's care. Following this, the [facility] provided a comprehensive overview of telehealth services, to which the patient expressed understanding and agreement. The consultation proceeded with the patient articulating his symptoms and outlining his goals for addressing his anxiety and depression. The [facility] actively listened and engaged in a preliminary discussion about these concerns. To ensure continuity of care, the [facility] scheduled a follow-up appointment for March 04, 2023, at  2:30 PM, allowing for a more in-depth exploration of the patient's mental health needs and the development of a tailored treatment plan.   Patient and/or Family's Strengths/Protective Factors: Social connections  Goals Addressed: Patient will:  Reduce symptoms of: anxiety and depression   Increase knowledge and/or ability of: coping skills   Demonstrate ability to: Increase healthy adjustment to current life circumstances  Progress towards Goals: Ongoing  Interventions: Interventions utilized:  CBT Cognitive Behavioral Therapy Standardized Assessments completed: PHQ-SADS     01/21/2023   11:34 AM 01/21/2023   11:33 AM 11/15/2022   10:56 AM  PHQ-SADS Last 3 Score only  Total GAD-7 Score  13 11  PHQ Adolescent Score 16  16     Patient and/or Family Response: Patient agreed to services  Assessment: Patient currently experiencing Depression and anxiety.   Patient may benefit from Individual therapy.  Plan: Follow up with behavioral health clinician on : 12/16 at 2:30 pm via telehealth  I discussed the assessment and treatment plan with the patient and/or parent/guardian. They were provided an opportunity to ask questions and all were answered. They agreed with the plan and demonstrated an understanding of the instructions.   They were advised to call back or seek an in-person evaluation if the symptoms worsen or if the condition fails to improve as anticipated.  Christen Butter, MSW, LCSW-A She/Her Behavioral Health Clinician [facility]  Internal Medicine Center Direct Dial:[phone]  Fax [phone] Main Office Phone: [phone] [address] [place] [place], [place] [address] Website: [facility] Internal Medicine [facility]  [place], [place]  [place]

## 2023-02-06 ENCOUNTER — Encounter (HOSPITAL_COMMUNITY): Payer: Commercial Managed Care - HMO

## 2023-02-06 ENCOUNTER — Telehealth (HOSPITAL_COMMUNITY): Payer: Self-pay

## 2023-02-06 NOTE — Telephone Encounter (Signed)
Called and was unable to leave patient a voice message  to confirm/remind patient of their appointment at the [facility] on 02/06/23.

## 2023-02-17 DIAGNOSIS — Z419 Encounter for procedure for purposes other than remedying health state, unspecified: Secondary | ICD-10-CM | POA: Diagnosis not present

## 2023-02-18 ENCOUNTER — Telehealth: Payer: Self-pay | Admitting: *Deleted

## 2023-02-18 ENCOUNTER — Encounter: Payer: Managed Care, Other (non HMO) | Admitting: Student

## 2023-02-18 NOTE — Telephone Encounter (Signed)
CM[facility] Berkley Harvey [id number]  Expires 05/19/23

## 2023-03-04 ENCOUNTER — Ambulatory Visit (INDEPENDENT_AMBULATORY_CARE_PROVIDER_SITE_OTHER): Payer: Self-pay | Admitting: Licensed Clinical Social Worker

## 2023-03-04 DIAGNOSIS — F32A Depression, unspecified: Secondary | ICD-10-CM

## 2023-03-04 NOTE — BH Specialist Note (Signed)
Patient no-showed today's appointment; appointment was for Telephone visit at 2:30Pm  Behavioral Health Clinician [facility] from here on out)  attempted patient twice via Telephone    [facility] contacted patient from telephone number [phone]. [facility] left a  VM.   Patient will need to reschedule appointment by calling Internal medicine center [phone].  Christen Butter, MSW, LCSW-A She/Her Behavioral Health Clinician [facility]  Internal Medicine Center Direct [phone]  Fax [phone] Main Office Phone: [phone] [address] [place] [place], [place] [address] Website: [facility] Internal Medicine [facility]  [place], [place]  [employer]

## 2023-03-06 ENCOUNTER — Ambulatory Visit (INDEPENDENT_AMBULATORY_CARE_PROVIDER_SITE_OTHER): Payer: Medicaid Other | Admitting: Licensed Clinical Social Worker

## 2023-03-06 DIAGNOSIS — F32A Depression, unspecified: Secondary | ICD-10-CM

## 2023-03-06 NOTE — BH Specialist Note (Signed)
Integrated Behavioral Health via Telemedicine Visit  03/06/2023 Mark Hayden [id number]  Number of Integrated Behavioral Health Clinician visits: No data recorded Session Start time: 1528   Session End time: 1534  Total time in minutes: 6  Referring Provider: PCP Patient/Family location: Home [facility] Provider location: office All persons participating in visit: [facility] and Patient Types of Service: Telephone visit  I connected with Mark Hayden  via  Telephone and verified that I am speaking with the correct person using two identifiers. Discussed confidentiality: Yes   I discussed the limitations of telemedicine and the availability of in person appointments.  Discussed there is a possibility of technology failure and discussed alternative modes of communication if that failure occurs.  I discussed that engaging in this telemedicine visit, they consent to the provision of behavioral healthcare and the services will be billed under their insurance.  Patient and/or legal guardian expressed understanding and consented to Telemedicine visit: Yes   Presenting Concerns: Patient and/or family reports the following symptoms/concerns: Patient stated he was uavailble at the time. Patient requested appointment to be rescheduled. [facility] rescheduled appointment to 01/13.  I discussed the assessment and treatment plan with the patient and/or parent/guardian. They were provided an opportunity to ask questions and all were answered. They agreed with the plan and demonstrated an understanding of the instructions.   They were advised to call back or seek an in-person evaluation if the symptoms worsen or if the condition fails to improve as anticipated.  Christen Butter, MSW, LCSW-A She/Her Behavioral Health Clinician [facility]  Internal Medicine Center Direct Dial:[phone]  Fax [phone] Main Office Phone: [phone] [address] [place] [place], [place] [address] Website: [facility] Internal Medicine [facility]  [place], [place]  [place]

## 2023-03-20 DIAGNOSIS — Z419 Encounter for procedure for purposes other than remedying health state, unspecified: Secondary | ICD-10-CM | POA: Diagnosis not present

## 2023-04-01 ENCOUNTER — Ambulatory Visit (INDEPENDENT_AMBULATORY_CARE_PROVIDER_SITE_OTHER): Payer: Medicaid Other | Admitting: Licensed Clinical Social Worker

## 2023-04-01 DIAGNOSIS — F32A Depression, unspecified: Secondary | ICD-10-CM

## 2023-04-01 NOTE — BH Specialist Note (Signed)
 Patient no-showed today's appointment; appointment was for Telephone visit at 3:00Pm  Behavioral Health Clinician [facility] from here on out)  attempted patient twice via Telephone .   [facility] contacted patient from telephone number [phone]. [facility] left a  VM.   Patient will need to reschedule appointment by calling Internal medicine center [phone].  Renda Pontes, MSW, LCSW-A She/Her Behavioral Health Clinician [facility]  Internal Medicine Center Direct Dial:[phone]  Fax [phone] Main Office Phone: [phone] [address] [place] [place], [place] [address] Website: [facility] Internal Medicine [facility]  [place], [place]  [place]

## 2023-04-20 DIAGNOSIS — Z419 Encounter for procedure for purposes other than remedying health state, unspecified: Secondary | ICD-10-CM | POA: Diagnosis not present

## 2023-04-30 NOTE — Telephone Encounter (Signed)
Fax came from [facility] that patient no showed twice for his cpap setup and his ONO test.

## 2023-05-18 DIAGNOSIS — Z419 Encounter for procedure for purposes other than remedying health state, unspecified: Secondary | ICD-10-CM | POA: Diagnosis not present

## 2023-06-07 ENCOUNTER — Ambulatory Visit (INDEPENDENT_AMBULATORY_CARE_PROVIDER_SITE_OTHER): Admitting: Student in an Organized Health Care Education/Training Program

## 2023-06-07 ENCOUNTER — Encounter: Payer: Self-pay | Admitting: Student in an Organized Health Care Education/Training Program

## 2023-06-07 VITALS — BP 140/100 | HR 97 | Temp 99.5°F | Ht 69.29 in | Wt 206.0 lb

## 2023-06-07 DIAGNOSIS — F32A Depression, unspecified: Secondary | ICD-10-CM | POA: Diagnosis not present

## 2023-06-07 DIAGNOSIS — N184 Chronic kidney disease, stage 4 (severe): Secondary | ICD-10-CM

## 2023-06-07 DIAGNOSIS — I10 Essential (primary) hypertension: Secondary | ICD-10-CM | POA: Diagnosis not present

## 2023-06-07 DIAGNOSIS — I5022 Chronic systolic (congestive) heart failure: Secondary | ICD-10-CM | POA: Diagnosis not present

## 2023-06-07 MED ORDER — HYDRALAZINE HCL 50 MG PO TABS: 50.0000 mg | ORAL_TABLET | Freq: Two times a day (BID) | ORAL | 3 refills | Status: DC

## 2023-06-07 MED ORDER — TORSEMIDE 10 MG PO TABS: 10.0000 mg | ORAL_TABLET | Freq: Every day | ORAL | 2 refills | Status: DC | PRN

## 2023-06-07 MED ORDER — SERTRALINE HCL 50 MG PO TABS: 50.0000 mg | ORAL_TABLET | Freq: Every day | ORAL | 2 refills | Status: DC

## 2023-06-07 MED ORDER — ISOSORBIDE MONONITRATE ER 60 MG PO TB24: 60.0000 mg | ORAL_TABLET | Freq: Every evening | ORAL | 3 refills | Status: DC

## 2023-06-07 MED ORDER — AMLODIPINE BESYLATE 10 MG PO TABS: 10.0000 mg | ORAL_TABLET | Freq: Every day | ORAL | 3 refills | Status: DC

## 2023-06-07 MED ORDER — DAPAGLIFLOZIN PROPANEDIOL 10 MG PO TABS: 10.0000 mg | ORAL_TABLET | Freq: Every day | ORAL | 3 refills | Status: DC

## 2023-06-07 MED ORDER — CARVEDILOL 12.5 MG PO TABS: 37.5000 mg | ORAL_TABLET | Freq: Two times a day (BID) | ORAL | 3 refills | Status: DC

## 2023-06-07 MED ORDER — SODIUM BICARBONATE 650 MG PO TABS: 650.0000 mg | ORAL_TABLET | Freq: Two times a day (BID) | ORAL | 1 refills | Status: DC

## 2023-06-07 NOTE — Assessment & Plan Note (Signed)
 Chronic heart failure with previously reduced ejection fraction to 45%.  Last echo in 2024 showed recovery with EF up to 65%.  He also has LVH.  This is likely related to longstanding severe hypertension.  Goal-directed medical therapy is limited by advanced CKD.  Plan for medical management with [name], carvedilol 37.5 mg twice daily, hydralazine and Imdur.  Medication consistency has been an issue.  He looks well compensated and euvolemic on exam today.  Previously was using torsemide 20 mg daily.  For now I think he could probably do 10 mg daily as needed with weight increase.  Today he is 206 pounds, same as he was in November.  I recommended he take torsemide if his weight increases above 210 pounds.

## 2023-06-07 NOTE — Assessment & Plan Note (Signed)
 Chronic problem.  Symptoms currently poorly controlled.  No symptoms or signs of delusions or psychosis.  Low risk for self-harm.  Will plan to start sertraline 50 mg daily which she has tolerated well in the past.  Follow-up in 1 month.

## 2023-06-07 NOTE — Assessment & Plan Note (Signed)
 Chronic severe asymptomatic hypertension with previous hospitalizations, complicated by difficulty consistently accessing medications.  I have reordered hydralazine, Imdur, amlodipine, carvedilol.

## 2023-06-07 NOTE — Progress Notes (Signed)
 Established Patient Office Visit  Subjective   Patient ID: Mark Hayden, male    DOB: 30-Aug-1975  Age: 48 y.o. MRN: [id number]  Chief Complaint  Patient presents with   Transitions Of Care    Patient states he needs refill for medications.    HPI  48 year old person here to transfer his care from the [facility] to our clinic and [place] because it is much closer to where he lives in [place].  He has several chronic medical issues including severe hypertension with prior history of hospitalizations for hypertensive emergency.  He has a history of volume overload and mildly reduced ejection fraction with LVH.  He also has advanced chronic kidney disease.     Objective:     BP (!) 140/100   Pulse 97   Temp 99.5 F (37.5 C) (Temporal)   Ht 5' 9.29" (1.76 m)   Wt 206 lb (93.4 kg)   SpO2 100%   BMI 30.17 kg/m    Physical Exam  General: Well-appearing man Neck: Normal thyroid, no adenopathy Heart: Regular, no murmur Lungs: Unlabored, clear throughout Extremities: Warm, well-perfused, no lower extremity edema Psych: A little disheveled, slow and thoughtful in his speech, linear thinking, clear communication, not depressed or anxious. Neuro: Alert, conversational, full strength upper and lower extremities    Assessment & Plan:   Problem List Items Addressed This Visit       High   CKD (chronic kidney disease) stage 4, GFR 15-29 ml/min (HCC) (Chronic)   CKD stage IV.  Patient appears euvolemic today.  Most likely etiology is from longstanding hypertension.  Last estimated GFR around 15.  Complicated by a metabolic acidosis.  Nonadherence to medications recently, ran out.  I think about strategies to control his blood pressure best we can.  He has had hospitalizations in the past for hypertensive emergency.  I have refilled amlodipine, hydralazine, and Imdur.  Previously using [place], I am not sure how effective this will be given his low GFR.   Will use torsemide as needed for issues with fluid retention, looks euvolemic on exam today.  He has been referred to nephrology in the past, but difficulty accessing them.      Relevant Medications   sodium bicarbonate 650 MG tablet   Other Relevant Orders   Basic metabolic panel   CBC   Hypertension (Chronic)   Chronic severe asymptomatic hypertension with previous hospitalizations, complicated by difficulty consistently accessing medications.  I have reordered hydralazine, Imdur, amlodipine, carvedilol.      Relevant Medications   amLODipine (NORVASC) 10 MG tablet   carvedilol (COREG) 12.5 MG tablet   hydrALAZINE (APRESOLINE) 50 MG tablet   isosorbide mononitrate (IMDUR) 60 MG 24 hr tablet   torsemide (DEMADEX) 10 MG tablet   Heart failure with mildly reduced ejection fraction (HFmrEF) (HCC) - Primary (Chronic)   Chronic heart failure with previously reduced ejection fraction to 45%.  Last echo in 2024 showed recovery with EF up to 65%.  He also has LVH.  This is likely related to longstanding severe hypertension.  Goal-directed medical therapy is limited by advanced CKD.  Plan for medical management with [name], carvedilol 37.5 mg twice daily, hydralazine and Imdur.  Medication consistency has been an issue.  He looks well compensated and euvolemic on exam today.  Previously was using torsemide 20 mg daily.  For now I think he could probably do 10 mg daily as needed with weight increase.  Today he is 206 pounds, same  as he was in November.  I recommended he take torsemide if his weight increases above 210 pounds.      Relevant Medications   amLODipine (NORVASC) 10 MG tablet   carvedilol (COREG) 12.5 MG tablet   dapagliflozin propanediol (FARXIGA) 10 MG TABS tablet   hydrALAZINE (APRESOLINE) 50 MG tablet   isosorbide mononitrate (IMDUR) 60 MG 24 hr tablet   torsemide (DEMADEX) 10 MG tablet   Other Relevant Orders   Lipid panel   Hemoglobin A1c   Depression (Chronic)   Chronic  problem.  Symptoms currently poorly controlled.  No symptoms or signs of delusions or psychosis.  Low risk for self-harm.  Will plan to start sertraline 50 mg daily which she has tolerated well in the past.  Follow-up in 1 month.      Relevant Medications   sertraline (ZOLOFT) 50 MG tablet    Return in about 4 weeks (around 07/05/2023).    Tyson Alias, MD

## 2023-06-07 NOTE — Assessment & Plan Note (Signed)
 CKD stage IV.  Patient appears euvolemic today.  Most likely etiology is from longstanding hypertension.  Last estimated GFR around 15.  Complicated by a metabolic acidosis.  Nonadherence to medications recently, ran out.  I think about strategies to control his blood pressure best we can.  He has had hospitalizations in the past for hypertensive emergency.  I have refilled amlodipine, hydralazine, and Imdur.  Previously using [place], I am not sure how effective this will be given his low GFR.  Will use torsemide as needed for issues with fluid retention, looks euvolemic on exam today.  He has been referred to nephrology in the past, but difficulty accessing them.

## 2023-06-11 ENCOUNTER — Other Ambulatory Visit (INDEPENDENT_AMBULATORY_CARE_PROVIDER_SITE_OTHER)

## 2023-06-11 DIAGNOSIS — I5022 Chronic systolic (congestive) heart failure: Secondary | ICD-10-CM | POA: Diagnosis not present

## 2023-06-11 DIAGNOSIS — N184 Chronic kidney disease, stage 4 (severe): Secondary | ICD-10-CM | POA: Diagnosis not present

## 2023-06-12 ENCOUNTER — Telehealth: Payer: Self-pay

## 2023-06-12 LAB — BASIC METABOLIC PANEL
BUN: 34 mg/dL — ABNORMAL HIGH (ref 6–23)
CO2: 24 meq/L (ref 19–32)
Calcium: 7.4 mg/dL — ABNORMAL LOW (ref 8.4–10.5)
Chloride: 111 meq/L (ref 96–112)
Creatinine, Ser: 4.46 mg/dL — ABNORMAL HIGH (ref 0.40–1.50)
GFR: 15.02 mL/min — ABNORMAL LOW (ref >60.00)
Glucose, Bld: 90 mg/dL (ref 70–99)
Potassium: 3.7 meq/L (ref 3.5–5.1)
Sodium: 144 meq/L (ref 135–145)

## 2023-06-12 LAB — LIPID PANEL
Cholesterol: 152 mg/dL (ref 0–200)
HDL: 45.9 mg/dL (ref >39.00)
LDL Cholesterol: 89 mg/dL (ref 0–99)
NonHDL: 105.73
Total CHOL/HDL Ratio: 3
Triglycerides: 86 mg/dL (ref 0.0–149.0)
VLDL: 17.2 mg/dL (ref 0.0–40.0)

## 2023-06-12 LAB — HEMOGLOBIN A1C: Hgb A1c MFr Bld: 5.3 % (ref 4.6–6.5)

## 2023-06-12 LAB — CBC
HCT: 35 % — ABNORMAL LOW (ref 39.0–52.0)
Hemoglobin: 11.7 g/dL — ABNORMAL LOW (ref 13.0–17.0)
MCHC: 33.4 g/dL (ref 30.0–36.0)
MCV: 92.3 fl (ref 78.0–100.0)
Platelets: 172 10*3/uL (ref 150.0–400.0)
RBC: 3.79 Mil/uL — ABNORMAL LOW (ref 4.22–5.81)
RDW: 13.7 % (ref 11.5–15.5)
WBC: 3.8 10*3/uL — ABNORMAL LOW (ref 4.0–10.5)

## 2023-06-12 NOTE — Telephone Encounter (Signed)
 Called patient to go over lab results, Left Vm to return call.

## 2023-06-12 NOTE — Telephone Encounter (Signed)
-----   Message from Tyson Alias sent at 06/12/2023  1:23 PM EDT -----  ----- Message ----- From: Interface, Lab In Three Zero One Sent: 06/12/2023   1:01 PM EDT To: Tyson Alias, MD

## 2023-06-13 NOTE — Telephone Encounter (Signed)
 Called patient for lab results, Left Vm to return call.

## 2023-06-14 NOTE — Telephone Encounter (Signed)
 Lab results per Dr.Vincent   Please let patient know that the blood work looks fine. His kidney function is stable. Cholesterol is at goal and blood sugar looks fine. I think it is safe for him to continue his medications as they are prescribed. I recommend he finds some time to visit his kidney doctor in the next few months. I will talk to him more about his medicine at our next visit in April.

## 2023-06-14 NOTE — Telephone Encounter (Signed)
 Left vm to call office about results

## 2023-06-14 NOTE — Telephone Encounter (Signed)
 Letter out.

## 2023-06-24 ENCOUNTER — Other Ambulatory Visit (HOSPITAL_COMMUNITY): Payer: Self-pay

## 2023-06-25 ENCOUNTER — Telehealth: Payer: Self-pay

## 2023-06-25 ENCOUNTER — Other Ambulatory Visit (HOSPITAL_COMMUNITY): Payer: Self-pay

## 2023-06-25 NOTE — Telephone Encounter (Signed)
 Pharmacy Patient Advocate Encounter   Received notification from CoverMyMeds that prior authorization for Dapagliflozin Propanediol 10MG  tablets is required/requested.   Insurance verification completed.   The patient is insured through [facility] [place] .   Per test claim: PA required; PA submitted to above mentioned insurance via CoverMyMeds Key/confirmation #/EOC [facility] Status is pending

## 2023-06-25 NOTE — Telephone Encounter (Signed)
 Pharmacy Patient Advocate Encounter  Received notification from [facility] Medicaid that Prior Authorization for [name] has been APPROVED from 06/25/2023 to 06/24/2024   PA #/Case ID/Reference #: [id number]

## 2023-06-29 DIAGNOSIS — Z419 Encounter for procedure for purposes other than remedying health state, unspecified: Secondary | ICD-10-CM | POA: Diagnosis not present

## 2023-07-05 ENCOUNTER — Telehealth (HOSPITAL_COMMUNITY): Payer: Self-pay | Admitting: Cardiology

## 2023-07-05 ENCOUNTER — Ambulatory Visit: Admitting: Student in an Organized Health Care Education/Training Program

## 2023-07-22 ENCOUNTER — Telehealth: Payer: Self-pay | Admitting: *Deleted

## 2023-07-22 NOTE — Telephone Encounter (Signed)
 Fax came for patient stating the ONO was cancelled because they were unable to schedule the test.

## 2023-07-29 DIAGNOSIS — Z419 Encounter for procedure for purposes other than remedying health state, unspecified: Secondary | ICD-10-CM | POA: Diagnosis not present

## 2023-08-02 ENCOUNTER — Other Ambulatory Visit: Payer: Self-pay | Admitting: *Deleted

## 2023-08-02 DIAGNOSIS — I774 Celiac artery compression syndrome: Secondary | ICD-10-CM

## 2023-08-05 ENCOUNTER — Telehealth (HOSPITAL_COMMUNITY): Payer: Self-pay | Admitting: Cardiology

## 2023-08-12 NOTE — Progress Notes (Deleted)
 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 Blood glucose control with goal A1c < 7%. Blood pressure control with goal blood pressure < 140/90 mmHg. Lipid reduction therapy with goal LDL-C <55 mg/dL. Aspirin  81mg  PO QD.  Atorvastatin 40-80mg  PO QD (or other "high intensity" statin therapy).   CHIEF COMPLAINT: ***  HISTORY OF PRESENT ILLNESS: Mark Hayden is a 48 y.o. male ***  VASCULAR SURGICAL HISTORY: ***  VASCULAR RISK FACTORS: {FINDINGS; POSITIVE NEGATIVE:[id number]} history of stroke / transient ischemic attack. {FINDINGS; POSITIVE NEGATIVE:[id number]} history of coronary artery disease. *** history of PCI. *** history of CABG.  {FINDINGS; POSITIVE NEGATIVE:[id number]} history of diabetes mellitus. Last A1c ***. {FINDINGS; POSITIVE NEGATIVE:[id number]} history of smoking. *** actively smoking. {FINDINGS; POSITIVE NEGATIVE:[id number]} history of hypertension. *** drug regimen with *** control. {FINDINGS; POSITIVE NEGATIVE:[id number]} history of chronic kidney disease.  Last GFR ***. CKD {stage:30421363}. {FINDINGS; POSITIVE NEGATIVE:[phone]} history of chronic obstructive pulmonary disease, treated with ***.  FUNCTIONAL STATUS: ECOG performance status: {findings; ecog performance status:31780} Ambulatory status: {TNHAmbulation:25868}  CAREY 1 AND 3 YEAR INDEX Male (2pts) 75-79 or 80-84 (2pts) >84 (3pts) Dependence in toileting (1pt) Partial or full dependence in dressing (1pt) History of malignant neoplasm (2pts) CHF (3pts) COPD (1pts) CKD (3pts)  0-3 pts 6% 1 year mortality ; 21% 3 year mortality 4-5 pts 12% 1 year mortality ; 36% 3 year mortality >5 pts 21% 1 year mortality; 54% 3 year mortality   Past Medical History:  Diagnosis Date   CHF (congestive heart failure) (HCC)    Hypertension    Hypertensive emergency 08/20/2022    Pneumonia    Sleep apnea    Suicidal thoughts 09/08/2019    No past surgical history on file.  Family History  Problem Relation Age of Onset   Diabetes Mother    Diabetes Father     Social History   Socioeconomic History   Marital status: Single    Spouse name: Not on file   Number of children: 1   Years of education: Not on file   Highest education level: High school graduate  Occupational History   Occupation: [employer]  Tobacco Use   Smoking status: Former   Smokeless tobacco: Never  Advertising account planner   Vaping status: Never Used  Substance and Sexual Activity   Alcohol use: No   Drug use: Yes    Types: Marijuana   Sexual activity: Not on file  Other Topics Concern   Not on file  Social History Narrative   Not on file   Social Drivers of Health   Financial Resource Strain: High Risk (09/12/2022)   Overall Financial Resource Strain (CARDIA)    Difficulty of Paying Living Expenses: Very hard  Food Insecurity: No Food Insecurity (01/07/2023)   Hunger Vital Sign    Worried About Running Out of Food in the Last Year: Never true    Ran Out of Food in the Last Year: Never true  Transportation Needs: No Transportation Needs (01/07/2023)   PRAPARE - Administrator, Civil Service (Medical): No    Lack of Transportation (Non-Medical): No  Physical Activity: Not on file  Stress: Not on file  Social Connections: Not on file  Intimate Partner Violence: Not At Risk (01/07/2023)   Humiliation, Afraid, Rape, and Kick questionnaire    Fear of Current or Ex-Partner: No    Emotionally Abused: No    Physically Abused: No  Sexually Abused: No    Allergies  Allergen Reactions   Fish Allergy Anaphylaxis and Itching   Shellfish Allergy Anaphylaxis and Itching    seafood    Current Outpatient Medications  Medication Sig Dispense Refill   amLODipine  (NORVASC ) 10 MG tablet Take 1 tablet (10 mg total) by mouth daily. 90 tablet 3   Blood Pressure Monitor DEVI Use to  check blood pressure daily 1 each 0   carvedilol  (COREG ) 12.5 MG tablet Take 3 tablets (37.5 mg total) by mouth 2 (two) times daily with a meal. 180 tablet 3   dapagliflozin  propanediol (FARXIGA ) 10 MG TABS tablet Take 1 tablet (10 mg total) by mouth daily. 90 tablet 3   hydrALAZINE  (APRESOLINE ) 50 MG tablet Take 1 tablet (50 mg total) by mouth every 12 (twelve) hours. 180 tablet 3   isosorbide  mononitrate (IMDUR ) 60 MG 24 hr tablet Take 1 tablet (60 mg total) by mouth at bedtime. 90 tablet 3   sertraline  (ZOLOFT ) 50 MG tablet Take 1 tablet (50 mg total) by mouth daily. 90 tablet 2   sodium bicarbonate  650 MG tablet Take 1 tablet (650 mg total) by mouth 2 (two) times daily. 60 tablet 1   torsemide  (DEMADEX ) 10 MG tablet Take 1 tablet (10 mg total) by mouth daily as needed (weight gain). 60 tablet 2   No current facility-administered medications for this visit.    PHYSICAL EXAM There were no vitals filed for this visit.  Constitutional: *** appearing. *** distress. Appears *** nourished.  Neurologic: CN ***. *** focal findings. *** sensory loss. Psychiatric: *** Mood and affect symmetric and appropriate. Eyes: *** No icterus. No conjunctival pallor. Ears, nose, throat: *** mucous membranes moist. Midline trachea.  Cardiac: *** rate and rhythm.  Respiratory: *** unlabored. Abdominal: *** soft, non-tender, non-distended.  Peripheral vascular: *** Extremity: *** edema. *** cyanosis. *** pallor.  Skin: *** gangrene. *** ulceration.  Lymphatic: *** Stemmer's sign. *** palpable lymphadenopathy.    PERTINENT LABORATORY AND RADIOLOGIC DATA  Most recent CBC    Latest Ref Rng & Units 06/11/2023    1:15 PM 01/11/2023    4:21 AM 01/10/2023    4:28 AM  CBC  WBC 4.0 - 10.5 K/uL 3.8  5.4  5.9   Hemoglobin 13.0 - 17.0 g/dL 16.1  09.6  04.5   Hematocrit 39.0 - 52.0 % 35.0  31.1  30.4   Platelets 150.0 - 400.0 K/uL 172.0  187  176      Most recent CMP    Latest Ref Rng & Units 06/11/2023     1:15 PM 01/21/2023   12:09 PM 01/11/2023    4:21 AM  CMP  Glucose 70 - 99 mg/dL 90  86  409   BUN 6 - 23 mg/dL 34  61  44   Creatinine 0.40 - 1.50 mg/dL 8.11  9.14  7.82   Sodium 135 - 145 mEq/L 144  141  138   Potassium 3.5 - 5.1 mEq/L 3.7  4.6  3.8   Chloride 96 - 112 mEq/L 111  109  106   CO2 19 - 32 mEq/L 24  16  22    Calcium  8.4 - 10.5 mg/dL 7.4  7.5  7.9   Total Protein 6.5 - 8.1 g/dL   6.4   Total Bilirubin 0.3 - 1.2 mg/dL   0.4   Alkaline Phos 38 - 126 U/L   74   AST 15 - 41 U/L   15   ALT 0 -  44 U/L   17     Renal function CrCl cannot be calculated (Patient's most recent lab result is older than the maximum 21 days allowed.).  Hgb A1c MFr Bld (%)  Date Value  06/11/2023 5.3    LDL Cholesterol  Date Value Ref Range Status  06/11/2023 89 0 - 99 mg/dL Final     Vascular Imaging: ***  Niasha Devins N. Edgardo Goodwill, MD [facility] Phone Number: [phone] 08/12/2023 6:55 PM   Total time spent on preparing this encounter including chart review, data review, collecting history, examining the patient, and coordinating care: {tnhtimebilling:26202} {billinglist:27273}  Portions of this report may have been transcribed using voice recognition software.  Every effort has been made to ensure accuracy; however, inadvertent computerized transcription errors may still be present.

## 2023-08-13 ENCOUNTER — Encounter (HOSPITAL_COMMUNITY): Payer: Commercial Managed Care - HMO

## 2023-08-13 ENCOUNTER — Encounter: Payer: Commercial Managed Care - HMO | Admitting: Vascular Surgery

## 2023-08-13 ENCOUNTER — Ambulatory Visit: Attending: Vascular Surgery | Admitting: Vascular Surgery

## 2023-08-13 ENCOUNTER — Ambulatory Visit (HOSPITAL_COMMUNITY): Attending: Infectious Diseases

## 2023-08-28 ENCOUNTER — Encounter (HOSPITAL_COMMUNITY): Payer: Self-pay

## 2023-08-28 ENCOUNTER — Other Ambulatory Visit: Payer: Self-pay

## 2023-08-28 ENCOUNTER — Ambulatory Visit (HOSPITAL_COMMUNITY): Admission: EM | Admit: 2023-08-28 | Discharge: 2023-08-28 | Disposition: A | Discharge status: Home / Self Care

## 2023-08-28 ENCOUNTER — Inpatient Hospital Stay (HOSPITAL_COMMUNITY)
Admission: EM | Admit: 2023-08-28 | Discharge: 2023-09-02 | DRG: 304 | Disposition: A | Discharge status: Home / Self Care | Source: Ambulatory Visit | Attending: Internal Medicine | Admitting: Internal Medicine

## 2023-08-28 ENCOUNTER — Emergency Department (HOSPITAL_COMMUNITY)

## 2023-08-28 ENCOUNTER — Encounter (HOSPITAL_COMMUNITY): Payer: Self-pay | Admitting: Emergency Medicine

## 2023-08-28 DIAGNOSIS — Z91128 Patient's intentional underdosing of medication regimen for other reason: Secondary | ICD-10-CM | POA: Diagnosis not present

## 2023-08-28 DIAGNOSIS — R0602 Shortness of breath: Secondary | ICD-10-CM | POA: Diagnosis not present

## 2023-08-28 DIAGNOSIS — I2489 Other forms of acute ischemic heart disease: Secondary | ICD-10-CM

## 2023-08-28 DIAGNOSIS — Z419 Encounter for procedure for purposes other than remedying health state, unspecified: Secondary | ICD-10-CM | POA: Diagnosis not present

## 2023-08-28 DIAGNOSIS — Z5986 Financial insecurity: Secondary | ICD-10-CM

## 2023-08-28 DIAGNOSIS — N179 Acute kidney failure, unspecified: Secondary | ICD-10-CM | POA: Diagnosis present

## 2023-08-28 DIAGNOSIS — Z91013 Allergy to seafood: Secondary | ICD-10-CM

## 2023-08-28 DIAGNOSIS — I472 Ventricular tachycardia, unspecified: Secondary | ICD-10-CM | POA: Diagnosis not present

## 2023-08-28 DIAGNOSIS — N184 Chronic kidney disease, stage 4 (severe): Secondary | ICD-10-CM | POA: Diagnosis not present

## 2023-08-28 DIAGNOSIS — I160 Hypertensive urgency: Secondary | ICD-10-CM | POA: Diagnosis not present

## 2023-08-28 DIAGNOSIS — Z79899 Other long term (current) drug therapy: Secondary | ICD-10-CM

## 2023-08-28 DIAGNOSIS — I509 Heart failure, unspecified: Secondary | ICD-10-CM

## 2023-08-28 DIAGNOSIS — I5032 Chronic diastolic (congestive) heart failure: Secondary | ICD-10-CM | POA: Diagnosis present

## 2023-08-28 DIAGNOSIS — I517 Cardiomegaly: Secondary | ICD-10-CM | POA: Diagnosis not present

## 2023-08-28 DIAGNOSIS — I5030 Unspecified diastolic (congestive) heart failure: Secondary | ICD-10-CM | POA: Diagnosis not present

## 2023-08-28 DIAGNOSIS — E872 Acidosis, unspecified: Secondary | ICD-10-CM | POA: Diagnosis present

## 2023-08-28 DIAGNOSIS — F32A Depression, unspecified: Secondary | ICD-10-CM | POA: Diagnosis present

## 2023-08-28 DIAGNOSIS — E876 Hypokalemia: Secondary | ICD-10-CM | POA: Diagnosis present

## 2023-08-28 DIAGNOSIS — I130 Hypertensive heart and chronic kidney disease with heart failure and stage 1 through stage 4 chronic kidney disease, or unspecified chronic kidney disease: Secondary | ICD-10-CM | POA: Diagnosis present

## 2023-08-28 DIAGNOSIS — I10 Essential (primary) hypertension: Secondary | ICD-10-CM

## 2023-08-28 DIAGNOSIS — I5033 Acute on chronic diastolic (congestive) heart failure: Secondary | ICD-10-CM | POA: Diagnosis present

## 2023-08-28 DIAGNOSIS — Z87891 Personal history of nicotine dependence: Secondary | ICD-10-CM

## 2023-08-28 DIAGNOSIS — I5022 Chronic systolic (congestive) heart failure: Secondary | ICD-10-CM

## 2023-08-28 DIAGNOSIS — R7989 Other specified abnormal findings of blood chemistry: Secondary | ICD-10-CM | POA: Diagnosis present

## 2023-08-28 DIAGNOSIS — I161 Hypertensive emergency: Principal | ICD-10-CM

## 2023-08-28 DIAGNOSIS — Z833 Family history of diabetes mellitus: Secondary | ICD-10-CM | POA: Diagnosis not present

## 2023-08-28 DIAGNOSIS — Z91148 Patient's other noncompliance with medication regimen for other reason: Secondary | ICD-10-CM

## 2023-08-28 DIAGNOSIS — I774 Celiac artery compression syndrome: Secondary | ICD-10-CM | POA: Diagnosis not present

## 2023-08-28 DIAGNOSIS — I169 Hypertensive crisis, unspecified: Secondary | ICD-10-CM | POA: Diagnosis not present

## 2023-08-28 LAB — TROPONIN I (HIGH SENSITIVITY)
Troponin I (High Sensitivity): 201 ng/L (ref <18)
Troponin I (High Sensitivity): 213 ng/L (ref <18)

## 2023-08-28 LAB — CBC
HCT: 33 % — ABNORMAL LOW (ref 39.0–52.0)
Hemoglobin: 10.4 g/dL — ABNORMAL LOW (ref 13.0–17.0)
MCH: 30.8 pg (ref 26.0–34.0)
MCHC: 31.5 g/dL (ref 30.0–36.0)
MCV: 97.6 fL (ref 80.0–100.0)
Platelets: 190 10*3/uL (ref 150–400)
RBC: 3.38 MIL/uL — ABNORMAL LOW (ref 4.22–5.81)
RDW: 14.2 % (ref 11.5–15.5)
WBC: 8.3 10*3/uL (ref 4.0–10.5)
nRBC: 0 % (ref 0.0–0.2)

## 2023-08-28 LAB — BASIC METABOLIC PANEL WITH GFR
Anion gap: 11 (ref 5–15)
BUN: 40 mg/dL — ABNORMAL HIGH (ref 6–20)
CO2: 19 mmol/L — ABNORMAL LOW (ref 22–32)
Calcium: 7.2 mg/dL — ABNORMAL LOW (ref 8.9–10.3)
Chloride: 113 mmol/L — ABNORMAL HIGH (ref 98–111)
Creatinine, Ser: 5.03 mg/dL — ABNORMAL HIGH (ref 0.61–1.24)
GFR, Estimated: 14 mL/min — ABNORMAL LOW (ref >60)
Glucose, Bld: 94 mg/dL (ref 70–99)
Potassium: 3.5 mmol/L (ref 3.5–5.1)
Sodium: 143 mmol/L (ref 135–145)

## 2023-08-28 LAB — BRAIN NATRIURETIC PEPTIDE: B Natriuretic Peptide: 631.6 pg/mL — ABNORMAL HIGH (ref 0.0–100.0)

## 2023-08-28 MED ORDER — ACETAMINOPHEN 650 MG RE SUPP: 650.0000 mg | Freq: Four times a day (QID) | RECTAL | Status: DC | PRN

## 2023-08-28 MED ORDER — SERTRALINE HCL 50 MG PO TABS
50.0000 mg | ORAL_TABLET | Freq: Every day | ORAL | Status: DC
  Administered 2023-08-29 – 2023-09-02 (×5): 50 mg via ORAL
  Filled 2023-08-28 (×4): qty 1

## 2023-08-28 MED ORDER — SODIUM CHLORIDE 0.9% FLUSH
3.0000 mL | Freq: Two times a day (BID) | INTRAVENOUS | Status: DC
  Administered 2023-08-29 – 2023-09-02 (×7): 3 mL via INTRAVENOUS

## 2023-08-28 MED ORDER — SODIUM BICARBONATE 650 MG PO TABS
650.0000 mg | ORAL_TABLET | Freq: Two times a day (BID) | ORAL | Status: DC
  Administered 2023-08-29 – 2023-09-02 (×10): 650 mg via ORAL
  Filled 2023-08-28 (×10): qty 1

## 2023-08-28 MED ORDER — FUROSEMIDE 10 MG/ML IJ SOLN
40.0000 mg | Freq: Once | INTRAMUSCULAR | Status: AC
  Administered 2023-08-28: 40 mg via INTRAVENOUS
  Filled 2023-08-28: qty 4

## 2023-08-28 MED ORDER — ASPIRIN 325 MG PO TABS
325.0000 mg | ORAL_TABLET | Freq: Every day | ORAL | Status: DC
  Administered 2023-08-28: 325 mg via ORAL
  Filled 2023-08-28: qty 1

## 2023-08-28 MED ORDER — OXYCODONE HCL 5 MG PO TABS: 5.0000 mg | ORAL_TABLET | ORAL | Status: DC | PRN

## 2023-08-28 MED ORDER — HYDRALAZINE HCL 20 MG/ML IJ SOLN
10.0000 mg | Freq: Once | INTRAMUSCULAR | Status: AC
  Administered 2023-08-28: 10 mg via INTRAVENOUS
  Filled 2023-08-28: qty 1

## 2023-08-28 MED ORDER — ISOSORBIDE MONONITRATE ER 60 MG PO TB24
60.0000 mg | ORAL_TABLET | Freq: Every day | ORAL | Status: DC
  Administered 2023-08-29 – 2023-09-01 (×4): 60 mg via ORAL
  Filled 2023-08-28 (×2): qty 1
  Filled 2023-08-28: qty 2
  Filled 2023-08-28: qty 1

## 2023-08-28 MED ORDER — CARVEDILOL 12.5 MG PO TABS: 37.5000 mg | ORAL_TABLET | Freq: Two times a day (BID) | ORAL | Status: DC

## 2023-08-28 MED ORDER — AMLODIPINE BESYLATE 10 MG PO TABS
10.0000 mg | ORAL_TABLET | Freq: Every day | ORAL | Status: DC
  Administered 2023-08-29 – 2023-09-02 (×5): 10 mg via ORAL
  Filled 2023-08-28: qty 2
  Filled 2023-08-28 (×4): qty 1

## 2023-08-28 MED ORDER — ACETAMINOPHEN 325 MG PO TABS
650.0000 mg | ORAL_TABLET | Freq: Four times a day (QID) | ORAL | Status: DC | PRN
  Administered 2023-08-29 – 2023-09-01 (×6): 650 mg via ORAL
  Filled 2023-08-28 (×6): qty 2

## 2023-08-28 MED ORDER — HYDRALAZINE HCL 50 MG PO TABS
50.0000 mg | ORAL_TABLET | Freq: Two times a day (BID) | ORAL | Status: DC
  Administered 2023-08-29 (×3): 50 mg via ORAL
  Filled 2023-08-28 (×2): qty 2
  Filled 2023-08-28: qty 1

## 2023-08-28 MED ORDER — FUROSEMIDE 10 MG/ML IJ SOLN
40.0000 mg | Freq: Two times a day (BID) | INTRAMUSCULAR | Status: DC
  Administered 2023-08-29 – 2023-08-30 (×3): 40 mg via INTRAVENOUS
  Filled 2023-08-28 (×3): qty 4

## 2023-08-28 MED ORDER — AMLODIPINE BESYLATE 5 MG PO TABS
10.0000 mg | ORAL_TABLET | Freq: Once | ORAL | Status: AC
  Administered 2023-08-28: 10 mg via ORAL
  Filled 2023-08-28: qty 2

## 2023-08-28 MED ORDER — HEPARIN SODIUM (PORCINE) 5000 UNIT/ML IJ SOLN
5000.0000 [IU] | Freq: Three times a day (TID) | INTRAMUSCULAR | Status: DC
  Administered 2023-08-29 – 2023-09-02 (×12): 5000 [IU] via SUBCUTANEOUS
  Filled 2023-08-28 (×12): qty 1

## 2023-08-28 MED ORDER — ASPIRIN 81 MG PO TBEC
81.0000 mg | DELAYED_RELEASE_TABLET | Freq: Every day | ORAL | Status: DC
  Administered 2023-08-29 – 2023-09-02 (×5): 81 mg via ORAL
  Filled 2023-08-28 (×5): qty 1

## 2023-08-28 MED ORDER — NITROGLYCERIN IN D5W 200-5 MCG/ML-% IV SOLN
0.0000 ug/min | INTRAVENOUS | Status: DC
  Administered 2023-08-29: 10 ug/min via INTRAVENOUS
  Administered 2023-08-30: 35 ug/min via INTRAVENOUS
  Administered 2023-08-31: 50 ug/min via INTRAVENOUS
  Filled 2023-08-28 (×3): qty 250

## 2023-08-28 NOTE — ED Notes (Signed)
 Report given to Carolynne Citron, RN CN

## 2023-08-28 NOTE — ED Notes (Signed)
 Patient is being discharged from the Urgent Care and sent to the Emergency Department via [facility] . Per Moira Andrews, NP, patient is in need of higher level of care due to CHF/SOB. Patient is aware and verbalizes understanding of plan of care.  Vitals:   08/28/23 1422  BP: (!) 230/160  Pulse: (!) 103  Resp: (!) 28  Temp: 97.7 F (36.5 C)  SpO2: 98%

## 2023-08-28 NOTE — Consult Note (Signed)
 Cardiology Consultation   Patient ID: Mark Hayden MRN: [id number]; DOB: [birth year]  Admit date: 08/28/2023 Date of Consult: 08/28/2023  PCP:  Ether Hercules, MD   [facility] Providers Cardiologist:  None        Patient Profile: Mark Hayden is a 48 y.o. male with a hx of uncontrolled HTN, HFpEF (EF = 60-65%), severe LVH, CKD4, OSA and celiac stenosis who is being seen 08/28/2023 for the evaluation of hypertension elevated troponins at the request of Paris Bolds.  History of Present Illness: Mr. Amis reports having chest pain and shortness of breath for the past 3 days.  He states that he has had trouble sleeping due to his inability to lay recumbent due to his respiratory and chest symptoms.  His chest pain is centrally located and sharp in quality.  He rates it a 3-5/10 in intensity.  Additionally he has noted a nonproductive cough over the same period of time.  He has had several admissions in the past for a combination of hypertensive emergency and decompensated CHF.  He states that his current symptoms  feel like when he is in CHF.  He denies peripheral swelling, but feels like his abdomen is more distended than usual.  He denies fevers, chills, sweats, palpitations, urinary changes, N/V/D, syncope or presyncope.  He shared with me that he is not adherent to his medications which per my chart review is a chronic issue for him.  He attributes not taking his medication to his carvedilol  causing GI upset which then makes him not want to take any of his medications.  He denies cost or access being an issue.  Given the persistence of his symptoms, he presented to the [facility] for evaluation.  Of note the patient lives alone.  He denies tobacco or frequent EtOH use.  He does use THC.  No other concerns.  In the ED his VS were afebrile, HR 108, BP 249/149, RR 37, satting 100% on RA.  Labs notable for bicarb 19, creatinine 5.03, BUN 40,  hemoglobin 10.4, BNP 631.6, and troponins 201 -> 213.  CXR showed minimal bibasilar atelectasis without pulmonary edema.  CT head was negative for intracranial process.  ECG shows sinus tachycardia with marked LVH and repolarization abnormality secondary to LVH.  He was given aspirin , amlodipine  10, Lasix  40 IV and IV hydralazine .  Cardiology consulted for evaluation.   Past Medical History:  Diagnosis Date   CHF (congestive heart failure) (HCC)    Hypertension    Hypertensive emergency 08/20/2022   Pneumonia    Sleep apnea    Suicidal thoughts 09/08/2019    History reviewed. No pertinent surgical history.   Home Medications:  Prior to Admission medications   Medication Sig Start Date End Date Taking? Authorizing Provider  amLODipine  (NORVASC ) 10 MG tablet Take 1 tablet (10 mg total) by mouth daily. 06/07/23 09/05/23 Yes Ether Hercules, MD  Blood Pressure Monitor DEVI Use to check blood pressure daily 09/10/22  Yes Vita Grip, MD  carvedilol  (COREG ) 12.5 MG tablet Take 3 tablets (37.5 mg total) by mouth 2 (two) times daily with a meal. 06/07/23 08/28/23 Yes Ether Hercules, MD  dapagliflozin  propanediol (FARXIGA ) 10 MG TABS tablet Take 1 tablet (10 mg total) by mouth daily. 06/07/23  Yes Ether Hercules, MD  hydrALAZINE  (APRESOLINE ) 50 MG tablet Take 1 tablet (50 mg total) by mouth every 12 (twelve) hours. 06/07/23  Yes Ether Hercules, MD  isosorbide  mononitrate (IMDUR ) 60 MG  24 hr tablet Take 1 tablet (60 mg total) by mouth at bedtime. 06/07/23  Yes Ether Hercules, MD  sertraline  (ZOLOFT ) 50 MG tablet Take 1 tablet (50 mg total) by mouth daily. 06/07/23 06/06/24 Yes Ether Hercules, MD  sodium bicarbonate  650 MG tablet Take 1 tablet (650 mg total) by mouth 2 (two) times daily. 06/07/23  Yes Ether Hercules, MD  torsemide  (DEMADEX ) 10 MG tablet Take 1 tablet (10 mg total) by mouth daily as needed (weight gain). 06/07/23  Yes Ether Hercules, MD    Scheduled Meds:  Cecily Cohen ON 08/29/2023] aspirin  EC  81 mg Oral Daily   Continuous Infusions:  PRN Meds:   Allergies:    Allergies  Allergen Reactions   Fish Allergy Anaphylaxis and Itching   Shellfish Allergy Anaphylaxis and Itching    seafood    Social History:   Social History   Socioeconomic History   Marital status: Single    Spouse name: Not on file   Number of children: 1   Years of education: Not on file   Highest education level: High school graduate  Occupational History   Occupation: [employer]  Tobacco Use   Smoking status: Former   Smokeless tobacco: Never  Advertising account planner   Vaping status: Never Used  Substance and Sexual Activity   Alcohol use: No   Drug use: Yes    Types: Marijuana   Sexual activity: Not on file  Other Topics Concern   Not on file  Social History Narrative   Not on file   Social Drivers of Health   Financial Resource Strain: High Risk (09/12/2022)   Overall Financial Resource Strain (CARDIA)    Difficulty of Paying Living Expenses: Very hard  Food Insecurity: No Food Insecurity (01/07/2023)   Hunger Vital Sign    Worried About Running Out of Food in the Last Year: Never true    Ran Out of Food in the Last Year: Never true  Transportation Needs: No Transportation Needs (01/07/2023)   PRAPARE - Administrator, Civil Service (Medical): No    Lack of Transportation (Non-Medical): No  Physical Activity: Not on file  Stress: Not on file  Social Connections: Not on file  Intimate Partner Violence: Not At Risk (01/07/2023)   Humiliation, Afraid, Rape, and Kick questionnaire    Fear of Current or Ex-Partner: No    Emotionally Abused: No    Physically Abused: No    Sexually Abused: No    Family History:    Family History  Problem Relation Age of Onset   Diabetes Mother    Diabetes Father      ROS:  Please see the history of present illness.   All other ROS reviewed and negative.     Physical  Exam/Data: Vitals:   08/28/23 2015 08/28/23 2045 08/28/23 2200 08/28/23 2325  BP: (!) 193/107 (!) 189/104 (!) 172/94   Pulse: 95 98 92   Resp: (!) 33 (!) 34 (!) 22   Temp:    98.5 F (36.9 C)  TempSrc:    Oral  SpO2: 100% 100% 100%   Weight:      Height:        Intake/Output Summary (Last 24 hours) at 08/28/2023 2347 Last data filed at 08/28/2023 2034 Gross per 24 hour  Intake --  Output 400 ml  Net -400 ml      08/28/2023    5:45 PM 06/07/2023    1:17 PM 01/23/2023  3:16 PM  Last 3 Weights  Weight (lbs) 206 lb 206 lb 203 lb 6.4 oz  Weight (kg) 93.441 kg 93.441 kg 92.262 kg     Body mass index is 30.42 kg/m.  General: A bit disheveled appearing in mild respiratory distress HEENT: normal atraumatic, normocephalic Neck: no JVD Vascular: No carotid bruits; radial pulses 2+ bilaterally Cardiac:  normal S1, S2; tachycardic with regular rhythm; no murmur, rub or gallop Lungs:  clear to auscultation bilaterally, no wheezing, rhonchi or rales  Abd: soft, nontender, nondistended Ext: no edema Musculoskeletal:  No deformities, grossly normal Skin: warm and dry  Neuro:  CNs 2-12 intact, no focal abnormalities noted Psych:  Normal affect   EKG:  The EKG was personally reviewed and demonstrates: Sinus tachycardia, LVH, repolarization abnormality secondary to LVH    Telemetry:  Telemetry was personally reviewed and demonstrates: Sinus tachycardia   Relevant CV Studies:  TTE 01/09/23:  IMPRESSIONS     1. Left ventricular ejection fraction, by estimation, is 60 to 65%. The  left ventricle has normal function. The left ventricle has no regional  wall motion abnormalities. There is moderate concentric left ventricular  hypertrophy. Left ventricular  diastolic parameters are consistent with Grade II diastolic dysfunction  (pseudonormalization). The average left ventricular global longitudinal  strain is 9.1 %. GLS appears underestimated.   2. Right ventricular systolic  function is normal. The right ventricular  size is normal. There is mildly elevated pulmonary artery systolic  pressure.   3. Left atrial size was moderately dilated.   4. Right atrial size was mildly dilated.   5. The mitral valve is normal in structure. Mild mitral valve  regurgitation. No evidence of mitral stenosis.   6. The aortic valve is tricuspid. There is mild calcification of the  aortic valve. Aortic valve regurgitation is not visualized. Aortic valve  sclerosis/calcification is present, without any evidence of aortic  stenosis.   7. The inferior vena cava is normal in size with greater than 50%  respiratory variability, suggesting right atrial pressure of 3 mmHg.   Laboratory Data: High Sensitivity Troponin:   Recent Labs  Lab 08/28/23 1655 08/28/23 1918  TROPONINIHS 201* 213*     Chemistry Recent Labs  Lab 08/28/23 1655  NA 143  K 3.5  CL 113*  CO2 19*  GLUCOSE 94  BUN 40*  CREATININE 5.03*  CALCIUM  7.2*  GFRNONAA 14*  ANIONGAP 11    No results for input(s): PROT, ALBUMIN, AST, ALT, ALKPHOS, BILITOT in the last 168 hours. Lipids No results for input(s): CHOL, TRIG, HDL, LABVLDL, LDLCALC, CHOLHDL in the last 168 hours.  Hematology Recent Labs  Lab 08/28/23 1655  WBC 8.3  RBC 3.38*  HGB 10.4*  HCT 33.0*  MCV 97.6  MCH 30.8  MCHC 31.5  RDW 14.2  PLT 190   Thyroid No results for input(s): TSH, FREET4 in the last 168 hours.  BNP Recent Labs  Lab 08/28/23 1655  BNP 631.6*    DDimer No results for input(s): DDIMER in the last 168 hours.  Radiology/Studies:  CT Head Wo Contrast Result Date: 08/28/2023 CLINICAL DATA:  Headache EXAM: CT HEAD WITHOUT CONTRAST TECHNIQUE: Contiguous axial images were obtained from the base of the skull through the vertex without intravenous contrast. RADIATION DOSE REDUCTION: This exam was performed according to the departmental dose-optimization program which includes automated exposure  control, adjustment of the mA and/or kV according to patient size and/or use of iterative reconstruction technique. COMPARISON:  09/07/2019 FINDINGS: Brain:  No acute intracranial abnormality. Specifically, no hemorrhage, hydrocephalus, mass lesion, acute infarction, or significant intracranial injury. Vascular: No hyperdense vessel or unexpected calcification. Skull: No acute calvarial abnormality. Sinuses/Orbits: No acute findings Other: None IMPRESSION: No acute intracranial abnormality. Electronically Signed   By: Janeece Mechanic M.D.   On: 08/28/2023 22:21   DG Chest 2 View Result Date: 08/28/2023 CLINICAL DATA:  Shortness of breath for 3 days. EXAM: CHEST - 2 VIEW COMPARISON:  January 07, 2023. FINDINGS: Stable cardiomediastinal silhouette. Minimal bibasilar subsegmental atelectasis is noted. Bony thorax is unremarkable. IMPRESSION: Minimal bibasilar subsegmental atelectasis. Electronically Signed   By: Rosalene Colon M.D.   On: 08/28/2023 17:29     Assessment and Plan: Kawhi Diebold is a 48 y.o. male with a hx of uncontrolled HTN, HFpEF (EF = 60-65%), severe LVH, CKD4, OSA and celiac stenosis who is being seen 08/28/2023 for the evaluation of hypertension elevated troponins at the request of Paris Bolds.  #Hypertensive Emergency #Elevated Troponins :: Patient presenting with marked hypertension in the setting of medication nonadherence.  He has evidence of endorgan dysfunction including AKI on CKD and elevated troponins.  He was chest pain-free during my entire examination, but continues to have some SOB.  I think that his troponin elevation represents myocardial injury in the setting of hypertensive emergency and not ACS.  His SOB is a bit perplexing to me though.  He does not have pulmonary edema, so I am not sure why he is so SOB.  Will work to get his blood pressure under control and empirically diurese.  If his SOB continues, then can consider VQ scan to rule out PE.  No heparin   indicated at this time. - Initial SBP 249.  Reduce SBP by 20% in the first hour then maintain SBP between 160-180 over the next 12 to 24 hours.  Avoid rapid drops in BP to avoid stroke. - Diurese net negative 1-2L - Restart home amlodipine  10 mg, hydralazine  50 mg twice daily, and Imdur  60 mg daily - Hold home Coreg  37.5 mg twice daily due to reported GI intolerance - Limited antihypertensive options due to renal dysfunction - Complete echo  #HFpEF (EF = 60-65%) #Severe LVH :: Despite his SOB he does not appear to be in decompensated heart failure.  I think his BNP elevation is secondary to his underlying renal dysfunction and his hypertensive emergency.  I will still attempt diuresis given that he is SOB and he has an AKI on CKD which may reflect cardiorenal pathology.  Repeat echo as above. - Diuresing as above - Hold home torsemide  10 mg prn - Complete echo - Candidate for MRA or SGLT2 given renal dysfunction - Pending cardiac MRI as an outpatient  #Celiac Stenosis :: Incidentally noted on prior abdominal ultrasound imaging.  His abdominal exam is benign.  He needs aggressive secondary prevention. - Continue aspirin  - He does not appear to currently be on a statin.  Last LDL >70.  Given his known celiac stenosis I favor tighter LDL control.  Will start atorvastatin 20 mg daily.  #AKI on CKD4 :: Baseline serum creatinine ~4-4.5.  Serum creatinine on admission 5 consistent with either AKI or CKD or progression of his CKD.  Possible cardiorenal so we will diuresis described above.  Risk Assessment/Risk Scores:       [place]  Heart Association (NYHA) Functional Class NYHA Class IV       For questions or updates, please contact [facility] Please consult [email or web address] for contact  info under    Signed, Christena Covert, MD  08/28/2023 11:47 PM

## 2023-08-28 NOTE — ED Notes (Signed)
[facility] notified for transport 

## 2023-08-28 NOTE — ED Triage Notes (Signed)
 Pt c/o SOB x3 days. Hx of CHF and feels like he is full fluid. States has center chest pain from a dry cough. States SOB is better when sitting up. States unsure when the last time he took his meds.

## 2023-08-28 NOTE — ED Notes (Signed)
 MD Aldean Amass and MD [facility] notified and aware of BP 240/143.

## 2023-08-28 NOTE — ED Triage Notes (Signed)
 Pt BIB [facility] from Urgent Care due to CHF exacerbation.  UC did EKG and 20g left hand IV only. Hx CHF and HTN  VS BP 230/160

## 2023-08-28 NOTE — ED Provider Notes (Signed)
 Patient presents to urgent care for evaluation of cough and shortness of breath that started 3 days ago.  History of CHF, states this feels like a CHF exacerbation.  Shortness of breath is worsened by laying flat.  He has not noticed any new or worsening leg swelling.  Denies recent fever, chills, nausea, vomiting, diarrhea, abdominal pain, and urinary symptoms.  History of CKD.  RN states patient became diaphoretic and oxygen saturation was initially 91% on room air after ambulation to the room from the lobby.  On exam, he is tachypneic and appears short of breath. Blood pressure 230/160. Respirations 28.  Speaking in full sentences with minimally increased effort. EKG shows normal sinus rhythm with left ventricular hypertrophy without acute changes when compared to previous EKG from November 2024.  Recommend further workup and evaluation in the emergency room to rule out CHF exacerbation versus pneumonia etc. Recommend transport to the ER via [facility], patient agreeable with plan, discharged from urgent care with [facility] in stable condition.   Starlene Eaton, [place] 08/28/23 [phone]

## 2023-08-28 NOTE — ED Provider Notes (Signed)
 [facility] Provider Note   CSN: [id number] Arrival date & time: 08/28/23  1520     History  Chief Complaint  Patient presents with   Shortness of Breath    Mark Hayden is a 48 y.o. male with past medical history of CKD stage IV, HTN, HFmrEF, OSA presents emergency department for evaluation of chest pain, dry cough. Coughing and hot temperature outside make CP worse. Nonexertional per pt. Took amlodipine  yesterday. Unsure when last torsemide  dose is as he does not do well remembering to take his medications. Denies visual disturbances, HA, fevers  Initially, went to UC as he has been having difficulty with sleeping as he feels shob with laying supine. No O2 at home  Last echo 2024 LVEF 60-65%, grade II diastolic dysfunction, moderate MR, moderate TR  HPI     Home Medications Prior to Admission medications   Medication Sig Start Date End Date Taking? Authorizing Provider  amLODipine  (NORVASC ) 10 MG tablet Take 1 tablet (10 mg total) by mouth daily. 06/07/23 09/05/23 Yes Ether Hercules, MD  Blood Pressure Monitor DEVI Use to check blood pressure daily 09/10/22  Yes Vita Grip, MD  carvedilol  (COREG ) 12.5 MG tablet Take 3 tablets (37.5 mg total) by mouth 2 (two) times daily with a meal. 06/07/23 08/28/23 Yes Ether Hercules, MD  dapagliflozin  propanediol (FARXIGA ) 10 MG TABS tablet Take 1 tablet (10 mg total) by mouth daily. 06/07/23  Yes Ether Hercules, MD  hydrALAZINE  (APRESOLINE ) 50 MG tablet Take 1 tablet (50 mg total) by mouth every 12 (twelve) hours. 06/07/23  Yes Ether Hercules, MD  isosorbide  mononitrate (IMDUR ) 60 MG 24 hr tablet Take 1 tablet (60 mg total) by mouth at bedtime. 06/07/23  Yes Ether Hercules, MD  sertraline  (ZOLOFT ) 50 MG tablet Take 1 tablet (50 mg total) by mouth daily. 06/07/23 06/06/24 Yes Ether Hercules, MD  sodium bicarbonate  650 MG tablet Take 1 tablet  (650 mg total) by mouth 2 (two) times daily. 06/07/23  Yes Ether Hercules, MD  torsemide  (DEMADEX ) 10 MG tablet Take 1 tablet (10 mg total) by mouth daily as needed (weight gain). 06/07/23  Yes Ether Hercules, MD      Allergies    Fish allergy and Shellfish allergy    Review of Systems   Review of Systems  Constitutional:  Negative for chills, fatigue and fever.  Respiratory:  Negative for cough, chest tightness, shortness of breath and wheezing.   Cardiovascular:  Negative for chest pain and palpitations.  Gastrointestinal:  Negative for abdominal pain, constipation, diarrhea, nausea and vomiting.  Neurological:  Negative for dizziness, seizures, weakness, light-headedness, numbness and headaches.    Physical Exam Updated Vital Signs BP (!) 172/94   Pulse 92   Temp 98.5 F (36.9 C) (Oral)   Resp (!) 22   Ht 5' 9 (1.753 m)   Wt 93.4 kg   SpO2 100%   BMI 30.42 kg/m  Physical Exam Vitals and nursing note reviewed.  Constitutional:      General: He is not in acute distress.    Appearance: Normal appearance.  HENT:     Head: Normocephalic and atraumatic.  Eyes:     Conjunctiva/sclera: Conjunctivae normal.  Cardiovascular:     Rate and Rhythm: Tachycardia present.     Comments: Initially tachycardic at 107 bpm decreased to 98 bpm Pulmonary:     Effort: Tachypnea present. No respiratory distress.  Breath sounds: Normal breath sounds.  Musculoskeletal:     Right lower leg: No edema.     Left lower leg: No edema.     Comments: No calf swelling nor tenderness  Skin:    Capillary Refill: Capillary refill takes less than 2 seconds.     Coloration: Skin is not jaundiced or pale.  Neurological:     Mental Status: He is alert and oriented to person, place, and time. Mental status is at baseline.     ED Results / Procedures / Treatments   Labs (all labs ordered are listed, but only abnormal results are displayed) Labs Reviewed  BASIC METABOLIC PANEL WITH  GFR - Abnormal; Notable for the following components:      Result Value   Chloride 113 (*)    CO2 19 (*)    BUN 40 (*)    Creatinine, Ser 5.03 (*)    Calcium  7.2 (*)    GFR, Estimated 14 (*)    All other components within normal limits  CBC - Abnormal; Notable for the following components:   RBC 3.38 (*)    Hemoglobin 10.4 (*)    HCT 33.0 (*)    All other components within normal limits  BRAIN NATRIURETIC PEPTIDE - Abnormal; Notable for the following components:   B Natriuretic Peptide 631.6 (*)    All other components within normal limits  TROPONIN I (HIGH SENSITIVITY) - Abnormal; Notable for the following components:   Troponin I (High Sensitivity) 201 (*)    All other components within normal limits  TROPONIN I (HIGH SENSITIVITY) - Abnormal; Notable for the following components:   Troponin I (High Sensitivity) 213 (*)    All other components within normal limits    EKG EKG Interpretation Date/Time:  Wednesday August 28 2023 15:23:24 EDT Ventricular Rate:  120 PR Interval:  144 QRS Duration:  93 QT Interval:  347 QTC Calculation: 491 R Axis:   59  Text Interpretation: Sinus tachycardia Probable left atrial enlargement LVH with secondary repolarization abnormality ST depr, consider ischemia, inferior leads Anterior ST elevation, probably due to LVH Borderline prolonged QT interval since last tracing no significant change Confirmed by Hershel Los [phone]) on 08/28/2023 7:06:32 PM  Radiology CT Head Wo Contrast Result Date: 08/28/2023 CLINICAL DATA:  Headache EXAM: CT HEAD WITHOUT CONTRAST TECHNIQUE: Contiguous axial images were obtained from the base of the skull through the vertex without intravenous contrast. RADIATION DOSE REDUCTION: This exam was performed according to the departmental dose-optimization program which includes automated exposure control, adjustment of the mA and/or kV according to patient size and/or use of iterative reconstruction technique. COMPARISON:   09/07/2019 FINDINGS: Brain: No acute intracranial abnormality. Specifically, no hemorrhage, hydrocephalus, mass lesion, acute infarction, or significant intracranial injury. Vascular: No hyperdense vessel or unexpected calcification. Skull: No acute calvarial abnormality. Sinuses/Orbits: No acute findings Other: None IMPRESSION: No acute intracranial abnormality. Electronically Signed   By: Janeece Mechanic M.D.   On: 08/28/2023 22:21   DG Chest 2 View Result Date: 08/28/2023 CLINICAL DATA:  Shortness of breath for 3 days. EXAM: CHEST - 2 VIEW COMPARISON:  January 07, 2023. FINDINGS: Stable cardiomediastinal silhouette. Minimal bibasilar subsegmental atelectasis is noted. Bony thorax is unremarkable. IMPRESSION: Minimal bibasilar subsegmental atelectasis. Electronically Signed   By: Rosalene Colon M.D.   On: 08/28/2023 17:29    Procedures Procedures    Medications Ordered in ED Medications  aspirin  tablet 325 mg (325 mg Oral Given 08/28/23 1912)  amLODipine  (NORVASC ) tablet 10  mg (10 mg Oral Given 08/28/23 1700)  hydrALAZINE  (APRESOLINE ) injection 10 mg (10 mg Intravenous Given 08/28/23 1755)  hydrALAZINE  (APRESOLINE ) injection 10 mg (10 mg Intravenous Given 08/28/23 1915)  furosemide  (LASIX ) injection 40 mg (40 mg Intravenous Given 08/28/23 1915)    ED Course/ Medical Decision Making/ A&P                                 Medical Decision Making Amount and/or Complexity of Data Reviewed Labs: ordered. Radiology: ordered.  Risk OTC drugs. Prescription drug management. Decision regarding hospitalization.   Patient presents to the ED for concern of CP, SHOB, this involves an extensive number of treatment options, and is a complaint that carries with it a high risk of complications and morbidity.  The differential diagnosis includes ACS, pneumonia, fluid overload, electrolyte abnormality, MSK, hypertensive crisis   Co morbidities that complicate the patient evaluation  Noncompliance with  medications Previous admission for hypertensive crisis   Additional history obtained:  Additional history obtained from Nursing   External records from outside source obtained and reviewed including triage RN note, ED note from 01/07/2023 for admission for hypertensive crisis   Lab Tests:  I Ordered, and personally interpreted labs.  The pertinent results include:   For troponin 201, second troponin 213 BNP 631 Hgb 10.4 (baseline 9.9-11.7 over past 7 months) BUN 40 (baseline 34-61 over past 7 months) Creatinine 5.03 (baseline 4.03-4.58 over past 7 months)   Imaging Studies ordered:  I ordered imaging studies including CXR  I independently visualized and interpreted imaging which showed bilateral I agree with the radiologist interpretation   Cardiac Monitoring:  The patient was maintained on a cardiac monitor.  I personally viewed and interpreted the cardiac monitored which showed an underlying rhythm of: Sinus tachycardia with inverted T waves in leads I, II AVF, V5, and V6 similar to previous EKG   Medicines ordered and prescription drug management:  I ordered medication including hydralazine , ASA, amlodipine   for HTN  Reevaluation of the patient after these medicines showed that the patient improved I have reviewed the patients home medicines and have made adjustments as needed    Critical Interventions:  HTN emergency. 249 SBP    Consultations Obtained:  I requested consultation with cardiology Dr. Vira Grieves,  and discussed lab and imaging findings as well as pertinent plan - they recommend:  Hospitalist admission for BP control Believe elevated troponin is 2/2 demaind ischemia and HTN so does not believe heparin  is necessary at this time I requested consultation with hospitalist Dr. Jacklynn Mask, and discussed lab and imaging findings as well as pertinent plan-she accepts patient for admission   Problem List / ED Course:  CP SHOB Malignant HTN Medication  noncompliance Overall well appearing Initially, patient denied CP then stated it was worse with coughing.  States SHOB when supine. Does not appear to be in resp distress. CXR neg for significant fluid but will provide lasix  for elevated BNP, history, and noncompliance with his diuretic at home. On exam, he does not appear in distress or significantly overloaded with no pedal edema. Provided ASA Initially SBP 250 and decreased to 172 following amlodipine , hydralazine  10mg  x2, lasix  Cardiology consulted as noted above. Will admit to medicine for BP control, HTN crisis, elevated troponin HA While cardiology was consulting, patient was complaining of a significant HA Obtained CT to ensure no ICH which was fortunately negative   Reevaluation:  After the  interventions noted above, I reevaluated the patient and found that they have :improved     Dispostion:  After consideration of the diagnostic results and the patients response to treatment, I feel that the patent would benefit from admission for HTN emergency.   Discussed ED workup, plan with patient who expresses understanding and agrees with plan  Final Clinical Impression(s) / ED Diagnoses Final diagnoses:  Hypertensive emergency  SOB (shortness of breath)    Rx / DC Orders ED Discharge Orders     None         Royann Cords, PA 08/28/23 2313    Hershel Los, MD 08/28/23 2321

## 2023-08-28 NOTE — ED Notes (Signed)
 Called [facility]

## 2023-08-28 NOTE — H&P (Signed)
 History and Physical    Modesto Ganoe GMW:[id number] DOB: 1975-07-27 DOA: 08/28/2023  PCP: Ether Hercules, MD   Patient coming from: Home   Chief Complaint: Orthopnea, non-productive cough   HPI: Mark Hayden is a pleasant 48 y.o. male with medical history significant for hypertension, sleep apnea, CKD stage IV, and chronic HFpEF who presents with orthopnea and nonproductive cough.   Patient has not been taking his medications as prescribed and has developed orthopnea and nonproductive cough over the past 3 days.  He has not noticed much leg swelling but feels that his abdomen is becoming distended.  He has been experiencing a sharp pain localized to his central chest that he attributes to his coughing.  He denies fevers, chills, abdominal pain, or dysuria.   ED Course: Upon arrival to the ED, patient is found to be afebrile and saturating well on room air with tachypnea, mild tachycardia, and severely elevated blood pressure.  EKG demonstrates sinus rhythm with LVH and repolarization abnormality.  Head CT is negative for acute findings and chest x-ray notable for basilar atelectasis.  Labs are most notable for BUN 40, creatinine 5.03, troponin 201, and BNP 632.  Cardiology was consulted by the ED physician and the patient was given IV Lasix , IV hydralazine  x 2, Norvasc , and aspirin .  Review of Systems:  All other systems reviewed and apart from HPI, are negative.  Past Medical History:  Diagnosis Date   CHF (congestive heart failure) (HCC)    Hypertension    Hypertensive emergency 08/20/2022   Pneumonia    Sleep apnea    Suicidal thoughts 09/08/2019    History reviewed. No pertinent surgical history.  Social History:   reports that he has quit smoking. He has never used smokeless tobacco. He reports current drug use. Drug: Marijuana. He reports that he does not drink alcohol.  Allergies  Allergen Reactions   Fish Allergy Anaphylaxis and Itching    Shellfish Allergy Anaphylaxis and Itching    seafood    Family History  Problem Relation Age of Onset   Diabetes Mother    Diabetes Father      Prior to Admission medications   Medication Sig Start Date End Date Taking? Authorizing Provider  amLODipine  (NORVASC ) 10 MG tablet Take 1 tablet (10 mg total) by mouth daily. 06/07/23 09/05/23 Yes Ether Hercules, MD  Blood Pressure Monitor DEVI Use to check blood pressure daily 09/10/22  Yes Vita Grip, MD  carvedilol  (COREG ) 12.5 MG tablet Take 3 tablets (37.5 mg total) by mouth 2 (two) times daily with a meal. 06/07/23 08/28/23 Yes Ether Hercules, MD  dapagliflozin  propanediol (FARXIGA ) 10 MG TABS tablet Take 1 tablet (10 mg total) by mouth daily. 06/07/23  Yes Ether Hercules, MD  hydrALAZINE  (APRESOLINE ) 50 MG tablet Take 1 tablet (50 mg total) by mouth every 12 (twelve) hours. 06/07/23  Yes Ether Hercules, MD  isosorbide  mononitrate (IMDUR ) 60 MG 24 hr tablet Take 1 tablet (60 mg total) by mouth at bedtime. 06/07/23  Yes Ether Hercules, MD  sertraline  (ZOLOFT ) 50 MG tablet Take 1 tablet (50 mg total) by mouth daily. 06/07/23 06/06/24 Yes Ether Hercules, MD  sodium bicarbonate  650 MG tablet Take 1 tablet (650 mg total) by mouth 2 (two) times daily. 06/07/23  Yes Ether Hercules, MD  torsemide  (DEMADEX ) 10 MG tablet Take 1 tablet (10 mg total) by mouth daily as needed (weight gain). 06/07/23  Yes Ether Hercules, MD  Physical Exam: Vitals:   08/28/23 2045 08/28/23 2200 08/28/23 2325 08/29/23 0029  BP: (!) 189/104 (!) 172/94  (!) 192/102  Pulse: 98 92    Resp: (!) 34 (!) 22    Temp:   98.5 F (36.9 C)   TempSrc:   Oral   SpO2: 100% 100%    Weight:      Height:        Constitutional: NAD, no pallor or diaphoresis   Eyes: PERTLA, lids and conjunctivae normal ENMT: Mucous membranes are moist. Posterior pharynx clear of any exudate or lesions.   Neck: supple, no masses   Respiratory: Mild tachypnea. No wheezing. No accessory muscle use.  Cardiovascular: S1 & S2 heard, regular rate and rhythm. No extremity edema.  Abdomen: No tenderness, soft. Bowel sounds active.  Musculoskeletal: no clubbing / cyanosis. No joint deformity upper and lower extremities.   Skin: no significant rashes, lesions, ulcers. Warm, dry, well-perfused. Neurologic: CN 2-12 grossly intact. Moving all extremities. Alert and oriented.  Psychiatric: Odd affect. Calm. Cooperative.    Labs and Imaging on Admission: I have personally reviewed following labs and imaging studies  CBC: Recent Labs  Lab 08/28/23 1655  WBC 8.3  HGB 10.4*  HCT 33.0*  MCV 97.6  PLT 190   Basic Metabolic Panel: Recent Labs  Lab 08/28/23 1655  NA 143  K 3.5  CL 113*  CO2 19*  GLUCOSE 94  BUN 40*  CREATININE 5.03*  CALCIUM  7.2*   GFR: Estimated Creatinine Clearance: 20.7 mL/min (A) (by C-G formula based on SCr of 5.03 mg/dL (H)). Liver Function Tests: No results for input(s): AST, ALT, ALKPHOS, BILITOT, PROT, ALBUMIN in the last 168 hours. No results for input(s): LIPASE, AMYLASE in the last 168 hours. No results for input(s): AMMONIA in the last 168 hours. Coagulation Profile: No results for input(s): INR, PROTIME in the last 168 hours. Cardiac Enzymes: No results for input(s): CKTOTAL, CKMB, CKMBINDEX, TROPONINI in the last 168 hours. BNP (last 3 results) No results for input(s): PROBNP in the last 8760 hours. HbA1C: No results for input(s): HGBA1C in the last 72 hours. CBG: No results for input(s): GLUCAP in the last 168 hours. Lipid Profile: No results for input(s): CHOL, HDL, LDLCALC, TRIG, CHOLHDL, LDLDIRECT in the last 72 hours. Thyroid Function Tests: No results for input(s): TSH, T4TOTAL, FREET4, T3FREE, THYROIDAB in the last 72 hours. Anemia Panel: No results for input(s): VITAMINB12, FOLATE, FERRITIN, TIBC,  IRON , RETICCTPCT in the last 72 hours. Urine analysis:    Component Value Date/Time   COLORURINE YELLOW 01/07/2023 1530   APPEARANCEUR CLEAR 01/07/2023 1530   LABSPEC 1.017 01/07/2023 1530   PHURINE 5.0 01/07/2023 1530   GLUCOSEU 50 (A) 01/07/2023 1530   HGBUR MODERATE (A) 01/07/2023 1530   BILIRUBINUR NEGATIVE 01/07/2023 1530   KETONESUR NEGATIVE 01/07/2023 1530   PROTEINUR 100 (A) 01/07/2023 1530   NITRITE NEGATIVE 01/07/2023 1530   LEUKOCYTESUR NEGATIVE 01/07/2023 1530   Sepsis Labs: @LABRCNTIP (procalcitonin:4,lacticidven:4) )No results found for this or any previous visit (from the past 240 hours).   Radiological Exams on Admission: CT Head Wo Contrast Result Date: 08/28/2023 CLINICAL DATA:  Headache EXAM: CT HEAD WITHOUT CONTRAST TECHNIQUE: Contiguous axial images were obtained from the base of the skull through the vertex without intravenous contrast. RADIATION DOSE REDUCTION: This exam was performed according to the departmental dose-optimization program which includes automated exposure control, adjustment of the mA and/or kV according to patient size and/or use of iterative reconstruction technique. COMPARISON:  09/07/2019  FINDINGS: Brain: No acute intracranial abnormality. Specifically, no hemorrhage, hydrocephalus, mass lesion, acute infarction, or significant intracranial injury. Vascular: No hyperdense vessel or unexpected calcification. Skull: No acute calvarial abnormality. Sinuses/Orbits: No acute findings Other: None IMPRESSION: No acute intracranial abnormality. Electronically Signed   By: Janeece Mechanic M.D.   On: 08/28/2023 22:21   DG Chest 2 View Result Date: 08/28/2023 CLINICAL DATA:  Shortness of breath for 3 days. EXAM: CHEST - 2 VIEW COMPARISON:  January 07, 2023. FINDINGS: Stable cardiomediastinal silhouette. Minimal bibasilar subsegmental atelectasis is noted. Bony thorax is unremarkable. IMPRESSION: Minimal bibasilar subsegmental atelectasis. Electronically  Signed   By: Rosalene Colon M.D.   On: 08/28/2023 17:29    EKG: Independently reviewed. Sinus rhythm, LVH with repolarization abnormality.   Assessment/Plan   1. Hypertensive crisis  - Continue diuresis, continue Norvasc , Coreg , and hydralazine , start nitroglycerin  infusion for goal MAP of 115 tonight    2. Acute on chronic HFpEF  - Continue diuresis with IV Lasix , update echocardiogram, monitor weight and I/Os, continue Coreg , hold Farxiga  given increased creatinine    3. CKD IV  - SCr is 5.0 on admission, baseline may be closer to 4.5  - Hold Farxiga , renally-dose medications, continue bicarbonate, monitor closely while diuresing   4. Elevated troponin  - Troponin is flat, presentation not suggestive of ACS  - Control BP, treat CHF, follow-up on echo findings     DVT prophylaxis: sq heparin   Code Status: Full  Level of Care: Level of care: Progressive Family Communication: None present  Disposition Plan:  Patient is from: home  Anticipated d/c is to: Home  Anticipated d/c date is: 08/31/23  Patient currently: Pending BP-control, improved volume status, stable renal function  Consults called: Cardiology  Admission status: Inpatient     Walton Guppy, MD [facility]  08/29/2023, 12:33 AM

## 2023-08-28 NOTE — ED Notes (Signed)
 Patient transported to CT

## 2023-08-29 ENCOUNTER — Inpatient Hospital Stay (HOSPITAL_COMMUNITY)

## 2023-08-29 DIAGNOSIS — I161 Hypertensive emergency: Secondary | ICD-10-CM

## 2023-08-29 DIAGNOSIS — I169 Hypertensive crisis, unspecified: Secondary | ICD-10-CM | POA: Diagnosis not present

## 2023-08-29 DIAGNOSIS — R7989 Other specified abnormal findings of blood chemistry: Secondary | ICD-10-CM

## 2023-08-29 LAB — BASIC METABOLIC PANEL WITH GFR
Anion gap: 12 (ref 5–15)
BUN: 40 mg/dL — ABNORMAL HIGH (ref 6–20)
CO2: 19 mmol/L — ABNORMAL LOW (ref 22–32)
Calcium: 7 mg/dL — ABNORMAL LOW (ref 8.9–10.3)
Chloride: 108 mmol/L (ref 98–111)
Creatinine, Ser: 4.9 mg/dL — ABNORMAL HIGH (ref 0.61–1.24)
GFR, Estimated: 14 mL/min — ABNORMAL LOW (ref >60)
Glucose, Bld: 110 mg/dL — ABNORMAL HIGH (ref 70–99)
Potassium: 3.2 mmol/L — ABNORMAL LOW (ref 3.5–5.1)
Sodium: 139 mmol/L (ref 135–145)

## 2023-08-29 LAB — CBC
HCT: 29.7 % — ABNORMAL LOW (ref 39.0–52.0)
Hemoglobin: 9.8 g/dL — ABNORMAL LOW (ref 13.0–17.0)
MCH: 30.9 pg (ref 26.0–34.0)
MCHC: 33 g/dL (ref 30.0–36.0)
MCV: 93.7 fL (ref 80.0–100.0)
Platelets: 183 10*3/uL (ref 150–400)
RBC: 3.17 MIL/uL — ABNORMAL LOW (ref 4.22–5.81)
RDW: 14.1 % (ref 11.5–15.5)
WBC: 7.2 10*3/uL (ref 4.0–10.5)
nRBC: 0 % (ref 0.0–0.2)

## 2023-08-29 LAB — HEPATIC FUNCTION PANEL
ALT: 15 U/L (ref 0–44)
AST: 20 U/L (ref 15–41)
Albumin: 3.6 g/dL (ref 3.5–5.0)
Alkaline Phosphatase: 78 U/L (ref 38–126)
Bilirubin, Direct: 0.2 mg/dL (ref 0.0–0.2)
Indirect Bilirubin: 0.5 mg/dL (ref 0.3–0.9)
Total Bilirubin: 0.7 mg/dL (ref 0.0–1.2)
Total Protein: 6.6 g/dL (ref 6.5–8.1)

## 2023-08-29 LAB — ECHOCARDIOGRAM COMPLETE
Area-P 1/2: 4.44 cm2
Height: 69 in
S' Lateral: 3.3 cm
Weight: 3296 [oz_av]

## 2023-08-29 LAB — BLOOD GAS, VENOUS
Acid-base deficit: 2.8 mmol/L — ABNORMAL HIGH (ref 0.0–2.0)
Bicarbonate: 21.9 mmol/L (ref 20.0–28.0)
O2 Saturation: 98.7 %
Patient temperature: 36.6
pCO2, Ven: 36 mmHg — ABNORMAL LOW (ref 44–60)
pH, Ven: 7.39 (ref 7.25–7.43)
pO2, Ven: 83 mmHg — ABNORMAL HIGH (ref 32–45)

## 2023-08-29 LAB — HIV ANTIBODY (ROUTINE TESTING W REFLEX): HIV Screen 4th Generation wRfx: NONREACTIVE

## 2023-08-29 MED ORDER — FUROSEMIDE 10 MG/ML IJ SOLN
60.0000 mg | Freq: Once | INTRAMUSCULAR | Status: AC
  Administered 2023-08-29: 60 mg via INTRAVENOUS
  Filled 2023-08-29: qty 6

## 2023-08-29 MED ORDER — ATORVASTATIN CALCIUM 10 MG PO TABS
20.0000 mg | ORAL_TABLET | Freq: Every day | ORAL | Status: DC
  Administered 2023-08-29 – 2023-09-02 (×5): 20 mg via ORAL
  Filled 2023-08-29 (×5): qty 2

## 2023-08-29 NOTE — ED Notes (Signed)
 Cards at bedside

## 2023-08-29 NOTE — Progress Notes (Signed)
 Heart Failure Navigator Progress Note  Assessed for Heart & Vascular TOC clinic readiness.  Patient does not meet criteria due to Advanced Heart Failure Team patient of Dr. Gasper Lloyd.   Navigator will sign off at this time.   Rhae Hammock, BSN, Scientist, clinical (histocompatibility and immunogenetics) Only

## 2023-08-29 NOTE — Progress Notes (Addendum)
 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 He reports that he is finally able to sleep, which makes him happy  Echo tech was entering as I was leaving to perform updated echo  Vital Signs Vitals:   08/29/23 0915 08/29/23 0922 08/29/23 1100 08/29/23 1115  BP: (!) 181/107  (!) 194/119 (!) 200/118  Pulse: 85  92 96  Resp: (!) 25  (!) 29 (!) 34  Temp:  (!) 97.5 F (36.4 C)    TempSrc:  Oral    SpO2: 100%  97% 99%  Weight:      Height:        Intake/Output Summary (Last 24 hours) at 08/29/2023 1124 Last data filed at 08/29/2023 0426 Gross per 24 hour  Intake 14.14 ml  Output 400 ml  Net -385.86 ml      08/28/2023    5:45 PM 06/07/2023    1:17 PM 01/23/2023    3:16 PM  Last 3 Weights  Weight (lbs) 206 lb 206 lb 203 lb 6.4 oz  Weight (kg) 93.441 kg 93.441 kg 92.262 kg     Telemetry/ECG  Sinus rhythm, HR 80-100s - Personally Reviewed  Physical Exam  GEN: No acute distress.   Neck: No JVD Cardiac: RRR, no murmurs, rubs, or gallops.  Respiratory: Clear to auscultation bilaterally. GI: Soft, nontender, non-distended  MS: No edema  Assessment & Plan  Digby Groeneveld is a 48 y.o. male with a hx of uncontrolled HTN, HFpEF (EF = 60-65%), severe LVH, CKD4, OSA and celiac stenosis who is being seen for hypertension, elevated troponins   Hypertensive emergency Elevated troponin levels Presented with BP as high as 249/149 Reports chest pain, shortness of breath x 3 days Reports noncompliance with medication Reports GI upset with CoReg  Most recent BP 181/107, reducing blood pressure conservatively  Options for antihypertensives limited  due to renal dysfunction Troponin levels 201 > 213 Currently symptom free  Currently on IV nitroglycerin  at 20 mcg/min, amlodipine  10 mg daily, hydralazine  50 mg BID, Imdur  60 mg daily, IV Lasix  40 mg BID Pending updated echocardiogram  Chronic HFpEF Severe LVH Presented with shortness of breath BNP 631, in the setting of CKD stage IV Home meds: Hydralazine  50 mg TID + Imdur  60 mg daily, Farxiga  10 mg daily, Coreg  37.5 mg BID, torsemide  20 mg daily + K supplementation Echo from 12/2022: EF 60 to 65%, moderate LVH, G2 DD, normal RV function, mildly elevated PASP, biatrial enlargement, mild MR, normal IVC Pending updated echocardiogram Currently receiving IV diuresis Medications as above, can adjust pending updated echo  Patient was set to undergo cardiac MRI as an outpatient, to rule out infiltrative disease, might be able to obtain this while admitted   Per primary 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  Agree as above, with the following exceptions and changes as noted below. Remains on nitroglycerin  influsion with better BP control but still elevated . Gen: NAD, CV: RRR, no murmurs, Lungs: clear, Abd: soft,  Extrem: Warm, well perfused, no edema, Neuro/Psych: alert and oriented x 3, normal mood and affect. All available labs, radiology testing, previous records reviewed. Echocardiogram with preserved EF and no definite regional wall motion abnormalities. Troponin elevation likely from HTN emergency. Continue therapy as above. Will consider obtaining MRI while inpatient to ensure LVH is properly characterized.  Taylin Leder A Makeba Delcastillo, MD 08/29/23 7:36 PM

## 2023-08-29 NOTE — Progress Notes (Signed)
 PROGRESS NOTE    Mark Hayden  ZOX:[id number] DOB: [birth year] DOA: 08/28/2023 PCP: Ether Hercules, MD   Brief Narrative:  Mark Hayden is a pleasant 48 y.o. male with medical history significant for hypertension, sleep apnea, CKD stage IV, and chronic HFpEF who presents with orthopnea and nonproductive cough - in the ED patient found to be volume overloaded/orthopnea with profound hypertension.  Hospitalist called for admission.  Assessment & Plan:   Principal Problem:   Hypertensive crisis Active Problems:   CKD (chronic kidney disease) stage 4, GFR 15-29 ml/min (HCC)   Acute on chronic heart failure with preserved ejection fraction (HFpEF) (HCC)   Elevated troponin  Hypertensive emergency Elevated troponin, ACS ruled out Medication nonadherence - Troponin likely elevated in the setting of hypertensive emergency with endorgan damage, not suggestive of ACS - Continue aggressive diuresis as renal function will tolerate - Continue amlodipine , carvedilol , isosorbide , hydralazine , nitroglycerin  infusion - MAP remains elevated but downtrending appropriately -continue to wean nitro drip off as p.o. medications take effect -Patient reports profound diaphoresis and chest palpitations after taking carvedilol , will follow closely.  May be having symptomatic hypotension -UDS pending -history of THC use  Acute on chronic HFpEF  - Continue diuresis with IV Lasix , update echocardiogram pending, monitor weight and I/Os, continue core measures as above including carvedilol , Farxiga    CKD IV Metabolic acidosis - SCr is 5.0 on admission, baseline closer to 4.5 per chart review -Creatinine downtrending somewhat with improved diuresis and blood pressure control - Hold Farxiga , continue bicarb    DVT prophylaxis: heparin  injection 5,000 Units Start: 08/29/23 1400 Code Status:   Code Status: Full Code Family Communication: None present  Status is: Inpatient  Dispo:  The patient is from: Home              Anticipated d/c is to: Home              Anticipated d/c date is: 24 to 48 hours              Patient currently not medically stable for discharge  Consultants:  Cardiology  Procedures:  None  Antimicrobials:  None  Subjective: No acute issues or events overnight, headache improving otherwise denies nausea vomiting diarrhea constipation headache fevers chills chest pain  Objective: Vitals:   08/29/23 0415 08/29/23 0515 08/29/23 0615 08/29/23 0645  BP: (!) 169/98 (!) 176/94 (!) 174/98   Pulse: 82 83 83   Resp: (!) 30 (!) 28 (!) 28   Temp:    97.8 F (36.6 C)  TempSrc:    Oral  SpO2: 100% 100% 100%   Weight:      Height:        Intake/Output Summary (Last 24 hours) at 08/29/2023 0711 Last data filed at 08/29/2023 0426 Gross per 24 hour  Intake 14.14 ml  Output 400 ml  Net -385.86 ml   Filed Weights   08/28/23 1745  Weight: 93.4 kg    Examination:  General:  Pleasantly resting in bed, No acute distress. HEENT:  Normocephalic atraumatic.  Sclerae nonicteric, noninjected.  Extraocular movements intact bilaterally. Neck:  Without mass or deformity.  Trachea is midline. Lungs:  Clear to auscultate bilaterally without rhonchi, wheeze, or rales. Heart:  Regular rate and rhythm.  Without murmurs, rubs, or gallops. Abdomen:  Soft, nontender, nondistended.  Without guarding or rebound. Extremities: Without cyanosis, clubbing, edema, or obvious deformity. Skin:  Warm and dry, no erythema.   Data Reviewed: I have personally reviewed following labs and  imaging studies  CBC: Recent Labs  Lab 08/28/23 1655 08/29/23 0448  WBC 8.3 7.2  HGB 10.4* 9.8*  HCT 33.0* 29.7*  MCV 97.6 93.7  PLT 190 183   Basic Metabolic Panel: Recent Labs  Lab 08/28/23 1655 08/29/23 0448  NA 143 139  K 3.5 3.2*  CL 113* 108  CO2 19* 19*  GLUCOSE 94 110*  BUN 40* 40*  CREATININE 5.03* 4.90*  CALCIUM  7.2* 7.0*   GFR: Estimated Creatinine  Clearance: 21.3 mL/min (A) (by C-G formula based on SCr of 4.9 mg/dL (H)). Liver Function Tests: Recent Labs  Lab 08/29/23 0448  AST 20  ALT 15  ALKPHOS 78  BILITOT 0.7  PROT 6.6  ALBUMIN 3.6    No results found for this or any previous visit (from the past 240 hours).   Radiology Studies: CT Head Wo Contrast Result Date: 08/28/2023 CLINICAL DATA:  Headache EXAM: CT HEAD WITHOUT CONTRAST TECHNIQUE: Contiguous axial images were obtained from the base of the skull through the vertex without intravenous contrast. RADIATION DOSE REDUCTION: This exam was performed according to the departmental dose-optimization program which includes automated exposure control, adjustment of the mA and/or kV according to patient size and/or use of iterative reconstruction technique. COMPARISON:  09/07/2019 FINDINGS: Brain: No acute intracranial abnormality. Specifically, no hemorrhage, hydrocephalus, mass lesion, acute infarction, or significant intracranial injury. Vascular: No hyperdense vessel or unexpected calcification. Skull: No acute calvarial abnormality. Sinuses/Orbits: No acute findings Other: None IMPRESSION: No acute intracranial abnormality. Electronically Signed   By: Janeece Mechanic M.D.   On: 08/28/2023 22:21   DG Chest 2 View Result Date: 08/28/2023 CLINICAL DATA:  Shortness of breath for 3 days. EXAM: CHEST - 2 VIEW COMPARISON:  January 07, 2023. FINDINGS: Stable cardiomediastinal silhouette. Minimal bibasilar subsegmental atelectasis is noted. Bony thorax is unremarkable. IMPRESSION: Minimal bibasilar subsegmental atelectasis. Electronically Signed   By: Rosalene Colon M.D.   On: 08/28/2023 17:29   Scheduled Meds:  amLODipine   10 mg Oral Daily   aspirin  EC  81 mg Oral Daily   atorvastatin  20 mg Oral Daily   furosemide   40 mg Intravenous BID   heparin   5,000 Units Subcutaneous Q8H   hydrALAZINE   50 mg Oral Q12H   isosorbide  mononitrate  60 mg Oral Daily   sertraline   50 mg Oral Daily    sodium bicarbonate   650 mg Oral BID   sodium chloride  flush  3 mL Intravenous Q12H   Continuous Infusions:  nitroGLYCERIN  20 mcg/min (08/29/23 0426)     LOS: 1 day   Time spent:  Haydee Lipa, DO [facility]  If 7PM-7AM, please contact night-coverage [email or web address]  08/29/2023, 7:11 AM

## 2023-08-29 NOTE — Progress Notes (Signed)
 Per Teofilo Fellers MD. Patient BP goal should remain at this tim,e no less than 160/100.

## 2023-08-30 DIAGNOSIS — I169 Hypertensive crisis, unspecified: Secondary | ICD-10-CM | POA: Diagnosis not present

## 2023-08-30 LAB — BASIC METABOLIC PANEL WITH GFR
Anion gap: 10 (ref 5–15)
BUN: 41 mg/dL — ABNORMAL HIGH (ref 6–20)
CO2: 23 mmol/L (ref 22–32)
Calcium: 6.6 mg/dL — ABNORMAL LOW (ref 8.9–10.3)
Chloride: 105 mmol/L (ref 98–111)
Creatinine, Ser: 5.13 mg/dL — ABNORMAL HIGH (ref 0.61–1.24)
GFR, Estimated: 13 mL/min — ABNORMAL LOW (ref >60)
Glucose, Bld: 93 mg/dL (ref 70–99)
Potassium: 3 mmol/L — ABNORMAL LOW (ref 3.5–5.1)
Sodium: 138 mmol/L (ref 135–145)

## 2023-08-30 LAB — CBC
HCT: 28 % — ABNORMAL LOW (ref 39.0–52.0)
Hemoglobin: 9.3 g/dL — ABNORMAL LOW (ref 13.0–17.0)
MCH: 30.3 pg (ref 26.0–34.0)
MCHC: 33.2 g/dL (ref 30.0–36.0)
MCV: 91.2 fL (ref 80.0–100.0)
Platelets: 173 10*3/uL (ref 150–400)
RBC: 3.07 MIL/uL — ABNORMAL LOW (ref 4.22–5.81)
RDW: 13.6 % (ref 11.5–15.5)
WBC: 7.2 10*3/uL (ref 4.0–10.5)
nRBC: 0 % (ref 0.0–0.2)

## 2023-08-30 MED ORDER — POLYETHYLENE GLYCOL 3350 17 G PO PACK
17.0000 g | PACK | Freq: Two times a day (BID) | ORAL | Status: DC
  Filled 2023-08-30 (×5): qty 1

## 2023-08-30 MED ORDER — HYDRALAZINE HCL 50 MG PO TABS
75.0000 mg | ORAL_TABLET | Freq: Two times a day (BID) | ORAL | Status: DC
  Administered 2023-08-30 – 2023-08-31 (×4): 75 mg via ORAL
  Filled 2023-08-30 (×4): qty 1

## 2023-08-30 MED ORDER — POTASSIUM CHLORIDE CRYS ER 20 MEQ PO TBCR
40.0000 meq | EXTENDED_RELEASE_TABLET | ORAL | Status: AC
  Administered 2023-08-30 (×3): 40 meq via ORAL
  Filled 2023-08-30 (×2): qty 2
  Filled 2023-08-30: qty 4

## 2023-08-30 NOTE — Progress Notes (Signed)
 PROGRESS NOTE    Mark Hayden  ZOX:[id number] DOB: [birth year] DOA: 08/28/2023 PCP: Ether Hercules, MD   Brief Narrative:  Mark Hayden is a pleasant 48 y.o. male with medical history significant for hypertension, sleep apnea, CKD stage IV, and chronic HFpEF who presents with orthopnea and nonproductive cough - in the ED patient found to be volume overloaded/orthopnea with profound hypertension.  Hospitalist called for admission.  Assessment & Plan:   Principal Problem:   Hypertensive crisis Active Problems:   CKD (chronic kidney disease) stage 4, GFR 15-29 ml/min (HCC)   Acute on chronic heart failure with preserved ejection fraction (HFpEF) (HCC)   Elevated troponin     Hypertensive emergency, improving Elevated troponin, ACS ruled out Medication nonadherence - Troponin likely elevated in the setting of hypertensive emergency with endorgan damage, not suggestive of ACS -Cardiology following, appreciate insight and recommendations - Continue diuresis as renal function will tolerate - Continue amlodipine , isosorbide , hydralazine , nitroglycerin  infusion - Plan to decrease blood pressure slowly, patient will likely need cardiac MRI per cardiology -Patient reports profound diaphoresis and chest palpitations after taking carvedilol , currently discontinued -UDS pending -history of THC use  Acute on chronic HFpEF Hypokalemia - Continue diuresis with IV Lasix , update echocardiogram pending, monitor weight and I/Os, holding Farxiga  and carvedilol  - Continue to replace potassium as appropriate, 120 mEq today   AKI on CKD4  Metabolic acidosis - Baseline creatinine around 4.5, currently elevated in the setting of diuresis, 5.1 - Hold Farxiga , continue bicarb    DVT prophylaxis: heparin  injection 5,000 Units Start: 08/29/23 1400 Code Status:   Code Status: Full Code Family Communication: None present  Status is: Inpatient  Dispo: The patient is from:  Home              Anticipated d/c is to: Home              Anticipated d/c date is: 24 to 48 hours              Patient currently not medically stable for discharge  Consultants:  Cardiology  Procedures:  None  Antimicrobials:  None  Subjective: No acute issues or events overnight, headache improving but not resolved otherwise denies nausea vomiting diarrhea constipation fevers chills chest pain  Objective: Vitals:   08/29/23 2044 08/29/23 2300 08/30/23 0544 08/30/23 0734  BP: (!) 160/100 (!) 169/107  (!) 185/110  Pulse:  85 95 94  Resp:  20 18   Temp:  98.7 F (37.1 C) 98.9 F (37.2 C)   TempSrc:  Oral Oral   SpO2:  100% 100% 100%  Weight:   87.1 kg   Height:        Intake/Output Summary (Last 24 hours) at 08/30/2023 0759 Last data filed at 08/30/2023 0726 Gross per 24 hour  Intake 83.53 ml  Output 2650 ml  Net -2566.47 ml   Filed Weights   08/28/23 1745 08/30/23 0544  Weight: 93.4 kg 87.1 kg    Examination:  General:  Pleasantly resting in bed, No acute distress. HEENT:  Normocephalic atraumatic.  Sclerae nonicteric, noninjected.  Extraocular movements intact bilaterally. Neck:  Without mass or deformity.  Trachea is midline. Lungs:  Clear to auscultate bilaterally without rhonchi, wheeze, or rales. Heart:  Regular rate and rhythm.  Without murmurs, rubs, or gallops. Abdomen:  Soft, nontender, nondistended.  Without guarding or rebound. Extremities: Without cyanosis, clubbing, edema, or obvious deformity. Skin:  Warm and dry, no erythema.  Data Reviewed: I have personally reviewed following  labs and imaging studies  CBC: Recent Labs  Lab 08/28/23 1655 08/29/23 0448 08/30/23 0501  WBC 8.3 7.2 7.2  HGB 10.4* 9.8* 9.3*  HCT 33.0* 29.7* 28.0*  MCV 97.6 93.7 91.2  PLT 190 183 173   Basic Metabolic Panel: Recent Labs  Lab 08/28/23 1655 08/29/23 0448 08/30/23 0501  NA 143 139 138  K 3.5 3.2* 3.0*  CL 113* 108 105  CO2 19* 19* 23  GLUCOSE 94 110*  93  BUN 40* 40* 41*  CREATININE 5.03* 4.90* 5.13*  CALCIUM  7.2* 7.0* 6.6*   GFR: Estimated Creatinine Clearance: 19.7 mL/min (A) (by C-G formula based on SCr of 5.13 mg/dL (H)). Liver Function Tests: Recent Labs  Lab 08/29/23 0448  AST 20  ALT 15  ALKPHOS 78  BILITOT 0.7  PROT 6.6  ALBUMIN 3.6    No results found for this or any previous visit (from the past 240 hours).   Radiology Studies: ECHOCARDIOGRAM COMPLETE Result Date: 08/29/2023    ECHOCARDIOGRAM REPORT   Patient Name:   [facility] Date of Exam: 08/29/2023 Medical Rec #:  [id number]             Height:       69.0 in Accession #:    [id number]            Weight:       206.0 lb Date of Birth:  10-Mar-1976            BSA:          2.092 m Patient Age:    47 years              BP:           124/96 mmHg Patient Gender: M                     HR:           93 bpm. Exam Location:  Inpatient Procedure: 2D Echo, Cardiac Doppler, Color Doppler and Strain Analysis (Both            Spectral and Color Flow Doppler were utilized during procedure). Indications:    Elevated Troponin  History:        Patient has prior history of Echocardiogram examinations, most                 recent 01/09/2023.  Sonographer:    Griselda Lederer Referring Phys: [id number] TIMOTHY S OPYD IMPRESSIONS  1. Left ventricular ejection fraction, by estimation, is 60 to 65%. The left ventricle has normal function. The left ventricle has no regional wall motion abnormalities. Moderate to severe concentric left ventricular hypertrophy. Left ventricular diastolic parameters are indeterminate. The average left ventricular global longitudinal strain is -20.3 %. The global longitudinal strain is normal.  2. Right ventricular systolic function is normal. The right ventricular size is mildly enlarged. Increased septal RV thickness.  3. Left atrial size was moderately dilated.  4. Right atrial size was mildly dilated.  5. The mitral valve is normal in structure. Mild mitral valve  regurgitation. No evidence of mitral stenosis.  6. The aortic valve is tricuspid. There is mild calcification of the aortic valve. Aortic valve regurgitation is not visualized. No aortic stenosis is present.  7. The inferior vena cava is normal in size with greater than 50% respiratory variability, suggesting right atrial pressure of 3 mmHg. Comparison(s): No significant change from prior study. Conclusion(s)/Recommendation(s): Otherwise normal echocardiogram, with minor abnormalities described in  the report. FINDINGS  Left Ventricle: Left ventricular ejection fraction, by estimation, is 60 to 65%. The left ventricle has normal function. The left ventricle has no regional wall motion abnormalities. The average left ventricular global longitudinal strain is -20.3 %. Strain was performed and the global longitudinal strain is normal. 3D ejection fraction reviewed and evaluated as part of the interpretation. Alternate measurement of EF is felt to be most reflective of LV function. The left ventricular internal cavity size was normal in size. Moderate to severe concentric left ventricular hypertrophy. Left ventricular diastolic parameters are indeterminate. Right Ventricle: The right ventricular size is mildly enlarged. Increased septal RV thickness. Right ventricular systolic function is normal. Left Atrium: Left atrial size was moderately dilated. Right Atrium: Right atrial size was mildly dilated. Pericardium: There is no evidence of pericardial effusion. Mitral Valve: The mitral valve is normal in structure. Mild mitral valve regurgitation. No evidence of mitral valve stenosis. Tricuspid Valve: The tricuspid valve is normal in structure. Tricuspid valve regurgitation is trivial. No evidence of tricuspid stenosis. Aortic Valve: The aortic valve is tricuspid. There is mild calcification of the aortic valve. Aortic valve regurgitation is not visualized. No aortic stenosis is present. Pulmonic Valve: The pulmonic valve  was not well visualized. Pulmonic valve regurgitation is mild to moderate. No evidence of pulmonic stenosis. Aorta: The aortic root, ascending aorta, aortic arch and descending aorta are all structurally normal, with no evidence of dilitation or obstruction. Venous: The inferior vena cava is normal in size with greater than 50% respiratory variability, suggesting right atrial pressure of 3 mmHg. IAS/Shunts: The atrial septum is grossly normal. Additional Comments: There is a small pleural effusion in both left and right lateral regions.  LEFT VENTRICLE PLAX 2D LVIDd:         5.00 cm   Diastology LVIDs:         3.30 cm   LV e' medial:    5.59 cm/s LV PW:         1.50 cm   LV E/e' medial:  14.7 LV IVS:        1.60 cm   LV e' lateral:   7.87 cm/s LVOT diam:     2.20 cm   LV E/e' lateral: 10.5 LVOT Area:     3.80 cm                          2D Longitudinal Strain                          2D Strain GLS (A4C):   -24.9 %                          2D Strain GLS (A3C):   -19.0 %                          2D Strain GLS (A2C):   -17.0 %                          2D Strain GLS Avg:     -20.3 %                           3D Volume EF  LV 3D EDV:   194.20 ml                          LV 3D ESV:   75.85 ml RIGHT VENTRICLE             IVC RV Basal diam:  4.70 cm     IVC diam: 1.00 cm RV S prime:     15.00 cm/s TAPSE (M-mode): 2.2 cm LEFT ATRIUM             Index LA diam:        5.00 cm 2.39 cm/m LA Vol (A2C):   71.4 ml 34.13 ml/m LA Vol (A4C):   82.5 ml 39.43 ml/m LA Biplane Vol: 83.0 ml 39.67 ml/m                        PULMONIC VALVE AORTA                 PR End Diast Vel: 10.76 msec Ao Root diam: 3.50 cm Ao Asc diam:  3.10 cm MITRAL VALVE MV Area (PHT): 4.44 cm    SHUNTS MV Decel Time: 171 msec    Systemic Diam: 2.20 cm MV E velocity: 82.40 cm/s MV A velocity: 63.70 cm/s MV E/A ratio:  1.29 Sheryle Donning MD Electronically signed by Sheryle Donning MD Signature Date/Time: 08/29/2023/5:02:22  PM    Final    CT Head Wo Contrast Result Date: 08/28/2023 CLINICAL DATA:  Headache EXAM: CT HEAD WITHOUT CONTRAST TECHNIQUE: Contiguous axial images were obtained from the base of the skull through the vertex without intravenous contrast. RADIATION DOSE REDUCTION: This exam was performed according to the departmental dose-optimization program which includes automated exposure control, adjustment of the mA and/or kV according to patient size and/or use of iterative reconstruction technique. COMPARISON:  09/07/2019 FINDINGS: Brain: No acute intracranial abnormality. Specifically, no hemorrhage, hydrocephalus, mass lesion, acute infarction, or significant intracranial injury. Vascular: No hyperdense vessel or unexpected calcification. Skull: No acute calvarial abnormality. Sinuses/Orbits: No acute findings Other: None IMPRESSION: No acute intracranial abnormality. Electronically Signed   By: Janeece Mechanic M.D.   On: 08/28/2023 22:21   DG Chest 2 View Result Date: 08/28/2023 CLINICAL DATA:  Shortness of breath for 3 days. EXAM: CHEST - 2 VIEW COMPARISON:  January 07, 2023. FINDINGS: Stable cardiomediastinal silhouette. Minimal bibasilar subsegmental atelectasis is noted. Bony thorax is unremarkable. IMPRESSION: Minimal bibasilar subsegmental atelectasis. Electronically Signed   By: Rosalene Colon M.D.   On: 08/28/2023 17:29   Scheduled Meds:  amLODipine   10 mg Oral Daily   aspirin  EC  81 mg Oral Daily   atorvastatin   20 mg Oral Daily   furosemide   40 mg Intravenous BID   heparin   5,000 Units Subcutaneous Q8H   hydrALAZINE   50 mg Oral Q12H   isosorbide  mononitrate  60 mg Oral Daily   sertraline   50 mg Oral Daily   sodium bicarbonate   650 mg Oral BID   sodium chloride  flush  3 mL Intravenous Q12H   Continuous Infusions:  nitroGLYCERIN  35 mcg/min (08/30/23 0733)     LOS: 2 days   Time spent:  Haydee Lipa, DO [facility]  If 7PM-7AM, please contact  night-coverage [email or web address]  08/30/2023, 7:59 AM

## 2023-08-30 NOTE — Progress Notes (Addendum)
 Progress Note  Patient Name: Mark Hayden Date of Encounter: 08/30/2023 [facility] Cardiologist: Alwin Baars, DO   Interval Summary   Patient reports he is doing well, mild headache  Continuing to conservatively lower BP  Will discuss possible cMRI while admitted   Vital Signs Vitals:   08/29/23 2300 08/30/23 0544 08/30/23 0734 08/30/23 0851  BP: (!) 169/107  (!) 185/110 (!) 178/110  Pulse: 85 95 94 96  Resp: 20 18  16   Temp: 98.7 F (37.1 C) 98.9 F (37.2 C)  98.6 F (37 C)  TempSrc: Oral Oral  Oral  SpO2: 100% 100% 100% 99%  Weight:  87.1 kg    Height:        Intake/Output Summary (Last 24 hours) at 08/30/2023 0901 Last data filed at 08/30/2023 0726 Gross per 24 hour  Intake 186.82 ml  Output 2650 ml  Net -2463.18 ml      08/30/2023    5:44 AM 08/28/2023    5:45 PM 06/07/2023    1:17 PM  Last 3 Weights  Weight (lbs) 192 lb 1.6 oz 206 lb 206 lb  Weight (kg) 87.136 kg 93.441 kg 93.441 kg      Telemetry/ECG  Sinus, HR 90s - Personally Reviewed  Physical Exam  GEN: No acute distress.   Neck: No JVD Cardiac: RRR, no murmurs, rubs, or gallops.  Respiratory: Clear to auscultation bilaterally. GI: Soft, nontender, non-distended  MS: No edema  Assessment & Plan  Mark Hayden is a 48 y.o. male with a hx of uncontrolled HTN, HFpEF (EF = 60-65%), severe LVH, CKD4, OSA and celiac stenosis who is being seen for hypertension, elevated troponins    Hypertensive emergency Elevated troponin levels Presented with BP as high as 249/149 Reports chest pain, shortness of breath x 3 days Reports noncompliance with medication Reports GI upset with CoReg  Most recent BP 178/110, reducing blood pressure conservatively  Options for antihypertensives limited due to renal dysfunction Troponin levels 201 > 213  Echo this admission showed: EF 60-65%, no LV RWMA, moderate to severe LVH, normal RV function, increased RV septal thickness, biatrial  enlargement, mild MR, normal IVC Currently on IV nitroglycerin  at 35 mcg/min, amlodipine  10 mg daily, hydralazine  75 mg BID, Imdur  60 mg daily   Chronic HFpEF Severe LVH Presented with shortness of breath BNP 631, in the setting of CKD stage IV Home meds: Hydralazine  50 mg TID + Imdur  60 mg daily, Farxiga  10 mg daily, Coreg  37.5 mg BID, torsemide  20 mg daily + K supplementation Echo from 12/2022: EF 60 to 65%, moderate LVH, G2 DD, normal RV function, mildly elevated PASP, biatrial enlargement, mild MR, normal IVC Echo this admission showed: EF 60-65%, no LV RWMA, moderate to severe LVH, normal RV function, increased RV septal thickness, biatrial enlargement, mild MR, normal IVC Medications as above Stopped IV Lasix  Patient was set to undergo cardiac MRI as an outpatient, to rule out infiltrative disease   Per primary 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  Agree as above, with the following exceptions and changes as noted below. Headache and appears tired. Gen: NAD, CV: RRR, no murmurs, Lungs: clear, Abd: soft, Extrem: Warm, well perfused, no edema, Neuro/Psych: alert and oriented x 3, normal mood and affect. All  available labs, radiology testing, previous records reviewed. Remains on nitroglycerin  infusion for BP control. Agree with other medical therapy. May do well on doxazosin, agree that short term BP goal may need to be around 160/100 because he is symptomatic with his lower BP which is still severely elevated. - consider adding doxazosin 2 mg daily and monitor response.  - will not pursue inpatient MRI at this time due to lab availability and cr requiring nephrology consent. Can continue to pursue outpatient. 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  - would benefit from  advanced HTN clinic as outpatient.  Furman Trentman A Kieran Nachtigal, MD 08/30/23 10:39 AM

## 2023-08-31 DIAGNOSIS — N184 Chronic kidney disease, stage 4 (severe): Secondary | ICD-10-CM | POA: Diagnosis not present

## 2023-08-31 DIAGNOSIS — Z91148 Patient's other noncompliance with medication regimen for other reason: Secondary | ICD-10-CM

## 2023-08-31 DIAGNOSIS — R7989 Other specified abnormal findings of blood chemistry: Secondary | ICD-10-CM | POA: Diagnosis not present

## 2023-08-31 DIAGNOSIS — I169 Hypertensive crisis, unspecified: Secondary | ICD-10-CM | POA: Diagnosis not present

## 2023-08-31 DIAGNOSIS — I5033 Acute on chronic diastolic (congestive) heart failure: Secondary | ICD-10-CM | POA: Diagnosis not present

## 2023-08-31 LAB — TROPONIN I (HIGH SENSITIVITY)
Troponin I (High Sensitivity): 141 ng/L (ref <18)
Troponin I (High Sensitivity): 191 ng/L (ref <18)

## 2023-08-31 LAB — BASIC METABOLIC PANEL WITH GFR
Anion gap: 7 (ref 5–15)
BUN: 41 mg/dL — ABNORMAL HIGH (ref 6–20)
CO2: 22 mmol/L (ref 22–32)
Calcium: 6.8 mg/dL — ABNORMAL LOW (ref 8.9–10.3)
Chloride: 106 mmol/L (ref 98–111)
Creatinine, Ser: 4.96 mg/dL — ABNORMAL HIGH (ref 0.61–1.24)
GFR, Estimated: 14 mL/min — ABNORMAL LOW (ref >60)
Glucose, Bld: 104 mg/dL — ABNORMAL HIGH (ref 70–99)
Potassium: 3.9 mmol/L (ref 3.5–5.1)
Sodium: 135 mmol/L (ref 135–145)

## 2023-08-31 LAB — CBC
HCT: 27.7 % — ABNORMAL LOW (ref 39.0–52.0)
Hemoglobin: 9.2 g/dL — ABNORMAL LOW (ref 13.0–17.0)
MCH: 30.5 pg (ref 26.0–34.0)
MCHC: 33.2 g/dL (ref 30.0–36.0)
MCV: 91.7 fL (ref 80.0–100.0)
Platelets: 157 10*3/uL (ref 150–400)
RBC: 3.02 MIL/uL — ABNORMAL LOW (ref 4.22–5.81)
RDW: 13.5 % (ref 11.5–15.5)
WBC: 7 10*3/uL (ref 4.0–10.5)
nRBC: 0 % (ref 0.0–0.2)

## 2023-08-31 MED ORDER — ONDANSETRON HCL 4 MG/2ML IJ SOLN: 4.0000 mg | Freq: Three times a day (TID) | INTRAMUSCULAR | Status: DC

## 2023-08-31 MED ORDER — BLOOD PRESSURE CONTROL BOOK
Freq: Once | Status: AC
  Filled 2023-08-31: qty 1

## 2023-08-31 MED ORDER — DOXAZOSIN MESYLATE 2 MG PO TABS
2.0000 mg | ORAL_TABLET | Freq: Every day | ORAL | Status: DC
  Administered 2023-08-31: 2 mg via ORAL
  Filled 2023-08-31 (×2): qty 1

## 2023-08-31 MED ORDER — ONDANSETRON HCL 4 MG/2ML IJ SOLN
4.0000 mg | Freq: Three times a day (TID) | INTRAMUSCULAR | Status: DC | PRN
  Administered 2023-08-31: 4 mg via INTRAVENOUS
  Filled 2023-08-31: qty 2

## 2023-08-31 NOTE — Progress Notes (Signed)
 PROGRESS NOTE    Mark Hayden  ONG:[id number] DOB: 07-22-1975 DOA: 08/28/2023 PCP: Ether Hercules, MD   Brief Narrative:  Mark Hayden is a pleasant 48 y.o. male with medical history significant for hypertension, sleep apnea, CKD stage IV, and chronic HFpEF who presents with orthopnea and nonproductive cough - in the ED patient found to be volume overloaded/orthopnea with profound hypertension.  Hospitalist called for admission.  Assessment & Plan:   Principal Problem:   Hypertensive crisis Active Problems:   CKD (chronic kidney disease) stage 4, GFR 15-29 ml/min (HCC)   Acute on chronic heart failure with preserved ejection fraction (HFpEF) (HCC)   Elevated troponin  Hypertensive emergency, improving Elevated troponin, ACS ruled out Medication nonadherence - Troponin likely elevated in the setting of hypertensive emergency with endorgan damage, not suggestive of ACS -downtrending appropriately - Cardiology following, appreciate insight and recommendations - Continue diuresis as renal function will tolerate - Continue amlodipine , isosorbide , hydralazine , nitroglycerin  infusion - Blood pressure still poorly controlled (SBP 150-160) add doxazosin 2 mg -wean nitro drip, current goal to decrease systolic blood pressure by 10 points daily until at goal of 120 - Patient will likely need cardiac MRI per cardiology  Acute on chronic HFpEF Hypokalemia - Continue diuresis with IV Lasix , update echocardiogram pending, monitor weight and I/Os, holding Farxiga  and carvedilol  - Continue to replace potassium as appropriate, 120 mEq today   AKI on CKD4  Metabolic acidosis - Baseline creatinine around 4.5, currently elevated in the setting of diuresis, 5.1 - Hold Farxiga , continue bicarb    DVT prophylaxis: heparin  injection 5,000 Units Start: 08/29/23 1400 Code Status:   Code Status: Full Code Family Communication: None present  Status is: Inpatient  Dispo:  The patient is from: Home              Anticipated d/c is to: Home              Anticipated d/c date is: 24 to 48 hours              Patient currently not medically stable for discharge  Consultants:  Cardiology  Procedures:  None  Antimicrobials:  None  Subjective: No acute issues or events overnight, headache improving but not resolved otherwise denies nausea vomiting diarrhea constipation fevers chills chest pain  Objective: Vitals:   08/31/23 0437 08/31/23 0507 08/31/23 0537 08/31/23 0607  BP: (!) 175/114 (!) 171/97 (!) 186/100 (!) 167/109  Pulse: 92 91  93  Resp:      Temp:      TempSrc:      SpO2: 100% 100%  100%  Weight:      Height:        Intake/Output Summary (Last 24 hours) at 08/31/2023 0811 Last data filed at 08/31/2023 0700 Gross per 24 hour  Intake 871.24 ml  Output --  Net 871.24 ml   Filed Weights   08/28/23 1745 08/30/23 0544 08/31/23 0301  Weight: 93.4 kg 87.1 kg 88.6 kg    Examination:  General:  Pleasantly resting in bed, No acute distress. HEENT:  Normocephalic atraumatic.  Sclerae nonicteric, noninjected.  Extraocular movements intact bilaterally. Neck:  Without mass or deformity.  Trachea is midline. Lungs:  Clear to auscultate bilaterally without rhonchi, wheeze, or rales. Heart:  Regular rate and rhythm.  Without murmurs, rubs, or gallops. Abdomen:  Soft, nontender, nondistended.  Without guarding or rebound. Extremities: Without cyanosis, clubbing, edema, or obvious deformity. Skin:  Warm and dry, no erythema.  Data Reviewed: I  have personally reviewed following labs and imaging studies  CBC: Recent Labs  Lab 08/28/23 1655 08/29/23 0448 08/30/23 0501 08/31/23 0427  WBC 8.3 7.2 7.2 7.0  HGB 10.4* 9.8* 9.3* 9.2*  HCT 33.0* 29.7* 28.0* 27.7*  MCV 97.6 93.7 91.2 91.7  PLT 190 183 173 157   Basic Metabolic Panel: Recent Labs  Lab 08/28/23 1655 08/29/23 0448 08/30/23 0501 08/31/23 0427  NA 143 139 138 135  K 3.5 3.2* 3.0*  3.9  CL 113* 108 105 106  CO2 19* 19* 23 22  GLUCOSE 94 110* 93 104*  BUN 40* 40* 41* 41*  CREATININE 5.03* 4.90* 5.13* 4.96*  CALCIUM  7.2* 7.0* 6.6* 6.8*   GFR: Estimated Creatinine Clearance: 20.5 mL/min (A) (by C-G formula based on SCr of 4.96 mg/dL (H)). Liver Function Tests: Recent Labs  Lab 08/29/23 0448  AST 20  ALT 15  ALKPHOS 78  BILITOT 0.7  PROT 6.6  ALBUMIN 3.6    No results found for this or any previous visit (from the past 240 hours).   Radiology Studies: ECHOCARDIOGRAM COMPLETE Result Date: 08/29/2023    ECHOCARDIOGRAM REPORT   Patient Name:   [facility] Date of Exam: 08/29/2023 Medical Rec #:  [id number]             Height:       69.0 in Accession #:    [id number]            Weight:       206.0 lb Date of Birth:  08/09/1975            BSA:          2.092 m Patient Age:    47 years              BP:           124/96 mmHg Patient Gender: M                     HR:           93 bpm. Exam Location:  Inpatient Procedure: 2D Echo, Cardiac Doppler, Color Doppler and Strain Analysis (Both            Spectral and Color Flow Doppler were utilized during procedure). Indications:    Elevated Troponin  History:        Patient has prior history of Echocardiogram examinations, most                 recent 01/09/2023.  Sonographer:    Griselda Lederer Referring Phys: [id number] TIMOTHY S OPYD IMPRESSIONS  1. Left ventricular ejection fraction, by estimation, is 60 to 65%. The left ventricle has normal function. The left ventricle has no regional wall motion abnormalities. Moderate to severe concentric left ventricular hypertrophy. Left ventricular diastolic parameters are indeterminate. The average left ventricular global longitudinal strain is -20.3 %. The global longitudinal strain is normal.  2. Right ventricular systolic function is normal. The right ventricular size is mildly enlarged. Increased septal RV thickness.  3. Left atrial size was moderately dilated.  4. Right atrial size  was mildly dilated.  5. The mitral valve is normal in structure. Mild mitral valve regurgitation. No evidence of mitral stenosis.  6. The aortic valve is tricuspid. There is mild calcification of the aortic valve. Aortic valve regurgitation is not visualized. No aortic stenosis is present.  7. The inferior vena cava is normal in size with greater than 50% respiratory variability, suggesting right  atrial pressure of 3 mmHg. Comparison(s): No significant change from prior study. Conclusion(s)/Recommendation(s): Otherwise normal echocardiogram, with minor abnormalities described in the report. FINDINGS  Left Ventricle: Left ventricular ejection fraction, by estimation, is 60 to 65%. The left ventricle has normal function. The left ventricle has no regional wall motion abnormalities. The average left ventricular global longitudinal strain is -20.3 %. Strain was performed and the global longitudinal strain is normal. 3D ejection fraction reviewed and evaluated as part of the interpretation. Alternate measurement of EF is felt to be most reflective of LV function. The left ventricular internal cavity size was normal in size. Moderate to severe concentric left ventricular hypertrophy. Left ventricular diastolic parameters are indeterminate. Right Ventricle: The right ventricular size is mildly enlarged. Increased septal RV thickness. Right ventricular systolic function is normal. Left Atrium: Left atrial size was moderately dilated. Right Atrium: Right atrial size was mildly dilated. Pericardium: There is no evidence of pericardial effusion. Mitral Valve: The mitral valve is normal in structure. Mild mitral valve regurgitation. No evidence of mitral valve stenosis. Tricuspid Valve: The tricuspid valve is normal in structure. Tricuspid valve regurgitation is trivial. No evidence of tricuspid stenosis. Aortic Valve: The aortic valve is tricuspid. There is mild calcification of the aortic valve. Aortic valve regurgitation is  not visualized. No aortic stenosis is present. Pulmonic Valve: The pulmonic valve was not well visualized. Pulmonic valve regurgitation is mild to moderate. No evidence of pulmonic stenosis. Aorta: The aortic root, ascending aorta, aortic arch and descending aorta are all structurally normal, with no evidence of dilitation or obstruction. Venous: The inferior vena cava is normal in size with greater than 50% respiratory variability, suggesting right atrial pressure of 3 mmHg. IAS/Shunts: The atrial septum is grossly normal. Additional Comments: There is a small pleural effusion in both left and right lateral regions.  LEFT VENTRICLE PLAX 2D LVIDd:         5.00 cm   Diastology LVIDs:         3.30 cm   LV e' medial:    5.59 cm/s LV PW:         1.50 cm   LV E/e' medial:  14.7 LV IVS:        1.60 cm   LV e' lateral:   7.87 cm/s LVOT diam:     2.20 cm   LV E/e' lateral: 10.5 LVOT Area:     3.80 cm                          2D Longitudinal Strain                          2D Strain GLS (A4C):   -24.9 %                          2D Strain GLS (A3C):   -19.0 %                          2D Strain GLS (A2C):   -17.0 %                          2D Strain GLS Avg:     -20.3 %  3D Volume EF                          LV 3D EDV:   194.20 ml                          LV 3D ESV:   75.85 ml RIGHT VENTRICLE             IVC RV Basal diam:  4.70 cm     IVC diam: 1.00 cm RV S prime:     15.00 cm/s TAPSE (M-mode): 2.2 cm LEFT ATRIUM             Index LA diam:        5.00 cm 2.39 cm/m LA Vol (A2C):   71.4 ml 34.13 ml/m LA Vol (A4C):   82.5 ml 39.43 ml/m LA Biplane Vol: 83.0 ml 39.67 ml/m                        PULMONIC VALVE AORTA                 PR End Diast Vel: 10.76 msec Ao Root diam: 3.50 cm Ao Asc diam:  3.10 cm MITRAL VALVE MV Area (PHT): 4.44 cm    SHUNTS MV Decel Time: 171 msec    Systemic Diam: 2.20 cm MV E velocity: 82.40 cm/s MV A velocity: 63.70 cm/s MV E/A ratio:  1.29 Sheryle Donning MD  Electronically signed by Sheryle Donning MD Signature Date/Time: 08/29/2023/5:02:22 PM    Final    Scheduled Meds:  amLODipine   10 mg Oral Daily   aspirin  EC  81 mg Oral Daily   atorvastatin   20 mg Oral Daily   heparin   5,000 Units Subcutaneous Q8H   hydrALAZINE   75 mg Oral Q12H   isosorbide  mononitrate  60 mg Oral Daily   polyethylene glycol  17 g Oral BID   sertraline   50 mg Oral Daily   sodium bicarbonate   650 mg Oral BID   sodium chloride  flush  3 mL Intravenous Q12H   Continuous Infusions:  nitroGLYCERIN  70 mcg/min (08/31/23 0725)     LOS: 3 days   Time spent:  Haydee Lipa, DO [facility]  If 7PM-7AM, please contact night-coverage [email or web address]  08/31/2023, 8:11 AM

## 2023-08-31 NOTE — Plan of Care (Signed)
  Problem: Pain Managment: Goal: General experience of comfort will improve and/or be controlled Outcome: Progressing   Problem: Safety: Goal: Ability to remain free from injury will improve Outcome: Progressing   Problem: Skin Integrity: Goal: Risk for impaired skin integrity will decrease Outcome: Progressing

## 2023-08-31 NOTE — Progress Notes (Signed)
 Patient called out to nurses' station; upon assessment, patient was stating he had chest pain, BP was 143/80. RN obtained EKG; patient became very anxious, stating he felt nauseous and dizzy and that chest pain was worsening. RN obtained another BP which was 115/57. RN immediately began to titrate nitroglycerin  drip down. RN sent message to Dr. Rudine Cos. Updated primary RN, Barnabas Lia. Nitroglycerin  drip turned off at 1047, patient's BP is 124/67.

## 2023-08-31 NOTE — Progress Notes (Signed)
  Progress Note  Patient Name: Mark Hayden Date of Encounter: 08/31/2023 [facility] Cardiologist: Alwin Baars, DO   Interval Summary   Patient reports he is doing well, mild headache  Continuing to conservatively lower BP  Will discuss possible cMRI while admitted   Vital Signs Vitals:   08/31/23 1037 08/31/23 1043 08/31/23 1047 08/31/23 1111  BP: (!) 109/50 120/60 124/67 (!) 147/83  Pulse: 87 91 92   Resp:      Temp:      TempSrc:      SpO2: 99% 100% 100%   Weight:      Height:        Intake/Output Summary (Last 24 hours) at 08/31/2023 1155 Last data filed at 08/31/2023 0914 Gross per 24 hour  Intake 1142.3 ml  Output --  Net 1142.3 ml      08/31/2023    3:01 AM 08/30/2023    5:44 AM 08/28/2023    5:45 PM  Last 3 Weights  Weight (lbs) 195 lb 4.8 oz 192 lb 1.6 oz 206 lb  Weight (kg) 88.587 kg 87.136 kg 93.441 kg      Telemetry/ECG  Sinus, HR 90s - Personally Reviewed  Physical Exam  GEN: No acute distress.   Neck: No JVD Cardiac: RRR, no murmurs, rubs, or gallops.  Respiratory: Clear to auscultation bilaterally. GI: Soft, nontender, non-distended  MS: No edema  Assessment & Plan  Mark Hayden is a 48 y.o. male with a hx of uncontrolled HTN, HFpEF (EF = 60-65%), severe LVH, CKD4, OSA and celiac stenosis who is being seen for hypertension, elevated troponins    Hypertensive emergency Elevated troponin levels Presented with BP as high as 249/149 Reports chest pain, shortness of breath x 3 days Reports noncompliance with medication Reports GI upset with CoReg  Options for antihypertensives limited due to renal dysfunction Troponin levels 201 > 213  Echo this admission showed: EF 60-65%, no LV RWMA, moderate to severe LVH, normal RV function, increased RV septal thickness, biatrial enlargement, mild MR, normal IVC Currently on IV nitroglycerin  at 35 mcg/min, amlodipine  10 mg daily, hydralazine  75 mg BID, Imdur  60 mg daily Added  cardura 2mg  this AM, BP dropped and patient quite symptomatic. Will stop nitroglycerin  infusion and monitor. Given chest pain will check a single troponin.    Chronic HFpEF Severe LVH Presented with shortness of breath BNP 631, in the setting of CKD stage IV Home meds: Hydralazine  50 mg TID + Imdur  60 mg daily, Farxiga  10 mg daily, Coreg  37.5 mg BID, torsemide  20 mg daily + K supplementation Echo from 12/2022: EF 60 to 65%, moderate LVH, G2 DD, normal RV function, mildly elevated PASP, biatrial enlargement, mild MR, normal IVC Echo this admission showed: EF 60-65%, no LV RWMA, moderate to severe LVH, normal RV function, increased RV septal thickness, biatrial enlargement, mild MR, normal IVC Medications as above Stopped IV Lasix  Patient was set to undergo cardiac MRI as an outpatient, to rule out infiltrative disease, continue to plan as outpatient.   Per primary Celiac stenosis AKI on CKD stage IV Depression     For questions or updates, please contact [facility] Please consult [email or web address] for contact info under       Signed, Tanessa Tidd A Keylie Beavers, MD

## 2023-08-31 NOTE — Plan of Care (Signed)

## 2023-09-01 DIAGNOSIS — I5033 Acute on chronic diastolic (congestive) heart failure: Secondary | ICD-10-CM | POA: Diagnosis not present

## 2023-09-01 DIAGNOSIS — I169 Hypertensive crisis, unspecified: Secondary | ICD-10-CM | POA: Diagnosis not present

## 2023-09-01 DIAGNOSIS — N184 Chronic kidney disease, stage 4 (severe): Secondary | ICD-10-CM | POA: Diagnosis not present

## 2023-09-01 DIAGNOSIS — R7989 Other specified abnormal findings of blood chemistry: Secondary | ICD-10-CM | POA: Diagnosis not present

## 2023-09-01 LAB — CBC
HCT: 28.9 % — ABNORMAL LOW (ref 39.0–52.0)
Hemoglobin: 9.5 g/dL — ABNORMAL LOW (ref 13.0–17.0)
MCH: 30.9 pg (ref 26.0–34.0)
MCHC: 32.9 g/dL (ref 30.0–36.0)
MCV: 94.1 fL (ref 80.0–100.0)
Platelets: 148 10*3/uL — ABNORMAL LOW (ref 150–400)
RBC: 3.07 MIL/uL — ABNORMAL LOW (ref 4.22–5.81)
RDW: 13.5 % (ref 11.5–15.5)
WBC: 6 10*3/uL (ref 4.0–10.5)
nRBC: 0 % (ref 0.0–0.2)

## 2023-09-01 LAB — BASIC METABOLIC PANEL WITH GFR
Anion gap: 10 (ref 5–15)
BUN: 41 mg/dL — ABNORMAL HIGH (ref 6–20)
CO2: 20 mmol/L — ABNORMAL LOW (ref 22–32)
Calcium: 7.6 mg/dL — ABNORMAL LOW (ref 8.9–10.3)
Chloride: 106 mmol/L (ref 98–111)
Creatinine, Ser: 4.6 mg/dL — ABNORMAL HIGH (ref 0.61–1.24)
GFR, Estimated: 15 mL/min — ABNORMAL LOW (ref >60)
Glucose, Bld: 96 mg/dL (ref 70–99)
Potassium: 4.6 mmol/L (ref 3.5–5.1)
Sodium: 136 mmol/L (ref 135–145)

## 2023-09-01 MED ORDER — HYDRALAZINE HCL 50 MG PO TABS
100.0000 mg | ORAL_TABLET | Freq: Three times a day (TID) | ORAL | Status: DC
  Administered 2023-09-01 – 2023-09-02 (×3): 100 mg via ORAL
  Filled 2023-09-01 (×3): qty 2

## 2023-09-01 MED ORDER — DOXAZOSIN MESYLATE 4 MG PO TABS
4.0000 mg | ORAL_TABLET | Freq: Every day | ORAL | Status: DC
  Administered 2023-09-01 – 2023-09-02 (×2): 4 mg via ORAL
  Filled 2023-09-01 (×3): qty 1

## 2023-09-01 MED ORDER — ALUM & MAG HYDROXIDE-SIMETH 200-200-20 MG/5ML PO SUSP
30.0000 mL | ORAL | Status: DC | PRN
  Filled 2023-09-01: qty 30

## 2023-09-01 NOTE — Progress Notes (Signed)
  Progress Note  Patient Name: Mark Hayden Date of Encounter: 09/01/2023 [facility] Cardiologist: Mark Baars, DO   Interval Summary   No inpatient cMRI planned, pursue as outpt BP remains poorly controlled and patient did not tolerate low BP yesterday. Trop actually went down with chest pain and normotension. Feels better with BP 175/110  Vital Signs Vitals:   09/01/23 0517 09/01/23 0617 09/01/23 0717 09/01/23 0725  BP: (!) 163/99 (!) 183/108 (!) 171/104 (!) 173/107  Pulse:      Resp:      Temp:      TempSrc:      SpO2:      Weight:      Height:        Intake/Output Summary (Last 24 hours) at 09/01/2023 0823 Last data filed at 09/01/2023 0520 Gross per 24 hour  Intake 298.22 ml  Output 850 ml  Net -551.78 ml      09/01/2023    5:16 AM 08/31/2023    3:01 AM 08/30/2023    5:44 AM  Last 3 Weights  Weight (lbs) 194 lb 11.2 oz 195 lb 4.8 oz 192 lb 1.6 oz  Weight (kg) 88.315 kg 88.587 kg 87.136 kg      Telemetry/ECG  Sinus, HR 90s , short run of NSVT <10 beats- Personally Reviewed  Physical Exam  GEN: No acute distress.   Neck: No JVD Cardiac: RRR, no murmurs, rubs, or gallops.  Respiratory: Clear to auscultation bilaterally. GI: Soft, nontender, non-distended  MS: No edema  Assessment & Plan  Mark Hayden is a 48 y.o. male with a hx of uncontrolled HTN, HFpEF (EF = 60-65%), severe LVH, CKD4, OSA and celiac stenosis who is being seen for hypertension, elevated troponins    Hypertensive emergency Elevated troponin levels Presented with BP as high as 249/149 Reports chest pain, shortness of breath x 3 days Reports noncompliance with medication Reports GI upset with CoReg  Options for antihypertensives limited due to renal dysfunction Troponin levels 201 > 213  Echo this admission showed: EF 60-65%, no LV RWMA, moderate to severe LVH, normal RV function, increased RV septal thickness, biatrial enlargement, mild MR, normal  IVC Recommend weaning nitroglycerin  gtt, continue doxazosin 2 mg daily, amlodipine  10 mg daily, hydralazine  100 mg TID, Imdur  60 mg daily. Can increase doxazosin today to 4 mg. Ntg drip off.    Chronic HFpEF Severe LVH Presented with shortness of breath BNP 631, in the setting of CKD stage IV Home meds: Hydralazine  50 mg TID + Imdur  60 mg daily, Farxiga  10 mg daily, Coreg  37.5 mg BID, torsemide  20 mg daily + K supplementation Echo from 12/2022: EF 60 to 65%, moderate LVH, G2 DD, normal RV function, mildly elevated PASP, biatrial enlargement, mild MR, normal IVC Echo this admission showed: EF 60-65%, no LV RWMA, moderate to severe LVH, normal RV function, increased RV septal thickness, biatrial enlargement, mild MR, normal IVC Medications as above Plan for outpatient cardiac MR. Overall euvolemic, Cr elevated, holding diuresis.   Per primary Celiac stenosis AKI on CKD stage IV Depression     For questions or updates, please contact [facility] Please consult [email or web address] for contact info under       Signed, Kirat Mezquita A Verdis Bassette, MD

## 2023-09-01 NOTE — Plan of Care (Signed)
°  Problem: Activity: Goal: Capacity to carry out activities will improve Outcome: Progressing   Problem: Cardiac: Goal: Ability to achieve and maintain adequate cardiopulmonary perfusion will improve Outcome: Progressing   Problem: Education: Goal: Knowledge of General Education information will improve Description: Including pain rating scale, medication(s)/side effects and non-pharmacologic comfort measures Outcome: Progressing   Problem: Health Behavior/Discharge Planning: Goal: Ability to manage health-related needs will improve Outcome: Progressing   

## 2023-09-01 NOTE — Plan of Care (Signed)

## 2023-09-01 NOTE — Progress Notes (Signed)
 PROGRESS NOTE    Mark Hayden  WUJ:[id number] DOB: Jun 02, 1975 DOA: 08/28/2023 PCP: Ether Hercules, MD   Brief Narrative:  Mark Hayden is a pleasant 48 y.o. male with medical history significant for hypertension, sleep apnea, CKD stage IV, and chronic HFpEF who presents with orthopnea and nonproductive cough - in the ED patient found to be volume overloaded/orthopnea with profound hypertension.  Hospitalist called for admission.  Assessment & Plan:   Principal Problem:   Hypertensive crisis Active Problems:   CKD (chronic kidney disease) stage 4, GFR 15-29 ml/min (HCC)   Acute on chronic heart failure with preserved ejection fraction (HFpEF) (HCC)   Elevated troponin   Hypertensive emergency, improving Elevated troponin, ACS ruled out Medication nonadherence - Troponin likely elevated in the setting of hypertensive emergency with endorgan damage, not suggestive of ACS -downtrending appropriately - Cardiology following, appreciate insight and recommendations - Continue diuresis as renal function will tolerate - Continue amlodipine , isosorbide  - Increase hydralazine  to 100mg  TID and increase cardura - Nitrodrip off 6/14 - Patient will likely need cardiac MRI at some point per cardiology - Renal US  last fall WNL  Acute on chronic HFpEF Hypokalemia - Diuretics held, update echocardiogram pending, monitor weight and I/Os, holding Farxiga  and carvedilol  - Continue to replace potassium as appropriate, 120 mEq today   AKI on CKD4  Metabolic acidosis - Baseline creatinine around 4.5, currently elevated in the setting of diuresis, 5.1 - Hold Farxiga , continue bicarb - Diuretics/furosemide  on hold    DVT prophylaxis: heparin  injection 5,000 Units Start: 08/29/23 1400 Code Status:   Code Status: Full Code Family Communication: None present  Status is: Inpatient  Dispo: The patient is from: Home              Anticipated d/c is to: Home               Anticipated d/c date is: 24 to 48 hours              Patient currently not medically stable for discharge  Consultants:  Cardiology  Procedures:  None  Antimicrobials:  None  Subjective: No acute issues or events overnight, headache improving but not resolved otherwise denies nausea vomiting diarrhea constipation fevers chills chest pain  Objective: Vitals:   09/01/23 0517 09/01/23 0617 09/01/23 0717 09/01/23 0725  BP: (!) 163/99 (!) 183/108 (!) 171/104 (!) 173/107  Pulse:      Resp:      Temp:      TempSrc:      SpO2:      Weight:      Height:        Intake/Output Summary (Last 24 hours) at 09/01/2023 0807 Last data filed at 09/01/2023 0520 Gross per 24 hour  Intake 538.22 ml  Output 850 ml  Net -311.78 ml   Filed Weights   08/30/23 0544 08/31/23 0301 09/01/23 0516  Weight: 87.1 kg 88.6 kg 88.3 kg    Examination:  General:  Pleasantly resting in bed, No acute distress. HEENT:  Normocephalic atraumatic.  Sclerae nonicteric, noninjected.  Extraocular movements intact bilaterally. Neck:  Without mass or deformity.  Trachea is midline. Lungs:  Clear to auscultate bilaterally without rhonchi, wheeze, or rales. Heart:  Regular rate and rhythm.  Without murmurs, rubs, or gallops. Abdomen:  Soft, nontender, nondistended.  Without guarding or rebound. Extremities: Without cyanosis, clubbing, edema, or obvious deformity. Skin:  Warm and dry, no erythema.  Data Reviewed: I have personally reviewed following labs and imaging studies  CBC: Recent Labs  Lab 08/28/23 1655 08/29/23 0448 08/30/23 0501 08/31/23 0427 09/01/23 0537  WBC 8.3 7.2 7.2 7.0 6.0  HGB 10.4* 9.8* 9.3* 9.2* 9.5*  HCT 33.0* 29.7* 28.0* 27.7* 28.9*  MCV 97.6 93.7 91.2 91.7 94.1  PLT 190 183 173 157 148*   Basic Metabolic Panel: Recent Labs  Lab 08/28/23 1655 08/29/23 0448 08/30/23 0501 08/31/23 0427 09/01/23 0537  NA 143 139 138 135 136  K 3.5 3.2* 3.0* 3.9 4.6  CL 113* 108 105 106 106   CO2 19* 19* 23 22 20*  GLUCOSE 94 110* 93 104* 96  BUN 40* 40* 41* 41* 41*  CREATININE 5.03* 4.90* 5.13* 4.96* 4.60*  CALCIUM  7.2* 7.0* 6.6* 6.8* 7.6*   GFR: Estimated Creatinine Clearance: 22.1 mL/min (A) (by C-G formula based on SCr of 4.6 mg/dL (H)). Liver Function Tests: Recent Labs  Lab 08/29/23 0448  AST 20  ALT 15  ALKPHOS 78  BILITOT 0.7  PROT 6.6  ALBUMIN 3.6    No results found for this or any previous visit (from the past 240 hours).   Radiology Studies: No results found.  Scheduled Meds:  amLODipine   10 mg Oral Daily   aspirin  EC  81 mg Oral Daily   atorvastatin   20 mg Oral Daily   doxazosin  2 mg Oral Daily   heparin   5,000 Units Subcutaneous Q8H   hydrALAZINE   75 mg Oral Q12H   isosorbide  mononitrate  60 mg Oral Daily   polyethylene glycol  17 g Oral BID   sertraline   50 mg Oral Daily   sodium bicarbonate   650 mg Oral BID   sodium chloride  flush  3 mL Intravenous Q12H   Continuous Infusions:  nitroGLYCERIN  Stopped (08/31/23 1047)     LOS: 4 days   Time spent:  Haydee Lipa, DO [facility]  If 7PM-7AM, please contact night-coverage [email or web address]  09/01/2023, 8:07 AM

## 2023-09-01 NOTE — Progress Notes (Signed)
 Patient is alert and oriented x4. Denied pain. Nitroglycerin  drip was stopped on day shift yesterday. Map goal is 110-120. Last MAP was 119-right under goal. Patient is anxious about nitroglycerin  drip as he has claimed that it made him dizzy and sleepy. Patient denies additional needs.

## 2023-09-02 ENCOUNTER — Other Ambulatory Visit (HOSPITAL_COMMUNITY): Payer: Self-pay

## 2023-09-02 DIAGNOSIS — I169 Hypertensive crisis, unspecified: Secondary | ICD-10-CM | POA: Diagnosis not present

## 2023-09-02 DIAGNOSIS — I2489 Other forms of acute ischemic heart disease: Secondary | ICD-10-CM

## 2023-09-02 DIAGNOSIS — I5033 Acute on chronic diastolic (congestive) heart failure: Secondary | ICD-10-CM | POA: Diagnosis not present

## 2023-09-02 DIAGNOSIS — I161 Hypertensive emergency: Secondary | ICD-10-CM | POA: Diagnosis not present

## 2023-09-02 DIAGNOSIS — R7989 Other specified abnormal findings of blood chemistry: Secondary | ICD-10-CM | POA: Diagnosis not present

## 2023-09-02 LAB — TROPONIN I (HIGH SENSITIVITY)
Troponin I (High Sensitivity): 274 ng/L (ref <18)
Troponin I (High Sensitivity): 274 ng/L (ref <18)

## 2023-09-02 LAB — CBC
HCT: 27.1 % — ABNORMAL LOW (ref 39.0–52.0)
Hemoglobin: 9 g/dL — ABNORMAL LOW (ref 13.0–17.0)
MCH: 31.1 pg (ref 26.0–34.0)
MCHC: 33.2 g/dL (ref 30.0–36.0)
MCV: 93.8 fL (ref 80.0–100.0)
Platelets: 129 10*3/uL — ABNORMAL LOW (ref 150–400)
RBC: 2.89 MIL/uL — ABNORMAL LOW (ref 4.22–5.81)
RDW: 13.7 % (ref 11.5–15.5)
WBC: 5.5 10*3/uL (ref 4.0–10.5)
nRBC: 0 % (ref 0.0–0.2)

## 2023-09-02 LAB — BASIC METABOLIC PANEL WITH GFR
Anion gap: 9 (ref 5–15)
BUN: 39 mg/dL — ABNORMAL HIGH (ref 6–20)
CO2: 19 mmol/L — ABNORMAL LOW (ref 22–32)
Calcium: 7.9 mg/dL — ABNORMAL LOW (ref 8.9–10.3)
Chloride: 107 mmol/L (ref 98–111)
Creatinine, Ser: 4.78 mg/dL — ABNORMAL HIGH (ref 0.61–1.24)
GFR, Estimated: 14 mL/min — ABNORMAL LOW (ref >60)
Glucose, Bld: 106 mg/dL — ABNORMAL HIGH (ref 70–99)
Potassium: 4.3 mmol/L (ref 3.5–5.1)
Sodium: 135 mmol/L (ref 135–145)

## 2023-09-02 MED ORDER — HYDRALAZINE HCL 100 MG PO TABS
100.0000 mg | ORAL_TABLET | Freq: Three times a day (TID) | ORAL | 1 refills | Status: DC
  Filled 2023-09-02: qty 90, 30d supply, fill #0

## 2023-09-02 MED ORDER — DOXAZOSIN MESYLATE 4 MG PO TABS
4.0000 mg | ORAL_TABLET | Freq: Every day | ORAL | 1 refills | Status: DC
  Filled 2023-09-02: qty 30, 30d supply, fill #0

## 2023-09-02 MED ORDER — ISOSORBIDE MONONITRATE ER 60 MG PO TB24
120.0000 mg | ORAL_TABLET | Freq: Every day | ORAL | Status: DC
  Administered 2023-09-02: 120 mg via ORAL
  Filled 2023-09-02: qty 2

## 2023-09-02 MED ORDER — ISOSORBIDE MONONITRATE ER 120 MG PO TB24
120.0000 mg | ORAL_TABLET | Freq: Every day | ORAL | 1 refills | Status: DC
  Filled 2023-09-02: qty 30, 30d supply, fill #0

## 2023-09-02 MED ORDER — AMLODIPINE BESYLATE 10 MG PO TABS
10.0000 mg | ORAL_TABLET | Freq: Every day | ORAL | 1 refills | Status: DC
  Filled 2023-09-02: qty 30, 30d supply, fill #0

## 2023-09-02 MED ORDER — ATORVASTATIN CALCIUM 20 MG PO TABS
20.0000 mg | ORAL_TABLET | Freq: Every day | ORAL | 1 refills | Status: DC
  Filled 2023-09-02: qty 30, 30d supply, fill #0

## 2023-09-02 MED ORDER — ASPIRIN 81 MG PO TBEC
81.0000 mg | DELAYED_RELEASE_TABLET | Freq: Every day | ORAL | 12 refills | Status: AC
  Filled 2023-09-02: qty 30, 30d supply, fill #0

## 2023-09-02 NOTE — TOC CM/SW Note (Signed)
 Transition of Care [facility]) - Inpatient Brief Assessment   Patient Details  Name: Mark Hayden MRN: [id number] Date of Birth: 1975/10/07  Transition of Care [facility]) CM/SW Contact:    Cosimo Diones, RN Phone Number: 09/02/2023, 11:07 AM   Clinical Narrative: Patient presented for hypertensive crisis. Patient has PCP-and states he drives to his appointments. No home needs identified at this time. Patient states he drove to the hospital and will drive himself home. NO further needs identified.    Transition of Care Asessment: Insurance and Status: Insurance coverage has been reviewed Patient has primary care physician: Yes Home environment has been reviewed: reviewed Prior level of function:: independent Prior/Current Home Services: No current home services Social Drivers of Health Review: SDOH reviewed no interventions necessary Readmission risk has been reviewed: Yes Transition of care needs: no transition of care needs at this time

## 2023-09-02 NOTE — Progress Notes (Addendum)
 Progress Note  Patient Name: Mark Hayden Date of Encounter: 09/02/2023 [facility] Cardiologist: Alwin Baars, DO   Interval Summary   Patient states that he feels okay, mainly fatigued  Denied any headaches, lightheadedness, dizziness BP has responded well to the medication regimen, will continue Will arrange for close outpatient follow up to continue close monitoring   Vital Signs Vitals:   09/02/23 0500 09/02/23 0736 09/02/23 0757 09/02/23 0950  BP: (!) 156/84 (!) 149/67 (!) 160/90 (!) 157/90  Pulse: 90 (!) 101 98 100  Resp: 18  18   Temp: 98 F (36.7 C) 98 F (36.7 C) 98.3 F (36.8 C)   TempSrc: Oral Oral Oral   SpO2:  100% 100% 98%  Weight: 85.3 kg     Height:        Intake/Output Summary (Last 24 hours) at 09/02/2023 1205 Last data filed at 09/02/2023 0757 Gross per 24 hour  Intake 360 ml  Output 2100 ml  Net -1740 ml      09/02/2023    5:00 AM 09/01/2023    5:16 AM 08/31/2023    3:01 AM  Last 3 Weights  Weight (lbs) 188 lb 0.8 oz 194 lb 11.2 oz 195 lb 4.8 oz  Weight (kg) 85.3 kg 88.315 kg 88.587 kg     Telemetry/ECG  Sinus tachycardia, HR 100s - Personally Reviewed  Physical Exam  GEN: No acute distress.   Neck: No JVD Cardiac: RRR, no murmurs, rubs, or gallops.  Respiratory: Clear to auscultation bilaterally. GI: Soft, nontender, non-distended  MS: No edema  Assessment & Plan  Mark Hayden is a 48 y.o. male with a hx of uncontrolled HTN, HFpEF (EF = 60-65%), severe LVH, CKD4, OSA and celiac stenosis who is being seen for hypertension, elevated troponins    Hypertensive emergency Elevated troponin levels Presented with BP as high as 249/149 Reports chest pain, shortness of breath x 3 days Reports noncompliance with medication Reports GI upset with CoReg  Options for antihypertensives limited due to renal dysfunction Troponin levels 201 > 213  Echo this admission showed: EF 60-65%, no LV RWMA, moderate to severe LVH,  normal RV function, increased RV septal thickness, biatrial enlargement, mild MR, normal IVC Overall seems to be receiving better control over BP  Current meds: amlodipine  10 mg daily, doxazosin 4 mg daily, hydralazine  100 mg TID, Imdur  120 mg daily Scheduled close outpatient follow up for general cardiology 6/27   Chronic HFpEF Severe LVH Presented with shortness of breath BNP 631, in the setting of CKD stage IV Home meds: Hydralazine  50 mg TID + Imdur  60 mg daily, Farxiga  10 mg daily, Coreg  37.5 mg BID, torsemide  20 mg daily + K supplementation Echo from 12/2022: EF 60 to 65%, moderate LVH, G2 DD, normal RV function, mildly elevated PASP, biatrial enlargement, mild MR, normal IVC Echo this admission showed: EF 60-65%, no LV RWMA, moderate to severe LVH, normal RV function, increased RV septal thickness, biatrial enlargement, mild MR, normal IVC Medications as above Plan for outpatient cardiac MRI Overall euvolemic, creatinine remains elevated   Per primary Celiac stenosis AKI on CKD stage IV Depression   For questions or updates, please contact [facility] Please consult [email or web address] for contact info under       Signed, Jiles Mote, PA-C   ADDENDUM:   Patient seen and examined with Jiles Mote, PA-C I personally taken a history, examined the patient, reviewed relevant notes,  laboratory data / imaging studies.  I performed a  substantive portion of this encounter and formulated the important aspects of the plan.  I agree with the APP's note, impression, and recommendations; however, I have edited the note to reflect changes or salient points.   Patient seen and examined at bedside. Denies anginal chest pain or heart failure symptoms. Currently being scheduled for discharge later today  PHYSICAL EXAM: Today's Vitals   09/02/23 0500 09/02/23 0736 09/02/23 0757 09/02/23 0950  BP: (!) 156/84 (!) 149/67 (!) 160/90 (!) 157/90  Pulse: 90 (!) 101 98 100   Resp: 18  18   Temp: 98 F (36.7 C) 98 F (36.7 C) 98.3 F (36.8 C)   TempSrc: Oral Oral Oral   SpO2:  100% 100% 98%  Weight: 85.3 kg     Height:      PainSc:  0-No pain     Body mass index is 27.77 kg/m.   Net IO Since Admission: -3,989.58 mL [09/02/23 1205]  Filed Weights   08/31/23 0301 09/01/23 0516 09/02/23 0500  Weight: 88.6 kg 88.3 kg 85.3 kg    Physical Exam  Constitutional:  Age appropriate, hemodynamically stable, no acute distress.   Neck: No JVD present.  Cardiovascular: Normal rate, regular rhythm, S1 normal and S2 normal. Exam reveals no gallop and no friction rub.  No murmur heard. Pulmonary/Chest: Breath sounds normal. He has no wheezes. He has no rales. He exhibits no tenderness.  Abdominal: Soft. Bowel sounds are normal. He exhibits no distension. There is no abdominal tenderness.  Musculoskeletal:        General: No tenderness or edema.  Neurological: He is alert and oriented to person, place, and time.  Skin: Skin is warm and dry.   EKG: (personally reviewed by me) Normal sinus rhythm, 99 bpm, LVH per voltage criteria, ST-T changes likely secondary to repolarization but lateral ischemia cannot be ruled out, prolonged QT  Telemetry: (personally reviewed by me) Telemetry discontinued   Impression:  Hypertensive emergency. Demand ischemia. Chronic HFpEF. Left ventricular hypertrophy. Acute kidney injury on chronic kidney disease stage IV  Recommendations:  Cardiology consulted for evaluation of hypertensive emergency and elevated troponins. It was felt that his symptoms were likely secondary to demand ischemia as he presented with a blood pressure of 249/149 along with chest pain and shortness of breath for 3 days. Blood pressure medications have been uptitrated as discussed above. Still needs further titration of medical therapy which can be done as outpatient. Currently denies anginal chest pain or heart failure symptoms. Carvedilol  has caused  GI upset. History of noncompliance. Uptitration of GDMT has been difficult secondary to renal function. As outpatient may consider transitioning from amlodipine  to Procardia if additional blood pressure medication changes are warranted.  Other options include central acting agents as well. Patient also has known moderate to severe left ventricular hypertrophy with repolarization abnormality on the surface ECG.  Would benefit from a cardiac MRI to rule out infiltrative cardiomyopathy or HCM.  Will arrange cardiac MRI as outpatient. As part of today's visit reviewed the last progress note from Dr. Chancy Comber 09/01/2023, EKG September 01, 2023, vital signs, strict I's and O's with last 24 hours, Labs September 02, 2023, echocardiogram August 29, 2023, prescription drug management, review of medications, outpatient follow-up created, coordination of care.  Further recommendations to follow as the case evolves.   This note was created using a voice recognition software as a result there may be grammatical errors inadvertently enclosed that do not reflect the nature of this encounter. Every attempt is  made to correct such errors.   Olinda Bertrand, DO, [facility]  [address] [place], [place] [address] Pager: [phone] Office: [phone] 09/02/2023 12:05 PM

## 2023-09-02 NOTE — Progress Notes (Signed)
 Explained discharge instructions to patient. Reviewed follow up appointment and next medication administration times. Also reviewed education. Patient verbalized having an understanding for instructions given. All belongings are in the patient's possession. Awaiting TOC meds. IV and telemetry were removed. CCMD was notified. Patient is requesting to speak with a SW. Made SW aware. No other needs verbalized. Will discharge once he's spoken with SW. Patient will be driving himself home. No restrictions verbalized when checked with patient's RN or according to the AVS.

## 2023-09-02 NOTE — Progress Notes (Signed)
 CSW received consult for patient. Patient requested resources to help with rental assistance.CSW spoke with patient at bedside. CSW offered patient [facility] . Patient accepted. All questions answered. No further questions reported at this time.

## 2023-09-02 NOTE — Plan of Care (Signed)

## 2023-09-02 NOTE — Discharge Summary (Signed)
 Physician Discharge Summary  Mark Hayden XBJ:[id number] DOB: [birth year] DOA: 08/28/2023  PCP: Ether Hercules, MD  Admit date: 08/28/2023 Discharge date: 09/02/2023  Admitted From: Home Disposition: Home  Recommendations for Outpatient Follow-up:  Follow up with PCP in 1-2 weeks Follow-up with cardiology as scheduled  Home Health: None Equipment/Devices: None  Discharge Condition: Stable CODE STATUS: Full Diet recommendation: Low-salt low-fat low-carb renal diet  Brief/Interim Summary: Mark Hayden is a pleasant 48 y.o. male with medical history significant for hypertension, sleep apnea, CKD stage IV, and chronic HFpEF who presents with orthopnea and nonproductive cough - in the ED patient found to be volume overloaded/orthopnea with profound hypertension.  Hospitalist called for admission.  Patient admitted as above in the setting of hypertensive emergency with elevated troponin complicated by noncompliance.  Patient was initially placed on nitroglycerin  drip which ultimately was weaned off as patient's p.o. medications were resumed and titrated appropriately.  Carvedilol  was discontinued due to intolerance and replaced by Cardura which patient tolerated quite well.  At this time patient continues on amlodipine , doxazosin, hydralazine , isosorbide , torsemide  with profoundly improved symptoms and blood pressure readings.  Patient otherwise stable and agreeable for discharge home, close follow-up with PCP and cardiology as discussed.    Discharge Diagnoses:  Principal Problem:   Hypertensive crisis Active Problems:   CKD (chronic kidney disease) stage 4, GFR 15-29 ml/min (HCC)   Acute on chronic heart failure with preserved ejection fraction (HFpEF) (HCC)   Elevated troponin   Demand ischemia (HCC)   Hypertensive emergency   Hypertensive emergency, resolved  Elevated troponin, ACS ruled out Medication nonadherence - Troponin likely elevated in the  setting of hypertensive emergency with endorgan damage, not suggestive of ACS -downtrending appropriately - Patient will likely need cardiac MRI at some point per cardiology - Renal US  last fall WNL   Acute on chronic HFpEF Hypokalemia - Resume home medications as below   AKI on CKD4  Metabolic acidosis - Baseline creatinine around 4.5, currently elevated in the setting of diuresis, 5.1 - Resume home medications as below  Discharge Instructions  Discharge Instructions     (HEART FAILURE PATIENTS) Call MD:  Anytime you have any of the following symptoms: 1) 3 pound weight gain in 24 hours or 5 pounds in 1 week 2) shortness of breath, with or without a dry hacking cough 3) swelling in the hands, feet or stomach 4) if you have to sleep on extra pillows at night in order to breathe.   Complete by: As directed    Call MD for:  difficulty breathing, headache or visual disturbances   Complete by: As directed    Call MD for:  extreme fatigue   Complete by: As directed    Call MD for:  hives   Complete by: As directed    Call MD for:  persistant dizziness or light-headedness   Complete by: As directed    Call MD for:  persistant nausea and vomiting   Complete by: As directed    Call MD for:  severe uncontrolled pain   Complete by: As directed    Call MD for:  temperature >100.4   Complete by: As directed    Diet - low sodium heart healthy   Complete by: As directed    Increase activity slowly   Complete by: As directed       Allergies as of 09/02/2023       Reactions   Fish Allergy Anaphylaxis, Itching   Shellfish Allergy Anaphylaxis, Itching  seafood        Medication List     STOP taking these medications    carvedilol  12.5 MG tablet Commonly known as: COREG        TAKE these medications    amLODipine  10 MG tablet Commonly known as: NORVASC  Take 1 tablet (10 mg total) by mouth daily.   aspirin  EC 81 MG tablet Take 1 tablet (81 mg total) by mouth daily.  Swallow whole.   atorvastatin  20 MG tablet Commonly known as: LIPITOR Take 1 tablet (20 mg total) by mouth daily.   Blood Pressure Monitor Devi Use to check blood pressure daily   dapagliflozin  propanediol 10 MG Tabs tablet Commonly known as: FARXIGA  Take 1 tablet (10 mg total) by mouth daily.   doxazosin 4 MG tablet Commonly known as: CARDURA Take 1 tablet (4 mg total) by mouth daily.   hydrALAZINE  100 MG tablet Commonly known as: APRESOLINE  Take 1 tablet (100 mg total) by mouth 3 (three) times daily. What changed:  medication strength how much to take when to take this   isosorbide  mononitrate 120 MG 24 hr tablet Commonly known as: IMDUR  Take 1 tablet (120 mg total) by mouth daily. What changed:  medication strength how much to take when to take this   sertraline  50 MG tablet Commonly known as: Zoloft  Take 1 tablet (50 mg total) by mouth daily.   sodium bicarbonate  650 MG tablet Take 1 tablet (650 mg total) by mouth 2 (two) times daily.   torsemide  10 MG tablet Commonly known as: DEMADEX  Take 1 tablet (10 mg total) by mouth daily as needed (weight gain).        Allergies  Allergen Reactions   Fish Allergy Anaphylaxis and Itching   Shellfish Allergy Anaphylaxis and Itching    seafood    Consultations: Cardiology  Procedures/Studies: ECHOCARDIOGRAM COMPLETE Result Date: 08/29/2023    ECHOCARDIOGRAM REPORT   Patient Name:   [facility] Date of Exam: 08/29/2023 Medical Rec #:  [id number]             Height:       69.0 in Accession #:    [id number]            Weight:       206.0 lb Date of Birth:  12-May-1975            BSA:          2.092 m Patient Age:    47 years              BP:           124/96 mmHg Patient Gender: M                     HR:           93 bpm. Exam Location:  Inpatient Procedure: 2D Echo, Cardiac Doppler, Color Doppler and Strain Analysis (Both            Spectral and Color Flow Doppler were utilized during procedure). Indications:     Elevated Troponin  History:        Patient has prior history of Echocardiogram examinations, most                 recent 01/09/2023.  Sonographer:    Griselda Lederer Referring Phys: [id number] TIMOTHY S OPYD IMPRESSIONS  1. Left ventricular ejection fraction, by estimation, is 60 to 65%. The left ventricle has normal function. The left ventricle has no  regional wall motion abnormalities. Moderate to severe concentric left ventricular hypertrophy. Left ventricular diastolic parameters are indeterminate. The average left ventricular global longitudinal strain is -20.3 %. The global longitudinal strain is normal.  2. Right ventricular systolic function is normal. The right ventricular size is mildly enlarged. Increased septal RV thickness.  3. Left atrial size was moderately dilated.  4. Right atrial size was mildly dilated.  5. The mitral valve is normal in structure. Mild mitral valve regurgitation. No evidence of mitral stenosis.  6. The aortic valve is tricuspid. There is mild calcification of the aortic valve. Aortic valve regurgitation is not visualized. No aortic stenosis is present.  7. The inferior vena cava is normal in size with greater than 50% respiratory variability, suggesting right atrial pressure of 3 mmHg. Comparison(s): No significant change from prior study. Conclusion(s)/Recommendation(s): Otherwise normal echocardiogram, with minor abnormalities described in the report. FINDINGS  Left Ventricle: Left ventricular ejection fraction, by estimation, is 60 to 65%. The left ventricle has normal function. The left ventricle has no regional wall motion abnormalities. The average left ventricular global longitudinal strain is -20.3 %. Strain was performed and the global longitudinal strain is normal. 3D ejection fraction reviewed and evaluated as part of the interpretation. Alternate measurement of EF is felt to be most reflective of LV function. The left ventricular internal cavity size was normal in size.  Moderate to severe concentric left ventricular hypertrophy. Left ventricular diastolic parameters are indeterminate. Right Ventricle: The right ventricular size is mildly enlarged. Increased septal RV thickness. Right ventricular systolic function is normal. Left Atrium: Left atrial size was moderately dilated. Right Atrium: Right atrial size was mildly dilated. Pericardium: There is no evidence of pericardial effusion. Mitral Valve: The mitral valve is normal in structure. Mild mitral valve regurgitation. No evidence of mitral valve stenosis. Tricuspid Valve: The tricuspid valve is normal in structure. Tricuspid valve regurgitation is trivial. No evidence of tricuspid stenosis. Aortic Valve: The aortic valve is tricuspid. There is mild calcification of the aortic valve. Aortic valve regurgitation is not visualized. No aortic stenosis is present. Pulmonic Valve: The pulmonic valve was not well visualized. Pulmonic valve regurgitation is mild to moderate. No evidence of pulmonic stenosis. Aorta: The aortic root, ascending aorta, aortic arch and descending aorta are all structurally normal, with no evidence of dilitation or obstruction. Venous: The inferior vena cava is normal in size with greater than 50% respiratory variability, suggesting right atrial pressure of 3 mmHg. IAS/Shunts: The atrial septum is grossly normal. Additional Comments: There is a small pleural effusion in both left and right lateral regions.  LEFT VENTRICLE PLAX 2D LVIDd:         5.00 cm   Diastology LVIDs:         3.30 cm   LV e' medial:    5.59 cm/s LV PW:         1.50 cm   LV E/e' medial:  14.7 LV IVS:        1.60 cm   LV e' lateral:   7.87 cm/s LVOT diam:     2.20 cm   LV E/e' lateral: 10.5 LVOT Area:     3.80 cm                          2D Longitudinal Strain                          2D Strain  GLS (A4C):   -24.9 %                          2D Strain GLS (A3C):   -19.0 %                          2D Strain GLS (A2C):   -17.0 %                           2D Strain GLS Avg:     -20.3 %                           3D Volume EF                          LV 3D EDV:   194.20 ml                          LV 3D ESV:   75.85 ml RIGHT VENTRICLE             IVC RV Basal diam:  4.70 cm     IVC diam: 1.00 cm RV S prime:     15.00 cm/s TAPSE (M-mode): 2.2 cm LEFT ATRIUM             Index LA diam:        5.00 cm 2.39 cm/m LA Vol (A2C):   71.4 ml 34.13 ml/m LA Vol (A4C):   82.5 ml 39.43 ml/m LA Biplane Vol: 83.0 ml 39.67 ml/m                        PULMONIC VALVE AORTA                 PR End Diast Vel: 10.76 msec Ao Root diam: 3.50 cm Ao Asc diam:  3.10 cm MITRAL VALVE MV Area (PHT): 4.44 cm    SHUNTS MV Decel Time: 171 msec    Systemic Diam: 2.20 cm MV E velocity: 82.40 cm/s MV A velocity: 63.70 cm/s MV E/A ratio:  1.29 Sheryle Donning MD Electronically signed by Sheryle Donning MD Signature Date/Time: 08/29/2023/5:02:22 PM    Final    CT Head Wo Contrast Result Date: 08/28/2023 CLINICAL DATA:  Headache EXAM: CT HEAD WITHOUT CONTRAST TECHNIQUE: Contiguous axial images were obtained from the base of the skull through the vertex without intravenous contrast. RADIATION DOSE REDUCTION: This exam was performed according to the departmental dose-optimization program which includes automated exposure control, adjustment of the mA and/or kV according to patient size and/or use of iterative reconstruction technique. COMPARISON:  09/07/2019 FINDINGS: Brain: No acute intracranial abnormality. Specifically, no hemorrhage, hydrocephalus, mass lesion, acute infarction, or significant intracranial injury. Vascular: No hyperdense vessel or unexpected calcification. Skull: No acute calvarial abnormality. Sinuses/Orbits: No acute findings Other: None IMPRESSION: No acute intracranial abnormality. Electronically Signed   By: Janeece Mechanic M.D.   On: 08/28/2023 22:21   DG Chest 2 View Result Date: 08/28/2023 CLINICAL DATA:  Shortness of breath for 3 days. EXAM: CHEST - 2  VIEW COMPARISON:  January 07, 2023. FINDINGS: Stable cardiomediastinal silhouette. Minimal bibasilar subsegmental atelectasis is noted. Bony thorax is unremarkable. IMPRESSION: Minimal bibasilar subsegmental atelectasis. Electronically Signed   By: Rosalene Colon M.D.   On:  08/28/2023 17:29     Subjective: No acute issues or events overnight denies nausea vomiting diarrhea constipation headache fevers chills or chest pain   Discharge Exam: Vitals:   09/02/23 0757 09/02/23 0950  BP: (!) 160/90 (!) 157/90  Pulse: 98 100  Resp: 18   Temp: 98.3 F (36.8 C)   SpO2: 100% 98%   Vitals:   09/02/23 0500 09/02/23 0736 09/02/23 0757 09/02/23 0950  BP: (!) 156/84 (!) 149/67 (!) 160/90 (!) 157/90  Pulse: 90 (!) 101 98 100  Resp: 18  18   Temp: 98 F (36.7 C) 98 F (36.7 C) 98.3 F (36.8 C)   TempSrc: Oral Oral Oral   SpO2:  100% 100% 98%  Weight: 85.3 kg     Height:        General: Pt is alert, awake, not in acute distress Cardiovascular: RRR, S1/S2 +, no rubs, no gallops Respiratory: CTA bilaterally, no wheezing, no rhonchi Abdominal: Soft, NT, ND, bowel sounds + Extremities: no edema, no cyanosis    The results of significant diagnostics from this hospitalization (including imaging, microbiology, ancillary and laboratory) are listed below for reference.     Microbiology: No results found for this or any previous visit (from the past 240 hours).   Labs: BNP (last 3 results) Recent Labs    11/12/22 1616 01/07/23 1300 08/28/23 1655  BNP 303.9* 647.2* 631.6*   Basic Metabolic Panel: Recent Labs  Lab 08/29/23 0448 08/30/23 0501 08/31/23 0427 09/01/23 0537 09/02/23 0145  NA 139 138 135 136 135  K 3.2* 3.0* 3.9 4.6 4.3  CL 108 105 106 106 107  CO2 19* 23 22 20* 19*  GLUCOSE 110* 93 104* 96 106*  BUN 40* 41* 41* 41* 39*  CREATININE 4.90* 5.13* 4.96* 4.60* 4.78*  CALCIUM  7.0* 6.6* 6.8* 7.6* 7.9*   Liver Function Tests: Recent Labs  Lab 08/29/23 0448  AST 20   ALT 15  ALKPHOS 78  BILITOT 0.7  PROT 6.6  ALBUMIN 3.6   No results for input(s): LIPASE, AMYLASE in the last 168 hours. No results for input(s): AMMONIA in the last 168 hours. CBC: Recent Labs  Lab 08/29/23 0448 08/30/23 0501 08/31/23 0427 09/01/23 0537 09/02/23 0145  WBC 7.2 7.2 7.0 6.0 5.5  HGB 9.8* 9.3* 9.2* 9.5* 9.0*  HCT 29.7* 28.0* 27.7* 28.9* 27.1*  MCV 93.7 91.2 91.7 94.1 93.8  PLT 183 173 157 148* 129*   Cardiac Enzymes: No results for input(s): CKTOTAL, CKMB, CKMBINDEX, TROPONINI in the last 168 hours. BNP: Invalid input(s): POCBNP CBG: No results for input(s): GLUCAP in the last 168 hours. D-Dimer No results for input(s): DDIMER in the last 72 hours. Hgb A1c No results for input(s): HGBA1C in the last 72 hours. Lipid Profile No results for input(s): CHOL, HDL, LDLCALC, TRIG, CHOLHDL, LDLDIRECT in the last 72 hours. Thyroid function studies No results for input(s): TSH, T4TOTAL, T3FREE, THYROIDAB in the last 72 hours.  Invalid input(s): FREET3 Anemia work up No results for input(s): VITAMINB12, FOLATE, FERRITIN, TIBC, IRON , RETICCTPCT in the last 72 hours. Urinalysis    Component Value Date/Time   COLORURINE YELLOW 01/07/2023 1530   APPEARANCEUR CLEAR 01/07/2023 1530   LABSPEC 1.017 01/07/2023 1530   PHURINE 5.0 01/07/2023 1530   GLUCOSEU 50 (A) 01/07/2023 1530   HGBUR MODERATE (A) 01/07/2023 1530   BILIRUBINUR NEGATIVE 01/07/2023 1530   KETONESUR NEGATIVE 01/07/2023 1530   PROTEINUR 100 (A) 01/07/2023 1530   NITRITE NEGATIVE 01/07/2023 1530   LEUKOCYTESUR  NEGATIVE 01/07/2023 1530   Sepsis Labs Recent Labs  Lab 08/30/23 0501 08/31/23 0427 09/01/23 0537 09/02/23 0145  WBC 7.2 7.0 6.0 5.5   Microbiology No results found for this or any previous visit (from the past 240 hours).   Time coordinating discharge: Over 30 minutes  SIGNED:   Haydee Lipa, DO [facility] 09/02/2023, 4:42 PM Pager   If 7PM-7AM, please contact night-coverage [email or web address]

## 2023-09-12 NOTE — Progress Notes (Deleted)
 Cardiology Office Note    Patient Name: Mark Hayden Date of Encounter: 09/12/2023  Primary Care Provider:  [name] [name], MD Primary Cardiologist:  Ria Commander, DO Primary Electrophysiologist: None   Past Medical History    Past Medical History:  Diagnosis Date   CHF (congestive heart failure) (HCC)    Hypertension    Hypertensive emergency 08/20/2022   Pneumonia    Sleep apnea    Suicidal thoughts 09/08/2019    History of Present Illness  Mark Hayden is a 48 y.o. male with a PMH of HFpEF, CKD stage IV, malignant hypertension, nonrheumatic MVR, celiac artery stenosis, OSA who presents today for posthospital follow-up.  [name] was seen initially in 07/2022 in the ED for complaint of shortness of breath and chest discomfort.  BP of 233/148 and creatinine was greater than 4.  He was started on nitro drip for hypertensive emergency BNP was elevated along with troponin.  2D echo was completed showing EF of 45-50% with global hypokinesis and severe LVH with moderate to severe MR.  He was seen for [facility] clinic visit on 6/24 and was volume up and was referred to advanced heart failure clinic.  He was seen by Harlene Gainer on 10/01/2022 and was started on GDMT with Jardiance and continued on hydralazine , Coreg , and Lasix  with no further addition due to CKD.  He was admitted on 10/2022 with pneumonia and required thoracentesis.  He was seen in follow-up on 11/12/2022 had cardiac MRI pending for further evaluation to rule out HOCM, amyloid disease.  He was readmitted on 01/07/2023 with shortness of breath and hypertensive emergency.  He had repeat 2D echo completed showing improvement to EF of 60 to 65% with mild MR.  He reported medication nonadherence and cardiology was consulted due to elevated troponins and was treated with torsemide  as well as hydralazine .  He was also found to have severe celiac artery stenosis and was consulted by [facility].    He was  admitted on 08/28/2023 with shortness of breath x 3 days.  He was found to have elevated BNP and troponins.  He endorsed centrally located chest pain that was sharp in nature.  He endorsed orthopnea and inability to lay recumbent while sleeping.  He reported poor med compliance due to carvedilol  causing stomach upset.  This was discontinued and added to intolerance Cardura  added.  BNP was 631 with troponin's 201 -> 213.  CXR showed minimal bibasilar atelectasis without pulmonary edema.  CT head was negative for intracranial process.  ECG shows sinus tachycardia with marked LVH and repolarization abnormality secondary to LVH.  2D echo was repeated showing preserved EF of 60 to 65% with no LV RWMA and moderate to severe LVH with RV septal thickness and biatrial enlargement with mild MR.  He was discharged with amlodipine  10 mg, doxazosin  4 mg, hydralazine  100 mg 3 times daily, Imdur  120 mg daily with plan for cardiac MRI outpatient.  Patient denies chest pain, palpitations, dyspnea, PND, orthopnea, nausea, vomiting, dizziness, syncope, edema, weight gain, or early satiety.   Discussed the use of AI scribe software for clinical note transcription with the patient, who gave verbal consent to proceed.  History of Present Illness    ***Notes: Patient will need MRI   Review of Systems  Please see the history of present illness.    All other systems reviewed and are otherwise negative except as noted above.  Physical Exam    Wt Readings from Last 3 Encounters:  09/02/23 188 lb  0.8 oz (85.3 kg)  06/07/23 206 lb (93.4 kg)  01/23/23 203 lb 6.4 oz (92.3 kg)   CD:Uyzmz were no vitals filed for this visit.,There is no height or weight on file to calculate BMI. GEN: Well nourished, well developed in no acute distress Neck: No JVD; No carotid bruits Pulmonary: Clear to auscultation without rales, wheezing or rhonchi  Cardiovascular: Normal rate. Regular rhythm. Normal S1. Normal S2.   Murmurs: There is  no murmur.  ABDOMEN: Soft, non-tender, non-distended EXTREMITIES:  No edema; No deformity   EKG/LABS/ Recent Cardiac Studies   ECG personally reviewed by me today - ***  Risk Assessment/Calculations:   {Does this patient have ATRIAL FIBRILLATION?:[phone]}      Lab Results  Component Value Date   WBC 5.5 09/02/2023   HGB 9.0 (L) 09/02/2023   HCT 27.1 (L) 09/02/2023   MCV 93.8 09/02/2023   PLT 129 (L) 09/02/2023   Lab Results  Component Value Date   CREATININE 4.78 (H) 09/02/2023   BUN 39 (H) 09/02/2023   NA 135 09/02/2023   K 4.3 09/02/2023   CL 107 09/02/2023   CO2 19 (L) 09/02/2023   Lab Results  Component Value Date   CHOL 152 06/11/2023   HDL 45.90 06/11/2023   LDLCALC 89 06/11/2023   TRIG 86.0 06/11/2023   CHOLHDL 3 06/11/2023    Lab Results  Component Value Date   HGBA1C 5.3 06/11/2023   Assessment & Plan    Assessment and Plan Assessment & Plan     1.  Malignant hypertension  2.  HFpEF  3.  Nonrheumatic MVR  4.  Celiac artery stenosis      Disposition: Follow-up with Aditya Sabharwal, DO or APP in *** months {Are you ordering a CV Procedure (e.g. stress test, cath, DCCV, TEE, etc)?   Press F2        :[id number]}   Signed, Wyn Raddle, Jackee Shove, NP 09/12/2023, 6:07 PM [facility]

## 2023-09-13 ENCOUNTER — Ambulatory Visit: Attending: Nurse Practitioner | Admitting: Nurse Practitioner

## 2023-09-13 DIAGNOSIS — I774 Celiac artery compression syndrome: Secondary | ICD-10-CM

## 2023-09-13 DIAGNOSIS — I10 Essential (primary) hypertension: Secondary | ICD-10-CM

## 2023-09-13 DIAGNOSIS — I340 Nonrheumatic mitral (valve) insufficiency: Secondary | ICD-10-CM

## 2023-09-13 DIAGNOSIS — I5022 Chronic systolic (congestive) heart failure: Secondary | ICD-10-CM

## 2023-09-26 NOTE — Telephone Encounter (Signed)
 Error

## 2023-09-28 DIAGNOSIS — Z419 Encounter for procedure for purposes other than remedying health state, unspecified: Secondary | ICD-10-CM | POA: Diagnosis not present

## 2023-10-24 ENCOUNTER — Other Ambulatory Visit (HOSPITAL_COMMUNITY): Payer: Self-pay

## 2023-10-28 ENCOUNTER — Emergency Department (HOSPITAL_COMMUNITY)

## 2023-10-28 ENCOUNTER — Emergency Department (HOSPITAL_COMMUNITY)
Admission: EM | Admit: 2023-10-28 | Discharge: 2023-10-28 | Disposition: A | Discharge status: Home / Self Care | Attending: Emergency Medicine | Admitting: Emergency Medicine

## 2023-10-28 ENCOUNTER — Other Ambulatory Visit: Payer: Self-pay

## 2023-10-28 ENCOUNTER — Encounter (HOSPITAL_COMMUNITY): Payer: Self-pay

## 2023-10-28 DIAGNOSIS — R7989 Other specified abnormal findings of blood chemistry: Secondary | ICD-10-CM | POA: Diagnosis not present

## 2023-10-28 DIAGNOSIS — R079 Chest pain, unspecified: Secondary | ICD-10-CM | POA: Diagnosis not present

## 2023-10-28 DIAGNOSIS — R0602 Shortness of breath: Secondary | ICD-10-CM | POA: Insufficient documentation

## 2023-10-28 DIAGNOSIS — Z7982 Long term (current) use of aspirin: Secondary | ICD-10-CM | POA: Insufficient documentation

## 2023-10-28 DIAGNOSIS — I509 Heart failure, unspecified: Secondary | ICD-10-CM | POA: Diagnosis not present

## 2023-10-28 DIAGNOSIS — Z79899 Other long term (current) drug therapy: Secondary | ICD-10-CM | POA: Insufficient documentation

## 2023-10-28 DIAGNOSIS — I130 Hypertensive heart and chronic kidney disease with heart failure and stage 1 through stage 4 chronic kidney disease, or unspecified chronic kidney disease: Secondary | ICD-10-CM | POA: Insufficient documentation

## 2023-10-28 DIAGNOSIS — N189 Chronic kidney disease, unspecified: Secondary | ICD-10-CM | POA: Diagnosis not present

## 2023-10-28 LAB — CBC
HCT: 28.1 % — ABNORMAL LOW (ref 39.0–52.0)
Hemoglobin: 9.1 g/dL — ABNORMAL LOW (ref 13.0–17.0)
MCH: 30.2 pg (ref 26.0–34.0)
MCHC: 32.4 g/dL (ref 30.0–36.0)
MCV: 93.4 fL (ref 80.0–100.0)
Platelets: 159 K/uL (ref 150–400)
RBC: 3.01 MIL/uL — ABNORMAL LOW (ref 4.22–5.81)
RDW: 13.6 % (ref 11.5–15.5)
WBC: 7.3 K/uL (ref 4.0–10.5)
nRBC: 0 % (ref 0.0–0.2)

## 2023-10-28 LAB — BASIC METABOLIC PANEL WITH GFR
Anion gap: 12 (ref 5–15)
BUN: 30 mg/dL — ABNORMAL HIGH (ref 6–20)
CO2: 17 mmol/L — ABNORMAL LOW (ref 22–32)
Calcium: 6.8 mg/dL — ABNORMAL LOW (ref 8.9–10.3)
Chloride: 112 mmol/L — ABNORMAL HIGH (ref 98–111)
Creatinine, Ser: 4.31 mg/dL — ABNORMAL HIGH (ref 0.61–1.24)
GFR, Estimated: 16 mL/min — ABNORMAL LOW (ref >60)
Glucose, Bld: 123 mg/dL — ABNORMAL HIGH (ref 70–99)
Potassium: 3.5 mmol/L (ref 3.5–5.1)
Sodium: 141 mmol/L (ref 135–145)

## 2023-10-28 LAB — TROPONIN I (HIGH SENSITIVITY)
Troponin I (High Sensitivity): 98 ng/L — ABNORMAL HIGH (ref <18)
Troponin I (High Sensitivity): 91 ng/L — ABNORMAL HIGH (ref <18)

## 2023-10-28 LAB — BRAIN NATRIURETIC PEPTIDE: B Natriuretic Peptide: 397.7 pg/mL — ABNORMAL HIGH (ref 0.0–100.0)

## 2023-10-28 MED ORDER — FUROSEMIDE 20 MG PO TABS
20.0000 mg | ORAL_TABLET | Freq: Once | ORAL | Status: DC
  Filled 2023-10-28: qty 1

## 2023-10-28 MED ORDER — FUROSEMIDE 20 MG PO TABS
40.0000 mg | ORAL_TABLET | Freq: Once | ORAL | Status: AC
  Administered 2023-10-28 (×2): 40 mg via ORAL
  Filled 2023-10-28: qty 2

## 2023-10-28 NOTE — ED Provider Notes (Signed)
 Patient care assumed from previous provider.   Patient care of Shi Blankenship is a 48 y.o. male from previous provider. Please see the original provider note from this emergency department encounter for full history and physical.   Course of Care and my assessment at the time of sign out is detailed in the ED Course below.   Clinical Course as of 10/28/23 9297  Mon Oct 28, 2023  0627 S- cp w/ sob, HO chf Delta trope and dc if flat Non compliant on lasix  [RC]  0657 EKG 12-Lead NSR with normal axis and intervals.  With poor R wave progression.  T WI in 1, aVL consistent with prior EKG [RC]    Clinical Course User Index [RC] Sharyne Darina RAMAN, MD    My reassessment patient is generally well-appearing.  He has no increased work of breathing or supplemental O2 requirement.  Following up delta troponin 91 from 98.  Spoke with Dr. Emil who agrees with discharge.  Discharged in stable condition.  Labs reviewed by myself and considered in medical decision making.  Imaging reviewed by myself and considered in medical decision making. Imaging final read interpreted by radiology.  1. Chest pain, unspecified type   2. Shortness of breath     Discharge   The plan for this patient was discussed with Dr. Emil Share, DO , who voiced agreement and who oversaw evaluation and treatment of this patient.    Sharyne Darina RAMAN, MD 10/28/23 9297    Emil Share, DO 10/28/23 2257

## 2023-10-28 NOTE — ED Triage Notes (Signed)
 Pt c.o central chest pain and sob that started a few hours ago. Pt has hx of CHF and thinks he has fluid build up. Some increased swelling in his ankles, states his socks felt tighter yesterday. Pt tachpneic during triage.

## 2023-10-28 NOTE — Discharge Instructions (Addendum)
 You were seen today for shortness of breath. While you were here we monitored your vitals, preformed a physical exam, and labs and imaging. These were all reassuring and there is no indication for any further testing or intervention in the emergency department at this time.   Things to do:  - Follow up with your primary care provider within the next 1-2 weeks - Follow-up with your cardiologist soon as possible.  Return to the emergency department if you have any new or worsening symptoms including worsening shortness of breath, chest pain, or if you have any other concerns.

## 2023-10-28 NOTE — ED Provider Notes (Signed)
 [place] EMERGENCY DEPARTMENT AT [facility] Provider Note   CSN: [id number] Arrival date & time: 10/28/23  9647     Patient presents with: Chest Pain and Shortness of Breath   Mark Hayden is a 48 y.o. male with history of CHF, hypertension, CKD.  Patient presents to ED for evaluation of shortness of breath.  Reports shortness of breath noticed a few hours prior to arrival.  States chest pain also occurred around this time.  Reports chest pain is left-sided, does not radiate.  Endorsing lightheadedness, dizziness.  Also endorsing leg swelling.  Reports compliance on torsemide  at home however goes on to state that he often forgets to take it.  Denies fevers, nausea or vomiting.  PERC negative.   Chest Pain Associated symptoms: shortness of breath   Shortness of Breath Associated symptoms: chest pain        Prior to Admission medications   Medication Sig Start Date End Date Taking? Authorizing Provider  amLODipine  (NORVASC ) 10 MG tablet Take 1 tablet (10 mg total) by mouth daily. 09/02/23 12/01/23 Yes Lue Elsie BROCKS, MD  aspirin  EC 81 MG tablet Take 1 tablet (81 mg total) by mouth daily. Swallow whole. 09/02/23  Yes Lue Elsie BROCKS, MD  atorvastatin  (LIPITOR) 20 MG tablet Take 1 tablet (20 mg total) by mouth daily. 09/02/23  Yes Lue Elsie BROCKS, MD  dapagliflozin  propanediol (FARXIGA ) 10 MG TABS tablet Take 1 tablet (10 mg total) by mouth daily. 06/07/23  Yes [name] [name], MD  doxazosin  (CARDURA ) 4 MG tablet Take 1 tablet (4 mg total) by mouth daily. 09/02/23  Yes Lue Elsie BROCKS, MD  hydrALAZINE  (APRESOLINE ) 100 MG tablet Take 1 tablet (100 mg total) by mouth 3 (three) times daily. 09/02/23 11/01/23 Yes Lue Elsie BROCKS, MD  isosorbide  mononitrate (IMDUR ) 120 MG 24 hr tablet Take 1 tablet (120 mg total) by mouth daily. 09/02/23  Yes Lue Elsie BROCKS, MD  sertraline  (ZOLOFT ) 50 MG tablet Take 1 tablet (50 mg total) by mouth daily.  06/07/23 06/06/24 Yes [name] [name], MD  sodium bicarbonate  650 MG tablet Take 1 tablet (650 mg total) by mouth 2 (two) times daily. 06/07/23  Yes [name] [name], MD  torsemide  (DEMADEX ) 10 MG tablet Take 1 tablet (10 mg total) by mouth daily as needed (weight gain). 06/07/23  Yes [name] [name], MD    Allergies: Fish allergy and Shellfish allergy    Review of Systems  Respiratory:  Positive for shortness of breath.   Cardiovascular:  Positive for chest pain.  All other systems reviewed and are negative.   Updated Vital Signs BP (!) 150/89   Pulse 76   Temp 98.2 F (36.8 C) (Oral)   Resp (!) 25   SpO2 100%   Physical Exam Vitals and nursing note reviewed.  Constitutional:      General: He is not in acute distress.    Appearance: He is well-developed.  HENT:     Head: Normocephalic and atraumatic.  Eyes:     Conjunctiva/sclera: Conjunctivae normal.  Cardiovascular:     Rate and Rhythm: Normal rate and regular rhythm.     Heart sounds: No murmur heard. Pulmonary:     Effort: Pulmonary effort is normal. No respiratory distress.     Breath sounds: Normal breath sounds.  Abdominal:     Palpations: Abdomen is soft.     Tenderness: There is no abdominal tenderness.  Musculoskeletal:        General: No swelling.  Cervical back: Neck supple.     Right lower leg: No edema.     Left lower leg: No edema.     Comments: No edema to bilateral lower extremities  Skin:    General: Skin is warm and dry.     Capillary Refill: Capillary refill takes less than 2 seconds.  Neurological:     Mental Status: He is alert.  Psychiatric:        Mood and Affect: Mood normal.     (all labs ordered are listed, but only abnormal results are displayed) Labs Reviewed  BASIC METABOLIC PANEL WITH GFR - Abnormal; Notable for the following components:      Result Value   Chloride 112 (*)    CO2 17 (*)    Glucose, Bld 123 (*)    BUN 30 (*)    Creatinine, Ser 4.31 (*)     Calcium  6.8 (*)    GFR, Estimated 16 (*)    All other components within normal limits  CBC - Abnormal; Notable for the following components:   RBC 3.01 (*)    Hemoglobin 9.1 (*)    HCT 28.1 (*)    All other components within normal limits  BRAIN NATRIURETIC PEPTIDE - Abnormal; Notable for the following components:   B Natriuretic Peptide 397.7 (*)    All other components within normal limits  TROPONIN I (HIGH SENSITIVITY) - Abnormal; Notable for the following components:   Troponin I (High Sensitivity) 98 (*)    All other components within normal limits  TROPONIN I (HIGH SENSITIVITY)    EKG: EKG Interpretation Date/Time:  Monday October 28 2023 04:04:55 EDT Ventricular Rate:  86 PR Interval:  150 QRS Duration:  98 QT Interval:  413 QTC Calculation: 494 R Axis:   37  Text Interpretation: Sinus rhythm Probable left atrial enlargement Left ventricular hypertrophy Abnormal T, consider ischemia, lateral leads No significant change since last tracing Confirmed by Emil Share [id number]) on 10/28/2023 5:37:47 AM  Radiology: [facility] Chest Port 1 View Result Date: 10/28/2023 CLINICAL DATA:  Chest pains and shortness of breath. EXAM: PORTABLE CHEST 1 VIEW COMPARISON:  AP Lat chest 08/28/2023 FINDINGS: Stable cardiomegaly. Mild perihilar vascular congestion continues to be seen. There is no overt edema. There are small pleural effusions. No focal airspace infiltrate. The mediastinum is normally outlined.  Thoracic cage is intact. IMPRESSION: Cardiomegaly with mild perihilar vascular congestion and small pleural effusions. No overt edema or focal airspace infiltrate. Similar findings were noted previously. Electronically Signed   By: Francis Quam M.D.   On: 10/28/2023 04:40    Procedures   Medications Ordered in the ED  furosemide  (LASIX ) tablet 40 mg (40 mg Oral Given 10/28/23 0548)    Clinical Course as of 10/28/23 0636  Mon Oct 28, 2023  0627 S- cp w/ sob, HO chf Delta trope and dc if  flat Non compliant on lasix  [RC]    Clinical Course User Index [RC] Sharyne Darina RAMAN, MD   Medical Decision Making Amount and/or Complexity of Data Reviewed Labs: ordered. Radiology: ordered.   This is a 48 year old male who presents to the ED for evaluation.  On arrival, he is afebrile and nontachycardic.  His lung sounds are clear bilaterally and he is not hypoxic.  Abdomen soft and compressible.  No edema to bilateral lower extremities.  Neuroexam at baseline.  Overall nontoxic in appearance.  Patient presents complaining of chest pain and shortness of breath.  Patient is history of CHF, is not  compliant on Lasix .  Labs were collected to include CBC, BMP, BNP, troponin x 2, EKG and chest x-ray.  Patient CBC without leukocytosis, baseline hemoglobin.  Metabolic panel with baseline creatinine, anion gap 12, GFR 16.  Patient BNP elevated 397.7 however not as high as previous visits.  Patient initial troponin 98, delta pending.  EKG is nonischemic.  Chest x-ray showing pulmonary vascular congestion and small pleural effusions noted on prior radiographs.  Patient given 40 mg of Lasix  for diuresis.  At this time, patient delta troponin pending.  Signed out to resident at this time.  Plan of management discussed.  Will follow-up on delta troponin.  Anticipate discharge.   Final diagnoses:  Chest pain, unspecified type  Shortness of breath    ED Discharge Orders     None          Ruthell Lonni FALCON, PA-C 10/28/23 0636    Emil Share, DO 10/28/23 [phone]

## 2023-10-29 DIAGNOSIS — Z419 Encounter for procedure for purposes other than remedying health state, unspecified: Secondary | ICD-10-CM | POA: Diagnosis not present

## 2023-11-29 DIAGNOSIS — Z419 Encounter for procedure for purposes other than remedying health state, unspecified: Secondary | ICD-10-CM | POA: Diagnosis not present

## 2023-12-11 ENCOUNTER — Other Ambulatory Visit (HOSPITAL_COMMUNITY): Payer: Self-pay

## 2023-12-29 DIAGNOSIS — Z419 Encounter for procedure for purposes other than remedying health state, unspecified: Secondary | ICD-10-CM | POA: Diagnosis not present

## 2024-01-01 ENCOUNTER — Telehealth: Payer: Self-pay

## 2024-01-01 NOTE — Telephone Encounter (Signed)
 Pt has been made an appt for tomorrow 2:40 but wanted to make you aware before hand

## 2024-01-02 ENCOUNTER — Encounter: Payer: Self-pay | Admitting: Student in an Organized Health Care Education/Training Program

## 2024-01-02 ENCOUNTER — Other Ambulatory Visit (HOSPITAL_BASED_OUTPATIENT_CLINIC_OR_DEPARTMENT_OTHER): Payer: Self-pay

## 2024-01-02 ENCOUNTER — Ambulatory Visit: Admitting: Student in an Organized Health Care Education/Training Program

## 2024-01-02 VITALS — BP 213/122 | Ht 69.0 in | Wt 175.0 lb

## 2024-01-02 DIAGNOSIS — N184 Chronic kidney disease, stage 4 (severe): Secondary | ICD-10-CM

## 2024-01-02 DIAGNOSIS — I5020 Unspecified systolic (congestive) heart failure: Secondary | ICD-10-CM

## 2024-01-02 DIAGNOSIS — F32A Depression, unspecified: Secondary | ICD-10-CM

## 2024-01-02 DIAGNOSIS — F331 Major depressive disorder, recurrent, moderate: Secondary | ICD-10-CM | POA: Diagnosis not present

## 2024-01-02 DIAGNOSIS — I5032 Chronic diastolic (congestive) heart failure: Secondary | ICD-10-CM

## 2024-01-02 DIAGNOSIS — I10 Essential (primary) hypertension: Secondary | ICD-10-CM

## 2024-01-02 MED ORDER — ISOSORBIDE MONONITRATE ER 120 MG PO TB24
120.0000 mg | ORAL_TABLET | Freq: Every day | ORAL | 3 refills | Status: AC
  Filled 2024-01-02 – 2024-01-04 (×2): qty 90, 90d supply, fill #0

## 2024-01-02 MED ORDER — HYDRALAZINE HCL 100 MG PO TABS
100.0000 mg | ORAL_TABLET | Freq: Three times a day (TID) | ORAL | 5 refills | Status: AC
  Filled 2024-01-02: qty 90, 30d supply, fill #0

## 2024-01-02 MED ORDER — SERTRALINE HCL 50 MG PO TABS
50.0000 mg | ORAL_TABLET | Freq: Every day | ORAL | 3 refills | Status: DC
  Filled 2024-01-02: qty 90, 90d supply, fill #0

## 2024-01-02 MED ORDER — AMLODIPINE BESYLATE 10 MG PO TABS
10.0000 mg | ORAL_TABLET | Freq: Every day | ORAL | 3 refills | Status: AC
  Filled 2024-01-02: qty 90, 90d supply, fill #0

## 2024-01-02 MED ORDER — DOXAZOSIN MESYLATE 4 MG PO TABS
4.0000 mg | ORAL_TABLET | Freq: Every day | ORAL | 3 refills | Status: AC
  Filled 2024-01-02 – 2024-01-04 (×2): qty 90, 90d supply, fill #0

## 2024-01-02 MED ORDER — ATORVASTATIN CALCIUM 20 MG PO TABS
20.0000 mg | ORAL_TABLET | Freq: Every day | ORAL | 3 refills | Status: AC
  Filled 2024-01-02 – 2024-01-04 (×2): qty 90, 90d supply, fill #0

## 2024-01-02 MED ORDER — DAPAGLIFLOZIN PROPANEDIOL 10 MG PO TABS
10.0000 mg | ORAL_TABLET | Freq: Every day | ORAL | 3 refills | Status: DC
  Filled 2024-01-02: qty 90, 90d supply, fill #0

## 2024-01-02 NOTE — Patient Instructions (Signed)
  VISIT SUMMARY: Today, we discussed your ongoing health issues, including your chronic kidney disease, hypertension, heart failure, unintentional weight loss, and social challenges. We reviewed your medications and made plans to help manage your conditions and improve your overall well-being.  YOUR PLAN: -CHRONIC KIDNEY DISEASE STAGE 4: Chronic kidney disease stage 4 means your kidneys are significantly damaged and not working well. We need to control your blood pressure to slow down the progression. We will do blood work to check your kidney function and refer you to [place] for further care. We also discussed the possibility of needing dialysis in the next 6-12 months. A social worker will help you with medications and resources.  -HYPERTENSION: Hypertension, or high blood pressure, is not well controlled and can worsen your kidney function. It is important to take your blood pressure medications regularly. We will refill your medications at [facility] and you should monitor your blood pressure regularly.  -HEART FAILURE WITH MILDLY REDUCED EJECTION FRACTION: Heart failure means your heart is not pumping blood as well as it should, causing fatigue and shortness of breath. Continue taking your heart failure medications and watch for symptoms of fluid buildup, like swelling.  -UNINTENTIONAL WEIGHT LOSS: You have lost weight without trying, which may be due to your kidney disease and poor appetite. A social worker will help you get food assistance and other resources. We will keep an eye on your weight and nutrition.  -SOCIAL DETERMINANTS OF HEALTH: HOUSING AND FOOD INSECURITY: Your housing and food situation is affecting your health. A social worker will help you find housing and food resources, and assist with your Medicaid and food stamp applications. We also discussed applying for disability due to your health conditions.  -DEPRESSION: Your depression is being managed with  sertraline , and you have not reported any recent changes in mood. Continue taking sertraline  as prescribed.  INSTRUCTIONS: Please follow up with the blood work we ordered to check your kidney function. Schedule an appointment with [facility] for nephrology follow-up. Refill your blood pressure medications at [facility] and monitor your blood pressure regularly. A social worker will contact you to assist with housing, food resources, and your Medicaid and food stamp applications. Consider applying for disability due to your health conditions.

## 2024-01-02 NOTE — Progress Notes (Signed)
 Established Patient Office Visit  Subjective   Patient ID: Mark Hayden, male    DOB: Nov 27, 1975  Age: 48 y.o. MRN: [id number]  Chief Complaint  Patient presents with   Acute Visit    Patient was sx with ADHD years ago and was not medicated. Would like to be medicated. He has HF and is concerned about adderall. His ADHD is getting in the way of his work life.     HPI  Discussed the use of AI scribe software for clinical note transcription with the patient, who gave verbal consent to proceed.  History of Present Illness Mark Hayden is a 48 year old male with hypertension and chronic kidney disease who presents for medication management and follow-up.  He experiences ongoing dizziness and 'woosiness' related to his medication, though these symptoms have improved since his last visit. Dizziness persists but is less severe.  He was laid off from work about a month ago due to health-related absences, impacting his housing situation and leading to an impending eviction. He has not seen his heart or kidney doctors recently due to unemployment but wants to resume medical appointments.  He was admitted to the hospital in June for five days due to blood pressure issues and visited the emergency department in August, where he was discharged after a few hours. Since then, he has been more consistent with his medication, although he still experiences significant leg swelling, particularly during his last days of work.  He has experienced significant weight loss, dropping from 215 pounds to 175 pounds over the past year, attributed to a reduced appetite, eating only a sandwich a day and drinking water. He recently applied for food stamps and is awaiting the card. He has Medicaid but is unsure about having a case worker.  He can walk about one block before becoming short of breath and experiences fatigue quickly, especially when carrying heavy items. He describes feeling 'ghastly'  with sweating and exhaustion. He is unsure about his urination frequency, estimating 2-3 times in the morning.  He has been taking his medications, including sertraline  and Farxiga , and has recently refilled them. He has aspirin  at home and plans to check his medication supply through his online chart once he has access to electricity and internet.  He has a history of ADHD diagnosed in childhood, which he feels has impacted his ability to manage his current situation. He is considering applying for disability due to his health conditions, including kidney and heart issues.    Objective:     BP (!) 209/125 (BP Location: Right Arm, Patient Position: Sitting, Cuff Size: Normal)   Ht 5' 9 (1.753 m)   Wt 175 lb (79.4 kg)   BMI 25.84 kg/m   Physical Exam  Gen: Chronically ill-appearing man Neck: No JVD, normal thyroid, no lymphadenopathy Heart: Regular, no murmur Lungs: Unlabored, clear throughout, back has diffuse scratch marks Abd: Soft, nontender, nondistended Ext: Warm, no edema, normal joints Skin: Diffuse xerosis and scratch marks Psych: Appropriate mood and affect, not anxious or depressed appearing, disheveled, speech is linear and thoughts are organized Neuro: Alert, conversational, normal get up and go, normal gait and balance    Assessment & Plan:   Problem List Items Addressed This Visit       High   CKD (chronic kidney disease) stage 4, GFR 15-29 ml/min (HCC) (Chronic)   Progressive CKD, at stage IV and likely to come in to stage V soon.  Will check labs today, last checked about  2 months ago.  He is having a number of symptoms of CKD including weight loss, loss of appetite, dyspnea with exertion.  No volume overload on exam today.  He still making urine.  I first referred him to [facility] in 2024, but he did not follow-up.  He is consulted with nephrology 1 time in the hospital.  Will refer him again to outpatient nephrology, he understands the importance of  establishing this care.  We talked about his very high risk of progressing to ESRD in the coming months.  At this rate it will probably happen in a hospitalization.  I urged him to start taking medications on a regular basis, to control his hypertension which would buy us  more time.  Unfortunately he is at risk for a poor outcome despite our best efforts.  Will check labs today including BMP, Cystatin C, urine microalbumin.  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  Will check PTH.  Renal function is too poor for an ARB it, will continue Farxiga .      Relevant Medications   dapagliflozin  propanediol (FARXIGA ) 10 MG TABS tablet   Other Relevant Orders   Basic metabolic panel with GFR   CBC   Microalbumin / creatinine urine ratio   Cystatin C with Glomerular Filtration Rate, Estimated (eGFR)   PTH, intact (no Ca)   IBC + Ferritin   Ambulatory referral to Nephrology   AMB Referral [facility] Care Management   Hypertension - Primary (Chronic)   Chronic severe asymptomatic hypertension due to advanced CKD.  Exam is reassuring today despite severe elevation in blood pressure.  Etiology is due to poor adherence with medications.  He reports having the medications at home, but his refill dispense history is very sporadic.  I have refilled the Imdur , hydralazine , doxazosin , and amlodipine  to be [employer].  He has a number of social determinants of health which are negatively impacting his ability to get consistent care.  Hopefully he will be able to get these medications and we talked about the need for consistent use.  Patient understands and is agreeable.  Follow-up with me in 1 month.      Relevant Medications   amLODipine  (NORVASC ) 10 MG tablet   atorvastatin  (LIPITOR) 20 MG tablet   doxazosin  (CARDURA ) 4 MG tablet   hydrALAZINE  (APRESOLINE ) 100 MG tablet   isosorbide  mononitrate (IMDUR ) 120 MG 24 hr tablet   Chronic heart failure with preserved  ejection fraction (HFpEF) (HCC) (Chronic)   Chronic heart failure with preserved EF due to LVH caused by longstanding hypertension.  He is euvolemic on exam today with no congestion.  Currently using torsemide  10 mg as needed.  Does not look like he needs any today.  We are working on controlling his blood pressure.  Will continue with Farxiga .      Relevant Medications   amLODipine  (NORVASC ) 10 MG tablet   atorvastatin  (LIPITOR) 20 MG tablet   dapagliflozin  propanediol (FARXIGA ) 10 MG TABS tablet   doxazosin  (CARDURA ) 4 MG tablet   hydrALAZINE  (APRESOLINE ) 100 MG tablet   isosorbide  mononitrate (IMDUR ) 120 MG 24 hr tablet   Other Relevant Orders   AMB Referral [facility] Care Management   Depression (Chronic)   Chronic and stable.  A number of external stressors, financial hardship are weighing on him.  He lost his job due to taking too many sick days about a month ago.  Unable to make rents, going through eviction process  right now, he is expecting to be homeless by the end of the month.  No suicidal ideation.  No psychotic features.  I have referred him to [facility] for social work resources.  He is considering applying for disability.  Will plan to continue with sertraline , I sent refills.      Relevant Medications   sertraline  (ZOLOFT ) 50 MG tablet   Other Relevant Orders   AMB Referral [facility] Care Management   Other Visit Diagnoses       Heart failure with mildly reduced ejection fraction (HFmrEF) (HCC)       Relevant Medications   amLODipine  (NORVASC ) 10 MG tablet   atorvastatin  (LIPITOR) 20 MG tablet   dapagliflozin  propanediol (FARXIGA ) 10 MG TABS tablet   doxazosin  (CARDURA ) 4 MG tablet   hydrALAZINE  (APRESOLINE ) 100 MG tablet   isosorbide  mononitrate (IMDUR ) 120 MG 24 hr tablet       Return in about 4 weeks (around 01/30/2024).    Cleatus Debby Specking, MD

## 2024-01-02 NOTE — Assessment & Plan Note (Addendum)
 Progressive CKD, at stage IV and likely to come in to stage V soon.  Will check labs today, last checked about 2 months ago.  He is having a number of symptoms of CKD including weight loss, loss of appetite, dyspnea with exertion.  No volume overload on exam today.  He still making urine.  I first referred him to [facility] in 2024, but he did not follow-up.  He is consulted with nephrology 1 time in the hospital.  Will refer him again to outpatient nephrology, he understands the importance of establishing this care.  We talked about his very high risk of progressing to ESRD in the coming months.  At this rate it will probably happen in a hospitalization.  I urged him to start taking medications on a regular basis, to control his hypertension which would buy us  more time.  Unfortunately he is at risk for a poor outcome despite our best efforts.  Will check labs today including BMP, Cystatin C, urine microalbumin.  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  Will check PTH.  Renal function is too poor for an ARB it, will continue Farxiga .

## 2024-01-02 NOTE — Assessment & Plan Note (Signed)
 Chronic severe asymptomatic hypertension due to advanced CKD.  Exam is reassuring today despite severe elevation in blood pressure.  Etiology is due to poor adherence with medications.  He reports having the medications at home, but his refill dispense history is very sporadic.  I have refilled the Imdur , hydralazine , doxazosin , and amlodipine  to be [employer].  He has a number of social determinants of health which are negatively impacting his ability to get consistent care.  Hopefully he will be able to get these medications and we talked about the need for consistent use.  Patient understands and is agreeable.  Follow-up with me in 1 month.

## 2024-01-02 NOTE — Assessment & Plan Note (Signed)
 Chronic heart failure with preserved EF due to LVH caused by longstanding hypertension.  He is euvolemic on exam today with no congestion.  Currently using torsemide  10 mg as needed.  Does not look like he needs any today.  We are working on controlling his blood pressure.  Will continue with Farxiga .

## 2024-01-02 NOTE — Assessment & Plan Note (Signed)
 Chronic and stable.  A number of external stressors, financial hardship are weighing on him.  He lost his job due to taking too many sick days about a month ago.  Unable to make rents, going through eviction process right now, he is expecting to be homeless by the end of the month.  No suicidal ideation.  No psychotic features.  I have referred him to [facility] for social work resources.  He is considering applying for disability.  Will plan to continue with sertraline , I sent refills.

## 2024-01-03 ENCOUNTER — Telehealth: Payer: Self-pay | Admitting: *Deleted

## 2024-01-03 LAB — CBC
HCT: 32.8 % — ABNORMAL LOW (ref 39.0–52.0)
Hemoglobin: 10.6 g/dL — ABNORMAL LOW (ref 13.0–17.0)
MCHC: 32.4 g/dL (ref 30.0–36.0)
MCV: 86.4 fl (ref 78.0–100.0)
Platelets: 193 K/uL (ref 150.0–400.0)
RBC: 3.79 Mil/uL — ABNORMAL LOW (ref 4.22–5.81)
RDW: 15.3 % (ref 11.5–15.5)
WBC: 4 K/uL (ref 4.0–10.5)

## 2024-01-03 LAB — IBC + FERRITIN
Ferritin: 19.5 ng/mL — ABNORMAL LOW (ref 22.0–322.0)
Iron: 48 ug/dL (ref 42–165)
Saturation Ratios: 13.3 % — ABNORMAL LOW (ref 20.0–50.0)
TIBC: 361.2 ug/dL (ref 250.0–450.0)
Transferrin: 258 mg/dL (ref 212.0–360.0)

## 2024-01-03 LAB — MICROALBUMIN / CREATININE URINE RATIO
Creatinine,U: 212.7 mg/dL
Microalb Creat Ratio: 205.3 mg/g — ABNORMAL HIGH (ref 0.0–30.0)
Microalb, Ur: 43.7 mg/dL — ABNORMAL HIGH (ref 0.0–1.9)

## 2024-01-03 LAB — BASIC METABOLIC PANEL WITH GFR
BUN: 32 mg/dL — ABNORMAL HIGH (ref 6–23)
CO2: 25 meq/L (ref 19–32)
Calcium: 8.3 mg/dL — ABNORMAL LOW (ref 8.4–10.5)
Chloride: 108 meq/L (ref 96–112)
Creatinine, Ser: 4.23 mg/dL — ABNORMAL HIGH (ref 0.40–1.50)
GFR: 15.95 mL/min — ABNORMAL LOW (ref >60.00)
Glucose, Bld: 93 mg/dL (ref 70–99)
Potassium: 3.6 meq/L (ref 3.5–5.1)
Sodium: 142 meq/L (ref 135–145)

## 2024-01-03 NOTE — Progress Notes (Unsigned)
 Complex Care Management Note Care Guide Note  01/03/2024 Name: Mark Hayden MRN: [id number] DOB: 01-29-1976   Complex Care Management Outreach Attempts: An unsuccessful telephone outreach was attempted today to offer the patient information about available complex care management services.  Follow Up Plan:  Additional outreach attempts will be made to offer the patient complex care management information and services.   Encounter Outcome:  No Answer  Thedford Franks, CMA [place]  [facility], [facility] Guide Direct Dial: [phone]  Fax: [phone] Website: [email or web address]

## 2024-01-04 ENCOUNTER — Other Ambulatory Visit (HOSPITAL_BASED_OUTPATIENT_CLINIC_OR_DEPARTMENT_OTHER): Payer: Self-pay

## 2024-01-04 LAB — CYSTATIN C WITH GLOMERULAR FILTRATION RATE, ESTIMATED (EGFR)
CYSTATIN C: 3.82 mg/L — ABNORMAL HIGH (ref 0.52–1.27)
eGFR: 14 mL/min/1.73m2 — ABNORMAL LOW (ref >=60)

## 2024-01-04 LAB — PARATHYROID HORMONE, INTACT (NO CA): PTH: 633 pg/mL — ABNORMAL HIGH (ref 16–77)

## 2024-01-06 ENCOUNTER — Ambulatory Visit: Payer: Self-pay | Admitting: Student in an Organized Health Care Education/Training Program

## 2024-01-06 NOTE — Progress Notes (Unsigned)
 Complex Care Management Note Care Guide Note  01/06/2024 Name: Mark Hayden MRN: [id number] DOB: 01/27/1976   Complex Care Management Outreach Attempts: A second unsuccessful outreach was attempted today to offer the patient with information about available complex care management services.  Follow Up Plan:  Additional outreach attempts will be made to offer the patient complex care management information and services.   Encounter Outcome:  No Answer  Thedford Franks, CMA [place]  [facility], [facility] Guide Direct Dial: [phone]  Fax: [phone] Website: [email or web address]

## 2024-01-07 NOTE — Progress Notes (Signed)
 Complex Care Management Note  Care Guide Note 01/07/2024 Name: Mark Hayden MRN: [id number] DOB: [birth year]  Mark Hayden is a 48 y.o. year old male who sees [name], [name], MD for primary care. I reached out to [employer] by phone today to offer complex care management services.  [name] was given information about Complex Care Management services today including:   The Complex Care Management services include support from the care team which includes your Nurse Care Manager, Clinical Social Worker, or Pharmacist.  The Complex Care Management team is here to help remove barriers to the health concerns and goals most important to you. Complex Care Management services are voluntary, and the patient may decline or stop services at any time by request to their care team member.   Complex Care Management Consent Status: Patient agreed to services and verbal consent obtained.   Follow up plan:  Telephone appointment with complex care management team member scheduled for:  01/09/2024 and 01/14/2024  Encounter Outcome:  Patient Scheduled  Thedford Franks, CMA, [place]  [facility], [facility] Guide Direct Dial: [phone]  Fax: [phone] Website: [email or web address]

## 2024-01-09 ENCOUNTER — Other Ambulatory Visit: Payer: Self-pay

## 2024-01-09 NOTE — Patient Outreach (Signed)
 Complex Care Management   Visit Note  01/09/2024  Name:  Mark Hayden MRN: [id number] DOB: 1975-10-07  Situation: Referral received for Complex Care Management related to SDOH Barriers:  Housing homeless soon Food insecurity Financial Resource Strain I obtained verbal consent from Patient.  Visit completed with Patient  on the phone  Background:   Past Medical History:  Diagnosis Date   CHF (congestive heart failure) (HCC)    Hypertension    Hypertensive emergency 08/20/2022   Pneumonia    Sleep apnea    Suicidal thoughts 09/08/2019    Assessment:  Patient reports he lost his job in September and applied for foodstamps 10/14. Patient has an appointment with [employer] 10/30 for disability. Patient has been in eviction status for several months (landlord is working with him) and will not be able to pay November rent. Patient expects to be padlocked by November 5 but has not exact date. Patient has a friend he can speak to about temporary housing. Patients daughter also suggested relocating to [place] . SW does educate patient about Medicaid transportation for medical visits, although patient has a car, it would prevent a crisis with gas money for his car. SW to provide contact information for [employer] and Food banks. SW educated patient on shelter options, but he is going to consider shelter as a last resort.   SDOH Interventions    Flowsheet Row Patient Outreach Telephone from 01/09/2024 in [place] [facility]PART[facility] Office Visit from 11/15/2022 in [facility] [place] Telephone from 09/12/2022 in [facility] HEART & VASCULAR TRANSITION OF CARE NEW from 09/05/2022 in [facility] ED to Hosp-Admission (Discharged) from 08/17/2022 in [facility] 3E HF PCU  SDOH Interventions       Food Insecurity Interventions  Other (Comment)  [Provided food bank list and applied for Foodstamps] -- -- Other (Comment)  [H&V Food Bag] Intervention Not Indicated  Housing Interventions Other (Comment)  [In the process of eviction, will speak to family and friends about options] -- -- Intervention Not Indicated Intervention Not Indicated  Transportation Interventions Intervention Not Indicated  [Has a car and transportation from medicaid] -- -- Intervention Not Indicated Taxi Voucher Given  Utilities Interventions Intervention Not Indicated  [Power off for 2 weeks.] -- -- Intervention Not Indicated Intervention Not Indicated  Alcohol Usage Interventions -- -- -- -- Intervention Not Indicated (Score <7)  Depression Interventions/Treatment  -- Medication, Counseling -- -- --  Financial Strain Interventions --  [No current income and waiting on foodstamp approval] -- Other (Comment)  [Assisted with Patient Care Fund] Other (Comment)  [H&V Patient Care Fund] Other (Comment)      Recommendation:   None  Follow Up Plan:   Telephone follow up appointment date/time:  01/15/24 at 10am  [name], BSW [place]  [facility], [facility] Social Worker Direct Dial: [phone]  Fax: [phone] Website: [email or web address]

## 2024-01-09 NOTE — Patient Instructions (Signed)
 Visit Information  Thank you for taking time to visit with me today. Please don't hesitate to contact me if I can be of assistance to you before our next scheduled appointment.  Our next appointment is by telephone on 01/15/24 at 10am Please call the care guide team at [phone] if you need to cancel or reschedule your appointment.   Following is a copy of your care plan:   Goals Addressed             This Visit's Progress    BSW [facility] Social Work Care Plan       Problems:   Corporate treasurer , Geophysicist/field seismologist , and Housing   CSW Clinical Goal(s):   Over the next 7 days the Patient will will follow up with food banks and friends/family as directed by Social Work.  Interventions:  Social Determinants of Health in Patient with CKD Stage 4, Depression, Homelessness, and HTN: SDOH assessments completed: Financial Strain , Food Insecurity , and Housing  Evaluation of current treatment plan related to unmet needs Patient reports he lost his job in September and applied for foodstamps 10/14.  Patient has an appointment with [employer] 10/30 for disability.  Patient has been in eviction status for several months (landlord is working with him) and will not be able to pay November rent.  Patient expects to be padlocked by November 5 but has not exact date.  Patient has a friend he can speak to about temporary housing.  Patients daughter also suggested relocating to [place] .  SW does educate patient about Medicaid transportation for medical visits, although patient has a car, it would prevent a crisis with gas money for his car.  SW to provide contact information for [employer] and Food banks.  SW educated patient on shelter options, but he is going to consider shelter as a last resort.  Patient Goals/Self-Care Activities:  Patient to follow up with family/friend for housing, [facility] and food banks.  Plan:   Telephone follow up appointment with care management  team member scheduled for:  01/15/24 at 10am.        Please call 911 if you are experiencing a Mental Health or Behavioral Health Crisis or need someone to talk to.  Patient verbalized understanding of Care plan and visit instructions communicated this visit  Mark Hayden, BSW [place]  [facility], [facility] Emergency [facility] Social Worker Direct Dial: [phone]  Fax: [phone] Website: [email or web address]

## 2024-01-14 ENCOUNTER — Other Ambulatory Visit (HOSPITAL_BASED_OUTPATIENT_CLINIC_OR_DEPARTMENT_OTHER): Payer: Self-pay

## 2024-01-14 ENCOUNTER — Telehealth: Payer: Self-pay

## 2024-01-15 ENCOUNTER — Telehealth: Payer: Self-pay

## 2024-01-15 NOTE — Patient Instructions (Signed)
 Mark Hayden - I am sorry I was unable to reach you today for our scheduled appointment. I work with [name], [name], MD and am calling to support your healthcare needs. Please contact me at [phone] at your earliest convenience. I look forward to speaking with you soon.   Thank you,  [name], BSW [place]  [facility], [facility] Social Worker Direct Dial: [phone]  Fax: [phone] Website: [email or web address]

## 2024-01-17 ENCOUNTER — Telehealth: Payer: Self-pay

## 2024-01-17 NOTE — Patient Instructions (Signed)
 Mark Hayden - I am sorry I was unable to reach you today for our scheduled appointment. I work with Mark Hayden, Mark Ned, MD and am calling to support your healthcare needs. Please contact me at [phone] at your earliest convenience. I look forward to speaking with you soon.   Thank you,  [name], BSW [place]  [facility], [facility] Social Worker Direct Dial: [phone]  Fax: [phone] Website: [email or web address]

## 2024-01-20 ENCOUNTER — Telehealth: Payer: Self-pay

## 2024-01-20 NOTE — Patient Instructions (Signed)
 Shadee Ostiana-Ramos - I have attempted to call you three times but have been unsuccessful in reaching you. I work with [name], [name], MD and am calling to support your healthcare needs. If I can be of assistance to you, please contact me at [phone].     Thank you,  [name], BSW [place]  [facility], [facility] Social Worker Direct Dial: [phone]  Fax: [phone] Website: [email or web address]

## 2024-01-24 ENCOUNTER — Telehealth: Payer: Self-pay

## 2024-01-24 ENCOUNTER — Other Ambulatory Visit: Payer: Self-pay

## 2024-01-24 ENCOUNTER — Emergency Department (HOSPITAL_COMMUNITY)

## 2024-01-24 ENCOUNTER — Emergency Department (HOSPITAL_COMMUNITY): Admission: EM | Admit: 2024-01-24 | Discharge: 2024-01-24 | Disposition: A | Discharge status: Home / Self Care

## 2024-01-24 ENCOUNTER — Encounter (HOSPITAL_COMMUNITY): Payer: Self-pay

## 2024-01-24 DIAGNOSIS — R0602 Shortness of breath: Secondary | ICD-10-CM | POA: Insufficient documentation

## 2024-01-24 DIAGNOSIS — W19XXXA Unspecified fall, initial encounter: Secondary | ICD-10-CM | POA: Insufficient documentation

## 2024-01-24 DIAGNOSIS — R079 Chest pain, unspecified: Secondary | ICD-10-CM | POA: Insufficient documentation

## 2024-01-24 DIAGNOSIS — Z7982 Long term (current) use of aspirin: Secondary | ICD-10-CM | POA: Insufficient documentation

## 2024-01-24 DIAGNOSIS — R110 Nausea: Secondary | ICD-10-CM | POA: Insufficient documentation

## 2024-01-24 DIAGNOSIS — Y92002 Bathroom of unspecified non-institutional (private) residence single-family (private) house as the place of occurrence of the external cause: Secondary | ICD-10-CM | POA: Insufficient documentation

## 2024-01-24 DIAGNOSIS — R42 Dizziness and giddiness: Secondary | ICD-10-CM | POA: Insufficient documentation

## 2024-01-24 DIAGNOSIS — R55 Syncope and collapse: Secondary | ICD-10-CM

## 2024-01-24 LAB — CBC WITH DIFFERENTIAL/PLATELET
Abs Immature Granulocytes: 0.02 K/uL (ref 0.00–0.07)
Basophils Absolute: 0 K/uL (ref 0.0–0.1)
Basophils Relative: 0 %
Eosinophils Absolute: 0.1 K/uL (ref 0.0–0.5)
Eosinophils Relative: 2 %
HCT: 30.4 % — ABNORMAL LOW (ref 39.0–52.0)
Hemoglobin: 9.5 g/dL — ABNORMAL LOW (ref 13.0–17.0)
Immature Granulocytes: 0 %
Lymphocytes Relative: 7 %
Lymphs Abs: 0.4 K/uL — ABNORMAL LOW (ref 0.7–4.0)
MCH: 28.4 pg (ref 26.0–34.0)
MCHC: 31.3 g/dL (ref 30.0–36.0)
MCV: 90.7 fL (ref 80.0–100.0)
Monocytes Absolute: 0.3 K/uL (ref 0.1–1.0)
Monocytes Relative: 5 %
Neutro Abs: 4.5 K/uL (ref 1.7–7.7)
Neutrophils Relative %: 86 %
Platelets: 129 K/uL — ABNORMAL LOW (ref 150–400)
RBC: 3.35 MIL/uL — ABNORMAL LOW (ref 4.22–5.81)
RDW: 15.3 % (ref 11.5–15.5)
WBC: 5.2 K/uL (ref 4.0–10.5)
nRBC: 0 % (ref 0.0–0.2)

## 2024-01-24 LAB — URINALYSIS, ROUTINE W REFLEX MICROSCOPIC
Bacteria, UA: NONE SEEN
Bilirubin Urine: NEGATIVE
Glucose, UA: 50 mg/dL — AB
Hgb urine dipstick: NEGATIVE
Ketones, ur: NEGATIVE mg/dL
Leukocytes,Ua: NEGATIVE
Nitrite: NEGATIVE
Protein, ur: 30 mg/dL — AB
Specific Gravity, Urine: 1.012 (ref 1.005–1.030)
pH: 5 (ref 5.0–8.0)

## 2024-01-24 LAB — COMPREHENSIVE METABOLIC PANEL WITH GFR
ALT: 27 U/L (ref 0–44)
AST: 21 U/L (ref 15–41)
Albumin: 3.3 g/dL — ABNORMAL LOW (ref 3.5–5.0)
Alkaline Phosphatase: 85 U/L (ref 38–126)
Anion gap: 12 (ref 5–15)
BUN: 37 mg/dL — ABNORMAL HIGH (ref 6–20)
CO2: 17 mmol/L — ABNORMAL LOW (ref 22–32)
Calcium: 7.2 mg/dL — ABNORMAL LOW (ref 8.9–10.3)
Chloride: 112 mmol/L — ABNORMAL HIGH (ref 98–111)
Creatinine, Ser: 4.34 mg/dL — ABNORMAL HIGH (ref 0.61–1.24)
GFR, Estimated: 16 mL/min — ABNORMAL LOW (ref >60)
Glucose, Bld: 142 mg/dL — ABNORMAL HIGH (ref 70–99)
Potassium: 3.7 mmol/L (ref 3.5–5.1)
Sodium: 141 mmol/L (ref 135–145)
Total Bilirubin: 0.5 mg/dL (ref 0.0–1.2)
Total Protein: 6 g/dL — ABNORMAL LOW (ref 6.5–8.1)

## 2024-01-24 LAB — TROPONIN I (HIGH SENSITIVITY)
Troponin I (High Sensitivity): 71 ng/L — ABNORMAL HIGH (ref <18)
Troponin I (High Sensitivity): 77 ng/L — ABNORMAL HIGH (ref <18)

## 2024-01-24 LAB — BRAIN NATRIURETIC PEPTIDE: B Natriuretic Peptide: 418.1 pg/mL — ABNORMAL HIGH (ref 0.0–100.0)

## 2024-01-24 MED ORDER — FUROSEMIDE 10 MG/ML IJ SOLN
40.0000 mg | Freq: Once | INTRAMUSCULAR | Status: DC
  Filled 2024-01-24: qty 4

## 2024-01-24 NOTE — Discharge Instructions (Signed)
 Please continue take your home medications.  Follow-up with your primary doctor and your cardiologist.  Return for fevers, chills, chest pain, shortness of breath, palpitations, feel your heart is racing, severe abdominal pain or any new or worsening symptoms that are concerning to you.

## 2024-01-24 NOTE — ED Provider Notes (Signed)
 [place] EMERGENCY DEPARTMENT AT [facility] Provider Note   CSN: [id number] Arrival date & time: 01/24/24  9056     Patient presents with: Shortness of Breath   Mark Hayden is a 48 y.o. male.   This is a 48 year old male presenting emergency department chest pain shortness of breath.  Had some lightheadedness while ambulating to the bathroom.  Did not pass out but felt like he was going to.  Continue to have some shortness of breath with slightly nauseated.  Currently feeling improved   Shortness of Breath      Prior to Admission medications   Medication Sig Start Date End Date Taking? Authorizing Provider  amLODipine  (NORVASC ) 10 MG tablet Take 1 tablet (10 mg total) by mouth daily. 01/02/24 04/01/24  [name] [name], MD  aspirin  EC 81 MG tablet Take 1 tablet (81 mg total) by mouth daily. Swallow whole. 09/02/23   Lue Elsie BROCKS, MD  atorvastatin  (LIPITOR) 20 MG tablet Take 1 tablet (20 mg total) by mouth daily. 01/02/24   [name] [name], MD  dapagliflozin  propanediol (FARXIGA ) 10 MG TABS tablet Take 1 tablet (10 mg total) by mouth daily. 01/02/24   [name] [name], MD  doxazosin  (CARDURA ) 4 MG tablet Take 1 tablet (4 mg total) by mouth daily. 01/02/24   [name] [name], MD  hydrALAZINE  (APRESOLINE ) 100 MG tablet Take 1 tablet (100 mg total) by mouth 3 (three) times daily. 01/02/24   [name] [name], MD  isosorbide  mononitrate (IMDUR ) 120 MG 24 hr tablet Take 1 tablet (120 mg total) by mouth daily. 01/02/24   [name] [name], MD  sertraline  (ZOLOFT ) 50 MG tablet Take 1 tablet (50 mg total) by mouth daily. 01/02/24 01/01/25  [name] [name], MD  sodium bicarbonate  650 MG tablet Take 1 tablet (650 mg total) by mouth 2 (two) times daily. 06/07/23   [name] [name], MD  torsemide  (DEMADEX ) 10 MG tablet Take 1 tablet (10 mg total) by mouth daily as needed (weight gain). 06/07/23   [name] [name], MD     Allergies: Fish allergy and Shellfish allergy    Review of Systems  Respiratory:  Positive for shortness of breath.     Updated Vital Signs BP (!) 159/90   Pulse 84   Temp 98.7 F (37.1 C) (Oral)   Resp 12   Ht 5' 9 (1.753 m)   Wt 77.1 kg   SpO2 100%   BMI 25.10 kg/m   Physical Exam Vitals reviewed.  Constitutional:      General: He is not in acute distress.    Appearance: He is not toxic-appearing.  HENT:     Head: Normocephalic.  Cardiovascular:     Rate and Rhythm: Normal rate and regular rhythm.  Pulmonary:     Breath sounds: No decreased breath sounds, wheezing, rhonchi or rales.  Musculoskeletal:     Cervical back: Normal range of motion.     Right lower leg: No edema.     Left lower leg: No edema.  Skin:    General: Skin is warm.     Capillary Refill: Capillary refill takes less than 2 seconds.  Neurological:     Mental Status: He is alert and oriented to person, place, and time.  Psychiatric:        Mood and Affect: Mood normal.        Behavior: Behavior normal.     (all labs ordered are listed, but only abnormal results are displayed) Labs  Reviewed  CBC WITH DIFFERENTIAL/PLATELET - Abnormal; Notable for the following components:      Result Value   RBC 3.35 (*)    Hemoglobin 9.5 (*)    HCT 30.4 (*)    Platelets 129 (*)    Lymphs Abs 0.4 (*)    All other components within normal limits  COMPREHENSIVE METABOLIC PANEL WITH GFR - Abnormal; Notable for the following components:   Chloride 112 (*)    CO2 17 (*)    Glucose, Bld 142 (*)    BUN 37 (*)    Creatinine, Ser 4.34 (*)    Calcium  7.2 (*)    Total Protein 6.0 (*)    Albumin 3.3 (*)    GFR, Estimated 16 (*)    All other components within normal limits  BRAIN NATRIURETIC PEPTIDE - Abnormal; Notable for the following components:   B Natriuretic Peptide 418.1 (*)    All other components within normal limits  URINALYSIS, ROUTINE W REFLEX MICROSCOPIC - Abnormal; Notable for the following  components:   Glucose, UA 50 (*)    Protein, ur 30 (*)    All other components within normal limits  TROPONIN I (HIGH SENSITIVITY) - Abnormal; Notable for the following components:   Troponin I (High Sensitivity) 71 (*)    All other components within normal limits  TROPONIN I (HIGH SENSITIVITY) - Abnormal; Notable for the following components:   Troponin I (High Sensitivity) 77 (*)    All other components within normal limits    EKG: None  Radiology: DG Chest 2 View Result Date: 01/24/2024 EXAM: 2 VIEW(S) XRAY OF THE CHEST 01/24/2024 10:25:00 AM COMPARISON: 10/28/2023 CLINICAL HISTORY: chest pain FINDINGS: LUNGS AND PLEURA: No focal pulmonary opacity. No pulmonary edema. Trace left pleural effusion. No pneumothorax. HEART AND MEDIASTINUM: No acute abnormality of the cardiac and mediastinal silhouettes. BONES AND SOFT TISSUES: No acute osseous abnormality. IMPRESSION: 1. Trace left pleural effusion. 2. Otherwise, no active cardiopulmonary disease. Electronically signed by: Selinda Blue MD 01/24/2024 10:48 AM EST RP Workstation: [id number]     Procedures   Medications Ordered in the ED  furosemide  (LASIX ) injection 40 mg (40 mg Intravenous Patient Refused/Not Given 01/24/24 1458)    Clinical Course as of 01/24/24 1551  Fri Jan 24, 2024  0950 Echo 08/29/23: 1. Left ventricular ejection fraction, by estimation, is 60 to 65%. The  left ventricle has normal function. The left ventricle has no regional  wall motion abnormalities. Moderate to severe concentric left ventricular  hypertrophy. Left ventricular  diastolic parameters are indeterminate. The average left ventricular  global longitudinal strain is -20.3 %. The global longitudinal strain is  normal.   2. Right ventricular systolic function is normal. The right ventricular  size is mildly enlarged. Increased septal RV thickness.   3. Left atrial size was moderately dilated.   4. Right atrial size was mildly dilated.   5. The  mitral valve is normal in structure. Mild mitral valve  regurgitation. No evidence of mitral stenosis.   6. The aortic valve is tricuspid. There is mild calcification of the  aortic valve. Aortic valve regurgitation is not visualized. No aortic  stenosis is present.   7. The inferior vena cava is normal in size with greater than 50%  respiratory variability, suggesting right atrial pressure of 3 mmHg.  [TY]  1133 DG Chest 2 View IMPRESSION: 1. Trace left pleural effusion. 2. Otherwise, no active cardiopulmonary disease.   [TY]    Clinical Course User Index [TY]  Neysa Caron PARAS, DO                                 Medical Decision Making This is a 48 year old male presenting emergency department for chest pain shortness of breath.  He is afebrile nontachycardic, hemodynamic stable.  No hypoxia noted with EMS he has a history of CHF with preserved EF.  EKG without ischemic changes.  Troponin mildly elevated, but flat.  Seems to have chronic elevation.  He is at baseline kidney function.  BNP also seems to be at baseline for him.  No new anemia again, baseline.  All of his labs appear to be stable.  Chest x-ray without pneumonia or pneumothorax.  Does not appear to be overtly fluid overloaded or acutely decompensated.  Observed in the emergency department.  Ordered Lasix , but he declined stating that he took earlier.  I suspect vasovagal type episode for his symptoms he is ambulated in the emergency department with with steady gait no symptoms.  Discussed follow-up with his primary doctor and cardiologist.  Agreeable to plan.  Discharged in stable condition.  Amount and/or Complexity of Data Reviewed Labs: ordered. Radiology: ordered. Decision-making details documented in ED Course. ECG/medicine tests: ordered.  Risk Prescription drug management.       Final diagnoses:  None    ED Discharge Orders     None          Neysa Caron PARAS, DO 01/24/24 1551

## 2024-01-24 NOTE — ED Triage Notes (Signed)
 Ems reports pt called due to chest pain and SHOB. Reports pt fell In the bathroom while in there. Chest pain intermittent EMS states pain had stopped once they arrived but he continued to have SHOB. Ems reports small amount of vomiting with no reported nausea. Refused to take aspirin  with EMS. EMS gave 250cc of NS in route. 20 g to L AC. EMS also reports that call to APS was placed in route due to conditions of the home. House had no floor, ceiling , heat or air.   EMS vitals- BP 108/70 HR 70 O2 sats 100% on RA

## 2024-01-28 ENCOUNTER — Ambulatory Visit: Payer: Self-pay

## 2024-01-28 DIAGNOSIS — F331 Major depressive disorder, recurrent, moderate: Secondary | ICD-10-CM

## 2024-01-28 NOTE — Telephone Encounter (Signed)
 Called [facility] and advised them of patient's situation

## 2024-01-28 NOTE — Telephone Encounter (Signed)
 Patient returned call, patient stated he will wait to see Dr.Vincent on Thursday, he has skipped 4 days of his sertraline . I have 3 places with phone numbers to relay to patient to shelter due to not having a place to stay. I did let patient know he would have to call soon as these phone lines do close at a certain time. Patient stated never mind he will figure something out.

## 2024-01-28 NOTE — Telephone Encounter (Signed)
Called patient and left VM to return call.

## 2024-01-28 NOTE — Telephone Encounter (Addendum)
 Offered patient appointments today within his PCP office but he didn't want that. He said he had an appointment 02/03/2024 with his PCP and he has to go to work right now. Patient did state on his way to work that he was close to the office and he could stop by and this RN called [facility] at that time to see if patient could be added to the schedule--which they advised his PCP had no openings---[facility] was suggested and also advised by this RN, along with other resources as mentioned in chart.  Please reach out to patient for further assistance.  FYI Only or Action Required?: Action required by provider: request for appointment, clinical question for provider, and update on patient condition.  Patient was last seen in primary care on 01/02/2024 by [name] [name], MD.  Called Nurse Triage reporting Depression.  Symptoms began within the past two weeks--pt states Friday he had a notice on his door.  Interventions attempted: Nothing.  Symptoms are: unchanged.  Triage Disposition: See Physician Within 24 Hours  Patient/caregiver understands and will follow disposition?: No, wishes to speak with PCP                Copied from CRM #[id number]. Topic: Clinical - Red Word Triage >> Jan 28, 2024  1:29 PM Eva FALCON wrote: Red Word that prompted transfer to Nurse Triage: Shrita from [facility] solution is calling in and states a pt they just did an assessment over the phone scored really high on depression screening. states they have suicidal  thoughts, but no plan. Reason for Disposition  Depression symptoms (sadness, hopelessness, decreased energy) interfere with work or school  Answer Assessment - Initial Assessment Questions Friday--patient went to the ER and released same day & when he got home he found a notice on his door that house was condemned    1. CONCERN: What happened that made you call today?     ----- 2. DEPRESSION SYMPTOM SCREENING:  How are you feeling overall? (e.g., decreased energy, increased sleeping or difficulty sleeping, difficulty concentrating, feelings of sadness, guilt, hopelessness, or worthlessness)     ------ 3. RISK OF HARM - SUICIDAL IDEATION:  Do you ever have thoughts of hurting or killing yourself?  (e.g., yes, no, no but preoccupation with thoughts about death)     Not really but I'm not feeling my best 4. RISK OF HARM - HOMICIDAL IDEATION:  Do you ever have thoughts of hurting or killing someone else?  (e.g., yes, no, no but preoccupation with thoughts about death)     No 5. FUNCTIONAL IMPAIRMENT: How have things been going for you overall? Have you had more difficulty than usual doing your normal daily activities?  (e.g., better, same, worse; self-care, school, work, interactions)     ------ 6. SUPPORT: Who is with you now? Who do you live with? Do you have family or friends who you can talk to?      Sister and friends 7. THERAPIST: Do you have a counselor or therapist? If Yes, ask: What is their name?     ---- 8. STRESSORS: Has there been any new stress or recent changes in your life?     ---- 9. ALCOHOL USE OR SUBSTANCE USE (DRUG USE): Do you drink alcohol or use any illegal drugs?     ----- 10. OTHER: Do you have any other physical symptoms right now? (e.g., fever)       -----  Answer Assessment - Initial Assessment  Questions [phone] ext. [phone] Sherrita Sherrita from [facility] is calling in and states a pt they just did an assessment over the phone scored really high on depression screening. states they have suicidal  thoughts, but no plan. She provided the Crisis Hotline Number 988 if needed Patient had to go to work so was unable to be connected to this RN --  Patient was told that this had to be reported to his pcp  She states that she did a PHQ2 that prompted a PHQ9 He score 23 on the PHQ9 The last question---patient stated that he has thought he  would No active thought at this time but has had thoughts lately several days over the past two weeks thought about being better off dead or hurting himself---according to the screening by Sherrita  This RN called patient and he was almost at work at this time Friday--patient went to the ER and released same day & when he got home he found a notice on his door that house was condemned  Patient was given the 988 number again by this RN, he was given the information for the [facility] and advised they were 24/7 and a walk-in. Patient is also advised that the Emergency Room is available 24/7. He states that he has to go to work right now and make money due to his current life situation. This RN advised patient to call us  back at any point if needed and also to utilize the resources given. Patient verbalized understanding and stated he needed help finding somewhere to live now.        3. RISK OF HARM - SUICIDAL IDEATION:  Do you ever have thoughts of hurting or killing yourself?  (e.g., yes, no, no but preoccupation with thoughts about death)     Not really but I'm not feeling my best He denies any thoughts about harming himself during this triage call 4. RISK OF HARM - HOMICIDAL IDEATION:  Do you ever have thoughts of hurting or killing someone else?  (e.g., yes, no, no but preoccupation with thoughts about death)     No  6. SUPPORT: Who is with you now? Who do you live with? Do you have family or friends who you can talk to?      Sister and friends   Patient is currently about to be at work---he states that his bills are behind He states he doesn't have anywhere to live now and he is upset He denies any thoughts about harming himself at this time and he said he is upset because now he has nowhere to live He states he is going to work right now because he has to make money  Protocols used: Depression-A-AH, Suicide Concerns-A-AH

## 2024-01-28 NOTE — Telephone Encounter (Signed)
 I will place a new referral for social work through [facility].  I hope he will work with them on resources for housing insecurity.  He should continue sertraline  for depression and please offer him a acute visit with me regarding his mood.

## 2024-01-29 DIAGNOSIS — Z419 Encounter for procedure for purposes other than remedying health state, unspecified: Secondary | ICD-10-CM | POA: Diagnosis not present

## 2024-01-30 ENCOUNTER — Ambulatory Visit (INDEPENDENT_AMBULATORY_CARE_PROVIDER_SITE_OTHER): Admitting: Student in an Organized Health Care Education/Training Program

## 2024-01-30 ENCOUNTER — Encounter: Payer: Self-pay | Admitting: Student in an Organized Health Care Education/Training Program

## 2024-01-30 VITALS — BP 198/111 | HR 83 | Wt 191.0 lb

## 2024-01-30 DIAGNOSIS — I5032 Chronic diastolic (congestive) heart failure: Secondary | ICD-10-CM

## 2024-01-30 DIAGNOSIS — I10 Essential (primary) hypertension: Secondary | ICD-10-CM | POA: Diagnosis not present

## 2024-01-30 DIAGNOSIS — F331 Major depressive disorder, recurrent, moderate: Secondary | ICD-10-CM | POA: Diagnosis not present

## 2024-01-30 NOTE — Assessment & Plan Note (Signed)
 Chronic severe asymptomatic hypertension due to nonadherence with medications.  I urged him to restart using hydralazine , Imdur , amlodipine , and doxazosin .  Antihypertensives are limited because of his advanced CKD.  Currently he is asymptomatic but at high risk for progression

## 2024-01-30 NOTE — Assessment & Plan Note (Signed)
 Chronic uncontrolled depression, recently exacerbated by housing insecurity.  Mood seems stable today.  Low risk for self-harm on my exam.  I recommended better adherence with sertraline  on a daily basis.  We talked about some of the perceived side effects he is experiencing.  I have given him contact information for social work to talk about resources for housing and food.

## 2024-01-30 NOTE — Progress Notes (Signed)
 Established Patient Office Visit  Patient ID: Mark Hayden, male    DOB: 02/11/1976  Age: 48 y.o. MRN: [id number] PCP: [name] [name], MD  Chief Complaint  Patient presents with   Medical Management of Chronic Issues    4 week recheck     Subjective:     HPI  Discussed the use of AI scribe software for clinical note transcription with the patient, who gave verbal consent to proceed.  History of Present Illness Mark Hayden is a 48 year old male who presents with concerns about recent episodes of dizziness and medication side effects.  He was recently in the emergency department for syncope and near syncope. He does not recall the events leading up to his hospital visit and is unclear about the exact diagnosis. He was informed that it might have been a vasovagal episode. He experiences lightheadedness and dizziness every time he takes his medication, which discourages him from taking them regularly.  He mentions that he has Lasix  at home but ran out of sodium bicarbonate . He takes Torsemide  once a day but sometimes increases the dose to twice a day when he feels it is necessary.  He is currently taking sertraline  and notices that while it helps him manage his mood, it also makes him feel emotionally numb, which he finds unsettling. He describes feeling 'immune' to emotional reactions, which he finds 'scary' because it prevents him from feeling sad or reacting to stressors. Despite this, he continues to take sertraline  as he has become accustomed to it.  He describes an episode of chest pain and tingling in his fingers and toes, which was different from his usual symptoms and prompted him to call 911. He was concerned it might be a heart attack. He experiences similar symptoms with every dose of his medication, which he suspects might be panic attacks.  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 He is dealing with significant life changes, including job loss and housing instability, which contribute to his stress levels.    Objective:     BP (!) 198/111   Pulse 83   Wt 191 lb (86.6 kg)   SpO2 97%   BMI 28.21 kg/m   Physical Exam  Gen: Chronically ill-appearing man, weight increase noted from last visit Neck: No JVD Heart: Regular, 2 out of 6 early systolic murmur best heard at the right upper sternal border Lungs: Unlabored, clear throughout Ext: Warm, 1+ pitting edema in both lower extremities Psych: Calm, interactive, mildly anxious appearing, not depressed appearing, normal speech, not pressured, organized thoughts    Assessment & Plan:   Problem List Items Addressed This Visit       High   Hypertension (Chronic)   Chronic severe asymptomatic hypertension due to nonadherence with medications.  I urged him to restart using hydralazine , Imdur , amlodipine , and doxazosin .  Antihypertensives are limited because of his advanced CKD.  Currently he is asymptomatic but at high risk for progression      Chronic heart failure with preserved ejection fraction (HFpEF) (HCC) (Chronic)   Exam today consistent with mild congestion.  Weight is up significantly from visit last month, but his exam is only mildly overloaded.  I am reassured that his extremities are warm, edema is minimal, and no crackles in his lungs.  I recommended increasing torsemide  to twice daily for the next 3  days.  Then go back to once daily torsemide .  Follow-up with me in 1 month for reassessment of volume or sooner if the dyspnea on exertion worsens.  Heart failure is chronically poorly controlled because of poor adherence with medications.  Blood pressure is uncontrolled today which will likely worsen heart failure if it does not improve.  Goal-directed medical therapy is limited because of advanced CKD.  Because of the mild volume  overloaded I recommended discontinuing sodium bicarb.      Depression - Primary (Chronic)   Chronic uncontrolled depression, recently exacerbated by housing insecurity.  Mood seems stable today.  Low risk for self-harm on my exam.  I recommended better adherence with sertraline  on a daily basis.  We talked about some of the perceived side effects he is experiencing.  I have given him contact information for social work to talk about resources for housing and food.       Return in about 4 weeks (around 02/27/2024).    Cleatus Debby Specking, MD [facility]

## 2024-01-30 NOTE — Assessment & Plan Note (Addendum)
 Exam today consistent with mild congestion.  Weight is up significantly from visit last month, but his exam is only mildly overloaded.  I am reassured that his extremities are warm, edema is minimal, and no crackles in his lungs.  I recommended increasing torsemide  to twice daily for the next 3 days.  Then go back to once daily torsemide .  Follow-up with me in 1 month for reassessment of volume or sooner if the dyspnea on exertion worsens.  Heart failure is chronically poorly controlled because of poor adherence with medications.  Blood pressure is uncontrolled today which will likely worsen heart failure if it does not improve.  Goal-directed medical therapy is limited because of advanced CKD.  Because of the mild volume overloaded I recommended discontinuing sodium bicarb.

## 2024-01-30 NOTE — Patient Instructions (Addendum)
 Mark Hayden - I have attempted to call you three times but have been unsuccessful in reaching you. I work with Mark Hayden, Mark Ned, MD and am calling to support your healthcare needs. If I can be of assistance to you, please contact me at [phone].      Thank you,  Mark Hayden, BSW [place]  [facility], [facility] Social Worker Direct Dial: [phone]  Fax: [phone] Website: [email or web address]         VISIT SUMMARY: Today, we discussed your recent episodes of dizziness and medication side effects. You were recently in the emergency department for fainting, which might have been a vasovagal episode. We also reviewed your current medications and their effects on your mood and physical symptoms. Additionally, we talked about your chronic conditions, including heart failure and kidney disease, and how to manage them effectively.  YOUR PLAN: -SYNCOPE AND NEAR SYNCOPE: Syncope means fainting, and near syncope means feeling like you might faint. Your recent episode was likely a vasovagal episode, which is a common cause of fainting due to a sudden drop in heart rate and blood pressure. Continue taking sertraline  for anxiety management and make sure to take your medications consistently.  -CHRONIC HEART FAILURE WITH PRESERVED EJECTION FRACTION AND STAGE 4 CHRONIC KIDNEY DISEASE: Chronic heart failure with preserved ejection fraction means your heart is not pumping as efficiently as it should, but the amount of blood being pumped out is normal. Stage 4 chronic kidney disease means your kidneys are severely damaged. To manage fluid retention, increase your torsemide  to twice daily for the rest of the week.  -HYPERTENSION: Hypertension means high blood pressure. Managing your blood pressure is important to prevent complications. Continue monitoring your blood pressure regularly.  -DEPRESSION AND ANXIETY SYMPTOMS: Depression and anxiety are mental health  conditions that affect your mood and stress levels. Sertraline  helps manage these symptoms, though it may cause emotional numbness. It is important for stress management and preventing panic attacks. Continue taking sertraline  and ensure consistent medication adherence.  INSTRUCTIONS: Please follow up with your primary care provider in two weeks to review your symptoms and medication effects. Make sure to take your medications as prescribed and monitor your blood pressure regularly. If you experience any new or worsening symptoms, seek medical attention immediately.

## 2024-01-31 ENCOUNTER — Telehealth: Payer: Self-pay | Admitting: Student in an Organized Health Care Education/Training Program

## 2024-01-31 NOTE — Telephone Encounter (Signed)
 Copied from CRM #[id number]. Topic: Referral - Request for Referral >> Jan 31, 2024 10:00 AM Drema MATSU wrote: Did the patient discuss referral with their provider in the last year? Yes (If No - schedule appointment) (If Yes - send message)  Appointment offered? No  Type of order/referral and detailed reason for visit: Mental health provider (therapist) for history of depression   Preference of office, provider, location: close to [facility]  If referral order, have you been seen by this specialty before? Yes (If Yes, this issue or another issue? When? Where?  Can we respond through MyChart? Yes

## 2024-01-31 NOTE — Telephone Encounter (Signed)
 Had to leave a message to call back, most therapists will not require a referral has he called an office and requested an appointment? We can send a referral but it may not be necessary.

## 2024-02-03 ENCOUNTER — Ambulatory Visit: Admitting: Student in an Organized Health Care Education/Training Program

## 2024-02-03 ENCOUNTER — Other Ambulatory Visit: Payer: Self-pay

## 2024-02-03 NOTE — Patient Instructions (Signed)
 Visit Information  Thank you for taking time to visit with me today. Please don't hesitate to contact me if I can be of assistance to you before our next scheduled telephone appointment.  Our next appointment is by telephone on 02/18/24 at 11am.  Following is a copy of your care plan:   Goals Addressed             This Visit's Progress    VBCI RN Care Plan       Problems:  Chronic Disease Management support and education needs related to CHF and HTN  Goal: Over the next 4 months the Patient will continue to work with RN Care Manager and/or Social Worker to address care management and care coordination needs related to CHF and HTN as evidenced by adherence to care management team scheduled appointments      Interventions:   Heart Failure Interventions: Basic overview and discussion of pathophysiology of Heart Failure reviewed Provided education on low sodium diet Assessed need for readable accurate scales in home Provided education about placing scale on hard, flat surface Advised patient to weigh each morning after emptying bladder Discussed importance of daily weight and advised patient to weigh and record daily Reviewed role of diuretics in prevention of fluid overload and management of heart failure; Discussed the importance of keeping all appointments with provider Provided patient with education about the role of exercise in the management of heart failure Screening for signs and symptoms of depression related to chronic disease state  Assessed social determinant of health barriers   Hypertension Interventions: Last practice recorded BP readings:  BP Readings from Last 3 Encounters:  01/30/24 (!) 198/111  01/24/24 (!) 149/81  01/02/24 (!) 213/122   Most recent eGFR/CrCl:  Lab Results  Component Value Date   EGFR 14 (L) 01/02/2024    No components found for: CRCL  Evaluation of current treatment plan related to hypertension self management and patient's adherence  to plan as established by provider Provided education to patient re: stroke prevention, s/s of heart attack and stroke Reviewed medications with patient and discussed importance of compliance Discussed plans with patient for ongoing care management follow up and provided patient with direct contact information for care management team Advised patient, providing education and rationale, to monitor blood pressure daily and record, calling PCP for findings outside established parameters Discussed complications of poorly controlled blood pressure such as heart disease, stroke, circulatory complications, vision complications, kidney impairment, sexual dysfunction Screening for signs and symptoms of depression related to chronic disease state  Assessed social determinant of health barriers  Patient Self-Care Activities:  Attend all scheduled provider appointments Call pharmacy for medication refills 3-7 days in advance of running out of medications Call provider office for new concerns or questions  Perform all self care activities independently  Perform IADL's (shopping, preparing meals, housekeeping, managing finances) independently Take medications as prescribed   Work with the social worker to address care coordination needs and will continue to work with the clinical team to address health care and disease management related needs Work with the pharmacist to address medication management needs and will continue to work with the clinical team to address health care and disease management related needs call office if I gain more than 2 pounds in one day or 5 pounds in one week do ankle pumps when sitting keep legs up while sitting track weight in diary use salt in moderation watch for swelling in feet, ankles and legs every day develop a  rescue plan follow rescue plan if symptoms flare-up eat more whole grains, fruits and vegetables, lean meats and healthy fats track symptoms and what helps  feel better or worse dress right for the weather, hot or cold check blood pressure 3 times per week learn about high blood pressure keep a blood pressure log take blood pressure log to all doctor appointments call doctor for signs and symptoms of high blood pressure keep all doctor appointments take medications for blood pressure exactly as prescribed report new symptoms to your doctor eat more whole grains, fruits and vegetables, lean meats and healthy fats limit salt intake to 1500mg /day  Plan:  The patient has been provided with contact information for the care management team and has been advised to call with any health related questions or concerns.              Care plan and visit instructions communicated with the patient verbally today. Patient agrees to receive a copy in MyChart. Active MyChart status and patient understanding of how to access instructions and care plan via MyChart confirmed with patient.     The patient has been provided with contact information for the care management team and has been advised to call with any health related questions or concerns.   Please call the care guide team at [phone] if you need to cancel or reschedule your appointment.   Please call 1-800-273-TALK (toll free, 24 hour hotline) if you are experiencing a Mental Health or Behavioral Health Crisis or need someone to talk to.  Zeidy Tayag A. Gordy RN, BA, [facility], CRRN [place]  [facility] Population Health RN Care Manager Direct Dial: [phone]  Fax: [phone]

## 2024-02-03 NOTE — Patient Outreach (Signed)
 Complex Care Management   Visit Note  02/03/2024  Name:  Mark Hayden MRN: [id number] DOB: Nov 14, 1975  Situation: Referral received for Complex Care Management related to {Criteria:32550} I obtained verbal consent from {CHL AMB Patient/Caregiver:28184}.  Visit completed with {CHL AMB Patient/Caregiver:28184}  {VISIT LOCATION:32553}  Background:   Past Medical History:  Diagnosis Date   CHF (congestive heart failure) (HCC)    Hypertension    Hypertensive emergency 08/20/2022   Pneumonia    Sleep apnea    Suicidal thoughts 09/08/2019    Assessment: Patient Reported Symptoms:  Cognitive Cognitive Status: No symptoms reported, Alert and oriented to person, place, and time, Normal speech and language skills Cognitive/Intellectual Conditions Management [RPT]: None reported or documented in medical history or problem list   Health Maintenance Behaviors: Annual physical exam Healing Pattern: Unsure  Neurological Neurological Review of Symptoms: No symptoms reported    HEENT HEENT Symptoms Reported: No symptoms reported      Cardiovascular Cardiovascular Symptoms Reported: Other: Other Cardiovascular Symptoms: Pt has uncontrolled high blood pressure - recently seen by PCP on 11/13 - patient admitted to nonadherence with antihypertensive meds but committed to taking his meds as ordered and today, during this initial engagement with [facility], patient stated he has been compliant with medication adherence since seeing PCP.SABRA Does patient have uncontrolled Hypertension?: Yes Is patient checking Blood Pressure at home?: No Patient's Recent BP reading at home: Patient's blood pressure was 198/111 when seen 11/13 by PCP Cardiovascular Management Strategies: Medication therapy, Routine screening, Adequate rest, Diet modification Cardiovascular Self-Management Outcome: 3 (uncertain)  Respiratory Respiratory Symptoms Reported: No symptoms reported    Endocrine Endocrine Symptoms Reported:  No symptoms reported Is patient diabetic?: No    Gastrointestinal Gastrointestinal Symptoms Reported: No symptoms reported      Genitourinary Additional Genitourinary Details: Has advanced CKD Genitourinary Self-Management Outcome: 3 (uncertain)  Integumentary Integumentary Symptoms Reported: No symptoms reported    Musculoskeletal Musculoskelatal Symptoms Reviewed: No symptoms reported        Psychosocial Psychosocial Symptoms Reported: Depression - if selected complete PHQ 2-9 Behavioral Health Self-Management Outcome: 3 (uncertain) Major Change/Loss/Stressor/Fears (CP): Medical condition, self, Resources Do you feel physically threatened by others?: No    02/03/2024    PHQ2-9 Depression Screening   Little interest or pleasure in doing things    Feeling down, depressed, or hopeless    PHQ-2 - Total Score    Trouble falling or staying asleep, or sleeping too much    Feeling tired or having little energy    Poor appetite or overeating     Feeling bad about yourself - or that you are a failure or have let yourself or your family down    Trouble concentrating on things, such as reading the newspaper or watching television    Moving or speaking so slowly that other people could have noticed.  Or the opposite - being so fidgety or restless that you have been moving around a lot more than usual    Thoughts that you would be better off dead, or hurting yourself in some way    PHQ2-9 Total Score    If you checked off any problems, how difficult have these problems made it for you to do your work, take care of things at home, or get along with other people    Depression Interventions/Treatment      There were no vitals filed for this visit.    Medications Reviewed Today     Reviewed by Gordy Channing LABOR, RN (  Registered Nurse) on 02/03/24 at 1049  Med List Status: <None>   Medication Order Taking? Sig Documenting Provider Last Dose Status Informant  amLODipine  (NORVASC ) 10 MG  tablet [id number] Yes Take 1 tablet (10 mg total) by mouth daily. [name] [name], MD  Active   aspirin  EC 81 MG tablet [id number] Yes Take 1 tablet (81 mg total) by mouth daily. Swallow whole. Lue Elsie BROCKS, MD  Active Self, Pharmacy Records  atorvastatin  (LIPITOR) 20 MG tablet [id number] Yes Take 1 tablet (20 mg total) by mouth daily. [name] [name], MD  Active   dapagliflozin  propanediol (FARXIGA ) 10 MG TABS tablet [id number] Yes Take 1 tablet (10 mg total) by mouth daily. [name] [name], MD  Active   doxazosin  (CARDURA ) 4 MG tablet [id number] Yes Take 1 tablet (4 mg total) by mouth daily. [name] [name], MD  Active   hydrALAZINE  (APRESOLINE ) 100 MG tablet [id number] Yes Take 1 tablet (100 mg total) by mouth 3 (three) times daily. [name] [name], MD  Active   isosorbide  mononitrate (IMDUR ) 120 MG 24 hr tablet [id number] Yes Take 1 tablet (120 mg total) by mouth daily. [name] [name], MD  Active   sertraline  (ZOLOFT ) 50 MG tablet [id number] Yes Take 1 tablet (50 mg total) by mouth daily. [name] [name], MD  Active   torsemide  (DEMADEX ) 10 MG tablet [id number] Yes Take 1 tablet (10 mg total) by mouth daily as needed (weight gain). [name] [name], MD  Active Self, Pharmacy Records            Recommendation:   {RECOMMENDATONS:32554}  Follow Up Plan:   {FOLLOWUP:32559}  SIG ***

## 2024-02-04 ENCOUNTER — Other Ambulatory Visit: Payer: Self-pay

## 2024-02-04 ENCOUNTER — Other Ambulatory Visit (HOSPITAL_BASED_OUTPATIENT_CLINIC_OR_DEPARTMENT_OTHER): Payer: Self-pay

## 2024-02-04 DIAGNOSIS — F32A Depression, unspecified: Secondary | ICD-10-CM

## 2024-02-04 MED ORDER — SERTRALINE HCL 50 MG PO TABS
50.0000 mg | ORAL_TABLET | Freq: Every day | ORAL | 3 refills | Status: AC
  Filled 2024-02-04: qty 90, 90d supply, fill #0
  Filled 2024-02-04: qty 30, 30d supply, fill #0

## 2024-02-04 NOTE — Patient Instructions (Signed)
 Visit Information  Thank you for taking time to visit with me today. Please don't hesitate to contact me if I can be of assistance to you before our next scheduled appointment.  Your next care management appointment is by telephone on 02/14/24 at 11am    Please call the care guide team at [phone] if you need to cancel, schedule, or reschedule an appointment.   Please call 911 if you are experiencing a Mental Health or Behavioral Health Crisis or need someone to talk to.  [name], BSW [place]  [facility], [facility] Social Worker Direct Dial: [phone]  Fax: [phone] Website: [email or web address]

## 2024-02-04 NOTE — Patient Outreach (Signed)
 Social Drivers of Health  Community Resource and Care Coordination Visit Note   02/04/2024  Name: Mark Hayden MRN: [id number] DOB:June 14, 1975  Situation: Referral received for [facility] needs assessment and assistance related to Housing . I obtained verbal consent from Patient.  Visit completed with Patient on the phone.   Background:   SDOH Interventions Today    Flowsheet Row Most Recent Value  SDOH Interventions   Housing Interventions Other (Comment)  [Patients home condemed.  Patient refused shelter options. Patient provided community resources. Patient will speak to sister about housing.]  Transportation Interventions Other (Comment)  [Patient car is out of service and is borrowing a neighbors car to get to work.]  Financial Strain Interventions Other (Comment)  [place]     Assessment:   Goals Addressed             This Visit's Progress    BSW Goals       Current SDOH Barriers:  Housing barriers  Interventions: Patient interviewed and appropriate screenings performed Referred patient to community resources  Advised patient to discuss housing with sister that lives in [place]. SW suggested asking sister to pick him up from work at 10 pm and patient pay for shared ride service to get to work since the bus [place] the [place]. SW reminded patient he could be arrested if he continues to remain in a condemned home. Patient can't stay with daughter at this time due to another crisis she is having.          Recommendation:   follow up with sister regarding housing needs  Follow Up Plan:   Telephone follow up appointment date/time:  02/14/24 at 11am  [name], BSW [place]  [facility], [facility] Social Worker Direct Dial: [phone]  Fax: [phone] Website: [email or web address]

## 2024-02-04 NOTE — Telephone Encounter (Signed)
 Called patient and verbalized understanding. Will call back if he is not able to schedule

## 2024-02-14 ENCOUNTER — Other Ambulatory Visit: Payer: Self-pay

## 2024-02-14 NOTE — Patient Instructions (Signed)

## 2024-02-14 NOTE — Patient Outreach (Signed)
 Social Drivers of Health  Community Resource and Care Coordination Visit Note   02/14/2024  Name: Mark Hayden MRN: [id number] DOB:08-11-1975  Situation: Referral received for [facility] needs assessment and assistance related to Housing . I obtained verbal consent from Patient.  Visit completed with Patient on the phone.   Background:      Assessment:   Goals Addressed             This Visit's Progress    COMPLETED: BSW Goals       Current SDOH Barriers:  Housing barriers  Interventions: Patient interviewed and appropriate screenings performed Referred patient to community resources  Provided patient with information about Medicaid transportation Advised patient to contact Medicaid for transportation in the future.   Patient plans to remain in the condemned trailer for as long as possible so that he can get to work, with hopes to save money for a car. Patients sister and friend agreed to assist with housing but patient wants to continue to work for as long as possible.  Transportation to work is an issue from his sister and friends home and patient doesn't want to burden them.  Doctor visit is within walking distance and would not need Medicaid transportation unless he moves. Close case            Recommendation:   follow up with family/friends regarding housing needs and contact Medicaid for transportation.  Follow Up Plan:   Patient has achieved all patient stated goals. [employer] will be closed. Patient has been provided contact information should new needs arise.   [name], BSW [place]  [facility] Social Worker Direct Dial: [phone]  Fax: [phone] Website: [email or web address]

## 2024-02-18 ENCOUNTER — Other Ambulatory Visit: Payer: Self-pay

## 2024-02-23 ENCOUNTER — Other Ambulatory Visit: Payer: Self-pay

## 2024-02-23 ENCOUNTER — Emergency Department (HOSPITAL_BASED_OUTPATIENT_CLINIC_OR_DEPARTMENT_OTHER)

## 2024-02-23 ENCOUNTER — Inpatient Hospital Stay (HOSPITAL_BASED_OUTPATIENT_CLINIC_OR_DEPARTMENT_OTHER)
Admission: EM | Admit: 2024-02-23 | Discharge: 2024-02-27 | DRG: 264 | Disposition: A | Attending: Family Medicine | Admitting: Family Medicine

## 2024-02-23 ENCOUNTER — Encounter (HOSPITAL_BASED_OUTPATIENT_CLINIC_OR_DEPARTMENT_OTHER): Payer: Self-pay | Admitting: Internal Medicine

## 2024-02-23 DIAGNOSIS — I160 Hypertensive urgency: Secondary | ICD-10-CM | POA: Diagnosis not present

## 2024-02-23 DIAGNOSIS — G4733 Obstructive sleep apnea (adult) (pediatric): Secondary | ICD-10-CM | POA: Diagnosis present

## 2024-02-23 DIAGNOSIS — I10 Essential (primary) hypertension: Secondary | ICD-10-CM | POA: Diagnosis not present

## 2024-02-23 DIAGNOSIS — F32A Depression, unspecified: Secondary | ICD-10-CM | POA: Diagnosis present

## 2024-02-23 DIAGNOSIS — R0602 Shortness of breath: Secondary | ICD-10-CM | POA: Diagnosis not present

## 2024-02-23 DIAGNOSIS — N184 Chronic kidney disease, stage 4 (severe): Secondary | ICD-10-CM | POA: Diagnosis present

## 2024-02-23 DIAGNOSIS — I517 Cardiomegaly: Secondary | ICD-10-CM | POA: Diagnosis not present

## 2024-02-23 DIAGNOSIS — N185 Chronic kidney disease, stage 5: Secondary | ICD-10-CM | POA: Diagnosis present

## 2024-02-23 DIAGNOSIS — R7989 Other specified abnormal findings of blood chemistry: Secondary | ICD-10-CM | POA: Diagnosis present

## 2024-02-23 DIAGNOSIS — I5032 Chronic diastolic (congestive) heart failure: Secondary | ICD-10-CM | POA: Diagnosis present

## 2024-02-23 DIAGNOSIS — R112 Nausea with vomiting, unspecified: Secondary | ICD-10-CM

## 2024-02-23 DIAGNOSIS — I5043 Acute on chronic combined systolic (congestive) and diastolic (congestive) heart failure: Secondary | ICD-10-CM

## 2024-02-23 DIAGNOSIS — Z91148 Patient's other noncompliance with medication regimen for other reason: Secondary | ICD-10-CM

## 2024-02-23 LAB — CBC WITH DIFFERENTIAL/PLATELET
Abs Immature Granulocytes: 0.03 K/uL (ref 0.00–0.07)
Basophils Absolute: 0 K/uL (ref 0.0–0.1)
Basophils Relative: 0 %
Eosinophils Absolute: 0.2 K/uL (ref 0.0–0.5)
Eosinophils Relative: 4 %
HCT: 28.6 % — ABNORMAL LOW (ref 39.0–52.0)
Hemoglobin: 9.2 g/dL — ABNORMAL LOW (ref 13.0–17.0)
Immature Granulocytes: 1 %
Lymphocytes Relative: 12 %
Lymphs Abs: 0.7 K/uL (ref 0.7–4.0)
MCH: 28.8 pg (ref 26.0–34.0)
MCHC: 32.2 g/dL (ref 30.0–36.0)
MCV: 89.4 fL (ref 80.0–100.0)
Monocytes Absolute: 0.3 K/uL (ref 0.1–1.0)
Monocytes Relative: 5 %
Neutro Abs: 5 K/uL (ref 1.7–7.7)
Neutrophils Relative %: 78 %
Platelets: 187 K/uL (ref 150–400)
RBC: 3.2 MIL/uL — ABNORMAL LOW (ref 4.22–5.81)
RDW: 16.4 % — ABNORMAL HIGH (ref 11.5–15.5)
WBC: 6.4 K/uL (ref 4.0–10.5)
nRBC: 0 % (ref 0.0–0.2)

## 2024-02-23 LAB — BASIC METABOLIC PANEL WITH GFR
Anion gap: 15 (ref 5–15)
BUN: 40 mg/dL — ABNORMAL HIGH (ref 6–20)
CO2: 18 mmol/L — ABNORMAL LOW (ref 22–32)
Calcium: 7 mg/dL — ABNORMAL LOW (ref 8.9–10.3)
Chloride: 108 mmol/L (ref 98–111)
Creatinine, Ser: 4.71 mg/dL — ABNORMAL HIGH (ref 0.61–1.24)
GFR, Estimated: 15 mL/min — ABNORMAL LOW (ref >60)
Glucose, Bld: 115 mg/dL — ABNORMAL HIGH (ref 70–99)
Potassium: 3.7 mmol/L (ref 3.5–5.1)
Sodium: 141 mmol/L (ref 135–145)

## 2024-02-23 LAB — RESP PANEL BY RT-PCR (RSV, FLU A&B, COVID)  RVPGX2
Influenza A by PCR: NEGATIVE
Influenza B by PCR: NEGATIVE
Resp Syncytial Virus by PCR: NEGATIVE
SARS Coronavirus 2 by RT PCR: NEGATIVE

## 2024-02-23 LAB — HEPARIN LEVEL (UNFRACTIONATED): Heparin Unfractionated: 0.15 [IU]/mL — ABNORMAL LOW (ref 0.30–0.70)

## 2024-02-23 LAB — MRSA NEXT GEN BY PCR, NASAL: MRSA by PCR Next Gen: NOT DETECTED

## 2024-02-23 LAB — TROPONIN T, HIGH SENSITIVITY
Troponin T High Sensitivity: 123 ng/L (ref 0–19)
Troponin T High Sensitivity: 124 ng/L (ref 0–19)

## 2024-02-23 LAB — PRO BRAIN NATRIURETIC PEPTIDE: Pro Brain Natriuretic Peptide: 25589 pg/mL — ABNORMAL HIGH (ref <300.0)

## 2024-02-23 MED ORDER — FUROSEMIDE 10 MG/ML IJ SOLN
40.0000 mg | Freq: Every day | INTRAMUSCULAR | Status: AC
  Administered 2024-02-24: 40 mg via INTRAVENOUS
  Filled 2024-02-23: qty 4

## 2024-02-23 MED ORDER — HYDRALAZINE HCL 20 MG/ML IJ SOLN
10.0000 mg | Freq: Once | INTRAMUSCULAR | Status: AC
  Administered 2024-02-23: 10 mg via INTRAVENOUS
  Filled 2024-02-23: qty 1

## 2024-02-23 MED ORDER — IPRATROPIUM-ALBUTEROL 0.5-2.5 (3) MG/3ML IN SOLN: 3.0000 mL | Freq: Four times a day (QID) | RESPIRATORY_TRACT | Status: AC | PRN

## 2024-02-23 MED ORDER — LABETALOL HCL 5 MG/ML IV SOLN
10.0000 mg | Freq: Once | INTRAVENOUS | Status: AC
  Administered 2024-02-23: 10 mg via INTRAVENOUS
  Filled 2024-02-23: qty 4

## 2024-02-23 MED ORDER — HYDRALAZINE HCL 50 MG PO TABS
100.0000 mg | ORAL_TABLET | Freq: Three times a day (TID) | ORAL | Status: DC
  Administered 2024-02-23 – 2024-02-24 (×3): 100 mg via ORAL
  Filled 2024-02-23 (×3): qty 2

## 2024-02-23 MED ORDER — ONDANSETRON HCL 4 MG/2ML IJ SOLN: 4.0000 mg | Freq: Four times a day (QID) | INTRAMUSCULAR | Status: DC | PRN

## 2024-02-23 MED ORDER — ACETAMINOPHEN 650 MG RE SUPP: 650.0000 mg | Freq: Four times a day (QID) | RECTAL | Status: DC | PRN

## 2024-02-23 MED ORDER — ATORVASTATIN CALCIUM 10 MG PO TABS
20.0000 mg | ORAL_TABLET | Freq: Every day | ORAL | Status: DC
  Administered 2024-02-23 – 2024-02-26 (×4): 20 mg via ORAL
  Filled 2024-02-23 (×4): qty 2

## 2024-02-23 MED ORDER — LABETALOL HCL 5 MG/ML IV SOLN: 10.0000 mg | INTRAVENOUS | Status: DC | PRN

## 2024-02-23 MED ORDER — HEPARIN (PORCINE) 25000 UT/250ML-% IV SOLN
1500.0000 [IU]/h | INTRAVENOUS | Status: DC
  Administered 2024-02-23: 1200 [IU]/h via INTRAVENOUS
  Administered 2024-02-24: 1350 [IU]/h via INTRAVENOUS
  Administered 2024-02-25: 1500 [IU]/h via INTRAVENOUS
  Filled 2024-02-23 (×3): qty 250

## 2024-02-23 MED ORDER — ENSURE PLUS HIGH PROTEIN PO LIQD
237.0000 mL | Freq: Two times a day (BID) | ORAL | Status: DC
  Administered 2024-02-23 – 2024-02-27 (×8): 237 mL via ORAL

## 2024-02-23 MED ORDER — FUROSEMIDE 10 MG/ML IJ SOLN
20.0000 mg | Freq: Once | INTRAMUSCULAR | Status: AC
  Administered 2024-02-23: 20 mg via INTRAVENOUS
  Filled 2024-02-23: qty 2

## 2024-02-23 MED ORDER — ACETAMINOPHEN 325 MG PO TABS
650.0000 mg | ORAL_TABLET | Freq: Four times a day (QID) | ORAL | Status: DC | PRN
  Administered 2024-02-26 – 2024-02-27 (×3): 650 mg via ORAL
  Filled 2024-02-23 (×3): qty 2

## 2024-02-23 MED ORDER — ISOSORBIDE MONONITRATE ER 60 MG PO TB24
120.0000 mg | ORAL_TABLET | Freq: Every day | ORAL | Status: DC
  Administered 2024-02-24 – 2024-02-27 (×4): 120 mg via ORAL
  Filled 2024-02-23 (×4): qty 2

## 2024-02-23 MED ORDER — HEPARIN SODIUM (PORCINE) 5000 UNIT/ML IJ SOLN: 5000.0000 [IU] | Freq: Two times a day (BID) | INTRAMUSCULAR | Status: DC

## 2024-02-23 MED ORDER — ISOSORBIDE MONONITRATE ER 60 MG PO TB24: 120.0000 mg | ORAL_TABLET | Freq: Every day | ORAL | Status: DC

## 2024-02-23 MED ORDER — ONDANSETRON HCL 4 MG PO TABS: 4.0000 mg | ORAL_TABLET | Freq: Four times a day (QID) | ORAL | Status: DC | PRN

## 2024-02-23 MED ORDER — LABETALOL HCL 5 MG/ML IV SOLN
5.0000 mg | INTRAVENOUS | Status: AC | PRN
  Administered 2024-02-23 (×2): 5 mg via INTRAVENOUS
  Filled 2024-02-23 (×2): qty 4

## 2024-02-23 MED ORDER — DOXAZOSIN MESYLATE 4 MG PO TABS: 4.0000 mg | ORAL_TABLET | Freq: Once | ORAL | Status: DC

## 2024-02-23 MED ORDER — ASPIRIN 81 MG PO TBEC
81.0000 mg | DELAYED_RELEASE_TABLET | Freq: Every day | ORAL | Status: DC
  Administered 2024-02-23 – 2024-02-27 (×5): 81 mg via ORAL
  Filled 2024-02-23 (×5): qty 1

## 2024-02-23 MED ORDER — SERTRALINE HCL 25 MG PO TABS
50.0000 mg | ORAL_TABLET | Freq: Every day | ORAL | Status: DC
  Administered 2024-02-23 – 2024-02-27 (×5): 50 mg via ORAL
  Filled 2024-02-23 (×5): qty 2

## 2024-02-23 MED ORDER — FUROSEMIDE 10 MG/ML IJ SOLN
20.0000 mg | Freq: Every day | INTRAMUSCULAR | Status: AC
  Administered 2024-02-23: 20 mg via INTRAVENOUS
  Filled 2024-02-23: qty 2

## 2024-02-23 MED ORDER — GUAIFENESIN 100 MG/5ML PO LIQD: 5.0000 mL | ORAL | Status: DC | PRN

## 2024-02-23 MED ORDER — HEPARIN BOLUS VIA INFUSION
4000.0000 [IU] | Freq: Once | INTRAVENOUS | Status: AC
  Administered 2024-02-23: 4000 [IU] via INTRAVENOUS

## 2024-02-23 MED ORDER — AMLODIPINE BESYLATE 10 MG PO TABS
10.0000 mg | ORAL_TABLET | Freq: Every day | ORAL | Status: DC
  Administered 2024-02-24 – 2024-02-27 (×4): 10 mg via ORAL
  Filled 2024-02-23 (×5): qty 1

## 2024-02-23 MED ORDER — AMLODIPINE BESYLATE 5 MG PO TABS
10.0000 mg | ORAL_TABLET | Freq: Once | ORAL | Status: AC
  Administered 2024-02-23: 10 mg via ORAL
  Filled 2024-02-23: qty 2

## 2024-02-23 MED ORDER — DOXAZOSIN MESYLATE 4 MG PO TABS
4.0000 mg | ORAL_TABLET | Freq: Every day | ORAL | Status: DC
  Administered 2024-02-24 – 2024-02-27 (×4): 4 mg via ORAL
  Filled 2024-02-23 (×4): qty 1

## 2024-02-23 NOTE — Assessment & Plan Note (Signed)
 Check HS troponin Strict I/Os Echo on 08/29/2023: EF estimated at 60-65%. LA moderately dilated; RA mildly dilated

## 2024-02-23 NOTE — H&P (Addendum)
 History and Physical   Mark Hayden FMW:[id number] DOB: [birth year] DOA: 02/23/2024  PCP: [name] [name], MD  Patient coming from: home  Referring provider: Dr. Bernard, EDP Telemedicine provider: Dr. Sherre, [facility]ist Patient location: [facility] Emergency Department at [facility] Referring diagnosis: Hypertensive urgency Patient name and DOB verified: Patient was able to confirm his name: Mark Hayden and date of birth: 1975/06/19. Patient consented to Telemedicine Evaluation: Yes verbal consent given RN virtual assistant: Velinda Sharps, RN Video encounter time and date: 02/23/24 at approximately: 09:25a.  Chief Concern: Shortness of breath, cough  HPI: Mark Hayden is a 48 year old male with history of hypertension, hyperlipidemia, depression, history of medication noncompliance, CKD stage Ivb.  02/23/2024: he presents to ED for chief concerns of shortness of breath for 1 week and dry cough for 3 days.  Most updated vitals in ED at the time of my evaluation showed t 98, rr 37, hr 90, blood pressure 214/126, SpO2 99% on room air.  Serum sodium is 141, potassium 3.7, chloride 108, bicarb 18, BUN of 40, serum creatinine 4.71, eGFR 15, nonfasting blood glucose 115, WBC 6.4, hemoglobin 9.2, platelets of 187.  proBNP was elevated 25,589.   COVID/influenza A/influenza B/RSV PCR were negative.  UA was negative for leukocytes and nitrates.  Positive for 50 of glucose and 30 of protein.  CXR: Was read as interstitial edema with left pleural effusion. EKG: Sinus rhythm with LVH, QTc 499  ED treatment: Amlodipine  10 mg p.o. one-time dose, hydralazine  10 mg p.o. one-time dose, furosemide  20 mg IV one-time dose.  12/7: patient admitted to [facility] service via virtual encounter. ------------------------------- At bedside, via virtual telemedicine encounter, patient was able to confirm his first and last name, his age, his date  of birth.  He reports that he has been short of breath for about 1 week.  He also endorses a productive cough that is productive of clear sometimes white phlegm.  He denies trauma to his person, known sick contacts, changes to his diet.  He reports swelling of his lower extremities though he is unsure if this is chronic or not.  He endorses nausea and vomiting over the last few days.  Of note, yesterday he vomited about 3-4 times and it was clear white sputum.  He reports he last took his blood pressure medicines on Tuesday, 12/2 and he developed a panic attack.  He is unsure if he finished all of his medications that day.  The last time before Tuesday that he took his medication was 11/7.  He reports he does not take his medications daily or regularly due to having panic attacks after he takes his medications.  Social history: He lives at home on his own.  He denies tobacco, EtOH, recreational IV drug use.  He reports he does smoke THC occasionally.  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  ROS: Constitutional: no weight change, no fever ENT/Mouth: no sore throat, no rhinorrhea Eyes: no eye pain, no vision changes Cardiovascular: no chest pain, + dyspnea,  no edema, no palpitations Respiratory: + cough, + sputum, no wheezing Gastrointestinal: no nausea, no vomiting, no diarrhea, no constipation Genitourinary: no urinary incontinence, no dysuria, no hematuria Musculoskeletal: no arthralgias, no myalgias Skin: no skin lesions, no pruritus, Neuro: no weakness, no loss of consciousness, no syncope Psych: no anxiety, no depression, no decrease appetite Heme/Lymph: no bruising, no bleeding  ED Course: Discussed with EDP, patient requiring hospitalization for chief concerns of hypertensive urgency,  suspect secondary to medication noncompliance.  Assessment/Plan  Principal Problem:   Hypertensive urgency Active Problems:   Elevated troponin   Elevated brain natriuretic peptide (BNP)  level   Shortness of breath   Nausea and vomiting   CKD (chronic kidney disease) stage 4, GFR 15-29 ml/min (HCC)   Hypertension   Chronic heart failure with preserved ejection fraction (HFpEF) (HCC)   OSA (obstructive sleep apnea)   Depression   Noncompliance with medications   Assessment and Plan:  * Hypertensive urgency Secondary to medication noncompliance in setting of anxiety with multiple pills Doxazosin  4 mg one-time dose while patient is at [facility] and resumed daily starting on 12/8 Imdur  125 mg p.o. one-time dose per EDP Labetalol  10 mg IV one-time dose while at ED [facility] Labetalol  5 mg every 3 hours as needed for SBP > 180, 1 day ordered Admit to telemetry  Elevated brain natriuretic peptide (BNP) level Check HS troponin Strict I/Os Echo on 08/29/2023: EF estimated at 60-65%. LA moderately dilated; RA mildly dilated  Elevated troponin HS is 123 with RN reporting patient appearing short of breath Heparin  gtt initiated Continue to monitor second HS troponin Given patient's age and history of medication noncompliance, NSTEMI can not be excluded at this time  Shortness of breath I suspect this is multifactorial in setting of heart failure preserved EF and possible viral infection with gastroenteritis Duoneb q6h prn for shortness of breath, wheezing ordered  Nausea and vomiting Unclear etiology at this time, query gastroenteritis in setting of viral respiratory infection Continue symptomatic support with prn ondansetron  Encourage PO intake  Noncompliance with medications Patient reports this is due to anxiety with multiple pills I suggested that he can take 1 pill every 30 minutes in the morning and for his afternoon and evening pills also take 1 pill every 30 minutes if there are multiple pills I also encouraged him to resume his home sertraline  as this will help with chronic anxiety and depression Patient endorses understanding and states that he will try  this  Depression With anxiety He reports he has not been taking his home sertraline   OSA (obstructive sleep apnea) CPAP nightly ordered  Chronic heart failure with preserved ejection fraction (HFpEF) (HCC) With exacerbation at this time in setting of medication noncompliance Status post furosemide  20 mg IV per EDP Patient is making urine Furosemide  20 mg IV at bedtime once (12/7), and furosemide  40 mg daily once for 12/8. Strict I's and O's  Hypertension Home antihypertensive medication resumed for when patient arrives at inpatient hospital bed Amlodipine  10 mg daily, doxazosin  4 mg daily, hydralazine  100 mg tablet 3 times daily, Imdur  120 mg resumed for 12/8  CKD (chronic kidney disease) stage 4, GFR 15-29 ml/min (HCC) At baseline  Chart reviewed.   DVT prophylaxis: Heparin  5000 units subcutaneous twice daily Code Status: Full code Diet: Heart healthy Family Communication: no  Disposition Plan: pending clinical course Consults called: none at this time Admission status: telemetry, inpatient  Past Medical History:  Diagnosis Date   CHF (congestive heart failure) (HCC)    Hypertension    Hypertensive emergency 08/20/2022   Pneumonia    Sleep apnea    Suicidal thoughts 09/08/2019   History reviewed. No pertinent surgical history.  Social History:  reports that he has quit smoking. He has never used smokeless tobacco. He reports current drug use. Drug: Marijuana. He reports that he does not drink alcohol.  Allergies  Allergen Reactions   Fish Allergy Anaphylaxis and Itching  Shellfish Allergy Anaphylaxis and Itching    seafood   Family History  Problem Relation Age of Onset   Hypertension Mother    Diabetes Mother    Heart disease Mother    Hypertension Father    Diabetes Father    Heart disease Sister    Hypertension Sister    Family history: Family history reviewed and pertinent for hypertension in mother, father, and sister.  Prior to Admission  medications   Medication Sig Start Date End Date Taking? Authorizing Provider  amLODipine  (NORVASC ) 10 MG tablet Take 1 tablet (10 mg total) by mouth daily. 01/02/24 04/01/24  [name] [name], MD  aspirin  EC 81 MG tablet Take 1 tablet (81 mg total) by mouth daily. Swallow whole. 09/02/23   Lue Elsie BROCKS, MD  atorvastatin  (LIPITOR) 20 MG tablet Take 1 tablet (20 mg total) by mouth daily. 01/02/24   [name] [name], MD  dapagliflozin  propanediol (FARXIGA ) 10 MG TABS tablet Take 1 tablet (10 mg total) by mouth daily. 01/02/24   [name] [name], MD  doxazosin  (CARDURA ) 4 MG tablet Take 1 tablet (4 mg total) by mouth daily. 01/02/24   [name] [name], MD  hydrALAZINE  (APRESOLINE ) 100 MG tablet Take 1 tablet (100 mg total) by mouth 3 (three) times daily. 01/02/24   [name] [name], MD  isosorbide  mononitrate (IMDUR ) 120 MG 24 hr tablet Take 1 tablet (120 mg total) by mouth daily. 01/02/24   [name] [name], MD  sertraline  (ZOLOFT ) 50 MG tablet Take 1 tablet (50 mg total) by mouth daily. 02/04/24 02/03/25  [name] [name], MD  torsemide  (DEMADEX ) 10 MG tablet Take 1 tablet (10 mg total) by mouth daily as needed (weight gain). 06/07/23   [name] [name], MD   Physical Exam: Vitals:   02/23/24 0800 02/23/24 0930 02/23/24 0953 02/23/24 1200  BP: (!) 179/97 (!) 197/122  (!) 182/109  Pulse: 74 91  76  Resp: 19 20  (!) 28  Temp: 98 F (36.7 C)   (!) 97.4 F (36.3 C)  TempSrc: Oral   Oral  SpO2:  100%  96%  Weight:   86.6 kg   Height:       Physical exam was completed with the assistance of Velinda Sharps, RN, who was present in the room during this portion of the encounter.  Constitutional: appears older than chronological age, mildly anxious Eyes: PERRL, lids and conjunctivae normal ENMT: Mucous membranes are moist. Posterior pharynx clear of any exudate or lesions. Age-appropriate dentition. Hearing appropriate. Neck: normal, supple, no  masses, no thyromegaly Respiratory: Decreased lung sounds the anterior lower lobes, no wheezing. Normal respiratory effort. No accessory muscle use.  Cardiovascular: Regular rate and rhythm, no murmurs. 2-3 + pitting extremity edema.  Abdomen: no tenderness, no masses palpated, no hepatosplenomegaly. Hypoactive bowel sounds.  Musculoskeletal: no clubbing / cyanosis. No joint deformity upper and lower extremities. Good ROM, no contractures, no atrophy. Normal muscle tone.  Skin: no rashes, lesions, ulcers. No induration Neurologic: Sensation intact. Strength 5/5 in all 4.  Psychiatric: Normal judgment and insight. Alert and oriented x 3. Normal mood.   EKG: independently reviewed, showing sinus rhythm with rate of 89, LVH, QTc 499.  Chest x-ray on Admission: I personally reviewed and I agree with radiologist reading as below.  DG Chest Portable 1 View Result Date: 02/23/2024 EXAM: 1 VIEW(S) XRAY OF THE CHEST 02/23/2024 06:57:16 AM COMPARISON: 01/24/2024 CLINICAL HISTORY: The patient presents with shortness of breath. Chest radiograph is requested to  evaluate for cardiopulmonary causes. FINDINGS: LUNGS AND PLEURA: Mild diffuse interstitial opacities, increased since prior exam. Increased small left pleural effusion. No pneumothorax. HEART AND MEDIASTINUM: Mild cardiomegaly. BONES AND SOFT TISSUES: No acute osseous abnormality. IMPRESSION: 1. Left pleural effusion and interstitial edema concerning for congestive heart failure. Electronically signed by: Waddell Calk MD 02/23/2024 07:39 AM EST RP Workstation: [id number]   Labs on Admission: I have personally reviewed following labs.  CBC: Recent Labs  Lab 02/23/24 0639  WBC 6.4  NEUTROABS 5.0  HGB 9.2*  HCT 28.6*  MCV 89.4  PLT 187   Basic Metabolic Panel: Recent Labs  Lab 02/23/24 0639  NA 141  K 3.7  CL 108  CO2 18*  GLUCOSE 115*  BUN 40*  CREATININE 4.71*  CALCIUM  7.0*   GFR: Estimated Creatinine Clearance: 21.1 mL/min (A)  (by C-G formula based on SCr of 4.71 mg/dL (H)).  BNP (last 3 results) Recent Labs    02/23/24 0639  PROBNP 25,589.0*   Urine analysis:    Component Value Date/Time   COLORURINE YELLOW 01/24/2024 1456   APPEARANCEUR CLEAR 01/24/2024 1456   LABSPEC 1.012 01/24/2024 1456   PHURINE 5.0 01/24/2024 1456   GLUCOSEU 50 (A) 01/24/2024 1456   HGBUR NEGATIVE 01/24/2024 1456   BILIRUBINUR NEGATIVE 01/24/2024 1456   KETONESUR NEGATIVE 01/24/2024 1456   PROTEINUR 30 (A) 01/24/2024 1456   NITRITE NEGATIVE 01/24/2024 1456   LEUKOCYTESUR NEGATIVE 01/24/2024 1456   CRITICAL CARE Performed by: Dr. Sherre  Total critical care time: 32 minutes  Critical care time was exclusive of separately billable procedures and treating other patients.  Critical care was necessary to treat or prevent imminent or life-threatening deterioration. Hypertensive urgency  Critical care was time spent personally by me on the following activities: development of treatment plan with patient as nursing, discussions with consultants, evaluation of patient's response to treatment, examination of patient, obtaining history from patient or surrogate, ordering and performing treatments and interventions, ordering and review of laboratory studies, ordering and review of radiographic studies, pulse oximetry and re-evaluation of patient's condition.  This document was prepared using Dragon Voice Recognition software and may include unintentional dictation errors.  Dr. Sherre [facility] Location: [place]  If 7PM-7AM, please contact overnight-coverage provider If 7AM-7PM, please contact day attending provider [email or web address]  02/23/2024, 12:13 PM

## 2024-02-23 NOTE — Progress Notes (Signed)
 ANTICOAGULATION CONSULT NOTE  Pharmacy Consult for Heparin  Indication: chest pain/ACS  Allergies  Allergen Reactions   Fish Allergy Anaphylaxis and Itching   Shellfish Allergy Anaphylaxis and Itching    seafood    Patient Measurements: Height: 5' 9 (175.3 cm) Weight: 86.6 kg (190 lb 14.7 oz) IBW/kg (Calculated) : 70.7 Heparin  Dosing Weight: 86.6 kg  Vital Signs: Temp: 97.4 F (36.3 C) (12/07 1200) Temp Source: Oral (12/07 1200) BP: 182/109 (12/07 1200) Pulse Rate: 76 (12/07 1200)  Labs: Recent Labs    02/23/24 0639  HGB 9.2*  HCT 28.6*  PLT 187  CREATININE 4.71*    Estimated Creatinine Clearance: 21.1 mL/min (A) (by C-G formula based on SCr of 4.71 mg/dL (H)).   Medical History: Past Medical History:  Diagnosis Date   CHF (congestive heart failure) (HCC)    Hypertension    Hypertensive emergency 08/20/2022   Pneumonia    Sleep apnea    Suicidal thoughts 09/08/2019    Medications:  (Not in a hospital admission)  Scheduled:   doxazosin   4 mg Oral Once   isosorbide  mononitrate  120 mg Oral Daily   Infusions:  PRN: guaiFENesin , ipratropium-albuterol   Assessment: 48 yom with a history of HTN, HLD, depression, CKD. Patient is presenting with SOB and cough. Heparin  per pharmacy consult placed for chest pain/ACS.  Patient is not on anticoagulation prior to arrival.  Hgb 9.2 - baseline; plt 187  Goal of Therapy:  Heparin  level 0.3-0.7 units/ml Monitor platelets by anticoagulation protocol: Yes   Plan:  Give IV heparin  4000 units bolus x 1 Start heparin  infusion at 1200 units/hr Check anti-Xa level in 8 hours and daily while on heparin  Continue to monitor H&H and platelets  Dorn Buttner, PharmD, BCPS 02/23/2024 12:14 PM ED Clinical Pharmacist -  [phone]

## 2024-02-23 NOTE — Assessment & Plan Note (Signed)
 CPAP nightly ordered

## 2024-02-23 NOTE — Assessment & Plan Note (Signed)
 HS is 123 with RN reporting patient appearing short of breath Heparin  gtt initiated Continue to monitor second HS troponin Given patient's age and history of medication noncompliance, NSTEMI can not be excluded at this time

## 2024-02-23 NOTE — Assessment & Plan Note (Signed)
 Patient reports this is due to anxiety with multiple pills I suggested that he can take 1 pill every 30 minutes in the morning and for his afternoon and evening pills also take 1 pill every 30 minutes if there are multiple pills I also encouraged him to resume his home sertraline  as this will help with chronic anxiety and depression Patient endorses understanding and states that he will try this

## 2024-02-23 NOTE — Plan of Care (Signed)
   Patient: Mark Hayden MRN: [id number]   DOA: 02/23/2024   Subjective: Patient with no chest pain or dyspnea.  General exam: Appears calm and comfortable. Respiratory system: Clear to auscultation. Respiratory effort normal. Cardiovascular system: S1 & S2 heard, RRR. Gastrointestinal system: Abdomen is nondistended, soft and nontender.  Central nervous system: Alert and oriented. Musculoskeletal: Trace BLE edema. No calf tenderness  Plan of care: Defer to H&P. No changes.  Author: Elgin Lam, MD 02/23/2024  Check [email or web address] for on-call coverage.  Nursing staff, Please call [facility] Admits & Consults System-Wide number on Amion as soon as patient's arrival, so appropriate admitting provider can evaluate the pt.

## 2024-02-23 NOTE — ED Provider Notes (Signed)
 Signed out to check pending labs, response to meds, admission.  Bp is mildly improved from prior. No chest pain. No headache. No new neuro complaints.   Given chf/elev bnp/hypertensive urgency - will consult hospitalists for admission.     Mozes Sagar, MD 02/23/24 [phone]

## 2024-02-23 NOTE — ED Provider Notes (Signed)
 [place] EMERGENCY DEPARTMENT AT [facility] Provider Note   CSN: [id number] Arrival date & time: 02/23/24  9391     Patient presents with: Shortness of Breath   Mark Hayden is a 48 y.o. male.   HPI     This is a 48 year old male with a history of CHF who presents with shortness of breath.  Patient reports 1 week history of shortness of breath.  Reports dry cough.  No fevers.  Has noted some increased lower extremity swelling.  Patient states that he has not been taking any of his medications for the last 2 weeks.  He states that the last 2 times he took his medications that he had panic attacks at home.  He has not called his doctor to discuss this or have adjustments in his medications.  Denies chest pain.  Prior to Admission medications   Medication Sig Start Date End Date Taking? Authorizing Provider  amLODipine  (NORVASC ) 10 MG tablet Take 1 tablet (10 mg total) by mouth daily. 01/02/24 04/01/24  [name] [name], MD  aspirin  EC 81 MG tablet Take 1 tablet (81 mg total) by mouth daily. Swallow whole. 09/02/23   Lue Elsie BROCKS, MD  atorvastatin  (LIPITOR) 20 MG tablet Take 1 tablet (20 mg total) by mouth daily. 01/02/24   [name] [name], MD  dapagliflozin  propanediol (FARXIGA ) 10 MG TABS tablet Take 1 tablet (10 mg total) by mouth daily. 01/02/24   [name] [name], MD  doxazosin  (CARDURA ) 4 MG tablet Take 1 tablet (4 mg total) by mouth daily. 01/02/24   [name] [name], MD  hydrALAZINE  (APRESOLINE ) 100 MG tablet Take 1 tablet (100 mg total) by mouth 3 (three) times daily. 01/02/24   [name] [name], MD  isosorbide  mononitrate (IMDUR ) 120 MG 24 hr tablet Take 1 tablet (120 mg total) by mouth daily. 01/02/24   [name] [name], MD  sertraline  (ZOLOFT ) 50 MG tablet Take 1 tablet (50 mg total) by mouth daily. 02/04/24 02/03/25  [name] [name], MD  torsemide  (DEMADEX ) 10 MG tablet Take 1 tablet (10 mg total) by mouth  daily as needed (weight gain). 06/07/23   [name] [name], MD    Allergies: Fish allergy and Shellfish allergy    Review of Systems  Constitutional:  Negative for fever.  Respiratory:  Positive for cough and shortness of breath.   Cardiovascular:  Positive for leg swelling. Negative for chest pain.  All other systems reviewed and are negative.   Updated Vital Signs BP (!) 228/144   Pulse 92   Temp 98.1 F (36.7 C) (Oral)   Resp (!) 32   Ht 1.753 m (5' 9)   SpO2 99%   BMI 28.21 kg/m   Physical Exam Vitals and nursing note reviewed.  Constitutional:      Appearance: He is well-developed. He is not ill-appearing.  HENT:     Head: Normocephalic and atraumatic.  Eyes:     Pupils: Pupils are equal, round, and reactive to light.  Cardiovascular:     Rate and Rhythm: Normal rate and regular rhythm.     Heart sounds: Normal heart sounds. No murmur heard. Pulmonary:     Effort: Pulmonary effort is normal. No respiratory distress.     Breath sounds: Normal breath sounds. No wheezing.     Comments: No respiratory distress, diminished breath sounds bilateral lower lobes Abdominal:     General: Bowel sounds are normal.     Palpations: Abdomen is soft.  Tenderness: There is no abdominal tenderness. There is no rebound.  Musculoskeletal:     Cervical back: Neck supple.     Right lower leg: Edema present.     Left lower leg: Edema present.     Comments: Trace to 1+ bilateral lower extremity edema, symmetric  Lymphadenopathy:     Cervical: No cervical adenopathy.  Skin:    General: Skin is warm and dry.  Neurological:     Mental Status: He is alert and oriented to person, place, and time.  Psychiatric:        Mood and Affect: Mood normal.     (all labs ordered are listed, but only abnormal results are displayed) Labs Reviewed  CBC WITH DIFFERENTIAL/PLATELET - Abnormal; Notable for the following components:      Result Value   RBC 3.20 (*)    Hemoglobin 9.2 (*)     HCT 28.6 (*)    RDW 16.4 (*)    All other components within normal limits  BASIC METABOLIC PANEL WITH GFR - Abnormal; Notable for the following components:   CO2 18 (*)    Glucose, Bld 115 (*)    BUN 40 (*)    Creatinine, Ser 4.71 (*)    Calcium  7.0 (*)    GFR, Estimated 15 (*)    All other components within normal limits  PRO BRAIN NATRIURETIC PEPTIDE - Abnormal; Notable for the following components:   Pro Brain Natriuretic Peptide 25,589.0 (*)    All other components within normal limits  RESP PANEL BY RT-PCR (RSV, FLU A&B, COVID)  [id number]    EKG: EKG Interpretation Date/Time:  Sunday February 23 2024 06:56:32 EST Ventricular Rate:  89 PR Interval:  162 QRS Duration:  101 QT Interval:  410 QTC Calculation: 499 R Axis:   57  Text Interpretation: Sinus rhythm Probable left ventricular hypertrophy Nonspecific T abnormalities, lateral leads Borderline prolonged QT interval NO SIGNIFICANT CHANGE SINCE LAST TRACING YESTERDAY Confirmed by Bari Pfeiffer ([id number]) on 02/23/2024 7:00:17 AM  Radiology: No results found.   .Critical Care  Performed by: Bari Pfeiffer FALCON, MD Authorized by: Bari Pfeiffer FALCON, MD   Critical care provider statement:    Critical care time (minutes):  31   Critical care was necessary to treat or prevent imminent or life-threatening deterioration of the following conditions: Hypertensive urgency.   Critical care was time spent personally by me on the following activities:  Development of treatment plan with patient or surrogate, discussions with consultants, evaluation of patient's response to treatment, examination of patient, ordering and review of laboratory studies, ordering and review of radiographic studies, ordering and performing treatments and interventions, pulse oximetry, re-evaluation of patient's condition and review of old charts    Medications Ordered in the ED  isosorbide  mononitrate (IMDUR ) 24 hr tablet 120 mg (has no administration in  time range)  amLODipine  (NORVASC ) tablet 10 mg (10 mg Oral Given 02/23/24 0650)  hydrALAZINE  (APRESOLINE ) injection 10 mg (10 mg Intravenous Given 02/23/24 0650)    Clinical Course as of 02/23/24 0714  Sun Feb 23, 2024  0638 Patient notably hypertensive. [CH]  [phone] Patient given home amlodipine  and Imdur .  Given IV hydralazine . [CH]    Clinical Course User Index [CH] Jeimy Bickert, Pfeiffer FALCON, MD                                 Medical Decision Making Amount and/or Complexity of Data Reviewed Labs:  ordered. Radiology: ordered.  Risk Prescription drug management.   This patient presents to the ED for concern of shortness of breath, this involves an extensive number of treatment options, and is a complaint that carries with it a high risk of complications and morbidity.  I considered the following differential and admission for this acute, potentially life threatening condition.  The differential diagnosis includes pneumonia, pneumothorax, CHF, PE, ACS, viral illness  MDM:    This is a 48 year old male who presents for shortness of breath.  Nontoxic-appearing.  No respiratory distress.  Notably hypertensive 220s over 100s.  Has not been taking any of his medications.  Appears slightly volume overloaded but pulmonary exam is fairly clear.  EKG without any acute ischemic or arrhythmic changes.  Labs obtained.  Patient given his home dose of amlodipine  and Imdur .  Given IV hydralazine .  (Labs, imaging, consults)  Labs: I Ordered, and personally interpreted labs.  The pertinent results include: CBC, BMP, BNP  Imaging Studies ordered: I ordered imaging studies including chest x-ray I independently visualized and interpreted imaging. I agree with the radiologist interpretation  Additional history obtained from chart review.  External records from outside source obtained and reviewed including primary care notes  Cardiac Monitoring: The patient was maintained on a cardiac monitor.  If on the  cardiac monitor, I personally viewed and interpreted the cardiac monitored which showed an underlying rhythm of: Sinus  Reevaluation: After the interventions noted above, I reevaluated the patient and found that they have :stayed the same  Social Determinants of Health:  housing insecurity  Disposition: Pending, signed out to oncoming provider  Co morbidities that complicate the patient evaluation  Past Medical History:  Diagnosis Date   CHF (congestive heart failure) (HCC)    Hypertension    Hypertensive emergency 08/20/2022   Pneumonia    Sleep apnea    Suicidal thoughts 09/08/2019     Medicines Meds ordered this encounter  Medications   amLODipine  (NORVASC ) tablet 10 mg   hydrALAZINE  (APRESOLINE ) injection 10 mg   isosorbide  mononitrate (IMDUR ) 24 hr tablet 120 mg    I have reviewed the patients home medicines and have made adjustments as needed  Problem List / ED Course: Problem List Items Addressed This Visit   None Visit Diagnoses       SOB (shortness of breath)    -  Primary     Hypertensive urgency       Relevant Medications   amLODipine  (NORVASC ) tablet 10 mg (Completed)   hydrALAZINE  (APRESOLINE ) injection 10 mg (Completed)   isosorbide  mononitrate (IMDUR ) 24 hr tablet 120 mg (Start on 02/23/2024 10:00 AM)                Final diagnoses:  SOB (shortness of breath)  Hypertensive urgency    ED Discharge Orders     None          Bari Charmaine FALCON, MD 02/23/24 [phone]

## 2024-02-23 NOTE — Assessment & Plan Note (Signed)
 I suspect this is multifactorial in setting of heart failure preserved EF and possible viral infection with gastroenteritis Duoneb q6h prn for shortness of breath, wheezing ordered

## 2024-02-23 NOTE — ED Notes (Signed)
 Cox, DO notified of pt elevated Troponin of 123. Pt denies any cp or SOB. Pt currently resting.

## 2024-02-23 NOTE — ED Notes (Signed)
 Report called to 2C17 RN, Georges

## 2024-02-23 NOTE — Assessment & Plan Note (Signed)
 Unclear etiology at this time, query gastroenteritis in setting of viral respiratory infection Continue symptomatic support with prn ondansetron  Encourage PO intake

## 2024-02-23 NOTE — Assessment & Plan Note (Addendum)
 With exacerbation at this time in setting of medication noncompliance Status post furosemide  20 mg IV per EDP Patient is making urine Furosemide  20 mg IV at bedtime once (12/7), and furosemide  40 mg daily once for 12/8. Strict I's and O's

## 2024-02-23 NOTE — Assessment & Plan Note (Addendum)
 Home antihypertensive medication resumed for when patient arrives at inpatient hospital bed Amlodipine  10 mg daily, doxazosin  4 mg daily, hydralazine  100 mg tablet 3 times daily, Imdur  120 mg resumed for 12/8

## 2024-02-23 NOTE — Hospital Course (Addendum)
 [name] is a 48 year old male with history of hypertension, hyperlipidemia, depression, history of medication noncompliance, CKD stage Ivb.  02/23/2024: he presents to ED for chief concerns of shortness of breath for 1 week and dry cough for 3 days.  Most updated vitals in ED at the time of my evaluation showed t 98, rr 37, hr 90, blood pressure 214/126, SpO2 99% on room air.  Serum sodium is 141, potassium 3.7, chloride 108, bicarb 18, BUN of 40, serum creatinine 4.71, eGFR 15, nonfasting blood glucose 115, WBC 6.4, hemoglobin 9.2, platelets of 187.  proBNP was elevated 25,589.   COVID/influenza A/influenza B/RSV PCR were negative.  UA was negative for leukocytes and nitrates.  Positive for 50 of glucose and 30 of protein.  CXR: Was read as interstitial edema with left pleural effusion. EKG: Sinus rhythm with LVH, QTc 499  ED treatment: Amlodipine  10 mg p.o. one-time dose, hydralazine  10 mg p.o. one-time dose, furosemide  20 mg IV one-time dose.  12/7: patient admitted to [facility] via virtual encounter.

## 2024-02-23 NOTE — Assessment & Plan Note (Deleted)
 Suspect secondary to demand ischemia insetting of hypertensive urgency Strict I/Os Echo on 08/29/2023: EF estimated at 60-65%. LA moderately dilated; RA mildly dilated

## 2024-02-23 NOTE — ED Notes (Signed)
 Called Mark Hayden at [facility] for transport 13:19-TC

## 2024-02-23 NOTE — ED Triage Notes (Signed)
 Pov SOB x1 week, dry cough x 3 days. Has not been able to take medication because it causes him panic attacks Hx of CHF.

## 2024-02-23 NOTE — Assessment & Plan Note (Signed)
 With anxiety He reports he has not been taking his home sertraline 

## 2024-02-23 NOTE — Assessment & Plan Note (Addendum)
 Secondary to medication noncompliance in setting of anxiety with multiple pills Doxazosin  4 mg one-time dose while patient is at [facility] and resumed daily starting on 12/8 Imdur  125 mg p.o. one-time dose per EDP Labetalol  10 mg IV one-time dose while at ED [facility] Labetalol  5 mg every 3 hours as needed for SBP > 180, 1 day ordered Admit to telemetry

## 2024-02-23 NOTE — Plan of Care (Signed)
   Problem: Education: Goal: Knowledge of General Education information will improve Description: Including pain rating scale, medication(s)/side effects and non-pharmacologic comfort measures Outcome: Progressing   Problem: Clinical Measurements: Goal: Ability to maintain clinical measurements within normal limits will improve Outcome: Progressing   Problem: Activity: Goal: Risk for activity intolerance will decrease Outcome: Progressing   Problem: Nutrition: Goal: Adequate nutrition will be maintained Outcome: Progressing   Problem: Pain Managment: Goal: General experience of comfort will improve and/or be controlled Outcome: Progressing   Problem: Safety: Goal: Ability to remain free from injury will improve Outcome: Progressing

## 2024-02-23 NOTE — Assessment & Plan Note (Signed)
 At baseline

## 2024-02-24 ENCOUNTER — Inpatient Hospital Stay (HOSPITAL_COMMUNITY)

## 2024-02-24 DIAGNOSIS — I5032 Chronic diastolic (congestive) heart failure: Secondary | ICD-10-CM

## 2024-02-24 LAB — BASIC METABOLIC PANEL WITH GFR
Anion gap: 7 (ref 5–15)
BUN: 39 mg/dL — ABNORMAL HIGH (ref 6–20)
CO2: 21 mmol/L — ABNORMAL LOW (ref 22–32)
Calcium: 6.6 mg/dL — ABNORMAL LOW (ref 8.9–10.3)
Chloride: 111 mmol/L (ref 98–111)
Creatinine, Ser: 4.71 mg/dL — ABNORMAL HIGH (ref 0.61–1.24)
GFR, Estimated: 15 mL/min — ABNORMAL LOW (ref >60)
Glucose, Bld: 138 mg/dL — ABNORMAL HIGH (ref 70–99)
Potassium: 3.3 mmol/L — ABNORMAL LOW (ref 3.5–5.1)
Sodium: 139 mmol/L (ref 135–145)

## 2024-02-24 LAB — CBC
HCT: 26.3 % — ABNORMAL LOW (ref 39.0–52.0)
Hemoglobin: 8.4 g/dL — ABNORMAL LOW (ref 13.0–17.0)
MCH: 28.8 pg (ref 26.0–34.0)
MCHC: 31.9 g/dL (ref 30.0–36.0)
MCV: 90.1 fL (ref 80.0–100.0)
Platelets: 189 K/uL (ref 150–400)
RBC: 2.92 MIL/uL — ABNORMAL LOW (ref 4.22–5.81)
RDW: 16.9 % — ABNORMAL HIGH (ref 11.5–15.5)
WBC: 6.5 K/uL (ref 4.0–10.5)
nRBC: 0 % (ref 0.0–0.2)

## 2024-02-24 LAB — TROPONIN I (HIGH SENSITIVITY)
Troponin I (High Sensitivity): 106 ng/L (ref <18)
Troponin I (High Sensitivity): 104 ng/L (ref <18)

## 2024-02-24 LAB — HEPARIN LEVEL (UNFRACTIONATED)
Heparin Unfractionated: 0.25 [IU]/mL — ABNORMAL LOW (ref 0.30–0.70)
Heparin Unfractionated: 0.35 [IU]/mL (ref 0.30–0.70)

## 2024-02-24 LAB — BRAIN NATRIURETIC PEPTIDE: B Natriuretic Peptide: 901.7 pg/mL — ABNORMAL HIGH (ref 0.0–100.0)

## 2024-02-24 MED ORDER — NITROGLYCERIN 0.4 MG SL SUBL
0.4000 mg | SUBLINGUAL_TABLET | SUBLINGUAL | Status: DC | PRN
  Administered 2024-02-24: 0.4 mg via SUBLINGUAL
  Filled 2024-02-24: qty 1

## 2024-02-24 MED ORDER — HYDRALAZINE HCL 50 MG PO TABS
100.0000 mg | ORAL_TABLET | Freq: Three times a day (TID) | ORAL | Status: DC
  Administered 2024-02-24 – 2024-02-27 (×8): 100 mg via ORAL
  Filled 2024-02-24 (×8): qty 2

## 2024-02-24 MED ORDER — HYDROMORPHONE HCL 1 MG/ML IJ SOLN: 0.5000 mg | INTRAMUSCULAR | Status: DC | PRN

## 2024-02-24 NOTE — Plan of Care (Signed)
   Problem: Clinical Measurements: Goal: Will remain free from infection Outcome: Progressing Goal: Diagnostic test results will improve Outcome: Progressing Goal: Respiratory complications will improve Outcome: Progressing   Problem: Activity: Goal: Risk for activity intolerance will decrease Outcome: Progressing   Problem: Nutrition: Goal: Adequate nutrition will be maintained Outcome: Progressing   Problem: Coping: Goal: Level of anxiety will decrease Outcome: Progressing

## 2024-02-24 NOTE — Progress Notes (Signed)
 Patient called RN into room stating he was having chest pain, 3 out of 10, MD made aware and came bedside while EKG was being performed, new orders were placed and one Nitroglycerin  tablet was given. Will continue to monitor.

## 2024-02-24 NOTE — Progress Notes (Signed)
 Heart Failure Navigator Progress Note  Assessed for Heart & Vascular TOC clinic readiness.  Patient does not meet criteria due to he is an patient with the Advanced Heart Failure Team. .   Navigator will sign off at this time.   Stephane Haddock, BSN, Scientist, Clinical (histocompatibility And Immunogenetics) Only

## 2024-02-24 NOTE — Progress Notes (Signed)
 PROGRESS NOTE    Mark Hayden  FMW:[id number] DOB: [birth year] DOA: 02/23/2024 PCP: [name] [name], MD   Brief Narrative: Mark Hayden is a 48 y.o. male with a history of hypertension, hyperlipidemia, depression, CKD stage IV.  Patient presented secondary to shortness of breath and cough, found to have evidence of severely elevated blood pressure secondary to medication nonadherence. Antihypertensives resumed.   Assessment and Plan:  Hypertensive urgency Secondary to medication nonadherence. Patient initially restarted on home regimen. Equal blood pressure in each arm this morning. Blood pressure lower than expected with development of chest pain. Low concern for aortic dissection at this time. -Discontinue hydralazine  for now -Continue Imdur , doxazosin , amlodipine  -Cardiology consultation  Elevated BNP Noted. Patient with shortness of breath, which improved. No obvious evidence of fluid overload. Patient was started on Lasix  IV on admission. -Hold off further diuresis for now  Elevated troponin Elevated but flat trend on admission. Initially, patient did not have chest pain, but this developed during hospitalization. Heparin  IV was started on admission. -Continue heparin  IV -Trend troponin -Cardiology consultation  Chronic HFpEF Patient with an elevated BNP, however no obvious evidence of acute heart failure. Patient was given Lasix  on admission, however. Last Transthoracic Echocardiogram from June 2025 significant for an LVEF of 60-65% with associated moderate to severe concentric LVH. -Blood pressure control  CKD stage IV Noted. Stable currently. Patient has not been established with nephrology. Unclear if he will be able to be seen by nephrology this admission since his renal function is stable, but he will need outpatient follow-up. TOC consulted for transportation requirements/needs.  Shortness of breath Resolved.  Nausea and  vomiting Resolved.  Medication nonadherence Patient reports episodes of a panic attack when attempting to resume antihypertensives. Currently has no home and lives with his sister. No reliable transportation complicates outpatient follow-up as well. -TOC consultation  Depression Anxiety -Continue Zoloft   OSA -Continue CPAP at bedtime   DVT prophylaxis: Heparin  IV Code Status:   Code Status: Full Code Family Communication: None at bedside Disposition Plan: Discharge pending ongoing specialist recommendations and improvement/stability of blood pressure   Consultants:  Cardiology  Procedures:  None  Antimicrobials: None    Subjective: Patient initially without concerns this morning, but developed substernal sharp chest pain with radiation down to his abdomen. Abdominal pain described more as achy than sharp  Objective: BP (!) 189/118   Pulse 81   Temp 98.2 F (36.8 C) (Oral)   Resp 17   Ht 5' 9 (1.753 m)   Wt 84.9 kg   SpO2 100%   BMI 27.64 kg/m   Examination:  General exam: Appears calm and comfortable. Respiratory system: Clear to auscultation. Respiratory effort normal. Cardiovascular system: S1 & S2 heard, RRR. Equal 2+ radial pulses Gastrointestinal system: Abdomen is nondistended, soft and nontender. Normal bowel sounds heard. Central nervous system: Alert and oriented. No focal neurological deficits. Musculoskeletal: No edema. No calf tenderness Psychiatry: Judgement and insight appear normal. Mood & affect appropriate.    Data Reviewed: I have personally reviewed following labs and imaging studies  CBC Lab Results  Component Value Date   WBC 6.4 02/23/2024   RBC 3.20 (L) 02/23/2024   HGB 9.2 (L) 02/23/2024   HCT 28.6 (L) 02/23/2024   MCV 89.4 02/23/2024   MCH 28.8 02/23/2024   PLT 187 02/23/2024   MCHC 32.2 02/23/2024   RDW 16.4 (H) 02/23/2024   LYMPHSABS 0.7 02/23/2024   MONOABS 0.3 02/23/2024   EOSABS 0.2 02/23/2024   BASOSABS  0.0  02/23/2024     Last metabolic panel Lab Results  Component Value Date   NA 141 02/23/2024   K 3.7 02/23/2024   CL 108 02/23/2024   CO2 18 (L) 02/23/2024   BUN 40 (H) 02/23/2024   CREATININE 4.71 (H) 02/23/2024   GLUCOSE 115 (H) 02/23/2024   GFRNONAA 15 (L) 02/23/2024   GFRAA >60 09/09/2019   CALCIUM  7.0 (L) 02/23/2024   PHOS 3.2 08/20/2022   PROT 6.0 (L) 01/24/2024   ALBUMIN 3.3 (L) 01/24/2024   BILITOT 0.5 01/24/2024   ALKPHOS 85 01/24/2024   AST 21 01/24/2024   ALT 27 01/24/2024   ANIONGAP 15 02/23/2024    GFR: Estimated Creatinine Clearance: 21 mL/min (A) (by C-G formula based on SCr of 4.71 mg/dL (H)).  Recent Results (from the past 240 hours)  Resp panel by RT-PCR (RSV, Flu A&B, Covid) Anterior Nasal Swab     Status: None   Collection Time: 02/23/24  6:39 AM   Specimen: Anterior Nasal Swab  Result Value Ref Range Status   SARS Coronavirus 2 by RT PCR NEGATIVE NEGATIVE Final    Comment: (NOTE) SARS-CoV-2 target nucleic acids are NOT DETECTED.  The SARS-CoV-2 RNA is generally detectable in upper respiratory specimens during the acute phase of infection. The lowest concentration of SARS-CoV-2 viral copies this assay can detect is 138 copies/mL. A negative result does not preclude SARS-Cov-2 infection and should not be used as the sole basis for treatment or other patient management decisions. A negative result may occur with  improper specimen collection/handling, submission of specimen other than nasopharyngeal swab, presence of viral mutation(s) within the areas targeted by this assay, and inadequate number of viral copies(<138 copies/mL). A negative result must be combined with clinical observations, patient history, and epidemiological information. The expected result is Negative.  Fact Sheet for Patients:  bloggercourse.com  Fact Sheet for Healthcare Providers:  seriousbroker.it  This test is no t yet  approved or cleared by the [place]  FDA and  has been authorized for detection and/or diagnosis of SARS-CoV-2 by FDA under an Emergency Use Authorization (EUA). This EUA will remain  in effect (meaning this test can be used) for the duration of the COVID-19 declaration under Section 564(b)(1) of the Act, 21 U.S.C.section 360bbb-3(b)(1), unless the authorization is terminated  or revoked sooner.       Influenza A by PCR NEGATIVE NEGATIVE Final   Influenza B by PCR NEGATIVE NEGATIVE Final    Comment: (NOTE) The Xpert Xpress SARS-CoV-2/FLU/RSV plus assay is intended as an aid in the diagnosis of influenza from Nasopharyngeal swab specimens and should not be used as a sole basis for treatment. Nasal washings and aspirates are unacceptable for Xpert Xpress SARS-CoV-2/FLU/RSV testing.  Fact Sheet for Patients: bloggercourse.com  Fact Sheet for Healthcare Providers: seriousbroker.it  This test is not yet approved or cleared by the [place]  FDA and has been authorized for detection and/or diagnosis of SARS-CoV-2 by FDA under an Emergency Use Authorization (EUA). This EUA will remain in effect (meaning this test can be used) for the duration of the COVID-19 declaration under Section 564(b)(1) of the Act, 21 U.S.C. section 360bbb-3(b)(1), unless the authorization is terminated or revoked.     Resp Syncytial Virus by PCR NEGATIVE NEGATIVE Final    Comment: (NOTE) Fact Sheet for Patients: bloggercourse.com  Fact Sheet for Healthcare Providers: seriousbroker.it  This test is not yet approved or cleared by the [place]  FDA and has been authorized for detection and/or  diagnosis of SARS-CoV-2 by FDA under an Emergency Use Authorization (EUA). This EUA will remain in effect (meaning this test can be used) for the duration of the COVID-19 declaration under Section 564(b)(1) of  the Act, 21 U.S.C. section 360bbb-3(b)(1), unless the authorization is terminated or revoked.  Performed at [facility], [address], [place], [place] [address]   MRSA Next Gen by PCR, Nasal     Status: None   Collection Time: 02/23/24  3:31 PM   Specimen: Nasal Mucosa; Nasal Swab  Result Value Ref Range Status   MRSA by PCR Next Gen NOT DETECTED NOT DETECTED Final    Comment: (NOTE) The GeneXpert MRSA Assay (FDA approved for NASAL specimens only), is one component of a comprehensive MRSA colonization surveillance program. It is not intended to diagnose MRSA infection nor to guide or monitor treatment for MRSA infections. Test performance is not FDA approved in patients less than 43 years old. Performed at [facility], [address], [place], [place] [address]       Radiology Studies: DG Chest Portable 1 View Result Date: 02/23/2024 EXAM: 1 VIEW(S) XRAY OF THE CHEST 02/23/2024 06:57:16 AM COMPARISON: 01/24/2024 CLINICAL HISTORY: The patient presents with shortness of breath. Chest radiograph is requested to evaluate for cardiopulmonary causes. FINDINGS: LUNGS AND PLEURA: Mild diffuse interstitial opacities, increased since prior exam. Increased small left pleural effusion. No pneumothorax. HEART AND MEDIASTINUM: Mild cardiomegaly. BONES AND SOFT TISSUES: No acute osseous abnormality. IMPRESSION: 1. Left pleural effusion and interstitial edema concerning for congestive heart failure. Electronically signed by: Waddell Calk MD 02/23/2024 07:39 AM EST RP Workstation: [id number]      LOS: 1 day    Elgin Lam, MD [facility] 02/24/2024, 10:15 AM   If 7PM-7AM, please contact night-coverage [email or web address]

## 2024-02-24 NOTE — Consult Note (Addendum)
 Cardiology Consultation   Patient ID: Mark Hayden MRN: [id number]; DOB: 1975-05-26  Admit date: 02/23/2024 Date of Consult: 02/24/2024  PCP:  [name] [name], MD   [facility] Providers Cardiologist:  Ria Commander, DO   Patient Profile: Mark Hayden is a 48 y.o. male with a hx of uncontrolled hypertension, chronic HFpEF, CKD, who is being seen 02/24/2024 for the evaluation of elevated troponins and chest pain and CHF at the request of Dr. Avie.  History of Present Illness: Mark Hayden has history of uncontrolled hypertension since at least 2023 where he has had multiple hospital admissions for hypertensive emergency.  Often in the setting of dietary indiscretions and medication noncompliance.  Previously evaluated by advanced heart failure.  We had seen him last 08/2023 for hypertension and elevated troponins.  Reported sharp chest pain centrally at that time.  Overall presentation felt secondary to demand ischemia with a blood pressure of 249/149 upon presentation.  Troponins flat around 274.  Also with known severe LVH, EF overall has been preserved.  Plan was for outpatient cardiac MRI but was not seen outpatient.  Reported GI upset with Coreg .  Today patient been evaluated for chest pain, elevated troponins, CHF.  Blood pressure as high as 230/150 on arrival.  Troponins [phone].  He has been started on IV heparin .  BNP 901.  Given 1 dose of IV Lasix  40 mg.  He had nitro responsive chest pain today.  Blood pressure improved 136/81 for the last reading.  Today patient reports that he has severe anxiety when taking his medications and had a panic attack a couple weeks ago and therefore has not been on any of his blood pressure medications.  Reports chronic symptoms of this with the same pattern previously.  Reports his primary concern was actually cough.  He did have an episode of chest pain today after taking his medications that he associates  with panic attack and was given 1 dose of nitroglycerin  which helped soothe his pain.  Chest pain is nonexertional.  Described it as a sharp sensation without any radiating symptoms and was only mildly short of breath.  He denied any significant peripheral edema orthopnea but did report some DOE and poor functional capacity.  Past Medical History:  Diagnosis Date   CHF (congestive heart failure) (HCC)    Hypertension    Hypertensive emergency 08/20/2022   Pneumonia    Sleep apnea    Suicidal thoughts 09/08/2019    History reviewed. No pertinent surgical history.    Scheduled Meds:  amLODipine   10 mg Oral Daily   aspirin  EC  81 mg Oral Daily   atorvastatin   20 mg Oral QHS   doxazosin   4 mg Oral Daily   feeding supplement  237 mL Oral BID BM   isosorbide  mononitrate  120 mg Oral Daily   sertraline   50 mg Oral Daily   Continuous Infusions:  heparin  1,500 Units/hr (02/24/24 1147)   PRN Meds: acetaminophen  **OR** acetaminophen , guaiFENesin , HYDROmorphone  (DILAUDID ) injection, ipratropium-albuterol , labetalol , nitroGLYCERIN , ondansetron  **OR** ondansetron  (ZOFRAN ) IV  Allergies:    Allergies  Allergen Reactions   Fish Allergy Anaphylaxis and Itching   Shellfish Allergy Anaphylaxis and Itching    seafood    Social History:   Social History   Socioeconomic History   Marital status: Single    Spouse name: Not on file   Number of children: 1   Years of education: Not on file   Highest education level: High school graduate  Occupational History  Occupation: [employer]  Tobacco Use   Smoking status: Former   Smokeless tobacco: Never  Advertising Account Planner   Vaping status: Never Used  Substance and Sexual Activity   Alcohol use: No   Drug use: Yes    Types: Marijuana   Sexual activity: Not Currently  Other Topics Concern   Not on file  Social History Narrative   Not on file   Social Drivers of Health   Financial Resource Strain: High Risk (02/04/2024)   Overall Financial  Resource Strain (CARDIA)    Difficulty of Paying Living Expenses: Very hard  Food Insecurity: Food Insecurity Present (02/23/2024)   Hunger Vital Sign    Worried About Programme Researcher, Broadcasting/film/video in the Last Year: Often true    Ran Out of Food in the Last Year: Often true  Transportation Needs: Unmet Transportation Needs (02/23/2024)   PRAPARE - Administrator, Civil Service (Medical): Yes    Lack of Transportation (Non-Medical): Yes  Physical Activity: Not on file  Stress: Not on file  Social Connections: Not on file  Intimate Partner Violence: Not At Risk (02/23/2024)   Humiliation, Afraid, Rape, and Kick questionnaire    Fear of Current or Ex-Partner: No    Emotionally Abused: No    Physically Abused: No    Sexually Abused: No    Family History:    Family History  Problem Relation Age of Onset   Hypertension Mother    Diabetes Mother    Heart disease Mother    Hypertension Father    Diabetes Father    Heart disease Sister    Hypertension Sister      ROS:  Please see the history of present illness.  All other ROS reviewed and negative.     Physical Exam/Data: Vitals:   02/24/24 0300 02/24/24 0700 02/24/24 0855 02/24/24 1122  BP: (!) 198/126 (!) 189/118 (!) 189/118 136/81  Pulse: 77 81  92  Resp: 20 17  (!) 21  Temp: 98.5 F (36.9 C) 98.2 F (36.8 C)  98.3 F (36.8 C)  TempSrc: Oral Oral  Oral  SpO2: 98% 100%  96%  Weight:      Height:        Intake/Output Summary (Last 24 hours) at 02/24/2024 1351 Last data filed at 02/24/2024 1300 Gross per 24 hour  Intake 306.85 ml  Output 2200 ml  Net -1893.15 ml      02/23/2024    2:44 PM 02/23/2024   12:14 PM 02/23/2024    9:53 AM  Last 3 Weights  Weight (lbs) 187 lb 2.7 oz 190 lb 14.7 oz 190 lb 14.7 oz  Weight (kg) 84.9 kg 86.6 kg 86.6 kg     Body mass index is 27.64 kg/m.  General:  Well nourished, well developed, in no acute distress HEENT: normal Neck: + JVD Vascular: No carotid bruits; Distal pulses 2+  bilaterally Cardiac:  normal S1, S2; RRR; no murmur  Lungs:  clear to auscultation bilaterally, no wheezing, rhonchi or rales  Abd: soft, nontender, no hepatomegaly  Ext: no edema Musculoskeletal:  No deformities, BUE and BLE strength normal and equal Skin: warm and dry  Neuro:  CNs 2-12 intact, no focal abnormalities noted Psych:  Normal affect   EKG:  The EKG was personally reviewed and demonstrates: Sinus rhythm heart rate 89.  Probable LVH.  Nonspecific ST-T wave changes. Telemetry:  Telemetry was personally reviewed and demonstrates: Sinus rhythm  Relevant CV Studies: Echocardiogram 08/29/2023 1. Left ventricular  ejection fraction, by estimation, is 60 to 65%. The  left ventricle has normal function. The left ventricle has no regional  wall motion abnormalities. Moderate to severe concentric left ventricular  hypertrophy. Left ventricular  diastolic parameters are indeterminate. The average left ventricular  global longitudinal strain is -20.3 %. The global longitudinal strain is  normal.   2. Right ventricular systolic function is normal. The right ventricular  size is mildly enlarged. Increased septal RV thickness.   3. Left atrial size was moderately dilated.   4. Right atrial size was mildly dilated.   5. The mitral valve is normal in structure. Mild mitral valve  regurgitation. No evidence of mitral stenosis.   6. The aortic valve is tricuspid. There is mild calcification of the  aortic valve. Aortic valve regurgitation is not visualized. No aortic  stenosis is present.   7. The inferior vena cava is normal in size with greater than 50%  respiratory variability, suggesting right atrial pressure of 3 mmHg.   Comparison(s): No significant change from prior study.   Conclusion(s)/Recommendation(s): Otherwise normal echocardiogram, with  minor abnormalities described in the report.    Laboratory Data: High Sensitivity Troponin:   Recent Labs  Lab 02/24/24 1152   TROPONINIHS 106*     Chemistry Recent Labs  Lab 02/23/24 0639 02/24/24 1006  NA 141 139  K 3.7 3.3*  CL 108 111  CO2 18* 21*  GLUCOSE 115* 138*  BUN 40* 39*  CREATININE 4.71* 4.71*  CALCIUM  7.0* 6.6*  GFRNONAA 15* 15*  ANIONGAP 15 7    No results for input(s): PROT, ALBUMIN, AST, ALT, ALKPHOS, BILITOT in the last 168 hours. Lipids No results for input(s): CHOL, TRIG, HDL, LABVLDL, LDLCALC, CHOLHDL in the last 168 hours.  Hematology Recent Labs  Lab 02/23/24 0639 02/24/24 1006  WBC 6.4 6.5  RBC 3.20* 2.92*  HGB 9.2* 8.4*  HCT 28.6* 26.3*  MCV 89.4 90.1  MCH 28.8 28.8  MCHC 32.2 31.9  RDW 16.4* 16.9*  PLT 187 189   Thyroid No results for input(s): TSH, FREET4 in the last 168 hours.  BNP Recent Labs  Lab 02/23/24 0639 02/24/24 1006  BNP  --  901.7*  PROBNP 25,589.0*  --     DDimer No results for input(s): DDIMER in the last 168 hours.  Radiology/Studies:  DG CHEST PORT 1 VIEW Result Date: 02/24/2024 CLINICAL DATA:  Chest pain. EXAM: PORTABLE CHEST 1 VIEW COMPARISON:  02/23/2024 and CT chest 10/28/2022. FINDINGS: Trachea is midline. Heart is enlarged. Mild hazy opacification in right hemithorax. Streaky densities in the lung bases. Small bilateral pleural effusions. IMPRESSION: Mild congestive heart failure. Electronically Signed   By: Newell Eke M.D.   On: 02/24/2024 12:57   DG Chest Portable 1 View Result Date: 02/23/2024 EXAM: 1 VIEW(S) XRAY OF THE CHEST 02/23/2024 06:57:16 AM COMPARISON: 01/24/2024 CLINICAL HISTORY: The patient presents with shortness of breath. Chest radiograph is requested to evaluate for cardiopulmonary causes. FINDINGS: LUNGS AND PLEURA: Mild diffuse interstitial opacities, increased since prior exam. Increased small left pleural effusion. No pneumothorax. HEART AND MEDIASTINUM: Mild cardiomegaly. BONES AND SOFT TISSUES: No acute osseous abnormality. IMPRESSION: 1. Left pleural effusion and interstitial edema  concerning for congestive heart failure. Electronically signed by: Waddell Calk MD 02/23/2024 07:39 AM EST RP Workstation: [id number]     Assessment and Plan:  Chest pain with elevated troponins  Presentation seems overall noncardiac and attributed to panic attacks in the setting of medication inducing anxiety.  Has nonexertional symptoms,  describes pain as sharp stabbing.  EKG with no acute ST-T wave changes and stable on serial repeats. Troponins [phone] which would argue against ACS.  Not a surprising finding given underlying CKD with uncontrolled hypertension with BP 230/120 on arrival. Would reevaluate his symptoms after he shows compliancy with medications and with adequately controlled blood pressure.  At that time could consider outpatient stress testing, would not do a CTA given degree of CKD.  However, also has an indication for cardiac MRI as well which would likely be suffice if he shows up. Additionally with his underlying CKD would be very wary of doing cardiac catheterization. Likely can stop the IV heparin , will obtain echocardiogram first to ensure no wall motion abnormalities.  Chronic HFpEF Often accompanied with his uncontrolled hypertension presentation.  Fortunately here he appears to be euvolemic, only with isolated JVD and no significant symptoms now.  EF 60 to 65% 08/2023 with moderate to severe concentric LVH likely related to chronic uncontrolled hypertension.  Normal RV.  Moderately dilated LA.  Mild MR. Needs cMRI for further eval but has not shown up outpatient. Appears euvolemic, can stop Lasix   Uncontrolled hypertension Long hx of this with recurrent hospitalizations in the setting of noncompliance and dietary indiscretions.  Documented since 2023. He reports he is noncompliant with this given medications give him severe anxiety/panic attacks.  BP improved today. May consider psychiatric referral. Continue with amlodipine  10 mg daily, doxazosin  4 mg daily.   Would continue hydralazine  100 mg 3 times daily, this was discontinued for some reason?  Continue Imdur  120 mg daily. Of note he reported GI symptoms on carvedilol .  OSA Questionable compliancy on CPAP, likely contributing.  Risk Assessment/Risk Scores:   For questions or updates, please contact [facility] Please consult [email or web address] for contact info under    Signed, Thom LITTIE Sluder, PA-C  02/24/2024 1:51 PM   Patient seen and examined.  Agree with above documentation.  Mark Hayden is a 48 year old male with a history of uncontrolled hypertension, chronic diastolic heart failure, CKD who were consulted for evaluation of troponin elevation and chest pain at the request of Dr. Briana. He has a history of multiple hospital admissions with hypertensive emergency in setting of medication noncompliance and dietary indiscretions.  He was last seen 08/2023 with similar presentation with hypertension and elevated troponins.  Echo showed preserved EF, plan is for outpatient cardiac MRI given severe LVH but he did not follow-up outpatient.  Presented today with chest pain and troponin elevations.  Blood pressure 230/150 on arrival.  Troponins 123 > 124 > 106.  BNP 901.  Creatinine 4.7, which appears about baseline.  He was given IV Lasix  and restarted on home BP meds.  Most recent BP improved to 136/81.  On exam, patient is alert and oriented, regular rate and rhythm, no murmurs, lungs CTAB, no LE edema or JVD.  For his chest pain and troponin elevation, suspect demand ischemia in setting of hypertensive urgency.  Troponins mildly elevated and BP up to 230/120 on arrival.  Will check echocardiogram to rule out any new wall motion abnormality.  He was started on heparin  drip, can continue for now but if no abnormalities on echo will discontinue.  Not a good candidate for ischemic evaluation given his CKD stage IV, approaching ESRD.  He appears euvolemic on exam, can hold off on further IV  Lasix .  Lonni LITTIE Nanas, MD

## 2024-02-24 NOTE — Plan of Care (Signed)
   Problem: Education: Goal: Knowledge of General Education information will improve Description: Including pain rating scale, medication(s)/side effects and non-pharmacologic comfort measures Outcome: Progressing   Problem: Clinical Measurements: Goal: Ability to maintain clinical measurements within normal limits will improve Outcome: Progressing

## 2024-02-24 NOTE — Progress Notes (Signed)
   02/24/24 2210  BiPAP/CPAP/SIPAP  $ Non-Invasive Home Ventilator  Initial  $ Face Mask Medium Yes  BiPAP/CPAP/SIPAP Pt Type Adult  BiPAP/CPAP/SIPAP Resmed  Mask Type Full face mask  Dentures removed? Not applicable  Mask Size Medium  Respiratory Rate 22 breaths/min  PEEP 5 cmH20  FiO2 (%) 21 %  Patient Home Machine No  Patient Home Mask No  Patient Home Tubing No  Auto Titrate No  Device Plugged into RED Power Outlet Yes  BiPAP/CPAP /SiPAP Vitals  Pulse Rate 91  Resp (!) 24  SpO2 99 %  MEWS Score/Color  MEWS Score 1  MEWS Score Color Green

## 2024-02-24 NOTE — Consult Note (Signed)
 [facility] Renal Consultation Note  Requesting MD: Elgin Lam, MD Indication for Consultation:  CKD IV, medical optimization   Chief complaint: Shortness of breath   HPI:  Mark Hayden is a 48 y.o. male w/ PMH HFpEF, anxiety, HTN, CKD who presented with shortness of breath and found to have severe hypertension with elevated troponins. Patient initially given heparin  and lasix  x 2 along with antihypertensives. BNP elevated to 901 on admission.  On home torsemide  though is not consistent with medication use at home.  Was previously referred to nephrology but did not follow up. Of note patient has housing insecurity.   Creatinine, Ser  Date/Time Value Ref Range Status  02/24/2024 10:06 AM 4.71 (H) 0.61 - 1.24 mg/dL Final  87/92/7974 93:60 AM 4.71 (H) 0.61 - 1.24 mg/dL Final  88/92/7974 89:96 AM 4.34 (H) 0.61 - 1.24 mg/dL Final  89/83/7974 96:71 PM 4.23 (H) 0.40 - 1.50 mg/dL Final  91/88/7974 95:50 AM 4.31 (H) 0.61 - 1.24 mg/dL Final  93/83/7974 98:54 AM 4.78 (H) 0.61 - 1.24 mg/dL Final  93/84/7974 94:62 AM 4.60 (H) 0.61 - 1.24 mg/dL Final  93/85/7974 95:72 AM 4.96 (H) 0.61 - 1.24 mg/dL Final  93/86/7974 94:98 AM 5.13 (H) 0.61 - 1.24 mg/dL Final  93/87/7974 95:51 AM 4.90 (H) 0.61 - 1.24 mg/dL Final  93/88/7974 95:44 PM 5.03 (H) 0.61 - 1.24 mg/dL Final  96/74/7974 98:84 PM 4.46 (H) 0.40 - 1.50 mg/dL Final  88/95/7975 87:90 PM 4.58 (H) 0.76 - 1.27 mg/dL Final  89/74/7975 95:78 AM 4.30 (H) 0.61 - 1.24 mg/dL Final  89/75/7975 95:71 AM 4.47 (H) 0.61 - 1.24 mg/dL Final  89/76/7975 96:76 AM 4.15 (H) 0.61 - 1.24 mg/dL Final  89/77/7975 96:73 AM 4.03 (H) 0.61 - 1.24 mg/dL Final  89/78/7975 94:61 PM 4.11 (H) 0.61 - 1.24 mg/dL Final  89/78/7975 98:50 PM 4.50 (H) 0.61 - 1.24 mg/dL Final  89/78/7975 98:99 PM 4.10 (H) 0.61 - 1.24 mg/dL Final  91/73/7975 95:83 PM 3.89 (H) 0.61 - 1.24 mg/dL Final  91/83/7975 89:89 AM 3.58 (H) 0.76 - 1.27 mg/dL Final  91/86/7975 97:89 AM  4.23 (H) 0.61 - 1.24 mg/dL Final  91/87/7975 87:80 AM 3.88 (H) 0.61 - 1.24 mg/dL Final  91/88/7975 90:63 AM 3.77 (H) 0.61 - 1.24 mg/dL Final  93/82/7975 96:53 AM 3.59 (H) 0.61 - 1.24 mg/dL Final  93/83/7975 96:72 AM 3.54 (H) 0.61 - 1.24 mg/dL Final  93/84/7975 94:57 AM 3.43 (H) 0.61 - 1.24 mg/dL Final  93/96/7975 98:74 AM 3.53 (H) 0.61 - 1.24 mg/dL Final  93/97/7975 98:92 AM 3.82 (H) 0.61 - 1.24 mg/dL Final  93/98/7975 97:89 PM 4.01 (H) 0.61 - 1.24 mg/dL Final  93/98/7975 87:47 AM 3.74 (H) 0.61 - 1.24 mg/dL Final  94/68/7975 94:97 PM 3.90 (H) 0.61 - 1.24 mg/dL Final  94/68/7975 94:43 AM 4.12 (H) 0.61 - 1.24 mg/dL Final  93/76/7978 93:75 AM 1.20 0.61 - 1.24 mg/dL Final  93/77/7978 94:48 AM 1.38 (H) 0.61 - 1.24 mg/dL Final  93/78/7978 89:84 PM 1.60 (H) 0.61 - 1.24 mg/dL Final  92/85/7984 98:69 PM 1.02 0.50 - 1.35 mg/dL Final  92/92/7984 94:72 PM 0.97 0.50 - 1.35 mg/dL Final     PMHx:   Past Medical History:  Diagnosis Date   CHF (congestive heart failure) (HCC)    Hypertension    Hypertensive emergency 08/20/2022   Pneumonia    Sleep apnea    Suicidal thoughts 09/08/2019    History reviewed. No pertinent surgical history.  Family Hx:  Family History  Problem Relation Age of Onset   Hypertension Mother    Diabetes Mother    Heart disease Mother    Hypertension Father    Diabetes Father    Heart disease Sister    Hypertension Sister     Social History:  reports that he has quit smoking. He has never used smokeless tobacco. He reports current drug use. Drug: Marijuana. He reports that he does not drink alcohol.  Allergies:  Allergies  Allergen Reactions   Fish Allergy Anaphylaxis and Itching   Shellfish Allergy Anaphylaxis and Itching    seafood    Medications: Prior to Admission medications   Medication Sig Start Date End Date Taking? Authorizing Provider  amLODipine  (NORVASC ) 10 MG tablet Take 1 tablet (10 mg total) by mouth daily. 01/02/24 04/01/24 Yes [name] [name], MD  atorvastatin  (LIPITOR) 20 MG tablet Take 1 tablet (20 mg total) by mouth daily. 01/02/24  Yes [name] [name], MD  dapagliflozin  propanediol (FARXIGA ) 10 MG TABS tablet Take 1 tablet (10 mg total) by mouth daily. 01/02/24  Yes [name] [name], MD  doxazosin  (CARDURA ) 4 MG tablet Take 1 tablet (4 mg total) by mouth daily. 01/02/24  Yes [name] [name], MD  hydrALAZINE  (APRESOLINE ) 100 MG tablet Take 1 tablet (100 mg total) by mouth 3 (three) times daily. 01/02/24  Yes [name] [name], MD  isosorbide  mononitrate (IMDUR ) 120 MG 24 hr tablet Take 1 tablet (120 mg total) by mouth daily. 01/02/24  Yes [name] [name], MD  sertraline  (ZOLOFT ) 50 MG tablet Take 1 tablet (50 mg total) by mouth daily. 02/04/24 02/03/25 Yes [name] [name], MD  sodium bicarbonate  650 MG tablet Take 650 mg by mouth 2 (two) times daily.   Yes [provider]  torsemide  (DEMADEX ) 10 MG tablet Take 1 tablet (10 mg total) by mouth daily as needed (weight gain). 06/07/23  Yes [name] [name], MD  aspirin  EC 81 MG tablet Take 1 tablet (81 mg total) by mouth daily. Swallow whole. 09/02/23   Lue Elsie BROCKS, MD    I have reviewed the patient's current medications.  Labs:     Latest Ref Rng & Units 02/24/2024   10:06 AM 02/23/2024    6:39 AM 01/24/2024   10:03 AM  BMP  Glucose 70 - 99 mg/dL 861  884  857   BUN 6 - 20 mg/dL 39  40  37   Creatinine 0.61 - 1.24 mg/dL 5.28  5.28  5.65   Sodium 135 - 145 mmol/L 139  141  141   Potassium 3.5 - 5.1 mmol/L 3.3  3.7  3.7   Chloride 98 - 111 mmol/L 111  108  112   CO2 22 - 32 mmol/L 21  18  17    Calcium  8.9 - 10.3 mg/dL 6.6  7.0  7.2     Urinalysis    Component Value Date/Time   COLORURINE YELLOW 01/24/2024 1456   APPEARANCEUR CLEAR 01/24/2024 1456   LABSPEC 1.012 01/24/2024 1456   PHURINE 5.0 01/24/2024 1456   GLUCOSEU 50 (A) 01/24/2024 1456   HGBUR NEGATIVE 01/24/2024 1456   BILIRUBINUR NEGATIVE  01/24/2024 1456   KETONESUR NEGATIVE 01/24/2024 1456   PROTEINUR 30 (A) 01/24/2024 1456   NITRITE NEGATIVE 01/24/2024 1456   LEUKOCYTESUR NEGATIVE 01/24/2024 1456     ROS: Abnormal taste and decreased appetite.  Cough with phlegm, no shortness of breath  Leg cramps with occasional tingles  No abdominal pain  Anxious  mood    Physical Exam: Vitals:   02/24/24 0855 02/24/24 1122  BP: (!) 189/118 136/81  Pulse:  92  Resp:  (!) 21  Temp:  98.3 F (36.8 C)  SpO2:  96%     General: Chronically ill appearing  HEENT: moist  mucous membranes  Heart: RRR Lungs: Normal work of breathing, occasional expiratory wheeze  Abdomen: soft, non distended, non tender  Extremities: No BLE edema  Neuro: Alert and oriented   Assessment/Plan:  CKD IV  Most likely secondary to uncontrolled hypertension. On admission Cr similar to previous at 4.71. GFR has been around 15 for about a year.  Unfortunately was unable to connect with outpatient nephrology.  Was on sodium bicarb 650 BID at home.  - AM BMP  - Will connect with outpatient team  - Would benefit from getting fistula while inpatient as he is likely progressing towards ESRD. Patient has difficult social situation with daughter in [place], but patient has medicaid.  - Will need to involve SW.    2. Secondary Hyperparathyroidism  Ca today 6.6. PTH in October was 633. Most likely patient has secondary hyperparathyroidism due to CKD.  - PTH, vit D - Will start calcitriol .   3. Anemia  Hgb 8.4. No signs of blood loss and hgb similar to prior. Most likely anemia of chronic disease.  - CBC, iron   - Start weekly ESA   4. HFpEF  EF 60-65% in June 2025. At home patient on farxiga  10 mg and torsemide  10 mg as needed. Currently euvolemic. Stop farxiga .  - Continue farxiga .  - Does not need further diuresis inpatient. - Can continue home diuresis per primary team and cardiology discretion.   5. Hypertension  Improved from admission. Important to  maintain control as possible to prevent worsening progression of CKD. Barrier to medication adherence seems to be mood disorders.  - Continue per primary and cardiology teams.   6. Mood Disorders  Seems to be a big factor in his medical presentation affecting his care. Currently on sertraline  with difficulty with adherence. Low calcium  can also contribute to mood issues.  - Consider psychiatry referral or therapy options per primary team.    Areta Saliva 02/24/2024, 2:30 PM

## 2024-02-24 NOTE — Progress Notes (Signed)
 ANTICOAGULATION CONSULT NOTE  Pharmacy Consult for Heparin  Indication: chest pain/ACS  Allergies  Allergen Reactions   Fish Allergy Anaphylaxis and Itching   Shellfish Allergy Anaphylaxis and Itching    seafood    Patient Measurements: Height: 5' 9 (175.3 cm) Weight: 84.9 kg (187 lb 2.7 oz) IBW/kg (Calculated) : 70.7 Heparin  Dosing Weight: 86.6 kg  Vital Signs: Temp: 98 F (36.7 C) (12/08 1500) Temp Source: Oral (12/08 1500) BP: 149/89 (12/08 1500) Pulse Rate: 92 (12/08 1500)  Labs: Recent Labs    02/23/24 0639 02/23/24 2024 02/24/24 1006 02/24/24 1152 02/24/24 1435 02/24/24 1937  HGB 9.2*  --  8.4*  --   --   --   HCT 28.6*  --  26.3*  --   --   --   PLT 187  --  189  --   --   --   HEPARINUNFRC  --  0.15* 0.25*  --   --  0.35  CREATININE 4.71*  --  4.71*  --   --   --   TROPONINIHS  --   --   --  106* 104*  --     Estimated Creatinine Clearance: 21 mL/min (A) (by C-G formula based on SCr of 4.71 mg/dL (H)).   Medical History: Past Medical History:  Diagnosis Date   CHF (congestive heart failure) (HCC)    Hypertension    Hypertensive emergency 08/20/2022   Pneumonia    Sleep apnea    Suicidal thoughts 09/08/2019    Medications:  Medications Prior to Admission  Medication Sig Dispense Refill Last Dose/Taking   amLODipine  (NORVASC ) 10 MG tablet Take 1 tablet (10 mg total) by mouth daily. 90 tablet 3 02/18/2024   atorvastatin  (LIPITOR) 20 MG tablet Take 1 tablet (20 mg total) by mouth daily. 90 tablet 3 02/18/2024   dapagliflozin  propanediol (FARXIGA ) 10 MG TABS tablet Take 1 tablet (10 mg total) by mouth daily. 90 tablet 3 02/18/2024   doxazosin  (CARDURA ) 4 MG tablet Take 1 tablet (4 mg total) by mouth daily. 90 tablet 3 02/18/2024   hydrALAZINE  (APRESOLINE ) 100 MG tablet Take 1 tablet (100 mg total) by mouth 3 (three) times daily. 90 tablet 5 02/18/2024   isosorbide  mononitrate (IMDUR ) 120 MG 24 hr tablet Take 1 tablet (120 mg total) by mouth daily. 90  tablet 3 02/18/2024   sertraline  (ZOLOFT ) 50 MG tablet Take 1 tablet (50 mg total) by mouth daily. 90 tablet 3 02/18/2024   sodium bicarbonate  650 MG tablet Take 650 mg by mouth 2 (two) times daily.   Past Month   torsemide  (DEMADEX ) 10 MG tablet Take 1 tablet (10 mg total) by mouth daily as needed (weight gain). 60 tablet 2 02/18/2024   aspirin  EC 81 MG tablet Take 1 tablet (81 mg total) by mouth daily. Swallow whole. 30 tablet 12 02/18/2024   Scheduled:   amLODipine   10 mg Oral Daily   aspirin  EC  81 mg Oral Daily   atorvastatin   20 mg Oral QHS   doxazosin   4 mg Oral Daily   feeding supplement  237 mL Oral BID BM   isosorbide  mononitrate  120 mg Oral Daily   sertraline   50 mg Oral Daily   Infusions:   heparin  1,500 Units/hr (02/24/24 1147)   PRN: acetaminophen  **OR** acetaminophen , guaiFENesin , HYDROmorphone  (DILAUDID ) injection, ipratropium-albuterol , nitroGLYCERIN , ondansetron  **OR** ondansetron  (ZOFRAN ) IV  Assessment: 48 yom with a history of HTN, HLD, depression, CKD. Patient is presenting with SOB and cough. Heparin  per pharmacy  consult placed for chest pain/ACS. Patient is not on anticoagulation prior to arrival.  PM heparin  level= 0.35 - Therapeutic for goal on current rate of 1500 units/hr.   Goal of Therapy:  Heparin  level 0.3-0.7 units/ml Monitor platelets by anticoagulation protocol: Yes   Plan:  Continue heparin  to 1500 units/hr Follow-up daily Heparin  level and CBC  Harlene Boga, PharmD, BCPS, BCCCP Please refer to [facility] for [facility] Pharmacy numbers 02/24/2024, 8:36 PM

## 2024-02-24 NOTE — Progress Notes (Signed)
 ANTICOAGULATION CONSULT NOTE  Pharmacy Consult for Heparin  Indication: chest pain/ACS  Allergies  Allergen Reactions   Fish Allergy Anaphylaxis and Itching   Shellfish Allergy Anaphylaxis and Itching    seafood    Patient Measurements: Height: 5' 9 (175.3 cm) Weight: 84.9 kg (187 lb 2.7 oz) IBW/kg (Calculated) : 70.7 Heparin  Dosing Weight: 86.6 kg  Vital Signs: Temp: 98.2 F (36.8 C) (12/08 0700) Temp Source: Oral (12/08 0700) BP: 136/81 (12/08 1122) Pulse Rate: 92 (12/08 1122)  Labs: Recent Labs    02/23/24 0639 02/23/24 2024 02/24/24 1006  HGB 9.2*  --  8.4*  HCT 28.6*  --  26.3*  PLT 187  --  189  HEPARINUNFRC  --  0.15* 0.25*  CREATININE 4.71*  --  4.71*    Estimated Creatinine Clearance: 21 mL/min (A) (by C-G formula based on SCr of 4.71 mg/dL (H)).   Medical History: Past Medical History:  Diagnosis Date   CHF (congestive heart failure) (HCC)    Hypertension    Hypertensive emergency 08/20/2022   Pneumonia    Sleep apnea    Suicidal thoughts 09/08/2019    Medications:  Medications Prior to Admission  Medication Sig Dispense Refill Last Dose/Taking   amLODipine  (NORVASC ) 10 MG tablet Take 1 tablet (10 mg total) by mouth daily. 90 tablet 3 02/18/2024   atorvastatin  (LIPITOR) 20 MG tablet Take 1 tablet (20 mg total) by mouth daily. 90 tablet 3 02/18/2024   dapagliflozin  propanediol (FARXIGA ) 10 MG TABS tablet Take 1 tablet (10 mg total) by mouth daily. 90 tablet 3 02/18/2024   doxazosin  (CARDURA ) 4 MG tablet Take 1 tablet (4 mg total) by mouth daily. 90 tablet 3 02/18/2024   hydrALAZINE  (APRESOLINE ) 100 MG tablet Take 1 tablet (100 mg total) by mouth 3 (three) times daily. 90 tablet 5 02/18/2024   isosorbide  mononitrate (IMDUR ) 120 MG 24 hr tablet Take 1 tablet (120 mg total) by mouth daily. 90 tablet 3 02/18/2024   sertraline  (ZOLOFT ) 50 MG tablet Take 1 tablet (50 mg total) by mouth daily. 90 tablet 3 02/18/2024   sodium bicarbonate  650 MG tablet Take 650  mg by mouth 2 (two) times daily.   Past Month   torsemide  (DEMADEX ) 10 MG tablet Take 1 tablet (10 mg total) by mouth daily as needed (weight gain). 60 tablet 2 02/18/2024   aspirin  EC 81 MG tablet Take 1 tablet (81 mg total) by mouth daily. Swallow whole. 30 tablet 12 02/18/2024   Scheduled:   amLODipine   10 mg Oral Daily   aspirin  EC  81 mg Oral Daily   atorvastatin   20 mg Oral QHS   doxazosin   4 mg Oral Daily   feeding supplement  237 mL Oral BID BM   hydrALAZINE   100 mg Oral TID   isosorbide  mononitrate  120 mg Oral Daily   sertraline   50 mg Oral Daily   Infusions:   heparin  1,350 Units/hr (02/24/24 0543)   PRN: acetaminophen  **OR** acetaminophen , guaiFENesin , ipratropium-albuterol , labetalol , nitroGLYCERIN , ondansetron  **OR** ondansetron  (ZOFRAN ) IV  Assessment: 48 yom with a history of HTN, HLD, depression, CKD. Patient is presenting with SOB and cough. Heparin  per pharmacy consult placed for chest pain/ACS. Patient is not on anticoagulation prior to arrival.  -heparin  level= 0.25 and below goal on 1350 units/hr -Hg= 8.4 (low/stable)  Goal of Therapy:  Heparin  level 0.3-0.7 units/ml Monitor platelets by anticoagulation protocol: Yes   Plan:  -Increase heparin  to 1500 units/hr -Heparin  level in 8 hours and daily wth CBC  daily  Prentice Poisson, PharmD Clinical Pharmacist **Pharmacist phone directory can now be found on [email or web address] ([email or web address]).  Listed under [facility].

## 2024-02-25 ENCOUNTER — Inpatient Hospital Stay (HOSPITAL_COMMUNITY)

## 2024-02-25 LAB — CBC
HCT: 23.4 % — ABNORMAL LOW (ref 39.0–52.0)
Hemoglobin: 7.5 g/dL — ABNORMAL LOW (ref 13.0–17.0)
MCH: 29.2 pg (ref 26.0–34.0)
MCHC: 32.1 g/dL (ref 30.0–36.0)
MCV: 91.1 fL (ref 80.0–100.0)
Platelets: 143 K/uL — ABNORMAL LOW (ref 150–400)
RBC: 2.57 MIL/uL — ABNORMAL LOW (ref 4.22–5.81)
RDW: 16.6 % — ABNORMAL HIGH (ref 11.5–15.5)
WBC: 5.6 K/uL (ref 4.0–10.5)
nRBC: 0 % (ref 0.0–0.2)

## 2024-02-25 LAB — BASIC METABOLIC PANEL WITH GFR
Anion gap: 12 (ref 5–15)
BUN: 35 mg/dL — ABNORMAL HIGH (ref 6–20)
CO2: 16 mmol/L — ABNORMAL LOW (ref 22–32)
Calcium: 6.5 mg/dL — ABNORMAL LOW (ref 8.9–10.3)
Chloride: 111 mmol/L (ref 98–111)
Creatinine, Ser: 4.59 mg/dL — ABNORMAL HIGH (ref 0.61–1.24)
GFR, Estimated: 15 mL/min — ABNORMAL LOW (ref >60)
Glucose, Bld: 99 mg/dL (ref 70–99)
Potassium: 3.4 mmol/L — ABNORMAL LOW (ref 3.5–5.1)
Sodium: 139 mmol/L (ref 135–145)

## 2024-02-25 LAB — ECHOCARDIOGRAM COMPLETE
Area-P 1/2: 5.42 cm2
Height: 69 in
MV M vel: 3.37 m/s
MV Peak grad: 45.4 mmHg
S' Lateral: 3 cm
Single Plane A4C EF: 56.9 %
Weight: 2994.73 [oz_av]

## 2024-02-25 LAB — IRON AND TIBC
Iron: 25 ug/dL — ABNORMAL LOW (ref 45–182)
Saturation Ratios: 8 % — ABNORMAL LOW (ref 17.9–39.5)
TIBC: 318 ug/dL (ref 250–450)
UIBC: 293 ug/dL

## 2024-02-25 LAB — MAGNESIUM: Magnesium: 1.8 mg/dL (ref 1.7–2.4)

## 2024-02-25 LAB — HEPARIN LEVEL (UNFRACTIONATED): Heparin Unfractionated: 0.32 [IU]/mL (ref 0.30–0.70)

## 2024-02-25 LAB — FERRITIN: Ferritin: 36 ng/mL (ref 24–336)

## 2024-02-25 MED ORDER — CALCIUM CARBONATE ANTACID 500 MG PO CHEW
400.0000 mg | CHEWABLE_TABLET | Freq: Two times a day (BID) | ORAL | Status: DC
  Administered 2024-02-25 – 2024-02-26 (×3): 400 mg via ORAL
  Filled 2024-02-25 (×4): qty 2

## 2024-02-25 MED ORDER — TORSEMIDE 20 MG PO TABS
20.0000 mg | ORAL_TABLET | Freq: Every day | ORAL | Status: DC
  Administered 2024-02-25 – 2024-02-27 (×3): 20 mg via ORAL
  Filled 2024-02-25 (×3): qty 1

## 2024-02-25 MED ORDER — HYDRALAZINE HCL 20 MG/ML IJ SOLN
10.0000 mg | Freq: Once | INTRAMUSCULAR | Status: AC
  Administered 2024-02-25: 10 mg via INTRAVENOUS
  Filled 2024-02-25: qty 1

## 2024-02-25 MED ORDER — CALCITRIOL 0.5 MCG PO CAPS
0.5000 ug | ORAL_CAPSULE | Freq: Every day | ORAL | Status: DC
  Administered 2024-02-25 – 2024-02-27 (×3): 0.5 ug via ORAL
  Filled 2024-02-25 (×3): qty 1

## 2024-02-25 NOTE — Progress Notes (Signed)
 Echocardiogram 2D Echocardiogram has been performed.  Juliene JINNY Rucks 02/25/2024, 8:36 AM

## 2024-02-25 NOTE — TOC Initial Note (Addendum)
 Transition of Care [facility]) - Initial/Assessment Note    Patient Details  Name: Mark Hayden MRN: [id number] Date of Birth: February 23, 1976  [facility] CM/SW Contact:    Lauraine FORBES Saa, LCSWA Phone Number: 02/25/2024, 3:23 PM  Clinical Narrative:                  3:24 PM CSW introduced self and role to patient. Patient confirmed SDOH needs. CSW provided SDOH resources. Patient confirmed that he resides at home alone and uses insurance coverage for transportation to appointments but expressed need for transportation assistance at discharge to return home. CSW stated that a taxi voucher could be provided at discharge. Patient expressed understanding of the information. Patient stated that he does not receive disability or food stamps but expressed interest in both. CSW provided food stamps application and consulted financial counseling. Per chart review, patient has a PCP and insurance. Patient does not have SNF or HH history. Patient has DME (CPAP) history with [facility]. Patient's preferred pharmacy is [facility]. TOC will continue to follow.  Expected Discharge Plan: Home/Self Care Barriers to Discharge: Continued Medical Work up   Patient Goals and CMS Choice            Expected Discharge Plan and Services In-house Referral: Clinical Social Work Discharge Planning Services: CM Consult   Living arrangements for the past 2 months: Mobile Home                                      Prior Living Arrangements/Services Living arrangements for the past 2 months: Mobile Home Lives with:: Self Patient language and need for interpreter reviewed:: Yes Do you feel safe going back to the place where you live?: Yes      Need for Family Participation in Patient Care: No (Comment) Care giver support system in place?: No (comment) Current home services: DME Criminal Activity/Legal Involvement Pertinent to Current  Situation/Hospitalization: No - Comment as needed  Activities of Daily Living   ADL Screening (condition at time of admission) Independently performs ADLs?: Yes (appropriate for developmental age) Is the patient deaf or have difficulty hearing?: No Does the patient have difficulty seeing, even when wearing glasses/contacts?: No Does the patient have difficulty concentrating, remembering, or making decisions?: No  Permission Sought/Granted Permission sought to share information with : Family Supports Permission granted to share information with : No (Contact information on chart)  Share Information with NAME: Airam Rickard Gandy     Permission granted to share info w Relationship: Daughter  Permission granted to share info w Contact Information: [phone]  Emotional Assessment Appearance:: Appears stated age Attitude/Demeanor/Rapport: Engaged Affect (typically observed): Accepting, Appropriate, Adaptable, Calm, Stable, Pleasant Orientation: : Oriented to Self, Oriented to Place, Oriented to  Time, Oriented to Situation Alcohol / Substance Use: Not Applicable Psych Involvement: No (comment)  Admission diagnosis:  SOB (shortness of breath) [R06.02] Hypertensive urgency [I16.0] Uncontrolled hypertension [I10] Non compliance w medication regimen [Z91.148] Acute on chronic combined systolic and diastolic CHF (congestive heart failure) (HCC) [I50.43] Patient Active Problem List   Diagnosis Date Noted   Hypertensive urgency 02/23/2024   Noncompliance with medications 02/23/2024   Nausea and vomiting 02/23/2024   Shortness of breath 02/23/2024   Elevated brain natriuretic peptide (BNP) level 02/23/2024   Elevated troponin 08/28/2023   OSA (obstructive sleep apnea) 11/15/2022   Depression 11/15/2022   Chronic heart  failure with preserved ejection fraction (HFpEF) (HCC) 11/03/2022   Hypertension 09/10/2022   CKD (chronic kidney disease) stage 4, GFR 15-29 ml/min (HCC) 08/17/2022    PCP:  [name] [name], MD Pharmacy:   [facility] [address] [place] [address] Phone: [phone] Fax: [phone]     Social Drivers of Health (SDOH) Social History: SDOH Screenings   Food Insecurity: Food Insecurity Present (02/23/2024)  Housing: High Risk (02/23/2024)  Transportation Needs: Unmet Transportation Needs (02/23/2024)  Utilities: At Risk (02/23/2024)  Alcohol Screen: Low Risk  (08/20/2022)  Depression (PHQ2-9): High Risk (06/07/2023)  Financial Resource Strain: High Risk (02/04/2024)  Tobacco Use: Medium Risk (02/23/2024)   SDOH Interventions:     Readmission Risk Interventions     No data to display

## 2024-02-25 NOTE — Progress Notes (Signed)
 [facility] Progress Note   Assessment/ Plan:   CKD IV/V, progressive Most likely secondary to uncontrolled hypertension. On admission Cr similar to previous at 4.71. GFR has been around 15 for about a year.  Unfortunately was unable to connect with outpatient nephrology.  Was on sodium bicarb 650 BID at home.  - add back torsemide  40 mg daily - vein mapping + [facility] c/s today    2. Secondary Hyperparathyroidism  Ca today 6.6. PTH in October was 633. Most likely patient has secondary hyperparathyroidism due to CKD.  - PTH, vit D - Will start calcitriol  - TUMS as well   3. Anemia  Hgb 8.4. No signs of blood loss and hgb similar to prior. - CBC, iron  panel- add on today - Start weekly ESA +/- IV iron    4. HFpEF  EF 60-65% in June 2025. At home patient on farxiga  10 mg and torsemide  10 mg as needed. Currently euvolemic. Stop farxiga .  - Continue farxiga .  - Does not need further diuresis inpatient. - Can continue home diuresis per primary team and cardiology discretion.    5. Hypertension  Improved from admission. Important to maintain control as possible to prevent worsening progression of CKD. Barrier to medication adherence seems to be mood disorders.  - Continue per primary and cardiology teams.   Subjective:    Seen in room. Feeling better. Dtr wants him to move to [place].  Discussed- he wants to, and he wants to get prepared for dialysis- d/t social issues, easier to get it done here.  Will do vein mapping and request c/s from [facility]   Objective:   BP (!) 168/97 (BP Location: Right Arm)   Pulse (!) 101   Temp 97.9 F (36.6 C) (Oral)   Resp 19   Ht 5' 9 (1.753 m)   Wt 84.9 kg   SpO2 99%   BMI 27.64 kg/m   Intake/Output Summary (Last 24 hours) at 02/25/2024 1001 Last data filed at 02/24/2024 1900 Gross per 24 hour  Intake 240 ml  Output 500 ml  Net -260 ml   Weight change:   Physical Exam: Gen:NAD CVS: RRR, loud S2 Resp: clear Abd: soft Ext:  trace LE edema ACCESS: none  Imaging: DG CHEST PORT 1 VIEW Result Date: 02/24/2024 CLINICAL DATA:  Chest pain. EXAM: PORTABLE CHEST 1 VIEW COMPARISON:  02/23/2024 and CT chest 10/28/2022. FINDINGS: Trachea is midline. Heart is enlarged. Mild hazy opacification in right hemithorax. Streaky densities in the lung bases. Small bilateral pleural effusions. IMPRESSION: Mild congestive heart failure. Electronically Signed   By: Newell Eke M.D.   On: 02/24/2024 12:57    Labs: BMET Recent Labs  Lab 02/23/24 0639 02/24/24 1006 02/25/24 0150  NA 141 139 139  K 3.7 3.3* 3.4*  CL 108 111 111  CO2 18* 21* 16*  GLUCOSE 115* 138* 99  BUN 40* 39* 35*  CREATININE 4.71* 4.71* 4.59*  CALCIUM  7.0* 6.6* 6.5*   CBC Recent Labs  Lab 02/23/24 0639 02/24/24 1006 02/25/24 0150  WBC 6.4 6.5 5.6  NEUTROABS 5.0  --   --   HGB 9.2* 8.4* 7.5*  HCT 28.6* 26.3* 23.4*  MCV 89.4 90.1 91.1  PLT 187 189 143*    Medications:     amLODipine   10 mg Oral Daily   aspirin  EC  81 mg Oral Daily   atorvastatin   20 mg Oral QHS   doxazosin   4 mg Oral Daily   feeding supplement  237 mL  Oral BID BM   hydrALAZINE   100 mg Oral TID   isosorbide  mononitrate  120 mg Oral Daily   sertraline   50 mg Oral Daily    Gordy Blanch, MD 02/25/2024, 10:01 AM

## 2024-02-25 NOTE — Progress Notes (Signed)
 ANTICOAGULATION CONSULT NOTE  Pharmacy Consult for Heparin  Indication: chest pain/ACS  Allergies  Allergen Reactions   Fish Allergy Anaphylaxis and Itching   Shellfish Allergy Anaphylaxis and Itching    seafood    Patient Measurements: Height: 5' 9 (175.3 cm) Weight: 84.9 kg (187 lb 2.7 oz) IBW/kg (Calculated) : 70.7 Heparin  Dosing Weight: 86.6 kg  Vital Signs: Temp: 97.9 F (36.6 C) (12/09 0730) Temp Source: Oral (12/09 0730) BP: 179/108 (12/09 0416) Pulse Rate: 93 (12/09 0416)  Labs: Recent Labs    02/23/24 0639 02/23/24 2024 02/24/24 1006 02/24/24 1152 02/24/24 1435 02/24/24 1937 02/25/24 0150  HGB 9.2*  --  8.4*  --   --   --  7.5*  HCT 28.6*  --  26.3*  --   --   --  23.4*  PLT 187  --  189  --   --   --  143*  HEPARINUNFRC  --    < > 0.25*  --   --  0.35 0.32  CREATININE 4.71*  --  4.71*  --   --   --  4.59*  TROPONINIHS  --   --   --  106* 104*  --   --    < > = values in this interval not displayed.    Estimated Creatinine Clearance: 21.5 mL/min (A) (by C-G formula based on SCr of 4.59 mg/dL (H)).   Medical History: Past Medical History:  Diagnosis Date   CHF (congestive heart failure) (HCC)    Hypertension    Hypertensive emergency 08/20/2022   Pneumonia    Sleep apnea    Suicidal thoughts 09/08/2019    Medications:  Medications Prior to Admission  Medication Sig Dispense Refill Last Dose/Taking   amLODipine  (NORVASC ) 10 MG tablet Take 1 tablet (10 mg total) by mouth daily. 90 tablet 3 02/18/2024   atorvastatin  (LIPITOR) 20 MG tablet Take 1 tablet (20 mg total) by mouth daily. 90 tablet 3 02/18/2024   dapagliflozin  propanediol (FARXIGA ) 10 MG TABS tablet Take 1 tablet (10 mg total) by mouth daily. 90 tablet 3 02/18/2024   doxazosin  (CARDURA ) 4 MG tablet Take 1 tablet (4 mg total) by mouth daily. 90 tablet 3 02/18/2024   hydrALAZINE  (APRESOLINE ) 100 MG tablet Take 1 tablet (100 mg total) by mouth 3 (three) times daily. 90 tablet 5 02/18/2024    isosorbide  mononitrate (IMDUR ) 120 MG 24 hr tablet Take 1 tablet (120 mg total) by mouth daily. 90 tablet 3 02/18/2024   sertraline  (ZOLOFT ) 50 MG tablet Take 1 tablet (50 mg total) by mouth daily. 90 tablet 3 02/18/2024   sodium bicarbonate  650 MG tablet Take 650 mg by mouth 2 (two) times daily.   Past Month   torsemide  (DEMADEX ) 10 MG tablet Take 1 tablet (10 mg total) by mouth daily as needed (weight gain). 60 tablet 2 02/18/2024   aspirin  EC 81 MG tablet Take 1 tablet (81 mg total) by mouth daily. Swallow whole. 30 tablet 12 02/18/2024   Scheduled:   amLODipine   10 mg Oral Daily   aspirin  EC  81 mg Oral Daily   atorvastatin   20 mg Oral QHS   doxazosin   4 mg Oral Daily   feeding supplement  237 mL Oral BID BM   hydrALAZINE   100 mg Oral TID   isosorbide  mononitrate  120 mg Oral Daily   sertraline   50 mg Oral Daily   Infusions:   heparin  1,500 Units/hr (02/25/24 0012)   PRN: acetaminophen  **OR** acetaminophen , guaiFENesin ,  HYDROmorphone  (DILAUDID ) injection, ipratropium-albuterol , nitroGLYCERIN , ondansetron  **OR** ondansetron  (ZOFRAN ) IV  Assessment: 48 yom with a history of HTN, HLD, depression, CKD. Patient is presenting with SOB and cough. Heparin  per pharmacy consult placed for chest pain/ACS. Patient is not on anticoagulation prior to arrival.  -heparin  level= 0.32 - Therapeutic for goal on current rate of 1500 units/hr.  -echo pending  Goal of Therapy:  Heparin  level 0.3-0.7 units/ml Monitor platelets by anticoagulation protocol: Yes   Plan:  Continue heparin  to 1500 units/hr Follow-up daily Heparin  level and CBC   Prentice Poisson, PharmD Clinical Pharmacist **Pharmacist phone directory can now be found on [email or web address] ([email or web address]).  Listed under [facility].

## 2024-02-25 NOTE — Plan of Care (Signed)
  Problem: Education: Goal: Knowledge of General Education information will improve Description: Including pain rating scale, medication(s)/side effects and non-pharmacologic comfort measures Outcome: Progressing   Problem: Clinical Measurements: Goal: Respiratory complications will improve Outcome: Progressing Goal: Cardiovascular complication will be avoided Outcome: Progressing   Problem: Nutrition: Goal: Adequate nutrition will be maintained Outcome: Progressing   Problem: Elimination: Goal: Will not experience complications related to bowel motility Outcome: Progressing Goal: Will not experience complications related to urinary retention Outcome: Progressing   Problem: Pain Managment: Goal: General experience of comfort will improve and/or be controlled Outcome: Progressing   Problem: Safety: Goal: Ability to remain free from injury will improve Outcome: Progressing   Problem: Skin Integrity: Goal: Risk for impaired skin integrity will decrease Outcome: Progressing

## 2024-02-25 NOTE — Consult Note (Signed)
 Hospital Consult    Reason for Consult: HD access creation  MRN #:  [id number]  History of Present Illness: This is a 48 y.o. male with CKD stage IV and progression.  He is admitted with shortness of breath and chest pain.  Troponins plateaued and overall condition improved with diuresis.  Vascular team consult for HD access creation.  He is not had any significant surgeries or injuries to either upper extremity.  He denies any previous catheters or ports.  Past Medical History:  Diagnosis Date   CHF (congestive heart failure) (HCC)    Hypertension    Hypertensive emergency 08/20/2022   Pneumonia    Sleep apnea    Suicidal thoughts 09/08/2019    History reviewed. No pertinent surgical history.  Allergies  Allergen Reactions   Fish Allergy Anaphylaxis and Itching   Shellfish Allergy Anaphylaxis and Itching    seafood    Prior to Admission medications   Medication Sig Start Date End Date Taking? Authorizing Provider  amLODipine  (NORVASC ) 10 MG tablet Take 1 tablet (10 mg total) by mouth daily. 01/02/24 04/01/24 Yes [name] [name], MD  atorvastatin  (LIPITOR) 20 MG tablet Take 1 tablet (20 mg total) by mouth daily. 01/02/24  Yes [name] [name], MD  dapagliflozin  propanediol (FARXIGA ) 10 MG TABS tablet Take 1 tablet (10 mg total) by mouth daily. 01/02/24  Yes [name] [name], MD  doxazosin  (CARDURA ) 4 MG tablet Take 1 tablet (4 mg total) by mouth daily. 01/02/24  Yes [name] [name], MD  hydrALAZINE  (APRESOLINE ) 100 MG tablet Take 1 tablet (100 mg total) by mouth 3 (three) times daily. 01/02/24  Yes [name] [name], MD  isosorbide  mononitrate (IMDUR ) 120 MG 24 hr tablet Take 1 tablet (120 mg total) by mouth daily. 01/02/24  Yes [name] [name], MD  sertraline  (ZOLOFT ) 50 MG tablet Take 1 tablet (50 mg total) by mouth daily. 02/04/24 02/03/25 Yes [name] [name], MD  sodium bicarbonate  650 MG tablet Take 650 mg by mouth 2 (two) times  daily.   Yes [provider]  torsemide  (DEMADEX ) 10 MG tablet Take 1 tablet (10 mg total) by mouth daily as needed (weight gain). 06/07/23  Yes [name] [name], MD  aspirin  EC 81 MG tablet Take 1 tablet (81 mg total) by mouth daily. Swallow whole. 09/02/23   Lue Elsie BROCKS, MD    Social History   Socioeconomic History   Marital status: Single    Spouse name: Not on file   Number of children: 1   Years of education: Not on file   Highest education level: High school graduate  Occupational History   Occupation: [employer]  Tobacco Use   Smoking status: Former   Smokeless tobacco: Never  Vaping Use   Vaping status: Never Used  Substance and Sexual Activity   Alcohol use: No   Drug use: Yes    Types: Marijuana   Sexual activity: Not Currently  Other Topics Concern   Not on file  Social History Narrative   Not on file   Social Drivers of Health   Financial Resource Strain: High Risk (02/04/2024)   Overall Financial Resource Strain (CARDIA)    Difficulty of Paying Living Expenses: Very hard  Food Insecurity: Food Insecurity Present (02/23/2024)   Hunger Vital Sign    Worried About Running Out of Food in the Last Year: Often true    Ran Out of Food in the Last Year: Often true  Transportation Needs: Unmet Transportation Needs (  02/23/2024)   PRAPARE - Administrator, Civil Service (Medical): Yes    Lack of Transportation (Non-Medical): Yes  Physical Activity: Not on file  Stress: Not on file  Social Connections: Not on file  Intimate Partner Violence: Not At Risk (02/23/2024)   Humiliation, Afraid, Rape, and Kick questionnaire    Fear of Current or Ex-Partner: No    Emotionally Abused: No    Physically Abused: No    Sexually Abused: No    Family History  Problem Relation Age of Onset   Hypertension Mother    Diabetes Mother    Heart disease Mother    Hypertension Father    Diabetes Father    Heart disease Sister    Hypertension  Sister     ROS: Otherwise negative unless mentioned in HPI  Physical Examination  Vitals:   02/25/24 0730 02/25/24 1010  BP: (!) 168/97 (!) 189/107  Pulse: (!) 101   Resp: 19   Temp: 97.9 F (36.6 C)   SpO2: 99%    Body mass index is 27.64 kg/m.  General: no acute distress Cardiac: hemodynamically stable Neuro: alert, no focal deficit Extremities: No edema or cyanosis.  Appears to have a visible left upper extremity cephalic.  No obvious scars or injuries on upper extremities. Vascular:   Right: Palpable radial, brachial  Left: Palpable radial, brachial   Data:   Vein mapping Pending  Updated echo pending Echo from June demonstrated an EF of 60 to 65%, no RWMA, RV function normal, mildly dilated atria.  ASSESSMENT/PLAN: This is a 48 y.o. male CKD stage IV and progression.  He is nearing the need for dialysis and requires permanent access creation.  I spoke with Dr. Gearline and she does not believe that he needs a tunneled dialysis catheter at this time.  He is planning to move to [place]  in the near future.  And they just want to get his permanent access started at this time. - Plan for left arm upper extremity access tomorrow with Dr. Serene, vein mapping pending. - We discussed the risks and benefits of both fistula and graft and he elected to proceed. N.p.o. midnight Consent ordered Please remove IV from left arm   Norman GORMAN Serve MD [facility] [phone] 02/25/2024  10:22 AM

## 2024-02-25 NOTE — Anesthesia Preprocedure Evaluation (Signed)
 Anesthesia Evaluation    Reviewed: Allergy & Precautions, Patient's Chart, lab work & pertinent test results  Airway        Dental   Pulmonary sleep apnea , former smoker          Cardiovascular hypertension, Pt. on medications +CHF    Echo 02/25/24: 1. Left ventricular wall with speckled pattern, would benefit from  cardiac MR to rule out infiltrative disease . Left ventricular ejection  fraction, by estimation, is 55 to 60%. The left ventricle has normal  function. The left ventricle has no regional  wall motion abnormalities. There is severe concentric left ventricular  hypertrophy. Left ventricular diastolic parameters are consistent with  Grade II diastolic dysfunction (pseudonormalization). Elevated left  ventricular end-diastolic pressure. There is   the interventricular septum is flattened in systole, consistent with  right ventricular pressure overload.   2. Right ventricular systolic function is low normal. The right  ventricular size is mildly enlarged. There is moderately elevated  pulmonary artery systolic pressure.   3. Left atrial size was moderately dilated.   4. Right atrial size was moderately dilated.   5. A small pericardial effusion is present. The pericardial effusion is  anterior to the right ventricle.   6. Mitral valve apparatus appears to have a thickened mass at the base.  This was present in prior echo doe 08/2023 and 12/2022 - suspect this is  due to thickened papillary muscle. The mitral valve is normal in  structure. Mild mitral valve regurgitation.  No evidence of mitral stenosis.   7. The tricuspid valve is abnormal. Tricuspid valve regurgitation is  moderate.   8. The aortic valve is normal in structure. Aortic valve regurgitation is  not visualized. Aortic valve sclerosis is present, with no evidence of  aortic valve stenosis.   9. There is borderline dilatation of the aortic root, measuring 36  mm.      Neuro/Psych  PSYCHIATRIC DISORDERS  Depression    negative neurological ROS     GI/Hepatic negative GI ROS,,,(+)     substance abuse  marijuana use  Endo/Other  negative endocrine ROS    Renal/GU ESRFRenal disease     Musculoskeletal negative musculoskeletal ROS (+)    Abdominal   Peds  Hematology  (+) Blood dyscrasia (Plt 143k), anemia   Anesthesia Other Findings   Reproductive/Obstetrics                              Anesthesia Physical Anesthesia Plan  ASA: 3  Anesthesia Plan: Regional   Post-op Pain Management: Tylenol  PO (pre-op)*   Induction: Intravenous  PONV Risk Score and Plan: 1 and TIVA, Dexamethasone, Ondansetron  and Midazolam   Airway Management Planned: Natural Airway and Simple Face Mask  Additional Equipment:   Intra-op Plan:   Post-operative Plan:   Informed Consent:   Plan Discussed with:   Anesthesia Plan Comments:          Anesthesia Quick Evaluation

## 2024-02-25 NOTE — Discharge Instructions (Signed)
 [employer] assistance programs Crisis assistance programs  -[facility]elessness Arts development officer. If you are experiencing homelessness in [place], [place] , your first point of contact should be Pensions consultant. You can reach Coordinated Entry by calling ([phone] ([phone] or by emailing [email or web address] [email or web address].  Community access points: [employer] [phone] [address], [place]) every Tuesday from 9am-10am. [facility] ([address], [place]) every Wednesday from 8am-9am.   - Coordinated Re-entry Jayson Michael: Dial 211 and request. Offers referrals to homeless shelters in the area.    -The [employer] 8594377995) offers several services to local families, as funding allows. The Emergency Assistance Program (EAP), which they administer, provides household goods, free food, clothing, and financial aid to people in need in the [place]  area. The EAP program does have some qualification, and counselors will interview clients for financial assistance by written referral only. Referrals need to be made by the Department of Social Services or by other EAP approved human services agencies or charities in the area.  -Open Door Ministries of [employer], which can be reached at [phone], offers emergency assistance programs for those in need of help, such as food, rent assistance, a soup kitchen, shelter, and clothing. They are based in [facility] [place]  but provide a number of services to those that qualify for assistance.   [place] Department of Social Services may be able to offer temporary financial assistance and cash grants for paying rent and utilities, Help may be provided for local county residents who may be experiencing personal crisis when other resources, including government programs, are not available. Call [phone]  -High [employer] Army is a [employer] agency, The organization can offer emergency assistance for paying rent, Caremark Rx, utilities, food, household products and furniture. They offer extensive emergency and transitional housing for families, children and single women, and also run a Boy's and Dole Food. Thrift Shops, Secondary school teacher, and other aid offered too. [address], [place], [place]  [address], ([phone]  -[place] Low Income Energy Assistance Program -- This is offered for [facility] families. The federal government created [employer] Program provides a one-time cash grant payment to help eligible low-income families pay their electric and heating bills. [address] [place], [place]  [address], [phone]  -[place] Emergency Assistance -- A program offers emergency utility and rent funds for greater [employer] area residents. The program can also provide counseling and referrals to charities and government programs. Also provides food and a free meal program that serves lunch Mondays - Saturdays and dinner seven days per week to individuals in the community. [address], [place], [place]  [address], [phone]  -[employer] - Offers affordable apartment and housing communities across      [place] and [place]. The low income and seniors can access public housing, rental assistance to qualified applicants, and apply for the section 8 rent subsidy program. Other programs include Chiropractor and Engineer, maintenance. [address] [place], [place]  [address], dial [phone].  -The [facility] provides transitional housing to veterans and the disabled. Clients will also access other services too, including assistance in applying for Disability, life skills classes, case management, and assistance in finding permanent housing. [address], [place], [place]  [place] [address], call [phone]  -[place] Transitional Housing through [facility] is for people who were just  evicted or that are formerly homeless. The non-profit will also help then gain self-sufficiency, find a home or apartment to live in, and also provides information on rent assistance when needed. Phone [phone]  -The [place] [employer] [facility] helps low income, elderly, or disabled residents in seven counties in the [place] ([place], [place], [place], [place], [place], [place], [place], and [place]) save energy and reduce their utility bills by improving energy efficiency. Phone [phone].  -[employer] is located in the [place] [facility] in the [employer], [address], [place], [place] [address]. Parking is in the rear of the building. Phone: [phone]   General Email: Mark Hayden  GHC provides free housing counseling assistance in locating affordable rental housing or housing with support services for families and individuals in crisis and the chronically homeless. We provide potential resources for other housing needs like utilities. Our trained counselors also work with clients on budgeting and financial literacy in effort to empower them to take control of their financial situations. [employer] collaborates with homeless service providers and other stakeholders as part of the [employer] COC (Continuum of Care). The (COC) is a regional/local planning body that coordinates housing and services funding for homeless families and individuals. The role of GHC in the COC is through housing counseling to work with people we serve on diversion strategies for those that are at imminent risk of becoming homeless. We also work with the Coordinated Assessment/Entry Specialist who attempts to find temporary solutions and/or connects the people  to Housing First, Rapid Re-housing or transitional housing programs. Our Homelessness Prevention Housing Counselors meet with clients on business days (Monday-Fridays, except scheduled holidays) from 8:30 am to 4:30 pm.  Legal assistance for evictions, foreclosure, and more -If you need free legal advice on civil issues, such as foreclosures, evictions, Electronics engineer, government programs, domestic issues and more, Landscape architect of [place]  [facility]) is a Associate Professor firm that provides free legal services and counsel to lower income people, seniors, disabled, and others, The goal is to ensure everyone has access to justice and fair representation. Call them at ([phone].  [facility] for Housing and Community Studies can provide info about obtaining legal assistance with evictions. Phone ([phone].  Data processing manager  The [employer], [employer]. offers job and Dispensing optician. Resources are focused on helping students obtain the skills and experiences that are necessary to compete in today's challenging and tight job market. The non-profit faith-based community action agency offers internship trainings as well as classroom instruction. Classes are tailored to meet the needs of people in the [facility] region. [place], [place] [address], [phone]  Foreclosure prevention/Debt Services Family Services of the [employer] Credit Counseling Service inludes debt and foreclosure prevention programs for local families. This includes money management, financial advice, budget review and development of a written action plan with a Pensions consultant to help solve specific individual financial problems. In addition, housing and mortgage counselors can also provide pre- and post-purchase homeownership counseling, default resolution counseling (to prevent foreclosure) and reverse mortgage counseling. A Debt Management Program allows  people and families with a high level of credit card or medical debt to consolidate and repay consumer debt and loans to creditors and rebuild positive credit ratings and scores. Contact ([phone] F1555895.  Community clinics in [place] -Health Department [facility]: [address] [place], [place], [address]. [phone].  -Health Department [place] [facility]: [phone]  [address], [facility], [address]. [phone].  -[facility] offers medical care through a group of doctors, pharmacies and other healthcare related agencies that offer services for low income, uninsured adults in [place]. Also offers adult Dental care and assistance with applying for an [employer]. Call [phone].   [facility]. This center provides low-cost health care to those without health insurance. Services offered include an onsite pharmacy. Phone [phone]. [address], [place].  -Medication Assistance Program serves as a link between pharmaceutical companies and patients to provide low cost or free prescription medications. This service is available for residents who meet certain income restrictions and have no insurance coverage. PLEASE CALL [phone] Jonette Nestle) OR [phone] ([place])  -One Step Further: Materials engineer, The [employer] Support & Nutrition Program, [employer]. Call [phone]/ [phone].  Food pantry and assistance -Urban Ministry-Food Bank: [address], [place] [address]. Phone [phone]  -Blessed Table Food Pantry: [address], [place], [place] [address]. [phone].  -Missionary Ministry: has the purpose of visiting the sick and shut-ins and provide for needs in the surrounding communities. Call [phone]. Email: [email or web address] This program provides: Food box for seniors, Financial assistance, Food to meet basic  nutritional needs.  -Meals on Wheels with Senior Resources: [facility] residents age 43 and over who are homebound and unable to obtain and prepare a nutritious meal for themselves are eligible for this service. There may be a waiting list in certain parts of [facility] if the route in that area is full. If you are in [facility] and [place] call [phone] to register. For all other areas call [phone] to register.  -Greater Dietitian: [email or web address]://[email or web address]  TRANSPORTATION: -[employer] Us Department of Health: Call [facility] and [employer] at [phone] for details. AttractionGuides.es  -Access [place]: Access [place] is the [employer] Agency's shared-ride transportation service for eligible riders who have a disability that prevents them from riding the fixed route bus. Call [phone]. Access [place] riders must pay a fare of $1.50 per trip, or may purchase a 10-ride punch card for $14.00 ($1.40 per ride) or a 40-ride punch card for $48.00 ($1.20 per ride).  -The Shepherd's WHEELS rideshare transportation service is provided for senior citizens (60+) who live independently within [place] city limits and are unable to drive or have limited access to transportation. Call [phone] to schedule an appointment.  -[place] Transportation: For Medicare or Medicaid recipients call [phone]?[facility], wheelchair Carloyn Chi, and ambulatory quotes available.   FLEEING VIOLENCE: -Family Services of the [place]- 24/7 Crisis line [phone]) -[facility] Justice Centers: (336) [phone]-[phone])  [place] 2-1-1 is another useful way to locate resources in the community. Visit ShedSizes.ch to find service information online. If you need additional assistance, 2-1-1 Referral Specialists are available 24 hours a day, every day by dialing  2-1-1 or [phone] from any phone. The call is free, confidential, and available in any language.  Affordable Housing Search http://www.nchousingsearch.[facility])   M-F 8a-3p [address] [place]  [place], [place] [address] [phone] Services include: laundry, barbering, support groups, case management, phone & computer access, showers, AA/NA mtgs, mental health/substance abuse nurse, job skills class, disability information, VA assistance, spiritual classes, etc. Winter Shelter available when temperatures are less than 32 degrees.   HOMELESS SHELTERS [place] [facility] Night Shelter at [facility]- Call [phone] ext. [phone]  or ext. [phone]. Located at [address], [place], [place] [address]  [facility] Mens Shelter- Call [phone]. Located at [address], [place] [address].  [facility]- [employer]. Call [phone]. Office located at [address], [place] [address].  Pathways Family Housing through [place] [phone].  [facility] Family Shelter- Call [phone]. Located at [address] [place], [place], [place] [address].  Room at the Inn-For Pregnant mothers. Call [phone]. Located at [address] [place], [address].  [facility]-For men in [place]. Call [phone]. [facility]-Shelter in [place]. Call [phone].  Home of [employer] for [employer] [phone]. Office located at [address] [place], [address].  [employer] be agreeable to help with chores. Call [phone] ext. [phone].  Men's: [address] [place], [place] [address]. Women's: [facility]  [address] [place], [place] [address]  Crisis Services Therapeutic Alternatives Mobile Crisis Management- [phone]  [facility] [address] [place], [place] [address]. Phone: [phone] Rent/Utility Assistance in  [facility]:  INNOVATIVE PATHWAYS [address] [place], [place] [address] [phone] Mon 8:00am - 6:00pm; Tue 8:00am - 6:00pm; Wed 8:00am - 6:00pm; Thu 8:00am - 6:00pm; Fri 8:00am - 6:00pm; Email: [email or web address] Eligibility: Residents of [place], [place], [place], [place], [place] and [place] that meet income limits. Call or text for eligibility screening.   [facility] MINISTRY [address] [place], [place], [place] [address] [phone] (Main: Rental Assistance) [phone] (Main: Utility Assistance) Mon 8:30am - 5:00pm; Tue 8:30am - 5:00pm; Wed 8:30am - 5:00pm; Thu 8:30am - 5:00pm; Fri 8:30am - 5:00pm; Website: http://www.greensborourbanministry.org/emergency-assistance-program Eligibility: People who have an unexpected crisis or emergency that can be verified. Must have some form of income and meet income limits. At the first of the month, only helps with rent/mortgage assistance for those who have court ordered eviction notices. Call for application information. Call for exact documents that will be needed. Examples of documents that may be needed: Photo ID, Social Security cards for everyone in the household, and proof of income for previous 2 months. Copy of eviction notice for rent assistance and copy of final notice for utility assistance. Statements or receipts of bills for previous 2 months.   [facility] - [place] [address], [place], [place] [address] [phone] (Main) [phone] (Alternate) Mon 9:00am - 5:00pm; Tue 9:00am - 5:00pm; Wed 9:00am - 5:00pm; Thu 9:00am - 5:00pm; Fri 9:00am - 5:00pm; Website: http://[email or web address].salvationarmy.org/[place]/emergency-financial-assistance Email: [email or web address] Eligibility: People experiencing a housing crisis with past-due rent and/or utilities and meet income limits. Must be willing to take part in 6 Call or visit website to download  application. Return complete application by mail or email only. Documents: Help with Utilities: Photo ID, proof of household income, copies of monthly bills or receipts, and a final disconnection/shut-off notice. Help with Rent or Mortgage: Photo ID, proof of income, copies of monthly bills or receipts, and eviction notice. Help with Household Goods: Photo ID, proof of household income, copies of monthly bills or receipts, and a fire or flood report.  [facility] - [place] [address] [place], [place] [address] [phone] (Main) Mon 8:00am - 5:00pm; Tue 8:00am - 5:00pm; Wed 8:00am - 5:00pm; Thu 8:00am - 5:00pm; Fri 8:00am - 12:00pm; Website: http://southernusa.salvationarmy.org/[place]/emergency-financial-assistance Email: [email or web address] Call for eligibility information. Apply :Utilities Assistance: Visit office by 8:30am on 1st and 4th Monday of each  month to pick up application. Rent and Mortgage Assistance: Visit office by 8:30am on 2nd and 3rd Monday of each month to pick up application. NOTE: If Monday falls on a holiday applications can be picked up the following Tuesday. Documents required will be listed on application.  [facility] [place] [facility] [phone] (Main) Seen by appointment only. Call for more information. Eligibility: Meet income limits. Apply: Call for information on how to schedule an appointment. Each month there is a specific day to call to schedule an appointment. It is stated on the agency voicemail message. Appointments fill up quickly each month. Documents: Photo ID, copy of current utility bill.  [facility] HANDS [place] [address] [place], [place] [address] [phone] (Main) Tue 9:00am - 4:00pm; Wed 9:00am - 4:00pm; Thu 9:00am - 4:00pm; Website: http://www.[email or web address] Email: helpinghandsclientassistance@gmail .[email or web address] Eligibility: Utility Assistance: Meet income limits and be a Haematologist. [employer] customers do not qualify. Must not have received utility assistance for another agency within the last 90 days. Rent Assistance: Residents of [place] who meet income limits. Must not have received rent assistance for another agency within the last 90 days. Apply: Call to schedule an appointment. Documents: Utility Assistance: Photo ID, City of [employer], copy of lease (if not paying a mortgage), proof of income, and monthly expenses. Rent Assistance: Photo ID, W-9 from the landlord, copy of the lease, proof of income, and a list of monthly expenses.  OPEN DOOR MINISTRIES - [place] [address] [place], [place] [address] [phone] (Main: Help With Rent) [phone] (Main: Help With Utilities) Mon 9:00am - 4:00pm; Tue 9:00am - 4:00pm; Wed 9:00am - 4:00pm; Thu 9:00am - 4:00pm; Fri 9:00am - 4:00pm; Website: MotivationalSites.no Email: [email or web address] Eligibility: People experiencing a financial crisis. Apply: Call to schedule an appointment Wednesday, 7:30am. Documents: Photo ID, Social Security card, proof of income, and proof of address. Other documents may be required, depending on service. Call for more information.  LOW INCOME ENERGY ASSISTANCE PROGRAM DEPARTMENT OF SOCIAL SERVICES - [facility] [address], [place], [place] [address] [phone] (Main) Mon 8:00am - 5:00pm; Tue 8:00am - 5:00pm; Wed 8:00am - 5:00pm; Thu 8:00am - 5:00pm; Fri 8:00am - 5:00pm; Website: [email or web address] Eligibility: Meet income limits and resource guidelines. Each household is only eligible once, even if multiple members apply. Apply: Call to see if funds are available. Visit to complete an application, call to have 1 mailed, or apply online at [email or web address] NOTE: Households with a person age 60  and over or a person with a documented disability can apply beginning December 1. Other households can apply beginning January 1. Documents: Photo ID, birth certificate, proof of household income, copy of utility bill, latest bank statement, the names and Social Security numbers for everyone in the household, and proof of disability if under age 17.  LOW INCOME ENERGY ASSISTANCE PROGRAM DEPARTMENT OF SOCIAL SERVICES - [facility] [address] [place], [place], [place] [address] [phone] (Main) Mon 8:00am - 5:00pm; Tue 8:00am - 5:00pm; Wed 8:00am - 5:00pm; Thu 8:00am - 5:00pm; Fri 8:00am - 5:00pm; Website: [email or web address] Eligibility: Meet income limits and resource guidelines. Each household is only eligible once, even if multiple members apply. Apply: Call to see if funds are available. Visit to complete an application, call to have 1 mailed, or apply online at [email or web address] NOTE: Households with a person age 8 and over or a person with a documented  disability can apply beginning December 1. Other households can apply beginning January 1. Documents: Photo ID, birth certificate, proof of household income, copy of utility bill, latest bank statement, the names and Social Security numbers for everyone in the household, and proof of disability if under age 36.  [facility]  [place] [facility]IST[facility] Address: [address] [place], [place] [address] Phone Number: [phone] Hours of Operation: Residents of [place] can come to obtain food Monday through Friday from 8:30am until 3:30pm. Photo ID and Social Security cards required for all residents of a household. Can come six times a year  THE BLESSED TABLE Address: [address] [place], [place] [address] Phone Number: [phone] Hours of Operation: Operates Tuesday-Friday 10:00 a.m. to 1 p.m. Requirements: Referral from DSS needed. May come 6 times a  year, 30 days apart. Photo ID and SS required for all residents of household.  [facility] MINISTRIES Address: [address] [place], [place] [address] Phone Number: [phone] Hours of Operation: Food pantry is open on the last Saturday of each month from 10:00 am - 12:00 noon. No appointment needed. No qualifications.  [facility] Address: [address] [place], [place] [address] Phone Number: [phone]. [phone] Hours of Operation: Must make reservations to pick up food on Saturdays. Sign ups for Saturday pick up beginning at 8:30 a.m. on Monday morning.  [facility] Address: [address] [place], [place] [address] Phone Number: [phone] Hours of Operation: If you need food, bring proper identification such as a driver's license to receive a bag of food once a month. Requirements: Can come once every 30 days with referral DSS, Holiday representative, Mental health etc. Each referral good for six visits. Photo ID required. *1st visit no referral required.  [facility] Address: [address] [place], [place] [address] Phone Number: [phone]  [place] [facility] Address: [address] [place], [place] [address] Phone Number: [phone] Hours of Operation:  You can register at [email or web address] for free groceries  FREE INDEED FOOD PANTRY Address: [address] [place], [place] [address] Phone Number: [phone] Hours of Operation: Drive through giveaway, first come first served. Every 3rd Saturday 11AM - 1PM  [facility] [address] Address: [address] [place] [address] Phone Number: [phone]   [place]  HAND TO HAND FOOD PANTRY Address: [address] [place], [place] [address] Phone Number: [phone] Hours of Operation: Once a month every 3rd Saturday  [facility] Address: [address] [place], [place] [address] Phone Number: [phone] Hours of  Operation: Distribution happens from 9:00-10:00 a.m. every Saturday.     HELPING HANDS Address: [address] [facility] [address] [place], [place] [address] Phone Number: [phone] Hours of Operation: ONCE a week for the community food distribution held every Tuesday, Wednesday and Thursday from 11 a.m. - 2:00 p.m. Food is available on a first come, first serve basis and varies week to week. No appointment necessary for drive thru pick up.  [facility] Address: [address] [place], [place] [address] Phone Number: [phone] Hours of Operation: Open every 3rd Thursday 9:30 a.m. - 11:00 a.m.  HOPE [facility] Address: [address] [place],  [address] Phone Number: [phone] Hours of Operation: Please call for hours, directions, and questions  [facility] [place] [facility] Address: [address] [place], [place]  [address] Phone Number: [phone] Website: https://www.Hollyguns.co.za Food Finder app: https://findfood.ghpfa.org  [employer] Address: [address]  [place], [place] [address] Phone Number: [phone] Hours of Operation: Contact Bree Harpe. Enrolled Substance Abuse Clients Only  [facility] Address: [address] [place], [address]  Phone Number: [phone] Hours of Operation: Contact Alene Ana. Food pantry open the 3rd Saturday of each month from 9 a.m. -12 p.m. only  [place] [facility] Address: [address] [place], [place] [address] Phone Number: [phone] Hours of Operation: Contact Loletta Ripple. Emergency food bank open on Saturdays by appointment only  [facility] Address: [address] [place], [place] [address] Phone Number: [phone] Hours of Operation: No specific contact person; Anyone can help  [facility] Address: [address] [place], [place] [address] Phone Number: [phone] Hours of Operation: Contact Julia Oats. Agency  gives out a bag of food every Thursday from 2-4 p.m. only, and also provides a community meal every Thursday between 5-6 p.m. Other services provided include rent/mortgage and utility assistance, women's winter shelter, thrift store, and senior adult activities.  [facility] [place] Address: [address] [place], [place] [address] Phone Number: [phone] Hours of Operation: The Emergency Food Assistance Program provides individuals and families with a generous supply of food including meat, fresh vegetables, and nonperishable items. The food box contains five days' worth of food, and each family or individual can receive a box once per month. M, W, Th, Fr 11am-2pm, walk-ins welcome.  [facility] Address: [address] [place], [place] [address]  Phone Number: [phone] Hours of Operation: Contact Asia Blanca Bunch. Tuesdays and Thursdays from 11am - 3pm by appointment only  Sunday, by APPOINTMENT ONLY  [address] of [place], [address] [place], [address], [phone], 3.2 mi from [facility], call in advance for appointment at 10:00am or at 4:00pm, must provide valid photo ID  Monday  9:30am-5:00pm [facility], [address] [place] [id number], [phone], 0.9 mi from [facility], can come four times per year, bring your photo ID and SS cards for other residents of household, will make appointments for those who work and need to come after 5pm  10:00am-12:00noon [employer], [address], [address], [phone], 1.7 mi from [facility], can come once every 60 days per household, need referral from DSS, [employer], etc., bring photo ID and SS card   10:00am-1:00pm [employer] the [facility],  [address] [place], [address], [phone], 7.7 mi from [facility], can come once every thirty days with a referral from DSS, [employer], Mental  Health, etc. -- each referral good for six visits, bring photo ID   10:00am-1:00pm [address], [address], [phone], 4.2 mi from [facility], can come once every 6 months, open to [facility] residents, bring photo ID and copy of a current utility bill in your name, please call first to verify that food is available  6:30pm-8:30pm [facility], [address] [address], [phone], 3.2 mi from [facility], can come once every 30 days, maximum 6 times per year, bring your photo ID and SS numbers for other residents of household  Monday by APPOINTMENT ONLY  [facility], [address] [place], [address],  [phone], 2.5 mi from [facility], call in advance for appointment between 10:00am-2:00pm, bring your photo ID and  SS cards for all residents of household, can come once every 3 months  [facility], [address], [address], [phone], 0.7 mi from [facility], call in advance for appointment, can come once every 30 days, bring your photo ID and SS cards for other residents of household   Tuesday  9:00am-12:00noon [employer], [address], [address], [phone], 1.3 mi from [facility], can come once every 3 months, bring your photo ID and SS numbers for other residents of household   9:00am-1:00pm [facility] [address],  [place], [address], [phone]/Ext [phone], 1.6 mi from [facility], can come once every two weeks  9:30am-5:00pm [employer], [address] [place] [phone], [phone], 0.9 mi from [facility], can come four times per year, bring your photo ID and SS cards for other residents of household, will make appointments for those who work and need to come after 5pm  10:00am-12:00noon [employer], [address]  [place], [address], [phone], 1.7 mi from [facility], can come once every 60 days per household, need referral  from DSS, [employer], etc., bring photo ID and SS card  10:00am-1:00pm [facility], [id number],  [phone], 3.8 mi from [facility], with referral from DSS, may come six times, 30 days apart, bring your photo ID and SS cards for all residents of household   10:00am-1:00pm [address] the [facility], [address] [place], [address], [phone], 7.7 mi from [facility], can come once every thirty days with a referral from DSS, [employer], Mental Health, etc.- each referral good for six visits, bring photo ID   10:00am-1:00pm [address] [address], [phone], 4.2 mi from [facility], can come once every 6 months, open to [facility] residents, bring photo ID and copy of a current utility bill in your name, please call first to verify that food is available  2:00pm-3:30pm [facility], [address], [phone], [phone], 3.7 mi from [facility], can come twelve times per year, one bag per family, bring photo ID   FIRST AND THIRD Tuesdays  10:00am-1:00pm, [address] [place], [address], [phone], 7.2 mi from [facility], can come once every 30 days   Tuesday, WHEN FOOD IS AVAILABLE (call)  12:00noon-2:00pm [facility], [address], [address], [phone], 1.5 mi from  [facility], can come once every 30 days, bring photo ID   Tuesday, by APPOINTMENT ONLY  [facility], [address], [address],  [phone], 2.5 mi from [facility], call in advance for appointment between 1:00pm-4:00pm, bring your photo ID and SS cards for all residents of household, can come once every 3 months   [address] of [address], [address], [address], [phone], 5 mi from [facility], call one day ahead for appointment the next day between 10:00am and  12:00noon, can come once every 3 months, bring your photo ID and must qualify according to family income   [facility], [address] [address], [phone], 0.7 mi from [facility], call in advance for appointment, can come once every 30 days, bring your photo ID and SS cards for other residents of household  [address], [address], [address],  [phone], 5.1 mi from [facility], call between 9:00am and 1:00pm M-F to make appointment. Appointments are scheduled for Tues and Thurs from 10:00am-11:30am, bring photo ID, can come once every 6 months, limit three visits over 18 months, then must have referral  Wednesday  9:30am-5:00pm [facility], [address] [place] [phone], [phone], 0.9 mi from [facility], can come four times per year, bring your photo ID and SS cards for other residents of household, will make appointments for those who work and need to come after 5pm  9:30am-11:30am [employer] [facility], [address], [address], [phone], 6.6 mi from [facility], can come once every 30 days, bring your photo ID, and SS cards for other residents of household, each monthly visit requires a written referral from GUM or DSS with number in household on form  10:00am-12:00noon [address], [address] [place]  [place], [address], [phone], 1.7 mi from [facility], can come once every 60 days per household, need referral from DSS, [employer], etc., bring photo ID and SS card  10:00am-1:00pm [facility], [id number],  [phone], 3.9 mi from [facility], with referral from DSS, may come six times, 30 days apart, bring your photo ID and SS cards for all residents of household   10:00am-1:00pm [address] the [facility], [address] [place], [address], [phone], 7.7 mi from [facility], can come once every thirty days with a  referral from DSS, [employer], Mental Health, etc. - each referral good for six visits, bring photo ID   2:00pm-5:45pm [address] [place], [address], [phone], 3.2 mi from [facility], once every 30 days, first come/first served, limited to first 25, bring photo ID   6:30pm-8:30pm [facility], [address] [address], [phone], 3.2 mi from [facility], can come once every 30 days, maximum 6 times per year, bring your photo ID and SS numbers for other residents of household  FIRST and THIRD Wednesdays   9:00am-12:00noon, [facility], [address], [address], [phone], 4.2 mi from [facility], can come once a month. Please arrive and sign in no later than 11:15 so everyone can be served by 12 noon.  THIRD Wednesday  1:30pm-3:00pm, [address] [place], [address], [phone] or [phone], 2.1 mi from [facility]  Wednesday, by APPOINTMENT ONLY  [address] [facility], [address], [address], [phone], 5 mi from [facility], call one day ahead for appointment the next day between 10:00am and 12:00noon, can come once every 3 months, bring your photo ID and must qualify according to family income   [facility], [address] [address], [phone], 0.7 mi from [facility], call in advance for appointment, can come once every 30 days, bring your photo ID and SS cards for other residents of household   [address] of [place], [address] [place], [address], [phone], 3.2 mi from [facility], call in advance for appointment at 7:00pm, must provide valid photo ID  Thursday  9:00am-12:00noon [employer], [address], [address], [phone], 1.3 mi from [facility], can come once every 3 months, bring your photo ID and SS  numbers for other residents of household   9:00am-1:00pm [facility] [address],  [place],  [address], [phone]/Ext [phone], 1.6 mi from [facility], can come once every two weeks  9:30am-12:00noon [facility], [address], [address], [phone], 4.5 mi from [facility], can come once every 30 days, must state income [closed Thanksgiving and week of Christmas]  9:30am-5:00pm [employer], [address] [place] [phone], [phone], 0.9 mi from [facility], can come four times per year, bring your photo ID and SS card for other residents of household, will make appointments for those who work and need to come after 5pm  10:00am-1:00pm [employer], [address] [place], [address],  [phone], 3.9 mi from [facility], with referral from DSS, may come six times, 30 days apart, bring your photo ID and SS cards for all residents of household   10:00am-1:00pm [address] the [facility], [address] [place], [address], [phone], 7.7 mi from [facility], can come once every thirty days with a referral from DSS, [employer], Mental Health, etc.- each referral good for six visits, bring photo ID   10:00am-1:00pm [facility], [address], [address], [phone], 4.2 mi from [facility], can come once every 6 months, open to [facility] residents, bring photo ID and copy of a current utility bill in your name, please call first to verify that food is available   SUNDAYS BREAKFAST TWO LOCATIONS: 8:00am served in [facility] by Awaken [employer] 8:30am SHUTTLE provided from [facility], served at [employer], [address] LUNCH TWO LOCATIONS [plus one additional third Sunday only] 10:30am - 12:30pm served at [employer], [employer], [place] [address] (1.2 miles from [facility]) 12:30pm served in [place] by Land O'Lakes Team (THIRD Sunday only) 1:30pm served at [facility] by [facility] one  location [plus one additional third Sunday only] 5:00pm Every Sunday, served under the bridge at [address] by Lige Reeve Under the 3M Company (.7 miles from [facility]) (THIRD Sunday ONLY) 4:00pm served in the parking garage, across from [employer], corner of [place] and [place] by Ryland Group Works Ministries MONDAYS BREAKFAST 7:30am served in [employer] by the [employer] and Friends LUNCH 10:30am - 12:30pm served at [employer], [employer], [place] [address] (1.2 miles from [facility]) DINNER TWO LOCATIONS: 7:00pm served in front of the courthouse at the corner of Goldman Sachs and Phel[employer] Dodge. by [place] Monday Night Meal (3 blocks from [facility]) 4:30pm served at the [employer], [address] by Bank of New York Company (0.6 miles from [facility] ) TUESDAYS BREAKFAST 8:00am - 9:00am served at The TJX Companies, [address] (0.3 miles from [place]) LUNCH 10:30am - 12:30pm served at the [employer], [employer] [address], (1.2 miles from [place]) DINNER 6:00pm served at CSX Corporation, enter from Capital One and go to the Sonic Automotive, (0.7 miles from [place]) [facility] BREAKFAST 7:00am - 8:00am served at [employer], [employer] [address] (1.2 miles from [place]) LUNCH ONE LOCATION [plus two additional locations listed below] 10:30am - 12:30pm served at [employer], [employer] [address], (1.2 miles from [place]) (FIRST Wednesday ONLY) 11:30am served at Dillard's, [place] [address] (6.6 miles from [place]) (SECOND Wednesday ONLY) 11:00am served at [place]. [place] of [address], [address] (1.3 miles from [place]) [place] TWO LOCATIONS 6:00pm served at W. R. Berkley, [place] W. Visteon Corporation.  (1.3 miles from [facility]) 4:00pm - 6:00pm (hot dogs and chips) served at Levi Strauss of [facility], [address] (1.7 miles from [place]) [place] BREAKFAST NOT AVAILABLE AT THIS TIME LUNCH 10:30am - 12:30pm served at [employer], [employer], [place] [address], (1.2 miles from [place]) DINNER 6:00pm served at [employer], enter from Capital One and go to the S[employer]otive, (0.7 miles from N[employer] Corporation) [place] BREAKFAST NOT AVAILABLE AT THIS TIME LUNCH 10:30am - 12:30pm served at [employer], [employer] [address] (1.2 miles from [place]) DINNER TWO LOCATIONS, [plus one additional first Friday only] 6:00pm served under the bridge at [address] by Lige Reeve Under [employer]. (.7 miles from [facility]) 5:00pm - 7:00pm served at Levi Strauss of [facility], [address] (1.7 miles from [place]) (FIRST Friday ONLY) 5:45 pm - SHUTTLE provided from the LIBRARY at 5:45pm. Served at [facility], [address]. SATURDAYS BREAKFAST TWO LOCATIONS [plus one additional last Saturday only] 8:00am served at [facility] by Delphi 8:30am served at [employer], [address] (2.2 miles from [facility]) (LAST Saturday ONLY) 8:30am served at [employer], [address] (5 miles from [place]) LUNCH 10:30am - 12:30pm served at [employer], [employer] [address] (1.2 miles from [facility]) DINNER 6:00pm served under the bridge at [address] by World Fuel Services Corporation (0.7 miles from Nucor Corporation)  DIRECTIONS FROM [place] TO ALL MEAL LOCATIONS The Bridge at [address] (.7 miles from [address]) [address] on [place]. Turn Right onto DIRECTV 433  ft. Continue onto [address] under bridge, about 500 ft. Courthouse (3 blocks from [facility]) [place] on [address]. Turn right on [place]  1 block to PPL Corporation (.5 miles from Banks) [place] on [place]. YRC Worldwide. past Brink's Company to EMCOR. Enter from Capital One and go to the Affiliated Computer Services building W. R. Berkley [address] Visteon [address]. (1.3 miles from [facility]) [address] on [place]. Turn Right onto [address]. Mel[address]. church will be on the Left. The TJX Companies [address] (.3 miles from [facility]) Go .3 miles on W. [address] Destination is on your right Dillard's at ONEOK (6.6 miles from [address]) [address] on [place] toward W [address] Turn right onto [address] Continue onto Alcoa Inc. Continue onto Toll Brothers. 5. Latina Pol is on right Sanmina-SCI Futures trader) [address] (.6 miles from [place]) [place] on [place]. [address] Turn Left onto [address] 0.3 miles Destination is on the Left. [place] Black & Decker at American Express (5 miles from Nucor Corporation) 1. Head south on [address]. Turn right onto W  Friend[address] Turn slightly left onto Becton, Dickinson and Company onto Quest Diagnostics Turn right at Barnes & Noble Continue to church on right [place] Birth Sounds of [facility] [address] (1.7 miles from [place]) [address] on [place] 1.4 miles [place] becomes [place]. [address] Continue 0.6 miles and church will be on theright. [place] Guardian Life Insurance at [address] (2.5 miles from Nucor Corporation) [place] provided from Massachusetts Mutual Life Park] [place] on [place]. Elm toward Estée Lauder right onto Costco Wholesale left onto Emerson Electric Turn left onto Micron Technology [address] [place]  [address] (2.2 miles from [address]) [address] on [place] 1.4 miles [place] becomes [place]. Elm  [address] Turn right onto [address] [place]  [address] and church will be on the Left. [place] House/[place] AT&T [address] (1.2 miles from [facility]) 1.Turn right onto [facility] [place] 2.Turn left onto [place] 3.[place] 4.Destination is on your right [place]. [place] of [address] at ToysRus (1.3 miles from [facility]) [address] on [address] Turn left onto Genuine Parts right onto S. [place]. Continue onto KB Home	[place]. Turn left onto Smurfit-Stone Container. Turn right onto WellPoint.

## 2024-02-25 NOTE — Plan of Care (Signed)

## 2024-02-25 NOTE — Progress Notes (Signed)
 PROGRESS NOTE    Mark Hayden  FMW:[id number] DOB: 17-Oct-1975 DOA: 02/23/2024 PCP: [name] [name], MD   Brief Narrative: Mark Hayden is a 48 y.o. male with a history of hypertension, hyperlipidemia, depression, CKD stage IV.  Patient presented secondary to shortness of breath and cough, found to have evidence of severely elevated blood pressure secondary to medication nonadherence. Antihypertensives resumed.   Assessment and Plan:  Hypertensive urgency Secondary to medication nonadherence. Patient initially restarted on home regimen with significant improvement in blood pressure; hydralazine  held temporarily secondary to significant drop in blood pressure but is now restarted -Continue Imdur , doxazosin , amlodipine  and hydralazine  -Cardiology recommendations: pending  Elevated BNP Noted. Patient with shortness of breath, which improved. No obvious evidence of fluid overload. Patient was started on Lasix  IV on admission and discontinued.  Demand ischemia Elevated but flat trend on admission. Initially, patient did not have chest pain, but this developed during hospitalization. Heparin  IV was started on admission. Troponin remains flat and not consistent with ACS. Cardiology consulted on 12/8. -Continue heparin  IV pending cardiology recommendations -Follow-up Transthoracic Echocardiogram  Chronic HFpEF Patient with an elevated BNP, however no obvious evidence of acute heart failure. Patient was given Lasix  on admission, however. Last Transthoracic Echocardiogram from June 2025 significant for an LVEF of 60-65% with associated moderate to severe concentric LVH. -Blood pressure control  CKD stage IV Noted. Stable currently. Patient has not been established with nephrology. Nephrology consulted and recommend vascular consultation for consideration of fistula for eventual future hemodialysis. Good urine output. TOC consulted for transportation  requirements/needs.  Shortness of breath Resolved.  Nausea and vomiting Resolved.  Medication nonadherence Patient reports episodes of a panic attack when attempting to resume antihypertensives. Currently has no home and is considering to live with his sister. No reliable transportation complicates outpatient follow-up as well. -TOC consultation  Depression Anxiety -Continue Zoloft   OSA -Continue CPAP at bedtime   DVT prophylaxis: Heparin  IV Code Status:   Code Status: Full Code Family Communication: None at bedside Disposition Plan: Discharge pending ongoing specialist recommendations and improvement/stability of blood pressure   Consultants:  Cardiology Nephrology  Procedures:  None  Antimicrobials: None    Subjective: No issues this morning.  Objective: BP (!) 179/108   Pulse 93   Temp 97.9 F (36.6 C) (Oral)   Resp 19   Ht 5' 9 (1.753 m)   Wt 84.9 kg   SpO2 100%   BMI 27.64 kg/m   Examination:  General exam: Appears calm and comfortable. Respiratory system: Clear to auscultation. Respiratory effort normal. Cardiovascular system: S1 & S2 heard, RRR. Gastrointestinal system: Abdomen is nondistended, soft and nontender. Normal bowel sounds heard. Central nervous system: Alert and oriented. No focal neurological deficits. Psychiatry: Judgement and insight appear normal. Mood & affect appropriate.    Data Reviewed: I have personally reviewed following labs and imaging studies  CBC Lab Results  Component Value Date   WBC 5.6 02/25/2024   RBC 2.57 (L) 02/25/2024   HGB 7.5 (L) 02/25/2024   HCT 23.4 (L) 02/25/2024   MCV 91.1 02/25/2024   MCH 29.2 02/25/2024   PLT 143 (L) 02/25/2024   MCHC 32.1 02/25/2024   RDW 16.6 (H) 02/25/2024   LYMPHSABS 0.7 02/23/2024   MONOABS 0.3 02/23/2024   EOSABS 0.2 02/23/2024   BASOSABS 0.0 02/23/2024     Last metabolic panel Lab Results  Component Value Date   NA 139 02/25/2024   K 3.4 (L) 02/25/2024    CL 111 02/25/2024  CO2 16 (L) 02/25/2024   BUN 35 (H) 02/25/2024   CREATININE 4.59 (H) 02/25/2024   GLUCOSE 99 02/25/2024   GFRNONAA 15 (L) 02/25/2024   GFRAA >60 09/09/2019   CALCIUM  6.5 (L) 02/25/2024   PHOS 3.2 08/20/2022   PROT 6.0 (L) 01/24/2024   ALBUMIN 3.3 (L) 01/24/2024   BILITOT 0.5 01/24/2024   ALKPHOS 85 01/24/2024   AST 21 01/24/2024   ALT 27 01/24/2024   ANIONGAP 12 02/25/2024    GFR: Estimated Creatinine Clearance: 21.5 mL/min (A) (by C-G formula based on SCr of 4.59 mg/dL (H)).  Recent Results (from the past 240 hours)  Resp panel by RT-PCR (RSV, Flu A&B, Covid) Anterior Nasal Swab     Status: None   Collection Time: 02/23/24  6:39 AM   Specimen: Anterior Nasal Swab  Result Value Ref Range Status   SARS Coronavirus 2 by RT PCR NEGATIVE NEGATIVE Final    Comment: (NOTE) SARS-CoV-2 target nucleic acids are NOT DETECTED.  The SARS-CoV-2 RNA is generally detectable in upper respiratory specimens during the acute phase of infection. The lowest concentration of SARS-CoV-2 viral copies this assay can detect is 138 copies/mL. A negative result does not preclude SARS-Cov-2 infection and should not be used as the sole basis for treatment or other patient management decisions. A negative result may occur with  improper specimen collection/handling, submission of specimen other than nasopharyngeal swab, presence of viral mutation(s) within the areas targeted by this assay, and inadequate number of viral copies(<138 copies/mL). A negative result must be combined with clinical observations, patient history, and epidemiological information. The expected result is Negative.  Fact Sheet for Patients:  bloggercourse.com  Fact Sheet for Healthcare Providers:  seriousbroker.it  This test is no t yet approved or cleared by the [place]  FDA and  has been authorized for detection and/or diagnosis of SARS-CoV-2 by FDA  under an Emergency Use Authorization (EUA). This EUA will remain  in effect (meaning this test can be used) for the duration of the COVID-19 declaration under Section 564(b)(1) of the Act, 21 U.S.C.section 360bbb-3(b)(1), unless the authorization is terminated  or revoked sooner.       Influenza A by PCR NEGATIVE NEGATIVE Final   Influenza B by PCR NEGATIVE NEGATIVE Final    Comment: (NOTE) The Xpert Xpress SARS-CoV-2/FLU/RSV plus assay is intended as an aid in the diagnosis of influenza from Nasopharyngeal swab specimens and should not be used as a sole basis for treatment. Nasal washings and aspirates are unacceptable for Xpert Xpress SARS-CoV-2/FLU/RSV testing.  Fact Sheet for Patients: bloggercourse.com  Fact Sheet for Healthcare Providers: seriousbroker.it  This test is not yet approved or cleared by the [place]  FDA and has been authorized for detection and/or diagnosis of SARS-CoV-2 by FDA under an Emergency Use Authorization (EUA). This EUA will remain in effect (meaning this test can be used) for the duration of the COVID-19 declaration under Section 564(b)(1) of the Act, 21 U.S.C. section 360bbb-3(b)(1), unless the authorization is terminated or revoked.     Resp Syncytial Virus by PCR NEGATIVE NEGATIVE Final    Comment: (NOTE) Fact Sheet for Patients: bloggercourse.com  Fact Sheet for Healthcare Providers: seriousbroker.it  This test is not yet approved or cleared by the [place]  FDA and has been authorized for detection and/or diagnosis of SARS-CoV-2 by FDA under an Emergency Use Authorization (EUA). This EUA will remain in effect (meaning this test can be used) for the duration of the COVID-19 declaration under Section 564(b)(1) of  the Act, 21 U.S.C. section 360bbb-3(b)(1), unless the authorization is terminated or revoked.  Performed at [facility], [address] [place], [place] [address]   MRSA Next Gen by PCR, Nasal     Status: None   Collection Time: 02/23/24  3:31 PM   Specimen: Nasal Mucosa; Nasal Swab  Result Value Ref Range Status   MRSA by PCR Next Gen NOT DETECTED NOT DETECTED Final    Comment: (NOTE) The GeneXpert MRSA Assay (FDA approved for NASAL specimens only), is one component of a comprehensive MRSA colonization surveillance program. It is not intended to diagnose MRSA infection nor to guide or monitor treatment for MRSA infections. Test performance is not FDA approved in patients less than 21 years old. Performed at [facility], [address] [place], [place] [address]       Radiology Studies: DG CHEST PORT 1 VIEW Result Date: 02/24/2024 CLINICAL DATA:  Chest pain. EXAM: PORTABLE CHEST 1 VIEW COMPARISON:  02/23/2024 and CT chest 10/28/2022. FINDINGS: Trachea is midline. Heart is enlarged. Mild hazy opacification in right hemithorax. Streaky densities in the lung bases. Small bilateral pleural effusions. IMPRESSION: Mild congestive heart failure. Electronically Signed   By: Newell Eke M.D.   On: 02/24/2024 12:57      LOS: 2 days    Elgin Lam, MD [facility] 02/25/2024, 8:13 AM   If 7PM-7AM, please contact night-coverage [email or web address]

## 2024-02-25 NOTE — Progress Notes (Signed)
 Patient with hypertension overnight, ranging from 160-180s/90-110s. Trending back, blood pressure seemed to run high throughout the day on 02/24/2024 as well. Two pages to Dr. Franky throughout the shift. See new orders for details.

## 2024-02-25 NOTE — Progress Notes (Signed)
 Rounding Note   Patient Name: Mark Hayden Date of Encounter: 02/25/2024  [facility] Cardiologist: None   Subjective BP 168/97.  Creatinine stable at 4.59.  Denies any chest pain or dyspnea  Scheduled Meds:  amLODipine   10 mg Oral Daily   aspirin  EC  81 mg Oral Daily   atorvastatin   20 mg Oral QHS   doxazosin   4 mg Oral Daily   feeding supplement  237 mL Oral BID BM   hydrALAZINE   100 mg Oral TID   isosorbide  mononitrate  120 mg Oral Daily   sertraline   50 mg Oral Daily   Continuous Infusions:  heparin  1,500 Units/hr (02/25/24 0012)   PRN Meds: acetaminophen  **OR** acetaminophen , guaiFENesin , HYDROmorphone  (DILAUDID ) injection, ipratropium-albuterol , nitroGLYCERIN , ondansetron  **OR** ondansetron  (ZOFRAN ) IV   Vital Signs  Vitals:   02/24/24 2300 02/25/24 0400 02/25/24 0416 02/25/24 0730  BP: (!) 172/90 (!) 183/110 (!) 179/108 (!) 168/97  Pulse: 92 87 93 (!) 101  Resp: (!) 24   19  Temp:    97.9 F (36.6 C)  TempSrc:    Oral  SpO2: 97% 99% 100% 99%  Weight:      Height:        Intake/Output Summary (Last 24 hours) at 02/25/2024 0930 Last data filed at 02/24/2024 1900 Gross per 24 hour  Intake 240 ml  Output 500 ml  Net -260 ml      02/23/2024    2:44 PM 02/23/2024   12:14 PM 02/23/2024    9:53 AM  Last 3 Weights  Weight (lbs) 187 lb 2.7 oz 190 lb 14.7 oz 190 lb 14.7 oz  Weight (kg) 84.9 kg 86.6 kg 86.6 kg      Telemetry Normal sinus rhythm- Personally Reviewed  ECG  No new ECG- Personally Reviewed  Physical Exam  GEN: No acute distress.   Neck: + JVD Cardiac: RRR, no murmurs, rubs, or gallops.  Respiratory: Clear to auscultation bilaterally. GI: Soft, nontender, non-distended  MS: No edema; No deformity. Neuro:  Nonfocal  Psych: Normal affect   Labs High Sensitivity Troponin:   Recent Labs  Lab 02/24/24 1152 02/24/24 1435  TROPONINIHS 106* 104*     Chemistry Recent Labs  Lab 02/23/24 0639 02/24/24 1006  02/25/24 0150  NA 141 139 139  K 3.7 3.3* 3.4*  CL 108 111 111  CO2 18* 21* 16*  GLUCOSE 115* 138* 99  BUN 40* 39* 35*  CREATININE 4.71* 4.71* 4.59*  CALCIUM  7.0* 6.6* 6.5*  MG  --   --  1.8  GFRNONAA 15* 15* 15*  ANIONGAP 15 7 12     Lipids No results for input(s): CHOL, TRIG, HDL, LABVLDL, LDLCALC, CHOLHDL in the last 168 hours.  Hematology Recent Labs  Lab 02/23/24 0639 02/24/24 1006 02/25/24 0150  WBC 6.4 6.5 5.6  RBC 3.20* 2.92* 2.57*  HGB 9.2* 8.4* 7.5*  HCT 28.6* 26.3* 23.4*  MCV 89.4 90.1 91.1  MCH 28.8 28.8 29.2  MCHC 32.2 31.9 32.1  RDW 16.4* 16.9* 16.6*  PLT 187 189 143*   Thyroid No results for input(s): TSH, FREET4 in the last 168 hours.  BNP Recent Labs  Lab 02/23/24 0639 02/24/24 1006  BNP  --  901.7*  PROBNP 25,589.0*  --     DDimer No results for input(s): DDIMER in the last 168 hours.   Radiology  DG CHEST PORT 1 VIEW Result Date: 02/24/2024 CLINICAL DATA:  Chest pain. EXAM: PORTABLE CHEST 1 VIEW COMPARISON:  02/23/2024 and CT chest  10/28/2022. FINDINGS: Trachea is midline. Heart is enlarged. Mild hazy opacification in right hemithorax. Streaky densities in the lung bases. Small bilateral pleural effusions. IMPRESSION: Mild congestive heart failure. Electronically Signed   By: Newell Eke M.D.   On: 02/24/2024 12:57    Cardiac Studies   Patient Profile   48 y.o. male with a hx of uncontrolled hypertension, chronic HFpEF, CKD, who is being seen 02/24/2024 for the evaluation of elevated troponins and chest pain and CHF   Assessment & Plan  Chest pain with elevated troponins  Presentation seems overall noncardiac and attributed to panic attacks in the setting of medication inducing anxiety.  Has nonexertional symptoms, describes pain as sharp stabbing.  EKG with no acute ST-T wave changes and stable on serial repeats. Troponins [phone] which would argue against ACS.  Not a surprising finding given underlying CKD with  uncontrolled hypertension with BP 230/120 on arrival. Will f/u echocardiogram, if no WMA can stop heparin  gtt  Chronic HFpEF Often accompanied with his uncontrolled hypertension presentation.  Fortunately here he appears to be euvolemic, only with isolated JVD and no significant symptoms now.  EF 60 to 65% 08/2023 with moderate to severe concentric LVH likely related to chronic uncontrolled hypertension.  Normal RV.  Moderately dilated LA.  Mild MR. Mildy hypervolemic on exam, would defer diuresis to nephrology given his renal function   Uncontrolled hypertension Long hx of this with recurrent hospitalizations in the setting of noncompliance and dietary indiscretions.  Documented since 2023. He reports he is noncompliant with this given medications give him severe anxiety/panic attacks.  BP improved today. Would consider psychiatric referral. Continue with amlodipine  10 mg daily, doxazosin  4 mg daily.  Would continue hydralazine  100 mg 3 Continue Imdur  120 mg daily. Of note he reported GI symptoms on carvedilol .   CKD stage V Creatinine stable at 4.59 today, nephrology following  OSA Questionable compliancy on CPAP, likely contributing.   For questions or updates, please contact [facility] Please consult [email or web address] for contact info under     Signed, Lonni LITTIE Nanas, MD  02/25/2024, 9:30 AM

## 2024-02-26 ENCOUNTER — Other Ambulatory Visit

## 2024-02-26 ENCOUNTER — Encounter (HOSPITAL_COMMUNITY): Payer: Self-pay | Admitting: Internal Medicine

## 2024-02-26 ENCOUNTER — Other Ambulatory Visit: Payer: Self-pay

## 2024-02-26 ENCOUNTER — Inpatient Hospital Stay (HOSPITAL_COMMUNITY): Payer: Self-pay | Admitting: Anesthesiology

## 2024-02-26 ENCOUNTER — Encounter (HOSPITAL_COMMUNITY): Admission: EM | Disposition: A | Payer: Self-pay | Source: Home / Self Care | Attending: Family Medicine

## 2024-02-26 ENCOUNTER — Inpatient Hospital Stay (HOSPITAL_COMMUNITY)

## 2024-02-26 DIAGNOSIS — N184 Chronic kidney disease, stage 4 (severe): Secondary | ICD-10-CM

## 2024-02-26 HISTORY — PX: INSERTION OF ARTERIOVENOUS (AV) ARTEGRAFT ARM: SHX6779

## 2024-02-26 LAB — CBC
HCT: 24.3 % — ABNORMAL LOW (ref 39.0–52.0)
Hemoglobin: 7.6 g/dL — ABNORMAL LOW (ref 13.0–17.0)
MCH: 28.6 pg (ref 26.0–34.0)
MCHC: 31.3 g/dL (ref 30.0–36.0)
MCV: 91.4 fL (ref 80.0–100.0)
Platelets: 145 K/uL — ABNORMAL LOW (ref 150–400)
RBC: 2.66 MIL/uL — ABNORMAL LOW (ref 4.22–5.81)
RDW: 17.2 % — ABNORMAL HIGH (ref 11.5–15.5)
WBC: 5.9 K/uL (ref 4.0–10.5)
nRBC: 0 % (ref 0.0–0.2)

## 2024-02-26 LAB — BASIC METABOLIC PANEL WITH GFR
Anion gap: 6 (ref 5–15)
BUN: 37 mg/dL — ABNORMAL HIGH (ref 6–20)
CO2: 22 mmol/L (ref 22–32)
Calcium: 7.3 mg/dL — ABNORMAL LOW (ref 8.9–10.3)
Chloride: 109 mmol/L (ref 98–111)
Creatinine, Ser: 4.6 mg/dL — ABNORMAL HIGH (ref 0.61–1.24)
GFR, Estimated: 15 mL/min — ABNORMAL LOW (ref >60)
Glucose, Bld: 103 mg/dL — ABNORMAL HIGH (ref 70–99)
Potassium: 4.1 mmol/L (ref 3.5–5.1)
Sodium: 137 mmol/L (ref 135–145)

## 2024-02-26 LAB — TYPE AND SCREEN
ABO/RH(D): O POS
Antibody Screen: NEGATIVE

## 2024-02-26 LAB — ABO/RH: ABO/RH(D): O POS

## 2024-02-26 SURGERY — INSERTION OF ARTERIOVENOUS (AV) ARTEGRAFT ARM
Anesthesia: Regional | Site: Arm Upper | Laterality: Left

## 2024-02-26 MED ORDER — ONDANSETRON HCL 4 MG/2ML IJ SOLN: 4.0000 mg | Freq: Once | INTRAMUSCULAR | Status: DC | PRN

## 2024-02-26 MED ORDER — SURGIFLO WITH THROMBIN (HEMOSTATIC MATRIX KIT) OPTIME
TOPICAL | Status: DC | PRN
  Administered 2024-02-26: 1 via TOPICAL

## 2024-02-26 MED ORDER — FENTANYL CITRATE (PF) 100 MCG/2ML IJ SOLN: 25.0000 ug | INTRAMUSCULAR | Status: DC | PRN

## 2024-02-26 MED ORDER — 0.9 % SODIUM CHLORIDE (POUR BTL) OPTIME
TOPICAL | Status: DC | PRN
  Administered 2024-02-26: 1000 mL

## 2024-02-26 MED ORDER — CHLORHEXIDINE GLUCONATE 0.12 % MT SOLN
OROMUCOSAL | Status: AC
  Administered 2024-02-26: 15 mL via OROMUCOSAL
  Filled 2024-02-26: qty 15

## 2024-02-26 MED ORDER — ONDANSETRON HCL 4 MG/2ML IJ SOLN
INTRAMUSCULAR | Status: DC | PRN
  Administered 2024-02-26: 4 mg via INTRAVENOUS

## 2024-02-26 MED ORDER — LABETALOL HCL 5 MG/ML IV SOLN
INTRAVENOUS | Status: DC | PRN
  Administered 2024-02-26: 2.5 mg via INTRAVENOUS

## 2024-02-26 MED ORDER — ACETAMINOPHEN 500 MG PO TABS
1000.0000 mg | ORAL_TABLET | Freq: Once | ORAL | Status: AC
  Administered 2024-02-26: 1000 mg via ORAL
  Filled 2024-02-26: qty 2

## 2024-02-26 MED ORDER — HEPARIN 6000 UNIT IRRIGATION SOLUTION
Status: AC
  Filled 2024-02-26: qty 500

## 2024-02-26 MED ORDER — FENTANYL CITRATE (PF) 100 MCG/2ML IJ SOLN
INTRAMUSCULAR | Status: DC | PRN
  Administered 2024-02-26 (×2): 50 ug via INTRAVENOUS

## 2024-02-26 MED ORDER — HEPARIN 6000 UNIT IRRIGATION SOLUTION
Status: DC | PRN
  Administered 2024-02-26: 1

## 2024-02-26 MED ORDER — FENTANYL CITRATE (PF) 100 MCG/2ML IJ SOLN
INTRAMUSCULAR | Status: AC
  Filled 2024-02-26: qty 2

## 2024-02-26 MED ORDER — ROPIVACAINE HCL 7.5 MG/ML IJ SOLN
INTRAMUSCULAR | Status: DC | PRN
  Administered 2024-02-26: 20 mL via PERINEURAL

## 2024-02-26 MED ORDER — PROPOFOL 500 MG/50ML IV EMUL
INTRAVENOUS | Status: DC | PRN
  Administered 2024-02-26: 50 ug/kg/min via INTRAVENOUS

## 2024-02-26 MED ORDER — MIDAZOLAM HCL (PF) 2 MG/2ML IJ SOLN
INTRAMUSCULAR | Status: DC | PRN
  Administered 2024-02-26: 2 mg via INTRAVENOUS

## 2024-02-26 MED ORDER — CEFAZOLIN SODIUM-DEXTROSE 2-3 GM-%(50ML) IV SOLR
INTRAVENOUS | Status: DC | PRN
  Administered 2024-02-26: 2 g via INTRAVENOUS

## 2024-02-26 MED ORDER — LORAZEPAM 2 MG/ML IJ SOLN
0.5000 mg | Freq: Once | INTRAMUSCULAR | Status: AC
  Administered 2024-02-26: 0.5 mg via INTRAVENOUS
  Filled 2024-02-26: qty 1

## 2024-02-26 MED ORDER — CEFAZOLIN SODIUM-DEXTROSE 2-4 GM/100ML-% IV SOLN
INTRAVENOUS | Status: AC
  Filled 2024-02-26: qty 100

## 2024-02-26 MED ORDER — PROPOFOL 10 MG/ML IV BOLUS
INTRAVENOUS | Status: DC | PRN
  Administered 2024-02-26: 20 mg via INTRAVENOUS

## 2024-02-26 MED ORDER — SODIUM CHLORIDE 0.9 % IV SOLN: INTRAVENOUS | Status: DC

## 2024-02-26 MED ORDER — ORAL CARE MOUTH RINSE: 15.0000 mL | Freq: Once | OROMUCOSAL | Status: AC

## 2024-02-26 MED ORDER — MIDAZOLAM HCL 2 MG/2ML IJ SOLN
INTRAMUSCULAR | Status: AC
  Filled 2024-02-26: qty 2

## 2024-02-26 MED ORDER — CHLORHEXIDINE GLUCONATE 0.12 % MT SOLN: 15.0000 mL | Freq: Once | OROMUCOSAL | Status: AC

## 2024-02-26 SURGICAL SUPPLY — 27 items
ARMBAND PINK RESTRICT EXTREMIT (MISCELLANEOUS) ×2 IMPLANT
BAG COUNTER SPONGE SURGICOUNT (BAG) ×1 IMPLANT
CANISTER SUCTION 3000ML PPV (SUCTIONS) ×1 IMPLANT
CLIP TI MEDIUM 6 (CLIP) ×1 IMPLANT
CLIP TI WIDE RED SMALL 6 (CLIP) ×1 IMPLANT
COVER PROBE W GEL 5X96 (DRAPES) IMPLANT
DERMABOND ADVANCED .7 DNX12 (GAUZE/BANDAGES/DRESSINGS) ×1 IMPLANT
ELECTRODE REM PT RTRN 9FT ADLT (ELECTROSURGICAL) ×1 IMPLANT
GLOVE BIOGEL PI IND STRL 8 (GLOVE) ×1 IMPLANT
GLOVE SURG SS PI 7.5 STRL IVOR (GLOVE) ×1 IMPLANT
GOWN STRL REUS W/ TWL LRG LVL3 (GOWN DISPOSABLE) ×2 IMPLANT
GOWN STRL REUS W/ TWL XL LVL3 (GOWN DISPOSABLE) ×1 IMPLANT
HEMOSTAT SNOW SURGICEL 2X4 (HEMOSTASIS) IMPLANT
KIT BASIN OR (CUSTOM PROCEDURE TRAY) ×1 IMPLANT
KIT TURNOVER KIT B (KITS) ×1 IMPLANT
PACK CV ACCESS (CUSTOM PROCEDURE TRAY) ×1 IMPLANT
PAD ARMBOARD POSITIONER FOAM (MISCELLANEOUS) ×2 IMPLANT
SLING ARM FOAM STRAP LRG (SOFTGOODS) IMPLANT
SOLN 0.9% NACL POUR BTL 1000ML (IV SOLUTION) ×1 IMPLANT
SOLN STERILE WATER BTL 1000 ML (IV SOLUTION) ×1 IMPLANT
SURGIFLO W/THROMBIN 8M KIT (HEMOSTASIS) IMPLANT
SUT PROLENE 6 0 BV (SUTURE) ×2 IMPLANT
SUT PROLENE 7 0 BV 1 (SUTURE) IMPLANT
SUT VIC AB 3-0 SH 27X BRD (SUTURE) ×2 IMPLANT
SUT VIC AB 4-0 PS2 18 (SUTURE) IMPLANT
TOWEL GREEN STERILE (TOWEL DISPOSABLE) ×1 IMPLANT
UNDERPAD 30X36 HEAVY ABSORB (UNDERPADS AND DIAPERS) ×1 IMPLANT

## 2024-02-26 NOTE — H&P (Signed)
° ° °  Subjective  -  No complaints   Physical Exam:  Palpable left radial pulse       Assessment/Plan:    CKD:  discussed proceeding with left arm acces, either fistla or AVGG.  Discussed need for future interventions and risk of failure and steal.  All questions answered  Mark Hayden 02/26/2024 8:17 AM --  Vitals:   02/26/24 0300 02/26/24 0730  BP: (!) 168/94 (!) 170/89  Pulse: 93 96  Resp: 14 17  Temp: 98.4 F (36.9 C) 98.7 F (37.1 C)  SpO2: 98% 99%    Intake/Output Summary (Last 24 hours) at 02/26/2024 0817 Last data filed at 02/26/2024 0700 Gross per 24 hour  Intake 84.83 ml  Output 1425 ml  Net -1340.17 ml     Laboratory CBC    Component Value Date/Time   WBC 5.9 02/26/2024 0517   HGB 7.6 (L) 02/26/2024 0517   HCT 24.3 (L) 02/26/2024 0517   PLT 145 (L) 02/26/2024 0517    BMET    Component Value Date/Time   NA 137 02/26/2024 0517   NA 141 01/21/2023 1209   K 4.1 02/26/2024 0517   CL 109 02/26/2024 0517   CO2 22 02/26/2024 0517   GLUCOSE 103 (H) 02/26/2024 0517   BUN 37 (H) 02/26/2024 0517   BUN 61 (H) 01/21/2023 1209   CREATININE 4.60 (H) 02/26/2024 0517   CALCIUM  7.3 (L) 02/26/2024 0517   GFRNONAA 15 (L) 02/26/2024 0517   GFRAA >60 09/09/2019 0624    COAG Lab Results  Component Value Date   INR 1.1 01/08/2023   No results found for: PTT  Antibiotics Anti-infectives (From admission, onward)    Start     Dose/Rate Route Frequency Ordered Stop   02/26/24 0807  ceFAZolin  (ANCEF ) 2-4 GM/100ML-% IVPB       Note to Pharmacy: [name]: cabinet override      02/26/24 0807 02/26/24 2014        V. Malvina Serene CLORE, M.D., [facility] Office: [phone] Pager:  [phone]

## 2024-02-26 NOTE — Progress Notes (Signed)
 PROGRESS NOTE    Mark Hayden  FMW:[id number] DOB: 11-Jul-1975 DOA: 02/23/2024 PCP: Mark [name], MD  Chief Complaint  Patient presents with   Shortness of Breath    Brief Narrative:   Mark Hayden is Mark Hayden 48 y.o. male with Mark Hayden history of hypertension, hyperlipidemia, depression, CKD stage IV. Patient presented secondary to shortness of breath and cough, found to have evidence of severely elevated blood pressure secondary to medication nonadherence. Antihypertensives resumed.   Approaching readiness for discharge.  Discharge complicated by homelessness.   Assessment & Plan:   Principal Problem:   Hypertensive urgency Active Problems:   Elevated troponin   Elevated brain natriuretic peptide (BNP) level   Shortness of breath   Nausea and vomiting   CKD (chronic kidney disease) stage 4, GFR 15-29 ml/min (HCC)   Hypertension   Chronic heart failure with preserved ejection fraction (HFpEF) (HCC)   OSA (obstructive sleep apnea)   Depression   Noncompliance with medications  Hypertensive urgency Secondary to medication nonadherence. Patient initially restarted on home regimen with significant improvement in blood pressure; hydralazine  held temporarily secondary to significant drop in blood pressure but is now restarted -Continue Imdur , doxazosin , amlodipine  and hydralazine  -appreciate cards recs   Demand ischemia Elevated Troponins Troponins high and flat.  Suspected demand ischemia in setting of hypertensive urgency -Continue heparin  IV pending cardiology recommendations -Follow-up Transthoracic Echocardiogram - no RWMA   Chronic HFpEF Moderately Elevated Pulmonary Artery Systolic Pressure Echo with severe concentric LV hypertrophy - grade II diastolic dysfunction.  Interventricular septum flattened in systole, c/w RV pressure overload.  LV wall with speckled pattern, would benefit from cardiac MR to rule out infiltrative disease. Torsemide  Appreciate  cardiology assistance  CKD stage IV Noted. Stable currently. S/p vascular access today (op note pending). Appreciate renal assistance - torsemide , bicarb, calcitriol , tums   Shortness of breath Resolved.   Nausea and vomiting Resolved.   Medication nonadherence Patient reports episodes of Mark Hayden panic attack when attempting to resume antihypertensives. Currently has no home and is considering living with his sister. No reliable transportation complicates outpatient follow-up as well. -TOC consultation   Depression Anxiety -Continue Zoloft    OSA -Continue CPAP at bedtime  Homelessness TOC for resources    DVT prophylaxis: SCD Code Status: full Family Communication: none Disposition:   Status is: Inpatient Remains inpatient appropriate because: need for continued inpatient care   Consultants:  Cardiology Vascular Renal   Procedures:  Echo IMPRESSIONS     1. Left ventricular wall with speckled pattern, would benefit from  cardiac MR to rule out infiltrative disease . Left ventricular ejection  fraction, by estimation, is 55 to 60%. The left ventricle has normal  function. The left ventricle has no regional  wall motion abnormalities. There is severe concentric left ventricular  hypertrophy. Left ventricular diastolic parameters are consistent with  Grade II diastolic dysfunction (pseudonormalization). Elevated left  ventricular end-diastolic pressure. There is   the interventricular septum is flattened in systole, consistent with  right ventricular pressure overload.   2. Right ventricular systolic function is low normal. The right  ventricular size is mildly enlarged. There is moderately elevated  pulmonary artery systolic pressure.   3. Left atrial size was moderately dilated.   4. Right atrial size was moderately dilated.   5. Mark Hayden small pericardial effusion is present. The pericardial effusion is  anterior to the right ventricle.   6. Mitral valve apparatus  appears to have Mark Hayden thickened mass at the base.  This was present  in prior echo doe 08/2023 and 12/2022 - suspect this is  due to thickened papillary muscle. The mitral valve is normal in  structure. Mild mitral valve regurgitation.  No evidence of mitral stenosis.   7. The tricuspid valve is abnormal. Tricuspid valve regurgitation is  moderate.   8. The aortic valve is normal in structure. Aortic valve regurgitation is  not visualized. Aortic valve sclerosis is present, with no evidence of  aortic valve stenosis.   9. There is borderline dilatation of the aortic root, measuring 36 mm.    Antimicrobials:  Anti-infectives (From admission, onward)    Start     Dose/Rate Route Frequency Ordered Stop   02/26/24 0807  ceFAZolin  (ANCEF ) 2-4 GM/100ML-% IVPB       Note to Pharmacy: [name]: cabinet override      02/26/24 0807 02/26/24 2014       Subjective: No complaints today  Objective: Vitals:   02/26/24 1000 02/26/24 1015 02/26/24 1035 02/26/24 1200  BP: (!) 137/93 (!) 134/91 (!) 138/91 (!) 150/96  Pulse: 78 78 77 79  Resp: 19 20 19    Temp:  97.8 F (36.6 C) 97.8 F (36.6 C) 97.7 F (36.5 C)  TempSrc:   Oral Oral  SpO2: 98% 94% 95% 97%  Weight:      Height:        Intake/Output Summary (Last 24 hours) at 02/26/2024 1429 Last data filed at 02/26/2024 0945 Gross per 24 hour  Intake 300 ml  Output 1445 ml  Net -1145 ml   Filed Weights   02/23/24 0953 02/23/24 1214 02/23/24 1444  Weight: 86.6 kg 86.6 kg 84.9 kg    Examination:  General exam: Appears calm and comfortable  Respiratory system: unlabored Cardiovascular system: RRR Gastrointestinal system: Abdomen is nondistended, soft and nontender.  Central nervous system: Alert and oriented. No focal neurological deficits. Extremities: no LEE - LUE in sling   Data Reviewed: I have personally reviewed following labs and imaging studies  CBC: Recent Labs  Lab 02/23/24 0639 02/24/24 1006 02/25/24 0150  02/26/24 0517  WBC 6.4 6.5 5.6 5.9  NEUTROABS 5.0  --   --   --   HGB 9.2* 8.4* 7.5* 7.6*  HCT 28.6* 26.3* 23.4* 24.3*  MCV 89.4 90.1 91.1 91.4  PLT 187 189 143* 145*    Basic Metabolic Panel: Recent Labs  Lab 02/23/24 0639 02/24/24 1006 02/25/24 0150 02/26/24 0517  NA 141 139 139 137  K 3.7 3.3* 3.4* 4.1  CL 108 111 111 109  CO2 18* 21* 16* 22  GLUCOSE 115* 138* 99 103*  BUN 40* 39* 35* 37*  CREATININE 4.71* 4.71* 4.59* 4.60*  CALCIUM  7.0* 6.6* 6.5* 7.3*  MG  --   --  1.8  --     GFR: Estimated Creatinine Clearance: 21.5 mL/min (Ellizabeth Dacruz) (by C-G formula based on SCr of 4.6 mg/dL (H)).  Liver Function Tests: No results for input(s): AST, ALT, ALKPHOS, BILITOT, PROT, ALBUMIN in the last 168 hours.  CBG: No results for input(s): GLUCAP in the last 168 hours.   Recent Results (from the past 240 hours)  Resp panel by RT-PCR (RSV, Flu Selicia Windom&B, Covid) Anterior Nasal Swab     Status: None   Collection Time: 02/23/24  6:39 AM   Specimen: Anterior Nasal Swab  Result Value Ref Range Status   SARS Coronavirus 2 by RT PCR NEGATIVE NEGATIVE Final    Comment: (NOTE) SARS-CoV-2 target nucleic acids are NOT DETECTED.  The SARS-CoV-2  RNA is generally detectable in upper respiratory specimens during the acute phase of infection. The lowest concentration of SARS-CoV-2 viral copies this assay can detect is 138 copies/mL. Ameerah Huffstetler negative result does not preclude SARS-Cov-2 infection and should not be used as the sole basis for treatment or other patient management decisions. Harman Langhans negative result may occur with  improper specimen collection/handling, submission of specimen other than nasopharyngeal swab, presence of viral mutation(s) within the areas targeted by this assay, and inadequate number of viral copies(<138 copies/mL). Kshawn Canal negative result must be combined with clinical observations, patient history, and epidemiological information. The expected result is Negative.  Fact Sheet  for Patients:  bloggercourse.com  Fact Sheet for Healthcare Providers:  seriousbroker.it  This test is no t yet approved or cleared by the [place]  FDA and  has been authorized for detection and/or diagnosis of SARS-CoV-2 by FDA under an Emergency Use Authorization (EUA). This EUA will remain  in effect (meaning this test can be used) for the duration of the COVID-19 declaration under Section 564(b)(1) of the Act, 21 U.S.C.section 360bbb-3(b)(1), unless the authorization is terminated  or revoked sooner.       Influenza Chloe Flis by PCR NEGATIVE NEGATIVE Final   Influenza B by PCR NEGATIVE NEGATIVE Final    Comment: (NOTE) The Xpert Xpress SARS-CoV-2/FLU/RSV plus assay is intended as an aid in the diagnosis of influenza from Nasopharyngeal swab specimens and should not be used as Leanndra Pember sole basis for treatment. Nasal washings and aspirates are unacceptable for Xpert Xpress SARS-CoV-2/FLU/RSV testing.  Fact Sheet for Patients: bloggercourse.com  Fact Sheet for Healthcare Providers: seriousbroker.it  This test is not yet approved or cleared by the [place]  FDA and has been authorized for detection and/or diagnosis of SARS-CoV-2 by FDA under an Emergency Use Authorization (EUA). This EUA will remain in effect (meaning this test can be used) for the duration of the COVID-19 declaration under Section 564(b)(1) of the Act, 21 U.S.C. section 360bbb-3(b)(1), unless the authorization is terminated or revoked.     Resp Syncytial Virus by PCR NEGATIVE NEGATIVE Final    Comment: (NOTE) Fact Sheet for Patients: bloggercourse.com  Fact Sheet for Healthcare Providers: seriousbroker.it  This test is not yet approved or cleared by the [place]  FDA and has been authorized for detection and/or diagnosis of SARS-CoV-2 by FDA under an  Emergency Use Authorization (EUA). This EUA will remain in effect (meaning this test can be used) for the duration of the COVID-19 declaration under Section 564(b)(1) of the Act, 21 U.S.C. section 360bbb-3(b)(1), unless the authorization is terminated or revoked.  Performed at [facility], [address], [place], [place] [address]   MRSA Next Gen by PCR, Nasal     Status: None   Collection Time: 02/23/24  3:31 PM   Specimen: Nasal Mucosa; Nasal Swab  Result Value Ref Range Status   MRSA by PCR Next Gen NOT DETECTED NOT DETECTED Final    Comment: (NOTE) The GeneXpert MRSA Assay (FDA approved for NASAL specimens only), is one component of Aviyon Hocevar comprehensive MRSA colonization surveillance program. It is not intended to diagnose MRSA infection nor to guide or monitor treatment for MRSA infections. Test performance is not FDA approved in patients less than 55 years old. Performed at [facility], [address] [place], [place] [address]          Radiology Studies: ECHOCARDIOGRAM COMPLETE Result Date: 02/25/2024    ECHOCARDIOGRAM REPORT   Patient Name:   [facility] Date  of Exam: 02/25/2024 Medical Rec #:  [id number]             Height:       69.0 in Accession #:    [id number]            Weight:       187.2 lb Date of Birth:  April 26, 1975            BSA:          2.009 m Patient Age:    48 years              BP:           168/97 mmHg Patient Gender: M                     HR:           90 bpm. Exam Location:  Inpatient Procedure: 2D Echo, Cardiac Doppler and Color Doppler (Both Spectral and Color            Flow Doppler were utilized during procedure). Indications:    Elevated Troponin  History:        Patient has prior history of Echocardiogram examinations, most                 recent 08/29/2023. CHF, Signs/Symptoms:Shortness of Breath; Risk                 Factors:Hypertension, Former Smoker and Sleep Apnea.  Sonographer:    Juliene Rucks Referring Phys:  [id number] SHENG L HALEY IMPRESSIONS  1. Left ventricular wall with speckled pattern, would benefit from cardiac MR to rule out infiltrative disease . Left ventricular ejection fraction, by estimation, is 55 to 60%. The left ventricle has normal function. The left ventricle has no regional wall motion abnormalities. There is severe concentric left ventricular hypertrophy. Left ventricular diastolic parameters are consistent with Grade II diastolic dysfunction (pseudonormalization). Elevated left ventricular end-diastolic pressure. There is  the interventricular septum is flattened in systole, consistent with right ventricular pressure overload.  2. Right ventricular systolic function is low normal. The right ventricular size is mildly enlarged. There is moderately elevated pulmonary artery systolic pressure.  3. Left atrial size was moderately dilated.  4. Right atrial size was moderately dilated.  5. Ammar Moffatt small pericardial effusion is present. The pericardial effusion is anterior to the right ventricle.  6. Mitral valve apparatus appears to have Deandre Brannan thickened mass at the base. This was present in prior echo doe 08/2023 and 12/2022 - suspect this is due to thickened papillary muscle. The mitral valve is normal in structure. Mild mitral valve regurgitation. No evidence of mitral stenosis.  7. The tricuspid valve is abnormal. Tricuspid valve regurgitation is moderate.  8. The aortic valve is normal in structure. Aortic valve regurgitation is not visualized. Aortic valve sclerosis is present, with no evidence of aortic valve stenosis.  9. There is borderline dilatation of the aortic root, measuring 36 mm. FINDINGS  Left Ventricle: Left ventricular wall with speckled pattern, would benefit from cardiac MR to rule out infiltrative disease. Left ventricular ejection fraction, by estimation, is 55 to 60%. The left ventricle has normal function. The left ventricle has no regional wall motion abnormalities. The left ventricular  internal cavity size was normal in size. There is severe concentric left ventricular hypertrophy. The interventricular septum is flattened in systole, consistent with right ventricular pressure overload. Left ventricular diastolic parameters are consistent with Grade II diastolic dysfunction (pseudonormalization). Elevated left  ventricular end-diastolic pressure. Right Ventricle: The right ventricular size is mildly enlarged. No increase in right ventricular wall thickness. Right ventricular systolic function is low normal. There is moderately elevated pulmonary artery systolic pressure. The tricuspid regurgitant  velocity is 3.49 m/s, and with an assumed right atrial pressure of 8 mmHg, the estimated right ventricular systolic pressure is 56.7 mmHg. Left Atrium: Left atrial size was moderately dilated. Right Atrium: Right atrial size was moderately dilated. Pericardium: Gerrald Basu small pericardial effusion is present. The pericardial effusion is anterior to the right ventricle. Mitral Valve: Mitral valve apparatus appears to have Riot Barrick thickened mass at the base. This was present in prior echo doe 08/2023 and 12/2022 - suspect this is due to thickened papillary muscle. The mitral valve is normal in structure. Mild mitral valve regurgitation. No evidence of mitral valve stenosis. Tricuspid Valve: The tricuspid valve is abnormal. Tricuspid valve regurgitation is moderate . No evidence of tricuspid stenosis. Aortic Valve: The aortic valve is normal in structure. Aortic valve regurgitation is not visualized. Aortic valve sclerosis is present, with no evidence of aortic valve stenosis. Pulmonic Valve: The pulmonic valve was normal in structure. Pulmonic valve regurgitation is not visualized. No evidence of pulmonic stenosis. Aorta: There is borderline dilatation of the aortic root, measuring 36 mm. Venous: The inferior vena cava was not well visualized. IAS/Shunts: No atrial level shunt detected by color flow Doppler.  LEFT  VENTRICLE PLAX 2D LVIDd:         4.60 cm     Diastology LVIDs:         3.00 cm     LV e' medial:    6.05 cm/s LV PW:         1.70 cm     LV E/e' medial:  21.2 LV IVS:        1.70 cm     LV e' lateral:   6.05 cm/s LVOT diam:     2.30 cm     LV E/e' lateral: 21.2 LV SV:         72 LV SV Index:   36 LVOT Area:     4.15 cm LV IVRT:       95 msec  LV Volumes (MOD) LV vol d, MOD A4C: 96.6 ml LV vol s, MOD A4C: 41.6 ml LV SV MOD A4C:     96.6 ml RIGHT VENTRICLE RV Basal diam:  4.70 cm RV Mid diam:    3.20 cm LEFT ATRIUM             Index        RIGHT ATRIUM           Index LA diam:        5.40 cm 2.69 cm/m   RA Area:     21.90 cm LA Vol (A2C):   96.0 ml 47.79 ml/m  RA Volume:   78.70 ml  39.18 ml/m LA Vol (A4C):   83.3 ml 41.47 ml/m LA Biplane Vol: 89.9 ml 44.76 ml/m  AORTIC VALVE LVOT Vmax:   119.00 cm/s LVOT Vmean:  76.400 cm/s LVOT VTI:    0.173 m  AORTA Ao Root diam: 3.60 cm Ao Asc diam:  2.80 cm MITRAL VALVE                TRICUSPID VALVE MV Area (PHT): 5.42 cm     TR Peak grad:   48.7 mmHg MV Decel Time: 140 msec     TR Vmax:  349.00 cm/s MR Peak grad: 45.4 mmHg MR Vmax:      337.00 cm/s   SHUNTS MV E velocity: 128.00 cm/s  Systemic VTI:  0.17 m MV Kayton Dunaj velocity: 98.90 cm/s   Systemic Diam: 2.30 cm MV E/Mckensie Scotti ratio:  1.29 Kardie Tobb DO Electronically signed by Kardie Tobb DO Signature Date/Time: 02/25/2024/11:47:10 AM    Final         Scheduled Meds:  amLODipine   10 mg Oral Daily   aspirin  EC  81 mg Oral Daily   atorvastatin   20 mg Oral QHS   calcitRIOL   0.5 mcg Oral Daily   calcium  carbonate  400 mg of elemental calcium  Oral BID   doxazosin   4 mg Oral Daily   feeding supplement  237 mL Oral BID BM   hydrALAZINE   100 mg Oral TID   isosorbide  mononitrate  120 mg Oral Daily   sertraline   50 mg Oral Daily   torsemide   20 mg Oral Daily   Continuous Infusions:  ceFAZolin        LOS: 3 days    Time spent: over 30 min     Meliton Monte, MD [facility]   To contact the  attending provider between 7A-7P or the covering provider during after hours 7P-7A, please log into the web site [email or web address] and access using universal [place] password for that web site. If you do not have the password, please call the hospital operator.  02/26/2024, 2:29 PM

## 2024-02-26 NOTE — Anesthesia Procedure Notes (Signed)
 Anesthesia Regional Block: Supraclavicular block   Pre-Anesthetic Checklist: , timeout performed,  Correct Patient, Correct Site, Correct Laterality,  Correct Procedure, Correct Position, site marked,  Risks and benefits discussed,  Surgical consent,  Pre-op evaluation,  At surgeon's request and post-op pain management  Laterality: Left  Prep: chloraprep       Needles:  Injection technique: Single-shot  Needle Type: Echogenic Needle     Needle Length: 9cm  Needle Gauge: 21     Additional Needles:   Procedures:,,,, ultrasound used (permanent image in chart),,    Narrative:  Start time: 02/26/2024 7:55 AM End time: 02/26/2024 8:03 AM Injection made incrementally with aspirations every 5 mL.  Performed by: Personally  Anesthesiologist: Corinne Garnette BRAVO, MD  Additional Notes: No pain on injection. No increased resistance to injection. Injection made in 5cc increments.  Good needle visualization.  Patient tolerated procedure well.

## 2024-02-26 NOTE — Anesthesia Postprocedure Evaluation (Signed)
 Anesthesia Post Note  Patient: Mark Hayden  Procedure(s) Performed: LEFT BRACHIOCEPHALIC ARTERIOVENOUS FISTULA CREATION (Left: Arm Upper)     Patient location during evaluation: Nursing Unit Anesthesia Type: Regional Level of consciousness: awake and alert Pain management: pain level controlled Vital Signs Assessment: post-procedure vital signs reviewed and stable Respiratory status: spontaneous breathing, nonlabored ventilation and respiratory function stable Cardiovascular status: stable and blood pressure returned to baseline Postop Assessment: no apparent nausea or vomiting Anesthetic complications: no   No notable events documented.  Last Vitals:  Vitals:   02/26/24 1523 02/26/24 1601  BP: (!) 145/87 139/78  Pulse: 82   Resp: 20   Temp: 36.5 C   SpO2:      Last Pain:  Vitals:   02/26/24 1523  TempSrc: Oral  PainSc:                  Garnette FORBES Skillern

## 2024-02-26 NOTE — Plan of Care (Signed)

## 2024-02-26 NOTE — Progress Notes (Signed)
 [facility] Progress Note   Assessment/ Plan:   CKD IV/V, progressive Most likely secondary to uncontrolled hypertension. On admission Cr similar to previous at 4.71. GFR has been around 15 for about a year.  Unfortunately was unable to connect with outpatient nephrology.  Was on sodium bicarb 650 BID at home.  - added back torsemide  40 mg daily - s/p AVF creation - can go tomorrow 12/11- will follow up with us  in clinic   2. Secondary Hyperparathyroidism  Ca today 6.6. PTH in October was 633. Most likely patient has secondary hyperparathyroidism due to CKD.  - PTH, vit D - Will start calcitriol  - TUMS as well   3. Anemia  Hgb 8.4. No signs of blood loss and hgb similar to prior. - CBC, iron  panel- add on today - Start weekly ESA +/- IV iron    4. HFpEF  EF 60-65% in June 2025. At home patient on farxiga  10 mg and torsemide  10 mg as needed. Currently euvolemic. Stop farxiga .  - Continue farxiga .  - Does not need further diuresis inpatient. - Can continue home diuresis per primary team and cardiology discretion.    5. Hypertension  Improved from admission. Important to maintain control as possible to prevent worsening progression of CKD. Barrier to medication adherence seems to be mood disorders.  - Continue per primary and cardiology teams.   Subjective:    Seen in room. S/p AVFcreation by VVS, appreciate assistance    Objective:   BP (!) 150/96 (BP Location: Right Arm)   Pulse 79   Temp 97.7 F (36.5 C) (Oral)   Resp 19   Ht 5' 9 (1.753 m)   Wt 84.9 kg   SpO2 97%   BMI 27.64 kg/m   Intake/Output Summary (Last 24 hours) at 02/26/2024 1431 Last data filed at 02/26/2024 0945 Gross per 24 hour  Intake 300 ml  Output 1445 ml  Net -1145 ml   Weight change:   Physical Exam: Gen:NAD CVS: RRR, loud S2 Resp: clear Abd: soft Ext: trace LE edema ACCESS: none L AVF + T/B  Imaging: ECHOCARDIOGRAM COMPLETE Result Date: 02/25/2024    ECHOCARDIOGRAM  REPORT   Patient Name:   [facility] Date of Exam: 02/25/2024 Medical Rec #:  [id number]             Height:       69.0 in Accession #:    [id number]            Weight:       187.2 lb Date of Birth:  28-Nov-1975            BSA:          2.009 m Patient Age:    48 years              BP:           168/97 mmHg Patient Gender: M                     HR:           90 bpm. Exam Location:  Inpatient Procedure: 2D Echo, Cardiac Doppler and Color Doppler (Both Spectral and Color            Flow Doppler were utilized during procedure). Indications:    Elevated Troponin  History:        Patient has prior history of Echocardiogram examinations, most  recent 08/29/2023. CHF, Signs/Symptoms:Shortness of Breath; Risk                 Factors:Hypertension, Former Smoker and Sleep Apnea.  Sonographer:    Juliene Rucks Referring Phys: [id number] SHENG L HALEY IMPRESSIONS  1. Left ventricular wall with speckled pattern, would benefit from cardiac MR to rule out infiltrative disease . Left ventricular ejection fraction, by estimation, is 55 to 60%. The left ventricle has normal function. The left ventricle has no regional wall motion abnormalities. There is severe concentric left ventricular hypertrophy. Left ventricular diastolic parameters are consistent with Grade II diastolic dysfunction (pseudonormalization). Elevated left ventricular end-diastolic pressure. There is  the interventricular septum is flattened in systole, consistent with right ventricular pressure overload.  2. Right ventricular systolic function is low normal. The right ventricular size is mildly enlarged. There is moderately elevated pulmonary artery systolic pressure.  3. Left atrial size was moderately dilated.  4. Right atrial size was moderately dilated.  5. A small pericardial effusion is present. The pericardial effusion is anterior to the right ventricle.  6. Mitral valve apparatus appears to have a thickened mass at the base. This was  present in prior echo doe 08/2023 and 12/2022 - suspect this is due to thickened papillary muscle. The mitral valve is normal in structure. Mild mitral valve regurgitation. No evidence of mitral stenosis.  7. The tricuspid valve is abnormal. Tricuspid valve regurgitation is moderate.  8. The aortic valve is normal in structure. Aortic valve regurgitation is not visualized. Aortic valve sclerosis is present, with no evidence of aortic valve stenosis.  9. There is borderline dilatation of the aortic root, measuring 36 mm. FINDINGS  Left Ventricle: Left ventricular wall with speckled pattern, would benefit from cardiac MR to rule out infiltrative disease. Left ventricular ejection fraction, by estimation, is 55 to 60%. The left ventricle has normal function. The left ventricle has no regional wall motion abnormalities. The left ventricular internal cavity size was normal in size. There is severe concentric left ventricular hypertrophy. The interventricular septum is flattened in systole, consistent with right ventricular pressure overload. Left ventricular diastolic parameters are consistent with Grade II diastolic dysfunction (pseudonormalization). Elevated left ventricular end-diastolic pressure. Right Ventricle: The right ventricular size is mildly enlarged. No increase in right ventricular wall thickness. Right ventricular systolic function is low normal. There is moderately elevated pulmonary artery systolic pressure. The tricuspid regurgitant  velocity is 3.49 m/s, and with an assumed right atrial pressure of 8 mmHg, the estimated right ventricular systolic pressure is 56.7 mmHg. Left Atrium: Left atrial size was moderately dilated. Right Atrium: Right atrial size was moderately dilated. Pericardium: A small pericardial effusion is present. The pericardial effusion is anterior to the right ventricle. Mitral Valve: Mitral valve apparatus appears to have a thickened mass at the base. This was present in prior echo  doe 08/2023 and 12/2022 - suspect this is due to thickened papillary muscle. The mitral valve is normal in structure. Mild mitral valve regurgitation. No evidence of mitral valve stenosis. Tricuspid Valve: The tricuspid valve is abnormal. Tricuspid valve regurgitation is moderate . No evidence of tricuspid stenosis. Aortic Valve: The aortic valve is normal in structure. Aortic valve regurgitation is not visualized. Aortic valve sclerosis is present, with no evidence of aortic valve stenosis. Pulmonic Valve: The pulmonic valve was normal in structure. Pulmonic valve regurgitation is not visualized. No evidence of pulmonic stenosis. Aorta: There is borderline dilatation of the aortic root, measuring 36 mm. Venous: The inferior vena  cava was not well visualized. IAS/Shunts: No atrial level shunt detected by color flow Doppler.  LEFT VENTRICLE PLAX 2D LVIDd:         4.60 cm     Diastology LVIDs:         3.00 cm     LV e' medial:    6.05 cm/s LV PW:         1.70 cm     LV E/e' medial:  21.2 LV IVS:        1.70 cm     LV e' lateral:   6.05 cm/s LVOT diam:     2.30 cm     LV E/e' lateral: 21.2 LV SV:         72 LV SV Index:   36 LVOT Area:     4.15 cm LV IVRT:       95 msec  LV Volumes (MOD) LV vol d, MOD A4C: 96.6 ml LV vol s, MOD A4C: 41.6 ml LV SV MOD A4C:     96.6 ml RIGHT VENTRICLE RV Basal diam:  4.70 cm RV Mid diam:    3.20 cm LEFT ATRIUM             Index        RIGHT ATRIUM           Index LA diam:        5.40 cm 2.69 cm/m   RA Area:     21.90 cm LA Vol (A2C):   96.0 ml 47.79 ml/m  RA Volume:   78.70 ml  39.18 ml/m LA Vol (A4C):   83.3 ml 41.47 ml/m LA Biplane Vol: 89.9 ml 44.76 ml/m  AORTIC VALVE LVOT Vmax:   119.00 cm/s LVOT Vmean:  76.400 cm/s LVOT VTI:    0.173 m  AORTA Ao Root diam: 3.60 cm Ao Asc diam:  2.80 cm MITRAL VALVE                TRICUSPID VALVE MV Area (PHT): 5.42 cm     TR Peak grad:   48.7 mmHg MV Decel Time: 140 msec     TR Vmax:        349.00 cm/s MR Peak grad: 45.4 mmHg MR Vmax:       337.00 cm/s   SHUNTS MV E velocity: 128.00 cm/s  Systemic VTI:  0.17 m MV A velocity: 98.90 cm/s   Systemic Diam: 2.30 cm MV E/A ratio:  1.29 Kardie Tobb DO Electronically signed by Dub Huntsman DO Signature Date/Time: 02/25/2024/11:47:10 AM    Final     Labs: BMET Recent Labs  Lab 02/23/24 0639 02/24/24 1006 02/25/24 0150 02/26/24 0517  NA 141 139 139 137  K 3.7 3.3* 3.4* 4.1  CL 108 111 111 109  CO2 18* 21* 16* 22  GLUCOSE 115* 138* 99 103*  BUN 40* 39* 35* 37*  CREATININE 4.71* 4.71* 4.59* 4.60*  CALCIUM  7.0* 6.6* 6.5* 7.3*   CBC Recent Labs  Lab 02/23/24 0639 02/24/24 1006 02/25/24 0150 02/26/24 0517  WBC 6.4 6.5 5.6 5.9  NEUTROABS 5.0  --   --   --   HGB 9.2* 8.4* 7.5* 7.6*  HCT 28.6* 26.3* 23.4* 24.3*  MCV 89.4 90.1 91.1 91.4  PLT 187 189 143* 145*    Medications:     amLODipine   10 mg Oral Daily   aspirin  EC  81 mg Oral Daily   atorvastatin   20 mg Oral QHS   calcitRIOL   0.5  mcg Oral Daily   calcium  carbonate  400 mg of elemental calcium  Oral BID   doxazosin   4 mg Oral Daily   feeding supplement  237 mL Oral BID BM   hydrALAZINE   100 mg Oral TID   isosorbide  mononitrate  120 mg Oral Daily   sertraline   50 mg Oral Daily   torsemide   20 mg Oral Daily    Almarie Bonine, MD 02/26/2024, 2:31 PM

## 2024-02-26 NOTE — Transfer of Care (Signed)
 Immediate Anesthesia Transfer of Care Note  Patient: Mark Hayden  Procedure(s) Performed: LEFT BRACHIOCEPHALIC ARTERIOVENOUS FISTULA CREATION (Left: Arm Upper)  Patient Location: PACU  Anesthesia Type:MAC and Regional  Level of Consciousness: drowsy and responds to stimulation  Airway & Oxygen Therapy: Patient Spontanous Breathing and Patient connected to face mask oxygen  Post-op Assessment: Report given to RN and Post -op Vital signs reviewed and stable  Post vital signs: Reviewed and stable  Last Vitals:  Vitals Value Taken Time  BP 147/89 02/26/24 09:52  Temp    Pulse 78 02/26/24 09:56  Resp 22 02/26/24 09:56  SpO2 100 % 02/26/24 09:56  Vitals shown include unfiled device data.  Last Pain:  Vitals:   02/26/24 0730  TempSrc: Oral  PainSc: 0-No pain      Patients Stated Pain Goal: 0 (02/24/24 1122)  Complications: No notable events documented.

## 2024-02-26 NOTE — Op Note (Signed)
° ° °  Patient name: Mark Hayden MRN: [id number] DOB: 05/29/1975 Sex: male  02/26/2024 Pre-operative Diagnosis: CKD IV Post-operative diagnosis:  Same Surgeon:  Malvina New Procedure:   Left brachiocephalic fistula Anesthesia:  regional Blood Loss:  minimal Specimens:  none  Findings: 4 mm disease-free artery.  The vein measured about 3 mm  Indications: This is a 48 year old gentleman nearing dialysis.  Renal has requested that we place access  Procedure:  The patient was identified in the holding area and taken to [facility] OR ROOM 16  The patient was then placed supine on the table. regional anesthesia was administered.  The patient was prepped and draped in the usual sterile fashion.  A time out was called and antibiotics were administered.  Ultrasound was used to evaluate the surface veins in the left arm.  The cephalic vein appeared to be of adequate caliber measuring roughly 3 mm.  A transverse incision was made just proximal to the antecubital crease.  I first dissected out the artery which was a disease-free 4 mm artery.  This was encircled with Vesseloops.  I then exposed the cephalic vein and mobilized it throughout the width of the incision.  The vein measured roughly 3 mm.  Once it was fully mobilized it was marked for orientation and ligated distally.  The vein distended to roughly 4 mm with heparinized saline.  Next, the brachial artery was occluded with vascular clamps and a #11 blade was used to make an arteriotomy which was extended longitudinally with Potts scissors.  The vein was spatulated and cut to fit the size the arteriotomy.  A running anastomosis was created with 6-0 Prolene.  Prior to completion, the appropriate flushing maneuvers were performed and the anastomosis was completed.  There was a palpable thrill within the fistula.  I inspected the course of the vein to make sure there were no kinks.  The patient had a brisk radial and ulnar Doppler signal.  The wound was  irrigated.  Hemostasis was achieved.  The deep tissue was reapproximated with 3-0 Vicryl and the skin was closed with 4-0 Vicryl followed by Dermabond.  There were no immediate complications.   Disposition: To PACU stable.   ALONSO Malvina New, M.D., [facility] [place] Office: [phone] Pager:  [phone]

## 2024-02-26 NOTE — Progress Notes (Signed)
 [facility] night cross cover note:   I was notified by the patient's RN that the patient is complaining of anxiety and difficulty sleeping.  I subsequently ordered Ativan  0.5 mg IV x 1 dose now to address the above.     Eva Pore, DO Hospitalist

## 2024-02-27 ENCOUNTER — Encounter (HOSPITAL_COMMUNITY): Payer: Self-pay | Admitting: Surgery

## 2024-02-27 ENCOUNTER — Inpatient Hospital Stay (HOSPITAL_COMMUNITY)

## 2024-02-27 ENCOUNTER — Other Ambulatory Visit (HOSPITAL_COMMUNITY): Payer: Self-pay

## 2024-02-27 ENCOUNTER — Other Ambulatory Visit: Payer: Self-pay

## 2024-02-27 DIAGNOSIS — I517 Cardiomegaly: Secondary | ICD-10-CM

## 2024-02-27 DIAGNOSIS — I10 Essential (primary) hypertension: Secondary | ICD-10-CM

## 2024-02-27 DIAGNOSIS — R0602 Shortness of breath: Secondary | ICD-10-CM

## 2024-02-27 LAB — CBC WITH DIFFERENTIAL/PLATELET
Abs Immature Granulocytes: 0.03 K/uL (ref 0.00–0.07)
Basophils Absolute: 0 K/uL (ref 0.0–0.1)
Basophils Relative: 1 %
Eosinophils Absolute: 0.1 K/uL (ref 0.0–0.5)
Eosinophils Relative: 2 %
HCT: 23.3 % — ABNORMAL LOW (ref 39.0–52.0)
Hemoglobin: 7.2 g/dL — ABNORMAL LOW (ref 13.0–17.0)
Immature Granulocytes: 1 %
Lymphocytes Relative: 7 %
Lymphs Abs: 0.5 K/uL — ABNORMAL LOW (ref 0.7–4.0)
MCH: 28.8 pg (ref 26.0–34.0)
MCHC: 30.9 g/dL (ref 30.0–36.0)
MCV: 93.2 fL (ref 80.0–100.0)
Monocytes Absolute: 0.5 K/uL (ref 0.1–1.0)
Monocytes Relative: 7 %
Neutro Abs: 5.5 K/uL (ref 1.7–7.7)
Neutrophils Relative %: 82 %
Platelets: 126 K/uL — ABNORMAL LOW (ref 150–400)
RBC: 2.5 MIL/uL — ABNORMAL LOW (ref 4.22–5.81)
RDW: 17 % — ABNORMAL HIGH (ref 11.5–15.5)
WBC: 6.6 K/uL (ref 4.0–10.5)
nRBC: 0 % (ref 0.0–0.2)

## 2024-02-27 LAB — COMPREHENSIVE METABOLIC PANEL WITH GFR
ALT: 24 U/L (ref 0–44)
AST: 17 U/L (ref 15–41)
Albumin: 3 g/dL — ABNORMAL LOW (ref 3.5–5.0)
Alkaline Phosphatase: 101 U/L (ref 38–126)
Anion gap: 7 (ref 5–15)
BUN: 39 mg/dL — ABNORMAL HIGH (ref 6–20)
CO2: 22 mmol/L (ref 22–32)
Calcium: 7.4 mg/dL — ABNORMAL LOW (ref 8.9–10.3)
Chloride: 107 mmol/L (ref 98–111)
Creatinine, Ser: 4.36 mg/dL — ABNORMAL HIGH (ref 0.61–1.24)
GFR, Estimated: 16 mL/min — ABNORMAL LOW (ref >60)
Glucose, Bld: 103 mg/dL — ABNORMAL HIGH (ref 70–99)
Potassium: 4.2 mmol/L (ref 3.5–5.1)
Sodium: 136 mmol/L (ref 135–145)
Total Bilirubin: 0.8 mg/dL (ref 0.0–1.2)
Total Protein: 5.9 g/dL — ABNORMAL LOW (ref 6.5–8.1)

## 2024-02-27 LAB — MAGNESIUM: Magnesium: 1.9 mg/dL (ref 1.7–2.4)

## 2024-02-27 LAB — PHOSPHORUS: Phosphorus: 4 mg/dL (ref 2.5–4.6)

## 2024-02-27 MED ORDER — SODIUM BICARBONATE 650 MG PO TABS
650.0000 mg | ORAL_TABLET | Freq: Two times a day (BID) | ORAL | 1 refills | Status: AC
  Filled 2024-02-27: qty 60, 30d supply, fill #0

## 2024-02-27 MED ORDER — IRON SUCROSE 200 MG IVPB - SIMPLE MED
200.0000 mg | Freq: Once | Status: AC
  Administered 2024-02-27: 200 mg via INTRAVENOUS
  Filled 2024-02-27: qty 200

## 2024-02-27 MED ORDER — CALCITRIOL 0.5 MCG PO CAPS
0.5000 ug | ORAL_CAPSULE | Freq: Every day | ORAL | 1 refills | Status: AC
  Filled 2024-02-27: qty 30, 30d supply, fill #0

## 2024-02-27 MED ORDER — CALCIUM CARBONATE ANTACID 500 MG PO CHEW
400.0000 mg | CHEWABLE_TABLET | Freq: Two times a day (BID) | ORAL | 1 refills | Status: AC
  Filled 2024-02-27: qty 120, 30d supply, fill #0

## 2024-02-27 MED ORDER — FERROUS SULFATE 325 (65 FE) MG PO TBEC: 325.0000 mg | DELAYED_RELEASE_TABLET | Freq: Every day | ORAL | Status: DC

## 2024-02-27 MED ORDER — DARBEPOETIN ALFA 150 MCG/0.3ML IJ SOSY
150.0000 ug | PREFILLED_SYRINGE | INTRAMUSCULAR | Status: AC
  Administered 2024-02-27: 150 ug via SUBCUTANEOUS
  Filled 2024-02-27: qty 0.3

## 2024-02-27 MED ORDER — TORSEMIDE 20 MG PO TABS
20.0000 mg | ORAL_TABLET | Freq: Every day | ORAL | 1 refills | Status: AC
  Filled 2024-02-27: qty 30, 30d supply, fill #0

## 2024-02-27 MED FILL — Ferrous Sulfate Tab 325 MG (65 MG Elemental Fe): 325.0000 mg | ORAL | 30 days supply | Qty: 30 | Fill #0 | Status: AC

## 2024-02-27 MED FILL — Ferrous Sulfate Tab EC 325 MG (65 MG Fe Equivalent): 325.0000 mg | ORAL | 30 days supply | Qty: 30 | Fill #0 | Status: CN

## 2024-02-27 NOTE — Progress Notes (Signed)
 RN discussed transport for this patient with child psychotherapist. Patient in need of a taxi voucher. RN clarified with social worker if patient is able to discharge with this transportation as patient lives in [place].   Will be sending home with a taxi voucher.

## 2024-02-27 NOTE — Progress Notes (Signed)
 RN spoke with patient's sister at bedside. Patient and his sister had questions for nephrologist about kidney transplant list. RN secured chat MD, family made aware.  RN discussed with patient and sister that RN was awaiting message back from MD. Patient and sister now preferred to wait to ask questions in patient's upcoming nephrology appointment. No further questions.  Sister at bedside will now be taking patient home instead of taxi voucher. Both patient and sister were agreeable to discharge.

## 2024-02-27 NOTE — Progress Notes (Signed)
°  Progress Note    02/27/2024 8:18 AM 1 Day Post-Op  Subjective:  no complaints   Vitals:   02/27/24 0351 02/27/24 0511  BP: (!) 167/87 (!) 154/95  Pulse: 90 94  Resp: (!) 22 14  Temp: 99.3 F (37.4 C)   SpO2: 94% 100%   Physical Exam: Lungs:  non labored Incisions:  L arm c/d/i Extremities:  palpable thrill near anastomosis; palpable L radial pulse Neurologic: A&O  CBC    Component Value Date/Time   WBC 6.6 02/27/2024 0310   RBC 2.50 (L) 02/27/2024 0310   HGB 7.2 (L) 02/27/2024 0310   HCT 23.3 (L) 02/27/2024 0310   PLT 126 (L) 02/27/2024 0310   MCV 93.2 02/27/2024 0310   MCH 28.8 02/27/2024 0310   MCHC 30.9 02/27/2024 0310   RDW 17.0 (H) 02/27/2024 0310   LYMPHSABS 0.5 (L) 02/27/2024 0310   MONOABS 0.5 02/27/2024 0310   EOSABS 0.1 02/27/2024 0310   BASOSABS 0.0 02/27/2024 0310    BMET    Component Value Date/Time   NA 136 02/27/2024 0310   NA 141 01/21/2023 1209   K 4.2 02/27/2024 0310   CL 107 02/27/2024 0310   CO2 22 02/27/2024 0310   GLUCOSE 103 (H) 02/27/2024 0310   BUN 39 (H) 02/27/2024 0310   BUN 61 (H) 01/21/2023 1209   CREATININE 4.36 (H) 02/27/2024 0310   CALCIUM  7.4 (L) 02/27/2024 0310   GFRNONAA 16 (L) 02/27/2024 0310   GFRAA >60 09/09/2019 0624    INR    Component Value Date/Time   INR 1.1 01/08/2023 0326     Intake/Output Summary (Last 24 hours) at 02/27/2024 0818 Last data filed at 02/27/2024 0742 Gross per 24 hour  Intake 780 ml  Output 1320 ml  Net -540 ml     Assessment/Plan:  48 y.o. male is s/p L brachiocephalic fistula 1 Day Post-Op   Patent L arm brachiocephalic fistula with palpable thrill.  No signs or symptoms of steal syndrome; palpable radial pulse.  Office will arrange fistula duplex in 6 weeks.  Ok for discharge from vascular standpoint.   Donnice Sender, PA-C [facility] [phone] 02/27/2024 8:18 AM

## 2024-02-27 NOTE — Plan of Care (Signed)
  Problem: Clinical Measurements: Goal: Diagnostic test results will improve Outcome: Progressing Goal: Respiratory complications will improve Outcome: Progressing Goal: Cardiovascular complication will be avoided Outcome: Progressing   Problem: Activity: Goal: Risk for activity intolerance will decrease Outcome: Progressing   Problem: Coping: Goal: Level of anxiety will decrease Outcome: Progressing   Problem: Pain Managment: Goal: General experience of comfort will improve and/or be controlled Outcome: Progressing   Problem: Safety: Goal: Ability to remain free from injury will improve Outcome: Progressing

## 2024-02-27 NOTE — Progress Notes (Signed)
 Rounding Note   Patient Name: Mark Hayden Date of Encounter: 02/27/2024  [facility] Cardiologist: None   Subjective BP 154/95.  Creatinine stable at 4.36.  Denies any chest pain or dyspnea  Scheduled Meds:  amLODipine   10 mg Oral Daily   aspirin  EC  81 mg Oral Daily   atorvastatin   20 mg Oral QHS   calcitRIOL   0.5 mcg Oral Daily   calcium  carbonate  400 mg of elemental calcium  Oral BID   doxazosin   4 mg Oral Daily   feeding supplement  237 mL Oral BID BM   hydrALAZINE   100 mg Oral TID   isosorbide  mononitrate  120 mg Oral Daily   sertraline   50 mg Oral Daily   torsemide   20 mg Oral Daily   Continuous Infusions:   PRN Meds: acetaminophen  **OR** acetaminophen , guaiFENesin , HYDROmorphone  (DILAUDID ) injection, nitroGLYCERIN , ondansetron  **OR** ondansetron  (ZOFRAN ) IV   Vital Signs  Vitals:   02/26/24 2159 02/26/24 2241 02/27/24 0351 02/27/24 0511  BP:  (!) 147/77 (!) 167/87 (!) 154/95  Pulse: 96 91 90 94  Resp:  (!) 24 (!) 22 14  Temp:  98.2 F (36.8 C) 99.3 F (37.4 C)   TempSrc:  Oral Oral   SpO2: 97% 98% 94% 100%  Weight:      Height:        Intake/Output Summary (Last 24 hours) at 02/27/2024 0841 Last data filed at 02/27/2024 0742 Gross per 24 hour  Intake 730 ml  Output 1320 ml  Net -590 ml      02/23/2024    2:44 PM 02/23/2024   12:14 PM 02/23/2024    9:53 AM  Last 3 Weights  Weight (lbs) 187 lb 2.7 oz 190 lb 14.7 oz 190 lb 14.7 oz  Weight (kg) 84.9 kg 86.6 kg 86.6 kg      Telemetry Normal sinus rhythm- Personally Reviewed  ECG  No new ECG- Personally Reviewed  Physical Exam  GEN: No acute distress.   Neck: no JVD Cardiac: RRR, no murmurs, rubs, or gallops.  Respiratory: Clear to auscultation bilaterally. GI: Soft, nontender, non-distended  MS: No edema; No deformity. Neuro:  Nonfocal  Psych: Normal affect   Labs High Sensitivity Troponin:   Recent Labs  Lab 02/24/24 1152 02/24/24 1435  TROPONINIHS 106* 104*      Chemistry Recent Labs  Lab 02/25/24 0150 02/26/24 0517 02/27/24 0310  NA 139 137 136  K 3.4* 4.1 4.2  CL 111 109 107  CO2 16* 22 22  GLUCOSE 99 103* 103*  BUN 35* 37* 39*  CREATININE 4.59* 4.60* 4.36*  CALCIUM  6.5* 7.3* 7.4*  MG 1.8  --  1.9  PROT  --   --  5.9*  ALBUMIN  --   --  3.0*  AST  --   --  17  ALT  --   --  24  ALKPHOS  --   --  101  BILITOT  --   --  0.8  GFRNONAA 15* 15* 16*  ANIONGAP 12 6 7     Lipids No results for input(s): CHOL, TRIG, HDL, LABVLDL, LDLCALC, CHOLHDL in the last 168 hours.  Hematology Recent Labs  Lab 02/25/24 0150 02/26/24 0517 02/27/24 0310  WBC 5.6 5.9 6.6  RBC 2.57* 2.66* 2.50*  HGB 7.5* 7.6* 7.2*  HCT 23.4* 24.3* 23.3*  MCV 91.1 91.4 93.2  MCH 29.2 28.6 28.8  MCHC 32.1 31.3 30.9  RDW 16.6* 17.2* 17.0*  PLT 143* 145* 126*   Thyroid  No results for input(s): TSH, FREET4 in the last 168 hours.  BNP Recent Labs  Lab 02/23/24 0639 02/24/24 1006  BNP  --  901.7*  PROBNP 25,589.0*  --     DDimer No results for input(s): DDIMER in the last 168 hours.   Radiology  No results found.   Cardiac Studies   Patient Profile   48 y.o. male with a hx of uncontrolled hypertension, chronic HFpEF, CKD, who is being seen 02/24/2024 for the evaluation of elevated troponins and chest pain and CHF   Assessment & Plan  Chest pain with elevated troponins  Presentation seems overall noncardiac and attributed to panic attacks in the setting of medication inducing anxiety.  Has nonexertional symptoms, describes pain as sharp stabbing.  EKG with no acute ST-T wave changes and stable on serial repeats. Troponins [phone] which would argue against ACS.  Not a surprising finding given underlying CKD with uncontrolled hypertension with BP 230/120 on arrival. Echo shows EF 55 to 60%, no wall motion abnormalities.  No further cardiac workup recommended at this time  LVH  Echo read as concerning for cardiac amyloid given  severe LVH.  Suspect likely due to uncontrolled hypertension.  Given his renal function, would hold off on cardiac MRI.  Will check AL amyloid labs and can plan outpatient PYP scan  Chronic HFpEF Often accompanied with his uncontrolled hypertension presentation.  Fortunately here he appears to be euvolemic, only with isolated JVD and no significant symptoms now.  EF 60 to 65% 08/2023 with moderate to severe concentric LVH likely related to chronic uncontrolled hypertension.  Normal RV.  Moderately dilated LA.  Mild MR. On torsemide  20 mg daily   Uncontrolled hypertension Long hx of this with recurrent hospitalizations in the setting of noncompliance and dietary indiscretions.  Documented since 2023. He reports he is noncompliant with this given medications give him severe anxiety/panic attacks.  BP improved today. Continue with amlodipine  10 mg daily, doxazosin  4 mg daily.  Would continue hydralazine  100 mg 3 times daily.  Continue Imdur  120 mg daily. Of note he reported GI symptoms on carvedilol .   CKD stage V Creatinine stable at 4.36 today, nephrology following  OSA Questionable compliancy on CPAP, likely contributing.  [facility] will sign off.   Medication Recommendations: No changes Other recommendations (labs, testing, etc): None Follow up as an outpatient: Will schedule    For questions or updates, please contact [facility] Please consult [email or web address] for contact info under     Signed, Lonni LITTIE Nanas, MD  02/27/2024, 8:41 AM

## 2024-02-27 NOTE — Progress Notes (Signed)
  [facility] Progress Note   Assessment/ Plan:   CKD IV/V, progressive Most likely secondary to uncontrolled hypertension. On admission Cr similar to previous at 4.71. GFR has been around 15 for about a year.  Unfortunately was unable to connect with outpatient nephrology.  Was on sodium bicarb 650 BID at home.  - added back torsemide  40 mg daily - s/p AVF creation - can d/c today 12/11- will follow up with us  in clinic- sent message to scheduler   2. Secondary Hyperparathyroidism  Ca today 6.6. PTH in October was 633. Most likely patient has secondary hyperparathyroidism due to CKD.  - PTH, vit D - Will start calcitriol  - TUMS as well - improving   3. Anemia  Hgb 8.4. No signs of blood loss and hgb similar to prior. - IV iron  today - aranesp   4. HFpEF  EF 60-65% in June 2025. At home patient on farxiga  10 mg and torsemide  10 mg as needed. Currently euvolemic. Stop farxiga .  - Continue farxiga .  - Does not need further diuresis inpatient. - Can continue home diuresis per primary team and cardiology discretion.    5. Hypertension  Improved from admission. Important to maintain control as possible to prevent worsening progression of CKD. Barrier to medication adherence seems to be mood disorders.  - Continue per primary and cardiology teams.   Subjective:    Sleepy, arousable.  Cr stable.  AVF + T/B    Objective:   BP (!) 145/85 (BP Location: Right Arm)   Pulse 99   Temp 99 F (37.2 C) (Oral)   Resp 15   Ht 5' 9 (1.753 m)   Wt 84.9 kg   SpO2 99%   BMI 27.64 kg/m   Intake/Output Summary (Last 24 hours) at 02/27/2024 0902 Last data filed at 02/27/2024 0742 Gross per 24 hour  Intake 730 ml  Output 1320 ml  Net -590 ml   Weight change:   Physical Exam: Gen:NAD CVS: RRR, loud S2 Resp: clear Abd: soft Ext: trace LE edema ACCESS: none L AVF + T/B  Imaging: No results found.   Labs: BMET Recent Labs  Lab 02/23/24 0639 02/24/24 1006  02/25/24 0150 02/26/24 0517 02/27/24 0310  NA 141 139 139 137 136  K 3.7 3.3* 3.4* 4.1 4.2  CL 108 111 111 109 107  CO2 18* 21* 16* 22 22  GLUCOSE 115* 138* 99 103* 103*  BUN 40* 39* 35* 37* 39*  CREATININE 4.71* 4.71* 4.59* 4.60* 4.36*  CALCIUM  7.0* 6.6* 6.5* 7.3* 7.4*  PHOS  --   --   --   --  4.0   CBC Recent Labs  Lab 02/23/24 0639 02/24/24 1006 02/25/24 0150 02/26/24 0517 02/27/24 0310  WBC 6.4 6.5 5.6 5.9 6.6  NEUTROABS 5.0  --   --   --  5.5  HGB 9.2* 8.4* 7.5* 7.6* 7.2*  HCT 28.6* 26.3* 23.4* 24.3* 23.3*  MCV 89.4 90.1 91.1 91.4 93.2  PLT 187 189 143* 145* 126*    Medications:     amLODipine   10 mg Oral Daily   aspirin  EC  81 mg Oral Daily   atorvastatin   20 mg Oral QHS   calcitRIOL   0.5 mcg Oral Daily   calcium  carbonate  400 mg of elemental calcium  Oral BID   doxazosin   4 mg Oral Daily   feeding supplement  237 mL Oral BID BM   hydrALAZINE   100 mg Oral TID   isosorbide  mononitrate  120 mg  Oral Daily   sertraline   50 mg Oral Daily   torsemide   20 mg Oral Daily    Almarie Bonine, MD 02/27/2024, 9:02 AM

## 2024-02-27 NOTE — Discharge Summary (Signed)
 Physician Discharge Summary   Patient: Mark Hayden MRN: [id number] DOB: 26-Dec-1975  Admit date:     02/23/2024  Discharge date: 02/27/2024  Discharge Physician: Sabas GORMAN Brod   PCP: [name] [name], MD   Recommendations at discharge:   Follow-up vascular surgery as outpatient Follow-up cardiology as outpatient Follow-up nephrology as outpatient  Discharge Diagnoses: Principal Problem:   Hypertensive urgency Active Problems:   Elevated troponin   Elevated brain natriuretic peptide (BNP) level   Shortness of breath   Nausea and vomiting   CKD (chronic kidney disease) stage 4, GFR 15-29 ml/min (HCC)   Hypertension   Chronic heart failure with preserved ejection fraction (HFpEF) (HCC)   LVH (left ventricular hypertrophy)   OSA (obstructive sleep apnea)   Depression   Noncompliance with medications  Resolved Problems:   * No resolved hospital problems. *  Hospital Course: Mark Hayden is a 48 y.o. male with a history of hypertension, hyperlipidemia, depression, CKD stage IV.  Patient presented secondary to shortness of breath and cough, found to have evidence of severely elevated blood pressure secondary to medication nonadherence. Antihypertensives resumed.  Assessment and Plan:  Hypertensive urgency Secondary to medication nonadherence. Patient initially restarted on home regimen with significant improvement in blood pressure; hydralazine  held temporarily secondary to significant drop in blood pressure but is now restarted -Continue Imdur , doxazosin , amlodipine  and hydralazine     Demand ischemia Elevated Troponins Troponins high and flat.  Suspected demand ischemia in setting of hypertensive urgency -Continue heparin  IV pending cardiology recommendations -Follow-up Transthoracic Echocardiogram - no RWMA   Chronic HFpEF Moderately Elevated Pulmonary Artery Systolic Pressure Echo with severe concentric LV hypertrophy - grade II diastolic  dysfunction.  Interventricular septum flattened in systole, c/w RV pressure overload.   Torsemide  20 mg p.o. daily    CKD stage IV Noted. Stable currently. S/p vascular access per vascular surgery Appreciate renal assistance - torsemide , bicarb, calcitriol , tums   Shortness of breath Resolved.   Nausea and vomiting Resolved.   Medication nonadherence  Currently has no home and is considering living with his sister. No reliable transportation complicates outpatient follow-up as well. - Will be discharged to home with sister   Depression Anxiety -Continue Zoloft    OSA -Continue CPAP at bedtime   Homelessness TOC consulted for resources          Consultants: Cardiology, vascular surgery Procedures performed:  Disposition: Home Diet recommendation:  Discharge Diet Orders (From admission, onward)     Start     Ordered   02/27/24 0000  Diet - low sodium heart healthy        02/27/24 0931           Regular diet DISCHARGE MEDICATION: Allergies as of 02/27/2024       Reactions   Fish Allergy Anaphylaxis, Itching   Shellfish Allergy Anaphylaxis, Itching   seafood        Medication List     TAKE these medications    amLODipine  10 MG tablet Commonly known as: NORVASC  Take 1 tablet (10 mg total) by mouth daily.   aspirin  EC 81 MG tablet Take 1 tablet (81 mg total) by mouth daily. Swallow whole.   atorvastatin  20 MG tablet Commonly known as: LIPITOR Take 1 tablet (20 mg total) by mouth daily.   calcitRIOL  0.5 MCG capsule Commonly known as: ROCALTROL  Take 1 capsule (0.5 mcg total) by mouth daily.   calcium  carbonate 500 MG chewable tablet Commonly known as: TUMS - dosed in mg elemental calcium   Chew 2 tablets (400 mg of elemental calcium  total) by mouth 2 (two) times daily.   doxazosin  4 MG tablet Commonly known as: CARDURA  Take 1 tablet (4 mg total) by mouth daily.   Farxiga  10 MG Tabs tablet Generic drug: dapagliflozin  propanediol Take 1  tablet (10 mg total) by mouth daily.   ferrous sulfate  325 (65 FE) MG EC tablet Take 1 tablet (325 mg total) by mouth daily with breakfast. Start taking on: February 28, 2024   hydrALAZINE  100 MG tablet Commonly known as: APRESOLINE  Take 1 tablet (100 mg total) by mouth 3 (three) times daily.   isosorbide  mononitrate 120 MG 24 hr tablet Commonly known as: IMDUR  Take 1 tablet (120 mg total) by mouth daily.   sertraline  50 MG tablet Commonly known as: Zoloft  Take 1 tablet (50 mg total) by mouth daily.   sodium bicarbonate  650 MG tablet Take 1 tablet (650 mg total) by mouth 2 (two) times daily.   torsemide  20 MG tablet Commonly known as: DEMADEX  Take 1 tablet (20 mg total) by mouth daily. What changed:  medication strength how much to take when to take this reasons to take this        Follow-up Information     [facility] Follow up in 6 week(s).   Specialty: Vascular Surgery Contact information: [address] [place]  [address] [phone]        Kate Lonni CROME, MD. Schedule an appointment as soon as possible for a visit.   Specialties: Cardiology, Radiology Contact information: [address] [place] [address] [phone]         Gearline Norris, MD. Schedule an appointment as soon as possible for a visit.   Specialty: Nephrology Contact information: [address] [place] [address] [phone]                Discharge Exam: Fredricka Weights   02/23/24 9046 02/23/24 1214 02/23/24 1444  Weight: 86.6 kg 86.6 kg 84.9 kg   General-appears in no acute distress Heart-S1-S2, regular, no murmur auscultated Lungs-clear to auscultation bilaterally, no wheezing or crackles auscultated Abdomen-soft, nontender, no organomegaly Extremities-no edema in the lower extremities Neuro-alert, oriented x3, no focal deficit noted  Condition at discharge:  good  The results of significant diagnostics from this hospitalization (including imaging, microbiology, ancillary and laboratory) are listed below for reference.   Imaging Studies: ECHOCARDIOGRAM COMPLETE Result Date: 02/25/2024    ECHOCARDIOGRAM REPORT   Patient Name:   [facility] Date of Exam: 02/25/2024 Medical Rec #:  [id number]             Height:       69.0 in Accession #:    [id number]            Weight:       187.2 lb Date of Birth:  [birth year]            BSA:          2.009 m Patient Age:    48 years              BP:           168/97 mmHg Patient Gender: M                     HR:           90 bpm. Exam Location:  Inpatient Procedure: 2D  Echo, Cardiac Doppler and Color Doppler (Both Spectral and Color            Flow Doppler were utilized during procedure). Indications:    Elevated Troponin  History:        Patient has prior history of Echocardiogram examinations, most                 recent 08/29/2023. CHF, Signs/Symptoms:Shortness of Breath; Risk                 Factors:Hypertension, Former Smoker and Sleep Apnea.  Sonographer:    Juliene Rucks Referring Phys: [id number] SHENG L HALEY IMPRESSIONS  1. Left ventricular wall with speckled pattern, would benefit from cardiac MR to rule out infiltrative disease . Left ventricular ejection fraction, by estimation, is 55 to 60%. The left ventricle has normal function. The left ventricle has no regional wall motion abnormalities. There is severe concentric left ventricular hypertrophy. Left ventricular diastolic parameters are consistent with Grade II diastolic dysfunction (pseudonormalization). Elevated left ventricular end-diastolic pressure. There is  the interventricular septum is flattened in systole, consistent with right ventricular pressure overload.  2. Right ventricular systolic function is low normal. The right ventricular size is mildly enlarged. There is moderately elevated pulmonary artery systolic pressure.  3. Left atrial size was  moderately dilated.  4. Right atrial size was moderately dilated.  5. A small pericardial effusion is present. The pericardial effusion is anterior to the right ventricle.  6. Mitral valve apparatus appears to have a thickened mass at the base. This was present in prior echo doe 08/2023 and 12/2022 - suspect this is due to thickened papillary muscle. The mitral valve is normal in structure. Mild mitral valve regurgitation. No evidence of mitral stenosis.  7. The tricuspid valve is abnormal. Tricuspid valve regurgitation is moderate.  8. The aortic valve is normal in structure. Aortic valve regurgitation is not visualized. Aortic valve sclerosis is present, with no evidence of aortic valve stenosis.  9. There is borderline dilatation of the aortic root, measuring 36 mm. FINDINGS  Left Ventricle: Left ventricular wall with speckled pattern, would benefit from cardiac MR to rule out infiltrative disease. Left ventricular ejection fraction, by estimation, is 55 to 60%. The left ventricle has normal function. The left ventricle has no regional wall motion abnormalities. The left ventricular internal cavity size was normal in size. There is severe concentric left ventricular hypertrophy. The interventricular septum is flattened in systole, consistent with right ventricular pressure overload. Left ventricular diastolic parameters are consistent with Grade II diastolic dysfunction (pseudonormalization). Elevated left ventricular end-diastolic pressure. Right Ventricle: The right ventricular size is mildly enlarged. No increase in right ventricular wall thickness. Right ventricular systolic function is low normal. There is moderately elevated pulmonary artery systolic pressure. The tricuspid regurgitant  velocity is 3.49 m/s, and with an assumed right atrial pressure of 8 mmHg, the estimated right ventricular systolic pressure is 56.7 mmHg. Left Atrium: Left atrial size was moderately dilated. Right Atrium: Right atrial size  was moderately dilated. Pericardium: A small pericardial effusion is present. The pericardial effusion is anterior to the right ventricle. Mitral Valve: Mitral valve apparatus appears to have a thickened mass at the base. This was present in prior echo doe 08/2023 and 12/2022 - suspect this is due to thickened papillary muscle. The mitral valve is normal in structure. Mild mitral valve regurgitation. No evidence of mitral valve stenosis. Tricuspid Valve: The tricuspid valve is abnormal. Tricuspid valve regurgitation is moderate . No evidence  of tricuspid stenosis. Aortic Valve: The aortic valve is normal in structure. Aortic valve regurgitation is not visualized. Aortic valve sclerosis is present, with no evidence of aortic valve stenosis. Pulmonic Valve: The pulmonic valve was normal in structure. Pulmonic valve regurgitation is not visualized. No evidence of pulmonic stenosis. Aorta: There is borderline dilatation of the aortic root, measuring 36 mm. Venous: The inferior vena cava was not well visualized. IAS/Shunts: No atrial level shunt detected by color flow Doppler.  LEFT VENTRICLE PLAX 2D LVIDd:         4.60 cm     Diastology LVIDs:         3.00 cm     LV e' medial:    6.05 cm/s LV PW:         1.70 cm     LV E/e' medial:  21.2 LV IVS:        1.70 cm     LV e' lateral:   6.05 cm/s LVOT diam:     2.30 cm     LV E/e' lateral: 21.2 LV SV:         72 LV SV Index:   36 LVOT Area:     4.15 cm LV IVRT:       95 msec  LV Volumes (MOD) LV vol d, MOD A4C: 96.6 ml LV vol s, MOD A4C: 41.6 ml LV SV MOD A4C:     96.6 ml RIGHT VENTRICLE RV Basal diam:  4.70 cm RV Mid diam:    3.20 cm LEFT ATRIUM             Index        RIGHT ATRIUM           Index LA diam:        5.40 cm 2.69 cm/m   RA Area:     21.90 cm LA Vol (A2C):   96.0 ml 47.79 ml/m  RA Volume:   78.70 ml  39.18 ml/m LA Vol (A4C):   83.3 ml 41.47 ml/m LA Biplane Vol: 89.9 ml 44.76 ml/m  AORTIC VALVE LVOT Vmax:   119.00 cm/s LVOT Vmean:  76.400 cm/s LVOT VTI:     0.173 m  AORTA Ao Root diam: 3.60 cm Ao Asc diam:  2.80 cm MITRAL VALVE                TRICUSPID VALVE MV Area (PHT): 5.42 cm     TR Peak grad:   48.7 mmHg MV Decel Time: 140 msec     TR Vmax:        349.00 cm/s MR Peak grad: 45.4 mmHg MR Vmax:      337.00 cm/s   SHUNTS MV E velocity: 128.00 cm/s  Systemic VTI:  0.17 m MV A velocity: 98.90 cm/s   Systemic Diam: 2.30 cm MV E/A ratio:  1.29 Kardie Tobb DO Electronically signed by Dub Huntsman DO Signature Date/Time: 02/25/2024/11:47:10 AM    Final    DG CHEST PORT 1 VIEW Result Date: 02/24/2024 CLINICAL DATA:  Chest pain. EXAM: PORTABLE CHEST 1 VIEW COMPARISON:  02/23/2024 and CT chest 10/28/2022. FINDINGS: Trachea is midline. Heart is enlarged. Mild hazy opacification in right hemithorax. Streaky densities in the lung bases. Small bilateral pleural effusions. IMPRESSION: Mild congestive heart failure. Electronically Signed   By: Newell Eke M.D.   On: 02/24/2024 12:57   DG Chest Portable 1 View Result Date: 02/23/2024 EXAM: 1 VIEW(S) XRAY OF THE CHEST 02/23/2024 06:57:16 AM COMPARISON: 01/24/2024 CLINICAL  HISTORY: The patient presents with shortness of breath. Chest radiograph is requested to evaluate for cardiopulmonary causes. FINDINGS: LUNGS AND PLEURA: Mild diffuse interstitial opacities, increased since prior exam. Increased small left pleural effusion. No pneumothorax. HEART AND MEDIASTINUM: Mild cardiomegaly. BONES AND SOFT TISSUES: No acute osseous abnormality. IMPRESSION: 1. Left pleural effusion and interstitial edema concerning for congestive heart failure. Electronically signed by: Waddell Calk MD 02/23/2024 07:39 AM EST RP Workstation: [id number]    Microbiology: Results for orders placed or performed during the hospital encounter of 02/23/24  Resp panel by RT-PCR (RSV, Flu A&B, Covid) Anterior Nasal Swab     Status: None   Collection Time: 02/23/24  6:39 AM   Specimen: Anterior Nasal Swab  Result Value Ref Range Status   SARS  Coronavirus 2 by RT PCR NEGATIVE NEGATIVE Final    Comment: (NOTE) SARS-CoV-2 target nucleic acids are NOT DETECTED.  The SARS-CoV-2 RNA is generally detectable in upper respiratory specimens during the acute phase of infection. The lowest concentration of SARS-CoV-2 viral copies this assay can detect is 138 copies/mL. A negative result does not preclude SARS-Cov-2 infection and should not be used as the sole basis for treatment or other patient management decisions. A negative result may occur with  improper specimen collection/handling, submission of specimen other than nasopharyngeal swab, presence of viral mutation(s) within the areas targeted by this assay, and inadequate number of viral copies(<138 copies/mL). A negative result must be combined with clinical observations, patient history, and epidemiological information. The expected result is Negative.  Fact Sheet for Patients:  bloggercourse.com  Fact Sheet for Healthcare Providers:  seriousbroker.it  This test is no t yet approved or cleared by the [place]  FDA and  has been authorized for detection and/or diagnosis of SARS-CoV-2 by FDA under an Emergency Use Authorization (EUA). This EUA will remain  in effect (meaning this test can be used) for the duration of the COVID-19 declaration under Section 564(b)(1) of the Act, 21 U.S.C.section 360bbb-3(b)(1), unless the authorization is terminated  or revoked sooner.       Influenza A by PCR NEGATIVE NEGATIVE Final   Influenza B by PCR NEGATIVE NEGATIVE Final    Comment: (NOTE) The Xpert Xpress SARS-CoV-2/FLU/RSV plus assay is intended as an aid in the diagnosis of influenza from Nasopharyngeal swab specimens and should not be used as a sole basis for treatment. Nasal washings and aspirates are unacceptable for Xpert Xpress SARS-CoV-2/FLU/RSV testing.  Fact Sheet for  Patients: bloggercourse.com  Fact Sheet for Healthcare Providers: seriousbroker.it  This test is not yet approved or cleared by the [place]  FDA and has been authorized for detection and/or diagnosis of SARS-CoV-2 by FDA under an Emergency Use Authorization (EUA). This EUA will remain in effect (meaning this test can be used) for the duration of the COVID-19 declaration under Section 564(b)(1) of the Act, 21 U.S.C. section 360bbb-3(b)(1), unless the authorization is terminated or revoked.     Resp Syncytial Virus by PCR NEGATIVE NEGATIVE Final    Comment: (NOTE) Fact Sheet for Patients: bloggercourse.com  Fact Sheet for Healthcare Providers: seriousbroker.it  This test is not yet approved or cleared by the [place]  FDA and has been authorized for detection and/or diagnosis of SARS-CoV-2 by FDA under an Emergency Use Authorization (EUA). This EUA will remain in effect (meaning this test can be used) for the duration of the COVID-19 declaration under Section 564(b)(1) of the Act, 21 U.S.C. section 360bbb-3(b)(1), unless the authorization is terminated or revoked.  Performed at  [facility], [address] [place], [place], [place] [address]   MRSA Next Gen by PCR, Nasal     Status: None   Collection Time: 02/23/24  3:31 PM   Specimen: Nasal Mucosa; Nasal Swab  Result Value Ref Range Status   MRSA by PCR Next Gen NOT DETECTED NOT DETECTED Final    Comment: (NOTE) The GeneXpert MRSA Assay (FDA approved for NASAL specimens only), is one component of a comprehensive MRSA colonization surveillance program. It is not intended to diagnose MRSA infection nor to guide or monitor treatment for MRSA infections. Test performance is not FDA approved in patients less than 75 years old. Performed at [facility], [address], [place], [place] [address]      Labs: CBC: Recent Labs  Lab 02/23/24 [id number] 02/24/24 1006 02/25/24 0150 02/26/24 0517 02/27/24 0310  WBC 6.4 6.5 5.6 5.9 6.6  NEUTROABS 5.0  --   --   --  5.5  HGB 9.2* 8.4* 7.5* 7.6* 7.2*  HCT 28.6* 26.3* 23.4* 24.3* 23.3*  MCV 89.4 90.1 91.1 91.4 93.2  PLT 187 189 143* 145* 126*   Basic Metabolic Panel: Recent Labs  Lab 02/23/24 0639 02/24/24 1006 02/25/24 0150 02/26/24 0517 02/27/24 0310  NA 141 139 139 137 136  K 3.7 3.3* 3.4* 4.1 4.2  CL 108 111 111 109 107  CO2 18* 21* 16* 22 22  GLUCOSE 115* 138* 99 103* 103*  BUN 40* 39* 35* 37* 39*  CREATININE 4.71* 4.71* 4.59* 4.60* 4.36*  CALCIUM  7.0* 6.6* 6.5* 7.3* 7.4*  MG  --   --  1.8  --  1.9  PHOS  --   --   --   --  4.0   Liver Function Tests: Recent Labs  Lab 02/27/24 0310  AST 17  ALT 24  ALKPHOS 101  BILITOT 0.8  PROT 5.9*  ALBUMIN 3.0*   CBG: No results for input(s): GLUCAP in the last 168 hours.  Discharge time spent: greater than 30 minutes.  Signed: Sabas GORMAN Brod, MD [facility] 02/27/2024

## 2024-02-27 NOTE — TOC Transition Note (Signed)
 Transition of Care [facility]) - Discharge Note   Patient Details  Name: Mark Hayden MRN: [id number] Date of Birth: [birth year]  [facility] CM/SW Contact:  Roxie KANDICE Stain, RN Phone Number: 02/27/2024, 9:57 AM   Clinical Narrative:    Patient stable for discharge.  Resources given by CSW.  Follow up apts on AVS.   Final next level of care: Home/Self Care Barriers to Discharge: Barriers Resolved   Patient Goals and CMS Choice Patient states their goals for this hospitalization and ongoing recovery are:: patient will return home          Discharge Placement             Home          Discharge Plan and Services Additional resources added to the After Visit Summary for   In-house Referral: Clinical Social Work Discharge Planning Services: CM Consult                                 Social Drivers of Health (SDOH) Interventions SDOH Screenings   Food Insecurity: Food Insecurity Present (02/23/2024)  Housing: High Risk (02/23/2024)  Transportation Needs: Unmet Transportation Needs (02/23/2024)  Utilities: At Risk (02/23/2024)  Alcohol Screen: Low Risk (08/20/2022)  Depression (PHQ2-9): High Risk (06/07/2023)  Financial Resource Strain: High Risk (02/04/2024)  Tobacco Use: Medium Risk (02/26/2024)     Readmission Risk Interventions    02/27/2024    9:57 AM  Readmission Risk Prevention Plan  Transportation Screening Complete  PCP or Specialist Appt within 5-7 Days Complete  Home Care Screening Complete  Medication Review (RN CM) Complete

## 2024-02-28 ENCOUNTER — Ambulatory Visit (INDEPENDENT_AMBULATORY_CARE_PROVIDER_SITE_OTHER): Admitting: Student in an Organized Health Care Education/Training Program

## 2024-02-28 ENCOUNTER — Other Ambulatory Visit (HOSPITAL_COMMUNITY): Payer: Self-pay

## 2024-02-28 ENCOUNTER — Telehealth: Payer: Self-pay | Admitting: *Deleted

## 2024-02-28 ENCOUNTER — Encounter: Payer: Self-pay | Admitting: Student in an Organized Health Care Education/Training Program

## 2024-02-28 VITALS — BP 182/89 | HR 96 | Wt 189.0 lb

## 2024-02-28 DIAGNOSIS — I5032 Chronic diastolic (congestive) heart failure: Secondary | ICD-10-CM

## 2024-02-28 DIAGNOSIS — F331 Major depressive disorder, recurrent, moderate: Secondary | ICD-10-CM | POA: Diagnosis not present

## 2024-02-28 DIAGNOSIS — I10 Essential (primary) hypertension: Secondary | ICD-10-CM | POA: Diagnosis not present

## 2024-02-28 DIAGNOSIS — N184 Chronic kidney disease, stage 4 (severe): Secondary | ICD-10-CM

## 2024-02-28 NOTE — Patient Instructions (Signed)
°  VISIT SUMMARY: Today, you had a follow-up appointment to discuss your recent hospitalization for elevated blood pressure and fluid overload. We reviewed your current health conditions, including hypertension, chronic kidney disease, and your recent surgery for an AV fistula. We also addressed your experiences with panic attacks and feelings of depression.  YOUR PLAN: -CHRONIC SEVERE HYPERTENSION: Hypertension is high blood pressure, which can lead to serious health problems if not managed. Your blood pressure was high today, and it is important to take your medications as prescribed to prevent complications. We provided you with a new medication list and clear instructions to help you adhere to your regimen.  -CHRONIC HEART FAILURE WITH PRESERVED EJECTION FRACTION: This condition means your heart is not pumping as efficiently as it should, but it still pumps a normal amount of blood. Controlling your blood pressure is crucial to prevent further damage to your heart. Continue with your current heart failure management plan.  -ADVANCED CHRONIC KIDNEY DISEASE, PRE-DIALYSIS WITH AV FISTULA: Chronic kidney disease means your kidneys are not working as well as they should. You have an AV fistula in place for future dialysis. It is important to take your medications to delay the need for dialysis and to follow up with your nephrologist and surgeons to ensure the fistula is healing well.  -PANIC DISORDER: Panic disorder involves sudden episodes of intense fear. Your recent panic attacks are unlikely related to your blood pressure medications. It is important to continue taking your medications as prescribed.  -DEPRESSION: Depression is a mood disorder that causes persistent feelings of sadness and loss of interest. We discussed the importance of taking your medications as prescribed to help manage your symptoms.  INSTRUCTIONS: Please follow up with your nephrologist regarding dialysis and transplant  eligibility. Ensure your AV fistula is healing well by following up with your surgeons. Continue taking your blood pressure medications as prescribed and adhere to your heart failure management plan. If you experience any issues or have concerns, please contact our office.

## 2024-02-28 NOTE — Transitions of Care (Post Inpatient/ED Visit) (Signed)
 02/28/2024  Name: Mark Hayden MRN: [id number] DOB: Mar 26, 1975  Today's TOC FU Call Status: Today's TOC FU Call Status:: Successful TOC FU Call Completed TOC FU Call Complete Date: 02/28/24  Patient's Name and Date of Birth confirmed. Name, DOB  Transition Care Management Follow-up Telephone Call Date of Discharge: 02/27/24 Discharge Facility: [facility]) Type of Discharge: Inpatient Admission Primary Inpatient Discharge Diagnosis:: HTN; shortness of breath; surgical placement of AV fistula How have you been since you were released from the hospital?: Better (I am fine now.  Getting ready to go to the doctor appointment with my PCP; I have the resources I need and will keep working with the nurse that calls me regularly.  Not having any problems so far since my release from the hospital) Any questions or concerns?: No  Patient declined full TOC call as he is currently preparing to attend this morning's PCP office visit; confirms he is currently well-established with [facility] longitudinal RN CM with scheduled appointment 03/09/24; confirms has received multiple resources from previously active [facility] BSW- states will keep working on the resources she gave me; care coordination placed to established [facility] team to inform of patient's recent hospitalization  Items Reviewed: Did you receive and understand the discharge instructions provided?: Yes (declined detailed review of AVS: he is getting ready to go to PCP office visit) Medications obtained,verified, and reconciled?: No Medications Not Reviewed Reasons:: Other: (Declined medication review: stated will review medications during PCP office visit this morning; does not verbalize medication concerns) Any new allergies since your discharge?: No Dietary orders reviewed?: No (declined full TOC call- getting ready for PCP office visit this morning) Do you have support at home?: Yes People in Home [RPT]: sibling(s) Name of  Support/Comfort Primary Source: Reports independent in self-care activities; reports temporarily residing with supportive sister- reports friends and family assists as/ if needed/ indicated  Medications Reviewed Today: Medications Reviewed Today     Reviewed by Zeola Brys M, RN (Registered Nurse) on 02/28/24 at [phone]  Med List Status: <None>   Medication Order Taking? Sig Documenting Provider Last Dose Status Informant  amLODipine  (NORVASC ) 10 MG tablet [id number] No Take 1 tablet (10 mg total) by mouth daily. [name] [name], MD 02/18/2024 Active Self, Pharmacy Records  aspirin  EC 81 MG tablet [id number] No Take 1 tablet (81 mg total) by mouth daily. Swallow whole. Lue Elsie BROCKS, MD 02/18/2024 Active Self, Pharmacy Records  atorvastatin  (LIPITOR) 20 MG tablet [id number] No Take 1 tablet (20 mg total) by mouth daily. [name] [name], MD 02/18/2024 Active Self, Pharmacy Records  calcitRIOL  (ROCALTROL ) 0.5 MCG capsule [id number]  Take 1 capsule (0.5 mcg total) by mouth daily. Drusilla Sabas RAMAN, MD  Active   calcium  carbonate (TUMS - DOSED IN MG ELEMENTAL CALCIUM ) 500 MG chewable tablet [id number]  Chew 2 tablets (400 mg of elemental calcium  total) by mouth 2 (two) times daily. Drusilla Sabas RAMAN, MD  Active   dapagliflozin  propanediol (FARXIGA ) 10 MG TABS tablet [id number] No Take 1 tablet (10 mg total) by mouth daily. [name] [name], MD 02/18/2024 Active Self, Pharmacy Records  doxazosin  (CARDURA ) 4 MG tablet [id number] No Take 1 tablet (4 mg total) by mouth daily. [name] [name], MD 02/18/2024 Active Self, Pharmacy Records  ferrous sulfate  325 (65 FE) MG tablet [id number]  Take 1 tablet (325 mg total) by mouth daily with breakfast. Drusilla Sabas RAMAN, MD  Active   hydrALAZINE  (APRESOLINE ) 100 MG tablet [id number] No Take 1 tablet (100 mg total) by  mouth 3 (three) times daily. [name] [name], MD 02/18/2024 Active Self, Pharmacy Records  isosorbide  mononitrate (IMDUR ) 120 MG 24 hr  tablet [id number] No Take 1 tablet (120 mg total) by mouth daily. [name] [name], MD 02/18/2024 Active Self, Pharmacy Records  sertraline  (ZOLOFT ) 50 MG tablet [id number] No Take 1 tablet (50 mg total) by mouth daily. [name] [name], MD 02/18/2024 Active Self, Pharmacy Records  sodium bicarbonate  650 MG tablet [id number]  Take 1 tablet (650 mg total) by mouth 2 (two) times daily. Drusilla Sabas RAMAN, MD  Active   torsemide  (DEMADEX ) 20 MG tablet [id number]  Take 1 tablet (20 mg total) by mouth daily. Drusilla Sabas RAMAN, MD  Active            Home Care and Equipment/Supplies: Were Home Health Services Ordered?: No Any new equipment or medical supplies ordered?: No  Functional Questionnaire: Do you need assistance with bathing/showering or dressing?: No Do you need assistance with meal preparation?: No Do you need assistance with eating?: No Do you have difficulty maintaining continence: No Do you need assistance with getting out of bed/getting out of a chair/moving?: No Do you have difficulty managing or taking your medications?: No  Follow up appointments reviewed: PCP Follow-up appointment confirmed?: Yes (care coordination outreach in real-time with scheduling care guide to successfully change routine scheduled office visit 02/28/24 to hospital follow up PCP appointment) Date of PCP follow-up appointment?: 02/28/24 Follow-up Provider: PCP- Dr. [name] Specialist [facility] Follow-up appointment confirmed?: Yes Date of Specialist follow-up appointment?: 04/02/23 Follow-Up Specialty Provider:: cardiology provider Do you need transportation to your follow-up appointment?: No Do you understand care options if your condition(s) worsen?: Yes-patient verbalized understanding  SDOH Interventions Today    Flowsheet Row Most Recent Value  SDOH Interventions   Food Insecurity Interventions Patient Declined  [declined full TOC call- getting ready to attend PCP office visit]  Housing  Interventions Patient Declined  [declined full TOC call- getting ready to attend PCP office visit: confirmed he has been working with [employer] re: resource needs for housing: [employer] made aware of recent hospitalization as FYI]  Transportation Interventions Patient Declined  [declined full TOC call- getting ready to attend PCP office visit: reports currently uses insurance transportation and friends/ family: confirmed has been working with [employer] on all resource needs]  Utilities Interventions Patient Declined  [declined full TOC call- getting ready to attend PCP office visit]   See TOC assessment tabs for additional assessment/ TOC intervention information  Patient declines need for ongoing/ further [facility] care management/ coordination outreach; confirms he is currently well-established with [facility] longitudinal RN CM with scheduled appointment 03/09/24; confirms has received multiple resources from previously active [employer]- states will keep working on the resources she gave me; declines enrollment in 30-day TOC program- declines taking my direct phone number should needs/ concerns arise post-TOC call: care coordination placed to established [facility] team to inform of patient's recent hospitalization  Pls call/ message for questions,  Beverly Suriano Mckinney Nicholas Trompeter, RN, BSN, Media Planner  Transitions of Care  [facility] - [facility] [phone]: direct office

## 2024-02-28 NOTE — Assessment & Plan Note (Signed)
 Progressive kidney disease with estimated GFR around 15.  He has progressing into advanced end-stage renal disease.  A left arm AV fistula was recently placed in the hospital.  Recovering from that well.  He has follow-up planned with nephrology.  No indications for HD currently, but likely will need to do outpatient HD start in the coming months.

## 2024-02-28 NOTE — Assessment & Plan Note (Signed)
 He was recently hospitalized for elevated blood pressure and fluid overload. Blood pressure was 145/85 mmHg in the hospital and 182/89 mmHg this morning in our office.  It seems that medication adherence is still an issue for him.  He felt apathetic, we talked about the continued need for blood pressure lowering medications even if he is preparing for dialysis in the future.  We talked about how he tolerated the blood pressure medications well in the hospital, and they did work for his blood pressure. I emphasized the importance of medication adherence to prevent cardiovascular complications and improve longevity. Continue blood pressure medications as prescribed.  He reports getting a supply of Imdur , hydralazine , and amlodipine  in the hospital that he brought home, he just has not taken them today because he just woke up.  Provided a new medication list with clear instructions and encouraged adherence to the regimen.

## 2024-02-28 NOTE — Assessment & Plan Note (Signed)
 He has experienced recent panic attacks, which are unlikely related to blood pressure medications. Emphasized medication adherence.  He has a number of negative social determinants of health.  Lost his home recently, currently living with his sister.  Mood is stable currently.  We are treating his anxiety and depression with sertraline , but he has been very inconsistent with using this medication.  He has had support from outpatient and inpatient social work, but he has not followed up with the outpatient resources they have given him.  He is apathetic about his health at times.  Follow-up with me closely in 2 months.  Low risk for self-harm currently.

## 2024-02-28 NOTE — Progress Notes (Signed)
 Established Patient Office Visit  Patient ID: Mark Hayden, male    DOB: 1976/02/21  Age: 48 y.o. MRN: [id number] PCP: [name] [name], MD  Chief Complaint  Patient presents with   Hospitalization Follow-up    Subjective:     HPI  Discussed the use of AI scribe software for clinical note transcription with the patient, who gave verbal consent to proceed.  History of Present Illness Mark Hayden is a 48 year old male with hypertension and chronic kidney disease who presents for follow-up after recent hospitalization for elevated blood pressure and fluid overload.  He was hospitalized for four to five days due to elevated blood pressure and fluid overload. During the hospitalization, he received medications to manage his blood pressure and reduce fluid retention. He was discharged with a set of blood pressure medications. His blood pressure was 145/85 mmHg in the hospital and is 182/89 mmHg today. He has not yet taken his blood pressure medication today.  He experiences episodes of panic attacks, which he associates with taking his medications. The first episode occurred last month, followed by another episode two weeks later. He stopped taking his medications after the second episode due to fear of recurrence. He acknowledges that his living situation and weather conditions contribute to his stress and anxiety. He feels hopeless and needs motivation, particularly regarding his health challenges and potential kidney transplant.  He has chronic kidney disease and recently underwent surgery to place a fistula in preparation for future dialysis. He reports reduced pain and swelling compared to the day after surgery and is able to close his fist. He anticipates starting dialysis in the next three months and is considering moving to [place]  to live with his daughter once the dialysis process is established. He is considering a kidney transplant. He has been in contact  with social workers and case managers for support, although he finds the resources provided to be limited.  He is currently living with his sister, who also has kidney disease and lupus. He is concerned about being a burden to her due to their shared health conditions. 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  No numbness or tingling in hands. He feels somewhat strong, better than the day after surgery. No fluid in lungs today.     Objective:     BP (!) 182/89   Pulse 96   Wt 189 lb (85.7 kg)   SpO2 97%   BMI 27.91 kg/m   Physical Exam  Gen: Well-appearing man Neck: Normal thyroid, no adenopathy, no JVD Hear regular, no murmurt:  Lungs: Unlabored, clear throughout, no crackles or wheezing Ext: Left arm is in a sling from recent AV fistula surgery, hand is warm, no edema, normal capillary refill, no paresthesias, there is no edema in either lower extremity     Assessment & Plan:   Problem List Items Addressed This Visit       High   CKD (chronic kidney disease) stage 4, GFR 15-29 ml/min (HCC) (Chronic)   Progressive kidney disease with estimated GFR around 15.  He has progressing into advanced end-stage renal disease.  A left arm AV fistula was recently placed in the hospital.  Recovering from that well.  He has follow-up planned with nephrology.  No indications for HD currently, but likely will need to do outpatient HD start in the coming months.      Hypertension - Primary (Chronic)   He was recently hospitalized for elevated blood pressure  and fluid overload. Blood pressure was 145/85 mmHg in the hospital and 182/89 mmHg this morning in our office.  It seems that medication adherence is still an issue for him.  He felt apathetic, we talked about the continued need for blood pressure lowering medications even if he is preparing for dialysis in the future.  We talked about how he tolerated the blood pressure medications well in the hospital, and they did work for his  blood pressure. I emphasized the importance of medication adherence to prevent cardiovascular complications and improve longevity. Continue blood pressure medications as prescribed.  He reports getting a supply of Imdur , hydralazine , and amlodipine  in the hospital that he brought home, he just has not taken them today because he just woke up.  Provided a new medication list with clear instructions and encouraged adherence to the regimen.      Chronic heart failure with preserved ejection fraction (HFpEF) (HCC) (Chronic)   Chronic heart failure with preserved ejection fraction is stable.  Looks well compensated and euvolemic today on exam.  Did well in the hospital with diuresis.  I recommended continuing torsemide .  Follow-up with me in 2 months for repeat exam.  One adjustment to hospital medications, I discontinued Farxiga  given very limited benefits in a person with his degree of advanced CKD.      Depression (Chronic)   He has experienced recent panic attacks, which are unlikely related to blood pressure medications. Emphasized medication adherence.  He has a number of negative social determinants of health.  Lost his home recently, currently living with his sister.  Mood is stable currently.  We are treating his anxiety and depression with sertraline , but he has been very inconsistent with using this medication.  He has had support from outpatient and inpatient social work, but he has not followed up with the outpatient resources they have given him.  He is apathetic about his health at times.  Follow-up with me closely in 2 months.  Low risk for self-harm currently.        Return in about 2 months (around 04/30/2024).    Cleatus Debby Specking, MD [facility]

## 2024-02-28 NOTE — Assessment & Plan Note (Signed)
 Chronic heart failure with preserved ejection fraction is stable.  Looks well compensated and euvolemic today on exam.  Did well in the hospital with diuresis.  I recommended continuing torsemide .  Follow-up with me in 2 months for repeat exam.  One adjustment to hospital medications, I discontinued Farxiga  given very limited benefits in a person with his degree of advanced CKD.

## 2024-02-29 LAB — MULTIPLE MYELOMA PANEL, SERUM
Albumin SerPl Elph-Mcnc: 3.1 g/dL (ref 2.9–4.4)
Albumin/Glob SerPl: 1.2 (ref 0.7–1.7)
Alpha 1: 0.3 g/dL (ref 0.0–0.4)
Alpha2 Glob SerPl Elph-Mcnc: 0.5 g/dL (ref 0.4–1.0)
B-Globulin SerPl Elph-Mcnc: 0.8 g/dL (ref 0.7–1.3)
Gamma Glob SerPl Elph-Mcnc: 1.1 g/dL (ref 0.4–1.8)
Globulin, Total: 2.6 g/dL (ref 2.2–3.9)
IgA: 204 mg/dL (ref 90–386)
IgG (Immunoglobin G), Serum: 1136 mg/dL (ref 603–1613)
IgM (Immunoglobulin M), Srm: 56 mg/dL (ref 20–172)
Total Protein ELP: 5.7 g/dL — ABNORMAL LOW (ref 6.0–8.5)

## 2024-03-09 ENCOUNTER — Other Ambulatory Visit

## 2024-03-09 ENCOUNTER — Other Ambulatory Visit (INDEPENDENT_AMBULATORY_CARE_PROVIDER_SITE_OTHER)

## 2024-03-09 DIAGNOSIS — I10 Essential (primary) hypertension: Secondary | ICD-10-CM

## 2024-03-09 NOTE — Patient Outreach (Signed)
 Complex Care Management   Visit Note  03/09/2024  Name:  Mark Hayden MRN: [id number] DOB: [birth year]  Situation: Referral received for Complex Care Management related to Heart Failure, Chronic Kidney Disease, and Hypertension I obtained verbal consent from Patient.  Visit completed with Patient  on the phone  Background:   Past Medical History:  Diagnosis Date   CHF (congestive heart failure) (HCC)    Hypertension    Hypertensive emergency 08/20/2022   Pneumonia    Sleep apnea    Suicidal thoughts 09/08/2019    Assessment: Patient Reported Symptoms:  Cognitive Cognitive Status: No symptoms reported      Neurological Neurological Review of Symptoms: No symptoms reported    HEENT HEENT Symptoms Reported: No symptoms reported      Cardiovascular Cardiovascular Symptoms Reported: Swelling in legs or feet Other Cardiovascular Symptoms: Patient reports mild swelling in bilateral legs.Patient reports he has been trying to do better with taking his medications. States he is trying to get a better routine. Emphasized the importance of checking BP, weight and taking his medications as prescribed. Does patient have uncontrolled Hypertension?: Yes Is patient checking Blood Pressure at home?: No Patient's Recent BP reading at home: Patient checked BP while on the phone 158/96. Patient states he has not had his BP med this morning yet but will take it after we get off the phone. Cardiovascular Management Strategies: Medication therapy, Routine screening Weight: 189 lb (85.7 kg) Cardiovascular Self-Management Outcome: 3 (uncertain)  Respiratory Respiratory Symptoms Reported: No symptoms reported    Endocrine Endocrine Symptoms Reported: No symptoms reported Is patient diabetic?: No    Gastrointestinal Additional Gastrointestinal Details: Reports having BM daily. Last BM was 03/08/24. Reports it was normal      Genitourinary Additional Genitourinary Details: Advanced CKD-  reports urinating about 3 times daily since taking Torsemide . Genitourinary Management Strategies: Medication therapy  Integumentary Integumentary Symptoms Reported: Other Other Integumentary Symptoms: Reports dry skin.    Musculoskeletal Additional Musculoskeletal Details: Reports occasional cramping in bilateral legs at night after working all day. Musculoskeletal Management Strategies: Adequate rest, Routine screening      Psychosocial Psychosocial Symptoms Reported: Anxiety - if selected complete GAD, Depression - if selected complete PHQ 2-9 Additional Psychological Details: Reports anxiety/depression has improved since starting Zoloft . Behavioral Management Strategies: Medication therapy Behavioral Health Self-Management Outcome: 3 (uncertain)        03/09/2024    PHQ2-9 Depression Screening   Little interest or pleasure in doing things Several days (My therapy is video chatting with my daughter and grandson)  Feeling down, depressed, or hopeless Several days  PHQ-2 - Total Score 2  Trouble falling or staying asleep, or sleeping too much More than half the days  Feeling tired or having little energy Nearly every day  Poor appetite or overeating  More than half the days  Feeling bad about yourself - or that you are a failure or have let yourself or your family down    Trouble concentrating on things, such as reading the newspaper or watching television More than half the days  Moving or speaking so slowly that other people could have noticed.  Or the opposite - being so fidgety or restless that you have been moving around a lot more than usual Several days  Thoughts that you would be better off dead, or hurting yourself in some way Not at all  PHQ2-9 Total Score 12  If you checked off any problems, how difficult have these problems made it for you  to do your work, take care of things at home, or get along with other people Somewhat difficult  Depression Interventions/Treatment  Counseling, Medication    Today's Vitals   03/09/24 1040  BP: (!) 158/96  Weight:    Pain Scale: 0-10 Pain Score: 0-No pain  Medications Reviewed Today     Reviewed by Leodis Warren DEL, RN (Registered Nurse) on 03/09/24 at 1033  Med List Status: <None>   Medication Order Taking? Sig Documenting Provider Last Dose Status Informant  amLODipine  (NORVASC ) 10 MG tablet [id number] Yes Take 1 tablet (10 mg total) by mouth daily. [name] [name], MD  Active Self, Pharmacy Records  aspirin  EC 81 MG tablet [id number] Yes Take 1 tablet (81 mg total) by mouth daily. Swallow whole. Lue Elsie BROCKS, MD  Active Self, Pharmacy Records  atorvastatin  (LIPITOR) 20 MG tablet [id number] Yes Take 1 tablet (20 mg total) by mouth daily. [name] [name], MD  Active Self, Pharmacy Records  calcitRIOL  (ROCALTROL ) 0.5 MCG capsule [id number] Yes Take 1 capsule (0.5 mcg total) by mouth daily. Drusilla Sabas RAMAN, MD  Active   calcium  carbonate (TUMS - DOSED IN MG ELEMENTAL CALCIUM ) 500 MG chewable tablet [id number] Yes Chew 2 tablets (400 mg of elemental calcium  total) by mouth 2 (two) times daily. Drusilla Sabas RAMAN, MD  Active   doxazosin  (CARDURA ) 4 MG tablet [id number] Yes Take 1 tablet (4 mg total) by mouth daily. [name] [name], MD  Active Self, Pharmacy Records  ferrous sulfate  325 [phone] FE) MG tablet [id number] Yes Take 1 tablet (325 mg total) by mouth daily with breakfast. Drusilla Sabas RAMAN, MD  Active   hydrALAZINE  (APRESOLINE ) 100 MG tablet [id number] Yes Take 1 tablet (100 mg total) by mouth 3 (three) times daily. [name] [name], MD  Active Self, Pharmacy Records  isosorbide  mononitrate (IMDUR ) 120 MG 24 hr tablet [id number] Yes Take 1 tablet (120 mg total) by mouth daily. [name] [name], MD  Active Self, Pharmacy Records  sertraline  (ZOLOFT ) 50 MG tablet [id number] Yes Take 1 tablet (50 mg total) by mouth daily. [name] [name], MD  Active Self, Pharmacy Records  sodium bicarbonate  650  MG tablet [id number] Yes Take 1 tablet (650 mg total) by mouth 2 (two) times daily. Drusilla Sabas RAMAN, MD  Active   torsemide  (DEMADEX ) 20 MG tablet [id number] Yes Take 1 tablet (20 mg total) by mouth daily. Drusilla Sabas RAMAN, MD  Active             Recommendation:   Continue Current Plan of Care  Follow Up Plan:   Telephone follow up appointment date/time:  04/09/24 at 11 am  Warren Leodis RN CM Population Health-Complex Care Management [facility] [phone]

## 2024-03-09 NOTE — Patient Instructions (Addendum)
 Visit Information  Mark Hayden was given information about Medicaid Managed Care team care coordination services as a part of their [facility] Medicaid benefit.   If you would like to schedule transportation through your [facility] plan, please call the following number at least 2 days in advance of your appointment: [phone].   You can also use the MTM portal or MTM mobile app to manage your rides. Reimbursement for transportation is available through [facility]! For the portal, please go to [email or web address]  Call the [facility] Crisis Line at [phone], at any time, 24 hours a day, 7 days a week. If you are in danger or need immediate medical attention call 911.  Please see education materials related to low sodium diet and managing BP provided by MyChart link.  Care plan and visit instructions communicated with the patient verbally today. Patient agrees to receive a copy in MyChart. Active MyChart status and patient understanding of how to access instructions and care plan via MyChart confirmed with patient.     RN Care Manager will continue to reinforce the importance of taking medications as prescribed, weighing and checking BP daily and keeping a record.  Telephone follow up appointment with Managed Medicaid care management team member scheduled for:04/09/24 at 11 am  Mark Quivers RN CM Population Health-Complex Care Management [facility] [phone]   Following is a copy of your plan of care:   Goals Addressed             This Visit's Progress    [facility] RN Care Plan       Problems:  Chronic Disease Management support and education needs related to CHF and HTN  Goal: Over the next 4 months the Patient will continue to work with RN Care Manager and/or Social Worker to address care management and care coordination needs related to CHF and HTN as evidenced by adherence to care management team scheduled appointments      Interventions:    Heart Failure Interventions: Basic overview and discussion of pathophysiology of Heart Failure reviewed Provided education on low sodium diet Provided education about placing scale on hard, flat surface Advised patient to weigh each morning after emptying bladder Discussed importance of daily weight and advised patient to weigh and record daily Reviewed role of diuretics in prevention of fluid overload and management of heart failure; Discussed the importance of keeping all appointments with provider Provided patient with education about the role of exercise in the management of heart failure Screening for signs and symptoms of depression related to chronic disease state  Emphasized the importance of taking app medications as prescribed Emphasized the importance of checking BP and weight daily and keeping a record- patient reports he will try to do better.  Hypertension Interventions: Last practice recorded BP readings:  BP Readings from Last 3 Encounters:  03/09/24 (!) 158/96  02/28/24 (!) 182/89  02/27/24 (!) 157/90   Most recent eGFR/CrCl:  Lab Results  Component Value Date   EGFR 14 (L) 01/02/2024    No components found for: CRCL  Evaluation of current treatment plan related to hypertension self management and patient's adherence to plan as established by provider Provided education to patient re: stroke prevention, s/s of heart attack and stroke Reviewed medications with patient and discussed importance of compliance Discussed plans with patient for ongoing care management follow up and provided patient with direct contact information for care management team Advised patient, providing education and rationale, to monitor blood pressure daily and record, calling  PCP for findings outside established parameters Provided education on prescribed diet low sodium Discussed complications of poorly controlled blood pressure such as heart disease, stroke, circulatory complications,  vision complications, kidney impairment, sexual dysfunction Screening for signs and symptoms of depression related to chronic disease state   Patient Self-Care Activities:  Attend all scheduled provider appointments Call pharmacy for medication refills 3-7 days in advance of running out of medications Call provider office for new concerns or questions  Perform all self care activities independently  Perform IADL's (shopping, preparing meals, housekeeping, managing finances) independently Take medications as prescribed   Work with the social worker to address care coordination needs and will continue to work with the clinical team to address health care and disease management related needs Work with the pharmacist to address medication management needs and will continue to work with the clinical team to address health care and disease management related needs call office if I gain more than 2 pounds in one day or 5 pounds in one week do ankle pumps when sitting keep legs up while sitting track weight in diary use salt in moderation watch for swelling in feet, ankles and legs every day weigh myself daily follow rescue plan if symptoms flare-up eat more whole grains, fruits and vegetables, lean meats and healthy fats know when to call the doctor:for elevated BP readings and/or changes in weight track symptoms and what helps feel better or worse check blood pressure daily write blood pressure results in a log or diary learn about high blood pressure keep a blood pressure log take blood pressure log to all doctor appointments call doctor for signs and symptoms of high blood pressure keep all doctor appointments take medications for blood pressure exactly as prescribed report new symptoms to your doctor eat more whole grains, fruits and vegetables, lean meats and healthy fats limit salt intake to 1500mg /day  Plan:  Telephone follow up appointment with care management team member  scheduled for:  04/09/24 at 11 am The patient has been provided with contact information for the care management team and has been advised to call with any health related questions or concerns.

## 2024-03-17 ENCOUNTER — Other Ambulatory Visit: Payer: Self-pay

## 2024-03-17 DIAGNOSIS — N186 End stage renal disease: Secondary | ICD-10-CM

## 2024-03-25 ENCOUNTER — Inpatient Hospital Stay (HOSPITAL_BASED_OUTPATIENT_CLINIC_OR_DEPARTMENT_OTHER)
Admission: EM | Admit: 2024-03-25 | Discharge: 2024-03-27 | Disposition: A | Discharge status: Home / Self Care | Attending: Internal Medicine | Admitting: Internal Medicine

## 2024-03-25 ENCOUNTER — Encounter (HOSPITAL_BASED_OUTPATIENT_CLINIC_OR_DEPARTMENT_OTHER): Payer: Self-pay

## 2024-03-25 ENCOUNTER — Other Ambulatory Visit: Payer: Self-pay

## 2024-03-25 ENCOUNTER — Emergency Department (HOSPITAL_BASED_OUTPATIENT_CLINIC_OR_DEPARTMENT_OTHER): Admitting: Radiology

## 2024-03-25 DIAGNOSIS — I10 Essential (primary) hypertension: Secondary | ICD-10-CM | POA: Diagnosis not present

## 2024-03-25 DIAGNOSIS — I5033 Acute on chronic diastolic (congestive) heart failure: Secondary | ICD-10-CM

## 2024-03-25 DIAGNOSIS — G4733 Obstructive sleep apnea (adult) (pediatric): Secondary | ICD-10-CM | POA: Diagnosis not present

## 2024-03-25 DIAGNOSIS — N184 Chronic kidney disease, stage 4 (severe): Secondary | ICD-10-CM | POA: Diagnosis present

## 2024-03-25 DIAGNOSIS — I509 Heart failure, unspecified: Principal | ICD-10-CM

## 2024-03-25 DIAGNOSIS — J189 Pneumonia, unspecified organism: Secondary | ICD-10-CM

## 2024-03-25 DIAGNOSIS — J810 Acute pulmonary edema: Secondary | ICD-10-CM

## 2024-03-25 DIAGNOSIS — F32A Depression, unspecified: Secondary | ICD-10-CM | POA: Diagnosis present

## 2024-03-25 LAB — CBC
HCT: 33.9 % — ABNORMAL LOW (ref 39.0–52.0)
HCT: 28.2 % — ABNORMAL LOW (ref 39.0–52.0)
Hemoglobin: 10.5 g/dL — ABNORMAL LOW (ref 13.0–17.0)
Hemoglobin: 8.8 g/dL — ABNORMAL LOW (ref 13.0–17.0)
MCH: 27.4 pg (ref 26.0–34.0)
MCH: 27.9 pg (ref 26.0–34.0)
MCHC: 31 g/dL (ref 30.0–36.0)
MCHC: 31.2 g/dL (ref 30.0–36.0)
MCV: 88.5 fL (ref 80.0–100.0)
MCV: 89.5 fL (ref 80.0–100.0)
Platelets: 207 K/uL (ref 150–400)
Platelets: 179 K/uL (ref 150–400)
RBC: 3.83 MIL/uL — ABNORMAL LOW (ref 4.22–5.81)
RBC: 3.15 MIL/uL — ABNORMAL LOW (ref 4.22–5.81)
RDW: 16 % — ABNORMAL HIGH (ref 11.5–15.5)
RDW: 16 % — ABNORMAL HIGH (ref 11.5–15.5)
WBC: 7.1 K/uL (ref 4.0–10.5)
WBC: 5.2 K/uL (ref 4.0–10.5)
nRBC: 0 % (ref 0.0–0.2)
nRBC: 0 % (ref 0.0–0.2)

## 2024-03-25 LAB — RESP PANEL BY RT-PCR (RSV, FLU A&B, COVID)  RVPGX2
Influenza A by PCR: NEGATIVE
Influenza B by PCR: NEGATIVE
Resp Syncytial Virus by PCR: NEGATIVE
SARS Coronavirus 2 by RT PCR: NEGATIVE

## 2024-03-25 LAB — BASIC METABOLIC PANEL WITH GFR
Anion gap: 13 (ref 5–15)
BUN: 37 mg/dL — ABNORMAL HIGH (ref 6–20)
CO2: 21 mmol/L — ABNORMAL LOW (ref 22–32)
Calcium: 8.1 mg/dL — ABNORMAL LOW (ref 8.9–10.3)
Chloride: 108 mmol/L (ref 98–111)
Creatinine, Ser: 4.25 mg/dL — ABNORMAL HIGH (ref 0.61–1.24)
GFR, Estimated: 16 mL/min — ABNORMAL LOW
Glucose, Bld: 102 mg/dL — ABNORMAL HIGH (ref 70–99)
Potassium: 4.2 mmol/L (ref 3.5–5.1)
Sodium: 143 mmol/L (ref 135–145)

## 2024-03-25 LAB — CREATININE, SERUM
Creatinine, Ser: 4.25 mg/dL — ABNORMAL HIGH (ref 0.61–1.24)
GFR, Estimated: 16 mL/min — ABNORMAL LOW

## 2024-03-25 LAB — PRO BRAIN NATRIURETIC PEPTIDE: Pro Brain Natriuretic Peptide: 19325 pg/mL — ABNORMAL HIGH

## 2024-03-25 MED ORDER — SODIUM CHLORIDE 0.9% FLUSH: 3.0000 mL | INTRAVENOUS | Status: DC | PRN

## 2024-03-25 MED ORDER — SODIUM CHLORIDE 0.9 % IV SOLN: 250.0000 mL | INTRAVENOUS | Status: AC | PRN

## 2024-03-25 MED ORDER — FUROSEMIDE 10 MG/ML IJ SOLN
40.0000 mg | Freq: Once | INTRAMUSCULAR | Status: AC
  Administered 2024-03-25: 40 mg via INTRAVENOUS
  Filled 2024-03-25: qty 4

## 2024-03-25 MED ORDER — CLONIDINE HCL 0.2 MG PO TABS
0.3000 mg | ORAL_TABLET | Freq: Three times a day (TID) | ORAL | Status: DC
  Administered 2024-03-25 – 2024-03-27 (×5): 0.3 mg via ORAL
  Filled 2024-03-25 (×5): qty 1

## 2024-03-25 MED ORDER — ONDANSETRON HCL 4 MG/2ML IJ SOLN
4.0000 mg | Freq: Once | INTRAMUSCULAR | Status: AC
  Administered 2024-03-25: 4 mg via INTRAVENOUS
  Filled 2024-03-25: qty 2

## 2024-03-25 MED ORDER — ACETAMINOPHEN 325 MG PO TABS
650.0000 mg | ORAL_TABLET | ORAL | Status: DC | PRN
  Administered 2024-03-25: 650 mg via ORAL
  Filled 2024-03-25: qty 2

## 2024-03-25 MED ORDER — FUROSEMIDE 10 MG/ML IJ SOLN
40.0000 mg | Freq: Two times a day (BID) | INTRAMUSCULAR | Status: DC
  Administered 2024-03-26 – 2024-03-27 (×3): 40 mg via INTRAVENOUS
  Filled 2024-03-25 (×3): qty 4

## 2024-03-25 MED ORDER — SODIUM CHLORIDE 0.9% FLUSH
3.0000 mL | Freq: Two times a day (BID) | INTRAVENOUS | Status: DC
  Administered 2024-03-25 – 2024-03-27 (×4): 3 mL via INTRAVENOUS

## 2024-03-25 MED ORDER — AZITHROMYCIN 250 MG PO TABS
500.0000 mg | ORAL_TABLET | Freq: Once | ORAL | Status: AC
  Administered 2024-03-25: 500 mg via ORAL
  Filled 2024-03-25: qty 2

## 2024-03-25 MED ORDER — HEPARIN SODIUM (PORCINE) 5000 UNIT/ML IJ SOLN
5000.0000 [IU] | Freq: Three times a day (TID) | INTRAMUSCULAR | Status: DC
  Administered 2024-03-25 – 2024-03-27 (×5): 5000 [IU] via SUBCUTANEOUS
  Filled 2024-03-25 (×5): qty 1

## 2024-03-25 MED ORDER — NITROGLYCERIN IN D5W 200-5 MCG/ML-% IV SOLN
0.0000 ug/min | INTRAVENOUS | Status: DC
  Administered 2024-03-25: 5 ug/min via INTRAVENOUS
  Filled 2024-03-25: qty 250

## 2024-03-25 MED ORDER — HYDRALAZINE HCL 20 MG/ML IJ SOLN
20.0000 mg | INTRAMUSCULAR | Status: DC | PRN
  Administered 2024-03-25 – 2024-03-26 (×4): 20 mg via INTRAVENOUS
  Filled 2024-03-25 (×4): qty 1

## 2024-03-25 MED ORDER — ONDANSETRON HCL 4 MG/2ML IJ SOLN: 4.0000 mg | Freq: Four times a day (QID) | INTRAMUSCULAR | Status: DC | PRN

## 2024-03-25 MED ORDER — SODIUM CHLORIDE 0.9 % IV SOLN
1.0000 g | Freq: Once | INTRAVENOUS | Status: AC
  Administered 2024-03-25: 1 g via INTRAVENOUS
  Filled 2024-03-25: qty 10

## 2024-03-25 NOTE — ED Triage Notes (Signed)
 Arrives POV. States he feels like he has a lot of fluid on his legs for . Has skipped dieretic 1-2 days. Reports congestion x2 days. Denies fever, CP.

## 2024-03-25 NOTE — H&P (Signed)
 " History and Physical    Patient: Mark Hayden FMW:[id number] DOB: 1975/06/21 DOA: 03/25/2024 DOS: the patient was seen and examined on 03/25/2024 PCP: [name] [name], MD  Patient coming from: Home  Chief Complaint:  Chief Complaint  Patient presents with   Shortness of Breath   HPI: Mark Hayden is a 49 y.o. male with medical history significant of diastolic dysfunction CHF, chronic kidney disease stage IV, obstructive sleep apnea, obstructive sleep apnea who was seen in [facility] with worsening shortness of breath after being out of Lasix  for a few days.  Patient has evidence of fluid overload including worsening lower extremity edema.  Has elevated proBNP up to about 20,000 and worsening creatinine at 4.2.  His baseline is close to 4.2.  Patient has a chest x-ray consistent with bilateral pleural effusion consolidation.  Has been negative for flu COVID and RSV.  Started on Lasix  and empiric antibiotics and being admitted with acute exacerbation of CHF.  Review of Systems: As mentioned in the history of present illness. All other systems reviewed and are negative. Past Medical History:  Diagnosis Date   CHF (congestive heart failure) (HCC)    Hypertension    Hypertensive emergency 08/20/2022   Pneumonia    Sleep apnea    Suicidal thoughts 09/08/2019   Past Surgical History:  Procedure Laterality Date   INSERTION OF ARTERIOVENOUS (AV) ARTEGRAFT ARM Left 02/26/2024   Procedure: LEFT BRACHIOCEPHALIC ARTERIOVENOUS FISTULA CREATION;  Surgeon: Serene Gaile ORN, MD;  Location: [facility] OR;  Service: Vascular;  Laterality: Left;   Social History:  reports that he has quit smoking. He has never used smokeless tobacco. He reports current drug use. Drug: Marijuana. He reports that he does not drink alcohol.  Allergies[1]  Family History  Problem Relation Age of Onset   Hypertension Mother    Diabetes Mother    Heart disease Mother    Hypertension Father     Diabetes Father    Heart disease Sister    Hypertension Sister     Prior to Admission medications  Medication Sig Start Date End Date Taking? Authorizing Provider  amLODipine  (NORVASC ) 10 MG tablet Take 1 tablet (10 mg total) by mouth daily. 01/02/24 04/01/24  [name] [name], MD  aspirin  EC 81 MG tablet Take 1 tablet (81 mg total) by mouth daily. Swallow whole. 09/02/23   Lue Elsie BROCKS, MD  atorvastatin  (LIPITOR) 20 MG tablet Take 1 tablet (20 mg total) by mouth daily. 01/02/24   [name] [name], MD  calcitRIOL  (ROCALTROL ) 0.5 MCG capsule Take 1 capsule (0.5 mcg total) by mouth daily. 02/27/24   Drusilla Sabas RAMAN, MD  calcium  carbonate (TUMS - DOSED IN MG ELEMENTAL CALCIUM ) 500 MG chewable tablet Chew 2 tablets (400 mg of elemental calcium  total) by mouth 2 (two) times daily. 02/27/24   Drusilla Sabas RAMAN, MD  doxazosin  (CARDURA ) 4 MG tablet Take 1 tablet (4 mg total) by mouth daily. 01/02/24   [name] [name], MD  ferrous sulfate  325 (65 FE) MG tablet Take 1 tablet (325 mg total) by mouth daily with breakfast. 02/28/24   Drusilla Sabas RAMAN, MD  hydrALAZINE  (APRESOLINE ) 100 MG tablet Take 1 tablet (100 mg total) by mouth 3 (three) times daily. 01/02/24   [name] [name], MD  isosorbide  mononitrate (IMDUR ) 120 MG 24 hr tablet Take 1 tablet (120 mg total) by mouth daily. 01/02/24   [name] [name], MD  sertraline  (ZOLOFT ) 50 MG tablet Take 1 tablet (50 mg total)  by mouth daily. 02/04/24 02/03/25  [name] [name], MD  sodium bicarbonate  650 MG tablet Take 1 tablet (650 mg total) by mouth 2 (two) times daily. 02/27/24   Drusilla Sabas RAMAN, MD  torsemide  (DEMADEX ) 20 MG tablet Take 1 tablet (20 mg total) by mouth daily. 02/27/24   Drusilla Sabas RAMAN, MD    Physical Exam: Vitals:   03/25/24 1650 03/25/24 1700 03/25/24 1800 03/25/24 1911  BP: (!) 166/99 (!) 161/106  (!) 182/106  Pulse: 93 92  89  Resp: (!) 27 (!) 24  20  Temp:      TempSrc:      SpO2: 93% 95%  97%   Weight:   85.1 kg   Height:   5' 9 (1.753 m)    Constitutional: Chronically ill looking NAD, calm, comfortable Eyes: PERRL, lids and conjunctivae normal ENMT: Mucous membranes are moist. Posterior pharynx clear of any exudate or lesions.Normal dentition.  Neck: normal, supple, no masses, no thyromegaly Respiratory: Coarse breath sound bilaterally, decreased air entry, some basal crackles, no accessory muscle use.  Cardiovascular: Sinus tachycardia, no murmurs / rubs / gallops.  1+ extremity edema. 2+ pedal pulses. No carotid bruits.  Abdomen: no tenderness, no masses palpated. No hepatosplenomegaly. Bowel sounds positive.  Musculoskeletal: Good range of motion, no joint swelling or tenderness, Skin: no rashes, lesions, ulcers. No induration Neurologic: CN 2-12 grossly intact. Sensation intact, DTR normal. Strength 5/5 in all 4.  Psychiatric: Normal judgment and insight. Alert and oriented x 3. Normal mood  Data Reviewed:  Blood pressure is 197/101, pulse 104 respiratory 35 oxygen sat 86% on room air.  Hemoglobin is 8.8, CO2 21 BUN 37 creatinine 4.25 calcium  8.1 glucose 102. proBNP 19,325 acute viral screen is negative for flu A flu B RSV and COVID-19 chest x-ray showed increased density in the left lung base findings are suggestive of pleural effusion and consolidation.  Also hazy opacities in both lungs representing mild edema EKG shows sinus tachycardia with nonspecific T wave changes  Assessment and Plan:  #1 acute exacerbation of heart failure with preserved ejection fraction: Patient has evidence of fluid overload.  Will initiate IV diuresis, continue ARB, strict INO's.  Continue close monitoring.  Patient may require oxygen.  #2 chronic kidney disease stage IV: Patient has been apparently referred to a nephrologist but has not seen one yet.  The CHF may be related.  Will need nephrology follow-up.  Diuresis will monitor  #3 obstructive sleep apnea: Consider CPAP at night.  #4  essential hypertension: Patient has hypertensive urgency.  Was placed on nitroglycerin  drip prior to arrival.  This has been discontinued.  Statin hydralazine  and clonidine  for now.  #5 depression: Will continue home regimen  #6 anemia: Anemia of chronic disease.  Continue with H&H  #7 possible pneumonia: Doubt infection at this point.  Patient was initially placed on antibiotics in the ER.  Will monitor closely.    Advance Care Planning:   Code Status: Full Code   Consults: None but will need nephrology consult prior to discharge  Family Communication: No family at bedside  Severity of Illness: The appropriate patient status for this patient is INPATIENT. Inpatient status is judged to be reasonable and necessary in order to provide the required intensity of service to ensure the patient's safety. The patient's presenting symptoms, physical exam findings, and initial radiographic and laboratory data in the context of their chronic comorbidities is felt to place them at high risk for further clinical deterioration. Furthermore, it  is not anticipated that the patient will be medically stable for discharge from the hospital within 2 midnights of admission.   * I certify that at the point of admission it is my clinical judgment that the patient will require inpatient hospital care spanning beyond 2 midnights from the point of admission due to high intensity of service, high risk for further deterioration and high frequency of surveillance required.*  AuthorBETHA SIM KNOLL, MD 03/25/2024 7:25 PM  For on call review [email or web address].      [1]  Allergies Allergen Reactions   Fish Allergy Anaphylaxis and Itching   Shellfish Allergy Anaphylaxis and Itching    seafood   "

## 2024-03-25 NOTE — ED Notes (Signed)
 Rt checked on pt for SOB and WOB. Pt doing well at this time. No distress note a this time.

## 2024-03-25 NOTE — ED Notes (Signed)
 Respiratory took pt off BiPAP at this time.

## 2024-03-25 NOTE — ED Notes (Signed)
 Rt placed pt on BIPAP/NIV due to increase WOB and fluid. No distress noted at this time.

## 2024-03-25 NOTE — ED Notes (Signed)
 Rt took pt on BIPAP/NIV for brake. No distress note at this time.

## 2024-03-25 NOTE — ED Notes (Signed)
 Called George at [facility] for transport 17:13-TC

## 2024-03-25 NOTE — Plan of Care (Addendum)
 [facility] transfer  Pmh HTN, HFpEF, CKD stage IV, OSA resents with worsening shortness of breath after recently being without furosemide  for last few days.  Noted to have lower extremity edema.  proBNP 19,325 and creatinine 4.2 which appears around patient's baseline.  Chest x-ray showed increased densities in the left lung base concerning for pleural  fluid/consolidation and hazy opacities in both lungs that were thought to represent edema.  Influenza, COVID-19, RSV screening were negative.  Patient had been given Lasix  IV and empiric antibiotics in case of underlying infection.  Accepted to a telemetry bed.

## 2024-03-25 NOTE — ED Provider Notes (Signed)
 " [facility] AT [facility] Provider Note   CSN: [id number] Arrival date & time: 03/25/24  9358     Patient presents with: Shortness of Breath   Mark Hayden is a 49 y.o. male.    Shortness of Breath Associated symptoms: cough      49 year old male with medical history significant for HTN, CHF, hypertensive emergency, CKD presenting to the emergency department with shortness of breath and lower extremity swelling.  The patient states that he missed doses of his home torsemide  on Thursday, Friday and this past Sunday.  He has had a productive cough producing lots of sputum.  He endorses chills, denies any fevers.  [place]  He states that he has had worsening bilateral lower extremity swelling.  He denies any chest pain.  He is uncertain how much weight he has gained but he can tell he has gained weight.  He has had difficulty lying flat.  Prior to Admission medications  Medication Sig Start Date End Date Taking? Authorizing Provider  amLODipine  (NORVASC ) 10 MG tablet Take 1 tablet (10 mg total) by mouth daily. 01/02/24 04/01/24  [name] [name], MD  aspirin  EC 81 MG tablet Take 1 tablet (81 mg total) by mouth daily. Swallow whole. 09/02/23   Lue Elsie BROCKS, MD  atorvastatin  (LIPITOR) 20 MG tablet Take 1 tablet (20 mg total) by mouth daily. 01/02/24   [name] [name], MD  calcitRIOL  (ROCALTROL ) 0.5 MCG capsule Take 1 capsule (0.5 mcg total) by mouth daily. 02/27/24   Drusilla Sabas RAMAN, MD  calcium  carbonate (TUMS - DOSED IN MG ELEMENTAL CALCIUM ) 500 MG chewable tablet Chew 2 tablets (400 mg of elemental calcium  total) by mouth 2 (two) times daily. 02/27/24   Drusilla Sabas RAMAN, MD  doxazosin  (CARDURA ) 4 MG tablet Take 1 tablet (4 mg total) by mouth daily. 01/02/24   [name] [name], MD  ferrous sulfate  325 (65 FE) MG tablet Take 1 tablet (325 mg total) by mouth daily with breakfast. 02/28/24   Drusilla Sabas RAMAN, MD   hydrALAZINE  (APRESOLINE ) 100 MG tablet Take 1 tablet (100 mg total) by mouth 3 (three) times daily. 01/02/24   [name] [name], MD  isosorbide  mononitrate (IMDUR ) 120 MG 24 hr tablet Take 1 tablet (120 mg total) by mouth daily. 01/02/24   [name] [name], MD  sertraline  (ZOLOFT ) 50 MG tablet Take 1 tablet (50 mg total) by mouth daily. 02/04/24 02/03/25  [name] [name], MD  sodium bicarbonate  650 MG tablet Take 1 tablet (650 mg total) by mouth 2 (two) times daily. 02/27/24   Drusilla Sabas RAMAN, MD  torsemide  (DEMADEX ) 20 MG tablet Take 1 tablet (20 mg total) by mouth daily. 02/27/24   Drusilla Sabas RAMAN, MD    Allergies: Fish allergy and Shellfish allergy    Review of Systems  Respiratory:  Positive for cough and shortness of breath.   Cardiovascular:  Positive for leg swelling.  All other systems reviewed and are negative.   Updated Vital Signs BP (!) 197/96   Pulse 93   Temp 98.3 F (36.8 C) (Oral)   Resp (!) 26   SpO2 97%   Physical Exam Vitals and nursing note reviewed.  Constitutional:      General: He is not in acute distress.    Appearance: He is well-developed.  HENT:     Head: Normocephalic and atraumatic.  Eyes:     Conjunctiva/sclera: Conjunctivae normal.  Cardiovascular:  Rate and Rhythm: Normal rate and regular rhythm.     Heart sounds: No murmur heard. Pulmonary:     Effort: Pulmonary effort is normal. No respiratory distress.     Breath sounds: Examination of the left-middle field reveals rales. Examination of the left-lower field reveals rales. Rales present.  Abdominal:     Palpations: Abdomen is soft.     Tenderness: There is no abdominal tenderness.  Musculoskeletal:        General: No swelling.     Cervical back: Neck supple.     Right lower leg: Edema present.     Left lower leg: Edema present.  Skin:    General: Skin is warm and dry.     Capillary Refill: Capillary refill takes less than 2 seconds.  Neurological:     Mental  Status: He is alert.  Psychiatric:        Mood and Affect: Mood normal.     (all labs ordered are listed, but only abnormal results are displayed) Labs Reviewed  BASIC METABOLIC PANEL WITH GFR - Abnormal; Notable for the following components:      Result Value   CO2 21 (*)    Glucose, Bld 102 (*)    BUN 37 (*)    Creatinine, Ser 4.25 (*)    Calcium  8.1 (*)    GFR, Estimated 16 (*)    All other components within normal limits  CBC - Abnormal; Notable for the following components:   RBC 3.83 (*)    Hemoglobin 10.5 (*)    HCT 33.9 (*)    RDW 16.0 (*)    All other components within normal limits  PRO BRAIN NATRIURETIC PEPTIDE - Abnormal; Notable for the following components:   Pro Brain Natriuretic Peptide 19,325.0 (*)    All other components within normal limits  RESP PANEL BY RT-PCR (RSV, FLU A&B, COVID)  [id number]    EKG: EKG Interpretation Date/Time:  Wednesday March 25 2024 06:53:19 EST Ventricular Rate:  101 PR Interval:  158 QRS Duration:  96 QT Interval:  362 QTC Calculation: 470 R Axis:   59  Text Interpretation: Sinus tachycardia Consider left atrial enlargement Abnormal T, consider ischemia, lateral leads Confirmed by Jerrol Agent ([id number]) on 03/25/2024 7:02:32 AM  Radiology: DG Chest 2 View Result Date: 03/25/2024 CLINICAL DATA:  Shortness of breath.  CHF. EXAM: CHEST - 2 VIEW COMPARISON:  02/24/2024 FINDINGS: Increased densities at the left lung base is suggestive for pleural fluid and possibly consolidation. Heart size is mildly enlarged. Hazy opacities in both lungs could represent mild edema. Trachea is midline. Negative for a pneumothorax. No acute bone abnormality. IMPRESSION: 1. Increased densities at the left lung base. Findings are suggestive for pleural fluid and consolidation. 2. Hazy opacities in both lungs could represent mild edema. Electronically Signed   By: Juliene Balder M.D.   On: 03/25/2024 07:40     Procedures   Medications Ordered in the ED   ondansetron  (ZOFRAN ) injection 4 mg (4 mg Intravenous Given 03/25/24 0737)  furosemide  (LASIX ) injection 40 mg (40 mg Intravenous Given 03/25/24 0816)  cefTRIAXone  (ROCEPHIN ) 1 g in sodium chloride  0.9 % 100 mL IVPB (0 g Intravenous Stopped 03/25/24 0945)  azithromycin  (ZITHROMAX ) tablet 500 mg (500 mg Oral Given 03/25/24 0840)    Clinical Course as of 03/25/24 1027  Wed Mar 25, 2024  0826 Pro Brain Natriuretic Peptide(!): 19,325.0 [JL]    Clinical Course User Index [JL] Jerrol Agent, MD  Medical Decision Making Amount and/or Complexity of Data Reviewed Labs: ordered. Decision-making details documented in ED Course. Radiology: ordered.  Risk Prescription drug management. Decision regarding hospitalization.    49 year old male with medical history significant for HTN, CHF, hypertensive emergency, CKD presenting to the emergency department with shortness of breath and lower extremity swelling.  The patient states that he missed doses of his home torsemide  on Thursday, Friday and this past Sunday.  He has had a productive cough producing lots of sputum.  He endorses chills, denies any fevers.  [place]  He states that he has had worsening bilateral lower extremity swelling.  He denies any chest pain.  He is uncertain how much weight he has gained but he can tell he has gained weight.  He has had difficulty lying flat.  On arrival, the patient was afebrile, tachycardic heart rate 100, tachypneic RR 26, hypertensive BP would not 195/101, saturating 98% on room air.  Patient presents with concern for CHF exacerbation.  Additionally considered pneumonia, viral infection.  Patient on exam had 2+ pitting edema bilaterally.  He had rales appreciated.  EKG: Sinus tachycardia, ventricular rate 101, nonspecific T wave changes, no STEMI.  Labs: BMP with a stable serum creatinine for the patient's CKD at 4.25, CBC with a mild anemia to 10.5, no  leukocytosis 7.1, COVID flu and RSV PCR testing negative, BNP elevated to 19,000.  CXR: IMPRESSION:  1. Increased densities at the left lung base. Findings are  suggestive for pleural fluid and consolidation.  2. Hazy opacities in both lungs could represent mild edema.    In the setting of the above findings, the patient was covered for developing community-acquired pneumonia and was also administered IV Lasix  40 mg for likely CHF exacerbation given finding of developing pulmonary edema.  Hospitalist medicine was consulted for admission, Dr. Maximino Sharps accepting.  Patient was without an oxygen requirement, persistently tachypneic in the high 20s on assessment at time of admission.       Final diagnoses:  Acute on chronic congestive heart failure, unspecified heart failure type (HCC)  Community acquired pneumonia, unspecified laterality  Acute pulmonary edema [facility])    ED Discharge Orders     None          Jerrol Agent, MD 03/25/24 1027  "

## 2024-03-26 DIAGNOSIS — I5041 Acute combined systolic (congestive) and diastolic (congestive) heart failure: Secondary | ICD-10-CM | POA: Diagnosis not present

## 2024-03-26 DIAGNOSIS — I5033 Acute on chronic diastolic (congestive) heart failure: Secondary | ICD-10-CM | POA: Diagnosis not present

## 2024-03-26 LAB — BASIC METABOLIC PANEL WITH GFR
Anion gap: 10 (ref 5–15)
BUN: 39 mg/dL — ABNORMAL HIGH (ref 6–20)
CO2: 22 mmol/L (ref 22–32)
Calcium: 7.2 mg/dL — ABNORMAL LOW (ref 8.9–10.3)
Chloride: 110 mmol/L (ref 98–111)
Creatinine, Ser: 4.34 mg/dL — ABNORMAL HIGH (ref 0.61–1.24)
GFR, Estimated: 16 mL/min — ABNORMAL LOW
Glucose, Bld: 94 mg/dL (ref 70–99)
Potassium: 3.7 mmol/L (ref 3.5–5.1)
Sodium: 141 mmol/L (ref 135–145)

## 2024-03-26 NOTE — Plan of Care (Signed)

## 2024-03-26 NOTE — Discharge Instructions (Signed)

## 2024-03-26 NOTE — Progress Notes (Signed)
 Heart Failure Navigator Progress Note  Assessed for Heart & Vascular TOC clinic readiness.  Patient does not meet criteria due to he is an Advanced Heart Failure Team patient. .   Navigator will sign off at this time.   Stephane Haddock, BSN, Scientist, Clinical (histocompatibility And Immunogenetics) Only

## 2024-03-26 NOTE — Plan of Care (Signed)
   Problem: Education: Goal: Knowledge of General Education information will improve Description: Including pain rating scale, medication(s)/side effects and non-pharmacologic comfort measures Outcome: Progressing   Problem: Activity: Goal: Risk for activity intolerance will decrease Outcome: Progressing   Problem: Nutrition: Goal: Adequate nutrition will be maintained Outcome: Progressing

## 2024-03-26 NOTE — TOC CM/SW Note (Signed)
 Transition of Care [facility]) - Inpatient Brief Assessment   Patient Details  Name: Mark Hayden MRN: [id number] Date of Birth: 1976/01/01  Transition of Care [facility]) CM/SW Contact:    Mark Hayden, LCSWA Phone Number: 03/26/2024, 3:51 PM   Clinical Narrative: CSW reviewed patients SDOH needs and met patient at bedside to discuss. Patient stated that he is couch surfing between his sister and neighbors house. Patient stated that it is more convenient for him to stay with his neighbor as he works and his neighbor lets him borrow his car. Patient stated his ultimate plan is to eventually move to [place]  to live with his daughter. Patient stated his main issue at the moment is transportation. CSW inquired if patient utilizes the bus system, patient stated no. Patient stated he is saving up for a car but does not know if he will buy one since he plans to move with his daughter.   Patient inquired with CSW about kidney transplant list. CSW will notify medical team of patients inquiry.   Transition of Care Asessment: Insurance and Status: Insurance coverage has been reviewed Patient has primary care physician: Yes Home environment has been reviewed: couch surfing between neighbor and sister Prior level of function:: Independent Prior/Current Home Services: No current home services Social Drivers of Health Review: SDOH reviewed interventions complete Readmission risk has been reviewed: Yes

## 2024-03-26 NOTE — Progress Notes (Addendum)
 " Progress Note   Patient: Mark Hayden FMW:[id number] DOB: 1976/01/04 DOA: 03/25/2024     1 DOS: the patient was seen and examined on 03/26/2024   Brief hospital course: Mark Hayden is a 49 y.o. male with medical history significant of diastolic dysfunction CHF, chronic kidney disease stage IV, obstructive sleep apnea, obstructive sleep apnea who was seen in [facility] with worsening shortness of breath after being out of Lasix  for a few days.  Patient has evidence of fluid overload including worsening lower extremity edema.  Has elevated proBNP up to about 20,000 and worsening creatinine at 4.2.  His baseline is close to 4.2.  Patient has a chest x-ray consistent with bilateral pleural effusion consolidation.  Has been negative for flu COVID and RSV.  Started on Lasix  and empiric antibiotics and being admitted with acute exacerbation of CHF.   Assessment and Plan:    #1 acute exacerbation of heart failure with preserved ejection fraction:  Continue IV Lasix  Monitor input and output Daily weighing We will add GDMT as blood pressure allows   #2 chronic kidney disease stage IV:  Patient has been apparently referred to a nephrologist but has not seen one yet.  The CHF may be related.  Will need nephrology follow-up.  Diuresis will monitor   #3 obstructive sleep apnea: Consider CPAP at night.   #4 essential hypertension:  Continue current antihypertensives   #5 depression: Will continue home regimen   #6 anemia: Anemia of chronic disease.  Continue with H&H   #7 possible pneumonia:-Ruled out      Advance Care Planning:   Code Status: Full Code      Family Communication: No family at bedside   Subjective:  Patient seen and examined at bedside this morning Admits to improvement in respiratory function Diuresing very well  Physical Exam:  Constitutional: Young male laying in bed Neck: normal, supple, no masses, no thyromegaly Respiratory: Creased air  entry bibasilarly Cardiovascular: Sinus tachycardia, no murmurs / rubs / gallops.  1+ extremity edema. 2+ pedal pulses. No carotid bruits.  Abdomen: no tenderness, no masses palpated. No hepatosplenomegaly. Bowel sounds positive.  Musculoskeletal: Good range of motion, no joint swelling or tenderness, Skin: no rashes, lesions, ulcers. No induration Neurologic: CN 2-12 grossly intact. Sensation intact, DTR normal. Strength 5/5 in all 4.  Psychiatric: Normal judgment and insight. Alert and oriented x 3. Normal mood   Data Reviewed:    Latest Ref Rng & Units 03/25/2024    7:44 PM 03/25/2024    7:01 AM 02/27/2024    3:10 AM  CBC  WBC 4.0 - 10.5 K/uL 5.2  7.1  6.6   Hemoglobin 13.0 - 17.0 g/dL 8.8  89.4  7.2   Hematocrit 39.0 - 52.0 % 28.2  33.9  23.3   Platelets 150 - 400 K/uL 179  207  126        Latest Ref Rng & Units 03/26/2024    3:33 AM 03/25/2024    7:44 PM 03/25/2024    7:01 AM  BMP  Glucose 70 - 99 mg/dL 94   897   BUN 6 - 20 mg/dL 39   37   Creatinine 9.38 - 1.24 mg/dL 5.65  5.74  5.74   Sodium 135 - 145 mmol/L 141   143   Potassium 3.5 - 5.1 mmol/L 3.7   4.2   Chloride 98 - 111 mmol/L 110   108   CO2 22 - 32 mmol/L 22   21  Calcium  8.9 - 10.3 mg/dL 7.2   8.1     Vitals:   03/26/24 0733 03/26/24 0753 03/26/24 0906 03/26/24 1119  BP: (!) 175/96 (!) 175/96 (!) 150/85 134/85  Pulse: 93  91 75  Resp: 18     Temp: 98.3 F (36.8 C)   97.6 F (36.4 C)  TempSrc: Oral     SpO2: 99%  98% 97%  Weight:      Height:        Author: Drue ONEIDA Potter, MD 03/26/2024 3:43 PM  For on call review [email or web address].  "

## 2024-03-27 DIAGNOSIS — I5041 Acute combined systolic (congestive) and diastolic (congestive) heart failure: Secondary | ICD-10-CM | POA: Diagnosis not present

## 2024-03-27 LAB — CBC
HCT: 25.7 % — ABNORMAL LOW (ref 39.0–52.0)
Hemoglobin: 8.1 g/dL — ABNORMAL LOW (ref 13.0–17.0)
MCH: 28 pg (ref 26.0–34.0)
MCHC: 31.5 g/dL (ref 30.0–36.0)
MCV: 88.9 fL (ref 80.0–100.0)
Platelets: 149 K/uL — ABNORMAL LOW (ref 150–400)
RBC: 2.89 MIL/uL — ABNORMAL LOW (ref 4.22–5.81)
RDW: 16.3 % — ABNORMAL HIGH (ref 11.5–15.5)
WBC: 3.9 K/uL — ABNORMAL LOW (ref 4.0–10.5)
nRBC: 0 % (ref 0.0–0.2)

## 2024-03-27 LAB — BASIC METABOLIC PANEL WITH GFR
Anion gap: 9 (ref 5–15)
BUN: 39 mg/dL — ABNORMAL HIGH (ref 6–20)
CO2: 22 mmol/L (ref 22–32)
Calcium: 7.2 mg/dL — ABNORMAL LOW (ref 8.9–10.3)
Chloride: 107 mmol/L (ref 98–111)
Creatinine, Ser: 4.37 mg/dL — ABNORMAL HIGH (ref 0.61–1.24)
GFR, Estimated: 16 mL/min — ABNORMAL LOW
Glucose, Bld: 104 mg/dL — ABNORMAL HIGH (ref 70–99)
Potassium: 3.8 mmol/L (ref 3.5–5.1)
Sodium: 138 mmol/L (ref 135–145)

## 2024-03-27 NOTE — Plan of Care (Signed)
   Problem: Education: Goal: Knowledge of General Education information will improve Description: Including pain rating scale, medication(s)/side effects and non-pharmacologic comfort measures Outcome: Progressing   Problem: Health Behavior/Discharge Planning: Goal: Ability to manage health-related needs will improve Outcome: Progressing   Problem: Nutrition: Goal: Adequate nutrition will be maintained Outcome: Progressing

## 2024-03-27 NOTE — Progress Notes (Signed)
 DISCHARGE NOTE HOME Mark Hayden to be discharged Home per MD order. Discussed prescriptions and follow up appointments with the patient. Prescriptions given to patient; medication list explained in detail. Patient verbalized understanding.  Skin clean, dry and intact without evidence of skin break down, no evidence of skin tears noted. IV catheter discontinued intact. Site without signs and symptoms of complications. Dressing and pressure applied. Pt denies pain at the site currently. No complaints noted.  Discharging with fistula Patient free of lines, drains, and wounds.   An After Visit Summary (AVS) was printed and given to the patient. Patient escorted via wheelchair, and discharged home via private auto.  [name] SHAUNNA Pepper, RN

## 2024-03-27 NOTE — Discharge Summary (Signed)
 " Physician Discharge Summary   Patient: Mark Hayden MRN: [id number] DOB: Jul 28, 1975  Admit date:     03/25/2024  Discharge date: 03/27/2024  Discharge Physician: Drue ONEIDA Potter   PCP: Mark [name], MD   Recommendations at discharge:  Follow-up with nephrology and PCP  Discharge Diagnoses: Principal Problem:   Acute on chronic heart failure (HCC) Active Problems:   CKD (chronic kidney disease) stage 4, GFR 15-29 ml/min (HCC)   Hypertension   OSA (obstructive sleep apnea)   Depression   Acute on chronic congestive heart failure (HCC)   Acute exacerbation of CHF (congestive heart failure) (HCC)  Resolved Problems:   * No resolved hospital problems. *  Hospital Course:  Mark Hayden is a 49 y.o. male with medical history significant of diastolic dysfunction CHF, chronic kidney disease stage IV, obstructive sleep apnea, obstructive sleep apnea who was seen in [facility] with worsening shortness of breath after being out of Lasix  for a few days.  Patient has evidence of fluid overload including worsening lower extremity edema.  Has elevated proBNP up to about 20,000 and worsening creatinine at 4.2.  His baseline is close to 4.2.  Patient has a chest x-ray consistent with bilateral pleural effusion consolidation.  Has been negative for flu COVID and RSV.  Started on Lasix  and empiric antibiotics and being admitted with acute exacerbation of CHF.    Assessment and Plan:     #1 acute exacerbation of heart failure with preserved ejection fraction:  Currently euvolemic after receiving IV Lasix  Monitor input and output Daily weighing Cleared for discharge today to follow-up as an outpatient   #2 chronic kidney disease stage IV:  Patient has been apparently referred to a nephrologist but has not seen one yet.  The CHF may be related.  Will need nephrology follow-up.  Diuresis will monitor   #3 obstructive sleep apnea: Consider CPAP at night.   #4  essential hypertension:  Continue current antihypertensives   #5 depression: Will continue home regimen   #6 anemia: Anemia of chronic disease.  Continue with H&H   #7 possible pneumonia:-Ruled out    Consultants: None Procedures performed: None Disposition: Home Diet recommendation:  Renal diet DISCHARGE MEDICATION: Allergies as of 03/27/2024       Reactions   Fish Allergy Anaphylaxis, Itching   Shellfish Allergy Anaphylaxis, Itching   seafood        Medication List     TAKE these medications    amLODipine  10 MG tablet Commonly known as: NORVASC  Take 1 tablet (10 mg total) by mouth daily.   aspirin  EC 81 MG tablet Take 1 tablet (81 mg total) by mouth daily. Swallow whole.   atorvastatin  20 MG tablet Commonly known as: LIPITOR Take 1 tablet (20 mg total) by mouth daily.   calcitRIOL  0.5 MCG capsule Commonly known as: ROCALTROL  Take 1 capsule (0.5 mcg total) by mouth daily.   Calcium  Antacid 500 MG chewable tablet Generic drug: calcium  carbonate Chew 2 tablets (400 mg of elemental calcium  total) by mouth 2 (two) times daily.   doxazosin  4 MG tablet Commonly known as: CARDURA  Take 1 tablet (4 mg total) by mouth daily.   FeroSul 325 (65 Fe) MG tablet Generic drug: ferrous sulfate  Take 1 tablet (325 mg total) by mouth daily with breakfast.   hydrALAZINE  100 MG tablet Commonly known as: APRESOLINE  Take 1 tablet (100 mg total) by mouth 3 (three) times daily.   isosorbide  mononitrate 120 MG 24 hr tablet Commonly known  as: IMDUR  Take 1 tablet (120 mg total) by mouth daily.   sertraline  50 MG tablet Commonly known as: Zoloft  Take 1 tablet (50 mg total) by mouth daily.   sodium bicarbonate  650 MG tablet Take 1 tablet (650 mg total) by mouth 2 (two) times daily.   torsemide  20 MG tablet Commonly known as: DEMADEX  Take 1 tablet (20 mg total) by mouth daily.        Follow-up Information     Mark [name], MD Follow up.   Specialty: Internal  Medicine Why: Please follow up in a week. Contact information: [id number] [place] [address] [phone]                Discharge Exam: Filed Weights   03/25/24 1800 03/26/24 0354 03/27/24 0456  Weight: 85.1 kg 84.4 kg 85.2 kg   Constitutional: Middle-age male in no acute distress Respiratory: Clear to auscultation bilaterally Cardiovascular: Sinus tachycardia, no murmurs / rubs / gallops.  1+ extremity edema. 2+ pedal pulses. No carotid bruits.  Abdomen: no tenderness, no masses palpated. No hepatosplenomegaly. Bowel sounds positive.  Musculoskeletal: Good range of motion, no joint swelling or tenderness, Skin: no rashes, lesions, ulcers. No induration Neurologic: CN 2-12 grossly intact. Sensation intact, DTR normal. Strength 5/5 in all 4.  Psychiatric: Normal judgment and insight. Alert and oriented x 3. Normal mood    Condition at discharge: good  The results of significant diagnostics from this hospitalization (including imaging, microbiology, ancillary and laboratory) are listed below for reference.   Imaging Studies: DG Chest 2 View Result Date: 03/25/2024 CLINICAL DATA:  Shortness of breath.  CHF. EXAM: CHEST - 2 VIEW COMPARISON:  02/24/2024 FINDINGS: Increased densities at the left lung base is suggestive for pleural fluid and possibly consolidation. Heart size is mildly enlarged. Hazy opacities in both lungs could represent mild edema. Trachea is midline. Negative for a pneumothorax. No acute bone abnormality. IMPRESSION: 1. Increased densities at the left lung base. Findings are suggestive for pleural fluid and consolidation. 2. Hazy opacities in both lungs could represent mild edema. Electronically Signed   By: Juliene Balder M.D.   On: 03/25/2024 07:40   VAS US  UPPER EXT VEIN MAPPING (PRE-OP  AVF) Result Date: 02/27/2024 UPPER EXTREMITY VEIN MAPPING Patient Name:  Mark Hayden  Date of Exam:   02/27/2024 Medical Rec #: [id number]              Accession  #:    [id number] Date of Birth: 26-May-1975             Patient Gender: M Patient Age:   68 years Exam Location:  [facility] Procedure:      VAS US  UPPER EXT VEIN MAPPING (PRE-OP  AVF) Referring Phys: ALMARIE UPTON --------------------------------------------------------------------------------  Indications: CKD stage 5. Comparison Study: No prior exam. Performing Technologist: Edilia Elden Appl  Examination Guidelines: A complete evaluation includes B-mode imaging, spectral Doppler, color Doppler, and power Doppler as needed of all accessible portions of each vessel. Bilateral testing is considered an integral part of a complete examination. Limited examinations for reoccurring indications may be performed as noted. +-----------------+-------------+----------+---------+ Right Cephalic   Diameter (cm)Depth (cm)Findings  +-----------------+-------------+----------+---------+ Shoulder             0.44        0.63             +-----------------+-------------+----------+---------+ Prox upper arm       0.29        0.48             +-----------------+-------------+----------+---------+  Mid upper arm        0.29        0.35             +-----------------+-------------+----------+---------+ Dist upper arm       0.27        0.39             +-----------------+-------------+----------+---------+ Antecubital fossa    0.23        0.14             +-----------------+-------------+----------+---------+ Prox forearm         0.18        0.40             +-----------------+-------------+----------+---------+ Mid forearm          0.22        0.21   branching +-----------------+-------------+----------+---------+ Dist forearm         0.17        0.20             +-----------------+-------------+----------+---------+ Wrist                0.22        0.20   Thrombus  +-----------------+-------------+----------+---------+  +-----------------+-------------+----------+--------------+ Right Basilic    Diameter (cm)Depth (cm)   Findings    +-----------------+-------------+----------+--------------+ Mid upper arm        0.46                              +-----------------+-------------+----------+--------------+ Dist upper arm       0.37                              +-----------------+-------------+----------+--------------+ Antecubital fossa    0.20                              +-----------------+-------------+----------+--------------+ Prox forearm         0.18                              +-----------------+-------------+----------+--------------+ Mid forearm          0.18                              +-----------------+-------------+----------+--------------+ Distal forearm       0.12                              +-----------------+-------------+----------+--------------+ Elbow                0.21                 branching    +-----------------+-------------+----------+--------------+ Wrist                                   not visualized +-----------------+-------------+----------+--------------+ 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 +-----------------+-------------+----------+--------------+ Left Cephalic    Diameter (cm)Depth (cm)   Findings    +-----------------+-------------+----------+--------------+ Shoulder             0.36        0.32                  +-----------------+-------------+----------+--------------+  Prox upper arm       0.33        0.26                  +-----------------+-------------+----------+--------------+ Mid upper arm        0.37        0.42                  +-----------------+-------------+----------+--------------+ Dist upper arm       0.35        0.74                  +-----------------+-------------+----------+--------------+ Antecubital fossa                       not visualized  +-----------------+-------------+----------+--------------+ Prox forearm         0.22        0.79                  +-----------------+-------------+----------+--------------+ Mid forearm          0.19        0.33                  +-----------------+-------------+----------+--------------+ Dist forearm         0.20        0.18     branching    +-----------------+-------------+----------+--------------+ Wrist                0.19        0.20                  +-----------------+-------------+----------+--------------+ +-----------------+-------------+----------+--------------+ Left Basilic     Diameter (cm)Depth (cm)   Findings    +-----------------+-------------+----------+--------------+ Mid upper arm        0.42                              +-----------------+-------------+----------+--------------+ Dist upper arm       0.50                              +-----------------+-------------+----------+--------------+ Antecubital fossa                       not visualized +-----------------+-------------+----------+--------------+ Prox forearm         0.24                              +-----------------+-------------+----------+--------------+ Mid forearm          0.26                              +-----------------+-------------+----------+--------------+ Distal forearm       0.19                              +-----------------+-------------+----------+--------------+ Wrist                0.16                              +-----------------+-------------+----------+--------------+ Left cephalic vein is not completely compressible in the upper arm due to resistance caused by the fistula, not thrombus. Hematoma vs post-op  swelling noted in the antecubital area. *See table(s) above for measurements and observations.  Diagnosing physician: Debby Robertson Electronically signed by Debby Robertson on 02/27/2024 at 10:22:43 PM.    Final     Microbiology: Results for  orders placed or performed during the hospital encounter of 03/25/24  Resp panel by RT-PCR (RSV, Flu A&B, Covid) Anterior Nasal Swab     Status: None   Collection Time: 03/25/24  8:22 AM   Specimen: Anterior Nasal Swab  Result Value Ref Range Status   SARS Coronavirus 2 by RT PCR NEGATIVE NEGATIVE Final    Comment: (NOTE) SARS-CoV-2 target nucleic acids are NOT DETECTED.  The SARS-CoV-2 RNA is generally detectable in upper respiratory specimens during the acute phase of infection. The lowest concentration of SARS-CoV-2 viral copies this assay can detect is 138 copies/mL. A negative result does not preclude SARS-Cov-2 infection and should not be used as the sole basis for treatment or other patient management decisions. A negative result may occur with  improper specimen collection/handling, submission of specimen other than nasopharyngeal swab, presence of viral mutation(s) within the areas targeted by this assay, and inadequate number of viral copies(<138 copies/mL). A negative result must be combined with clinical observations, patient history, and epidemiological information. The expected result is Negative.  Fact Sheet for Patients:  bloggercourse.com  Fact Sheet for Healthcare Providers:  seriousbroker.it  This test is no t yet approved or cleared by the [place]  FDA and  has been authorized for detection and/or diagnosis of SARS-CoV-2 by FDA under an Emergency Use Authorization (EUA). This EUA will remain  in effect (meaning this test can be used) for the duration of the COVID-19 declaration under Section 564(b)(1) of the Act, 21 U.S.C.section 360bbb-3(b)(1), unless the authorization is terminated  or revoked sooner.       Influenza A by PCR NEGATIVE NEGATIVE Final   Influenza B by PCR NEGATIVE NEGATIVE Final    Comment: (NOTE) The Xpert Xpress SARS-CoV-2/FLU/RSV plus assay is intended as an aid in the diagnosis of  influenza from Nasopharyngeal swab specimens and should not be used as a sole basis for treatment. Nasal washings and aspirates are unacceptable for Xpert Xpress SARS-CoV-2/FLU/RSV testing.  Fact Sheet for Patients: bloggercourse.com  Fact Sheet for Healthcare Providers: seriousbroker.it  This test is not yet approved or cleared by the [place]  FDA and has been authorized for detection and/or diagnosis of SARS-CoV-2 by FDA under an Emergency Use Authorization (EUA). This EUA will remain in effect (meaning this test can be used) for the duration of the COVID-19 declaration under Section 564(b)(1) of the Act, 21 U.S.C. section 360bbb-3(b)(1), unless the authorization is terminated or revoked.     Resp Syncytial Virus by PCR NEGATIVE NEGATIVE Final    Comment: (NOTE) Fact Sheet for Patients: bloggercourse.com  Fact Sheet for Healthcare Providers: seriousbroker.it  This test is not yet approved or cleared by the [place]  FDA and has been authorized for detection and/or diagnosis of SARS-CoV-2 by FDA under an Emergency Use Authorization (EUA). This EUA will remain in effect (meaning this test can be used) for the duration of the COVID-19 declaration under Section 564(b)(1) of the Act, 21 U.S.C. section 360bbb-3(b)(1), unless the authorization is terminated or revoked.  Performed at [facility], [address] [place], [place] [address]     Labs: CBC: Recent Labs  Lab 03/25/24 0701 03/25/24 1944 03/27/24 0325  WBC 7.1 5.2 3.9*  HGB 10.5* 8.8* 8.1*  HCT 33.9* 28.2* 25.7*  MCV 88.5 89.5 88.9  PLT 207 179 149*   Basic Metabolic Panel: Recent Labs  Lab 03/25/24 0701 03/25/24 1944 03/26/24 0333 03/27/24 0325  NA 143  --  141 138  K 4.2  --  3.7 3.8  CL 108  --  110 107  CO2 21*  --  22 22  GLUCOSE 102*  --  94 104*  BUN 37*  --  39*  39*  CREATININE 4.25* 4.25* 4.34* 4.37*  CALCIUM  8.1*  --  7.2* 7.2*   Liver Function Tests: No results for input(s): AST, ALT, ALKPHOS, BILITOT, PROT, ALBUMIN in the last 168 hours. CBG: No results for input(s): GLUCAP in the last 168 hours.  Discharge time spent:  38 minutes.  Signed: Drue ONEIDA Potter, MD [facility] 03/27/2024 "

## 2024-03-31 NOTE — Progress Notes (Unsigned)
 "  Cardiology Office Note    Date:  03/31/2024  ID:  Mark Hayden, DOB [birth year], MRN [id number] PCP:  [name] [name], MD  Cardiologist:  None  Electrophysiologist:  None   Chief Complaint: ***  History of Present Illness: .   Mark Hayden is a 49 y.o. male with visit-pertinent history of uncontrolled hypertension, chronic HFpEF, CKD.  Patient with history of uncontrolled hypertension since at least 2023, he has had multiple hospital admissions for hypertensive emergency, often in setting of dietary indiscretions and medication noncompliance.  Patient was previously evaluated by advanced heart failure clinic.  Patient was seen in 08/2023 during admission for hypertension and elevated troponin, patient reported sharp chest pain centrally at that time, presentation felt secondary to demand ischemia with a blood pressure of 249/149 upon presentation, troponins flat around 274.  He also has known severe LVH, EF overall has been preserved, prior plan was for outpatient cardiac MRI however patient was not seen in the outpatient setting.  Patient was seen by cardiology service on 02/24/2024 for chest pain, elevated troponins and CHF.  On arrival patient's blood pressure was as high as 230/150, troponins 123>>124>>106.  BNP 901, given 1 dose of IV Lasix .  Patient had nitro responsive chest pain.  On interview patient reported that he had been having severe anxiety when taking medications and discontinued his blood pressure medications.  Echocardiogram showed EF 55 to 60%, no wall motion abnormalities, no further cardiac workup recommended.  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  On 02/26/2024 patient underwent left brachiocephalic AV fistula creation with Dr. Serene for anticipation of starting dialysis.  Patient was discharged in stable condition on  02/27/2024.  On 03/25/2024 patient presented to [facility] with shortness of breath, had been out of torsemide  for a few days.  Patient noted to have worsening lower extremity edema, proBNP 20,000, creatinine 4.2.  Chest x-ray consistent bilateral pleural effusion consolidation.  Patient was started on Lasix  and antibiotics.  Patient was discharged on 03/27/2024 in stable condition.  Today he presents for follow-up.  He reports that he   HFpEF: Echo in 02/2024 indicated LVEF 55 to 60%, no RWMA, severe concentric LVH, G2 DD, interventricular septum flattened in systole, consistent with right ventricular pressure overload, RV systolic function low normal, RV mildly enlarged, moderately elevated PASP. PYP scan recommended by Dr. Barnetta HTN: Blood pressure today Continue amlodipine  10 mg daily, doxazosin  4 mg daily, hydralazine  100 mg 3 times daily, Imdur  120 mg daily. Patient has previously noted GI symptoms on carvedilol . LVH:  CKD stage IV:  Last creatinine Patient followed by nephrology OSA:   Labwork independently reviewed:   ROS: .   *** denies chest pain, shortness of breath, lower extremity edema, fatigue, palpitations, melena, hematuria, hemoptysis, diaphoresis, weakness, presyncope, syncope, orthopnea, and PND.  All other systems are reviewed and otherwise negative.  Studies Reviewed: SABRA    EKG:  EKG is ordered today, personally reviewed, demonstrating ***     CV Studies: Cardiac studies reviewed are outlined and summarized above. Otherwise please see EMR for full report. Cardiac Studies & Procedures   ______________________________________________________________________________________________     ECHOCARDIOGRAM  ECHOCARDIOGRAM COMPLETE 02/25/2024  Narrative ECHOCARDIOGRAM REPORT    Patient Name:   Mark Hayden Date of Exam: 02/25/2024 Medical Rec #:  [id number]             Height:       69.0  in Accession #:    [id number]            Weight:       187.2  lb Date of Birth:  23-Mar-1975            BSA:          2.009 m Patient Age:    48 years              BP:           168/97 mmHg Patient Gender: M                     HR:           90 bpm. Exam Location:  Inpatient  Procedure: 2D Echo, Cardiac Doppler and Color Doppler (Both Spectral and Color Flow Doppler were utilized during procedure).  Indications:    Elevated Troponin  History:        Patient has prior history of Echocardiogram examinations, most recent 08/29/2023. CHF, Signs/Symptoms:Shortness of Breath; Risk Factors:Hypertension, Former Smoker and Sleep Apnea.  Sonographer:    Juliene Rucks Referring Phys: [id number] SHENG L HALEY  IMPRESSIONS   1. Left ventricular wall with speckled pattern, would benefit from cardiac MR to rule out infiltrative disease . Left ventricular ejection fraction, by estimation, is 55 to 60%. The left ventricle has normal function. The left ventricle has no regional wall motion abnormalities. There is severe concentric left ventricular hypertrophy. Left ventricular diastolic parameters are consistent with Grade II diastolic dysfunction (pseudonormalization). Elevated left ventricular end-diastolic pressure. There is the interventricular septum is flattened in systole, consistent with right ventricular pressure overload. 2. Right ventricular systolic function is low normal. The right ventricular size is mildly enlarged. There is moderately elevated pulmonary artery systolic pressure. 3. Left atrial size was moderately dilated. 4. Right atrial size was moderately dilated. 5. A small pericardial effusion is present. The pericardial effusion is anterior to the right ventricle. 6. Mitral valve apparatus appears to have a thickened mass at the base. This was present in prior echo doe 08/2023 and 12/2022 - suspect this is due to thickened papillary muscle. The mitral valve is normal in structure. Mild mitral valve regurgitation. No evidence of mitral stenosis. 7.  The tricuspid valve is abnormal. Tricuspid valve regurgitation is moderate. 8. The aortic valve is normal in structure. Aortic valve regurgitation is not visualized. Aortic valve sclerosis is present, with no evidence of aortic valve stenosis. 9. There is borderline dilatation of the aortic root, measuring 36 mm.  FINDINGS Left Ventricle: Left ventricular wall with speckled pattern, would benefit from cardiac MR to rule out infiltrative disease. Left ventricular ejection fraction, by estimation, is 55 to 60%. The left ventricle has normal function. The left ventricle has no regional wall motion abnormalities. The left ventricular internal cavity size was normal in size. There is severe concentric left ventricular hypertrophy. The interventricular septum is flattened in systole, consistent with right ventricular pressure overload. Left ventricular diastolic parameters are consistent with Grade II diastolic dysfunction (pseudonormalization). Elevated left ventricular end-diastolic pressure.  Right Ventricle: The right ventricular size is mildly enlarged. No increase in right ventricular wall thickness. Right ventricular systolic function is low normal. There is moderately elevated pulmonary artery systolic pressure. The tricuspid regurgitant velocity is 3.49 m/s, and with an assumed right atrial pressure of 8 mmHg, the estimated right ventricular systolic pressure is 56.7 mmHg.  Left Atrium: Left atrial size was moderately dilated.  Right Atrium: Right atrial size  was moderately dilated.  Pericardium: A small pericardial effusion is present. The pericardial effusion is anterior to the right ventricle.  Mitral Valve: Mitral valve apparatus appears to have a thickened mass at the base. This was present in prior echo doe 08/2023 and 12/2022 - suspect this is due to thickened papillary muscle. The mitral valve is normal in structure. Mild mitral valve regurgitation. No evidence of mitral valve  stenosis.  Tricuspid Valve: The tricuspid valve is abnormal. Tricuspid valve regurgitation is moderate . No evidence of tricuspid stenosis.  Aortic Valve: The aortic valve is normal in structure. Aortic valve regurgitation is not visualized. Aortic valve sclerosis is present, with no evidence of aortic valve stenosis.  Pulmonic Valve: The pulmonic valve was normal in structure. Pulmonic valve regurgitation is not visualized. No evidence of pulmonic stenosis.  Aorta: There is borderline dilatation of the aortic root, measuring 36 mm.  Venous: The inferior vena cava was not well visualized.  IAS/Shunts: No atrial level shunt detected by color flow Doppler.   LEFT VENTRICLE PLAX 2D LVIDd:         4.60 cm     Diastology LVIDs:         3.00 cm     LV e' medial:    6.05 cm/s LV PW:         1.70 cm     LV E/e' medial:  21.2 LV IVS:        1.70 cm     LV e' lateral:   6.05 cm/s LVOT diam:     2.30 cm     LV E/e' lateral: 21.2 LV SV:         72 LV SV Index:   36 LVOT Area:     4.15 cm LV IVRT:       95 msec  LV Volumes (MOD) LV vol d, MOD A4C: 96.6 ml LV vol s, MOD A4C: 41.6 ml LV SV MOD A4C:     96.6 ml  RIGHT VENTRICLE RV Basal diam:  4.70 cm RV Mid diam:    3.20 cm  LEFT ATRIUM             Index        RIGHT ATRIUM           Index LA diam:        5.40 cm 2.69 cm/m   RA Area:     21.90 cm LA Vol (A2C):   96.0 ml 47.79 ml/m  RA Volume:   78.70 ml  39.18 ml/m LA Vol (A4C):   83.3 ml 41.47 ml/m LA Biplane Vol: 89.9 ml 44.76 ml/m AORTIC VALVE LVOT Vmax:   119.00 cm/s LVOT Vmean:  76.400 cm/s LVOT VTI:    0.173 m  AORTA Ao Root diam: 3.60 cm Ao Asc diam:  2.80 cm  MITRAL VALVE                TRICUSPID VALVE MV Area (PHT): 5.42 cm     TR Peak grad:   48.7 mmHg MV Decel Time: 140 msec     TR Vmax:        349.00 cm/s MR Peak grad: 45.4 mmHg MR Vmax:      337.00 cm/s   SHUNTS MV E velocity: 128.00 cm/s  Systemic VTI:  0.17 m MV A velocity: 98.90 cm/s   Systemic Diam:  2.30 cm MV E/A ratio:  1.29  Kardie Tobb DO Electronically signed by Dub Huntsman DO Signature Date/Time: 02/25/2024/11:47:10 AM  Final          ______________________________________________________________________________________________       Current Reported Medications:.    Active Medications[1]  Physical Exam:    VS:  There were no vitals taken for this visit.   Wt Readings from Last 3 Encounters:  03/27/24 187 lb 12.8 oz (85.2 kg)  03/09/24 189 lb (85.7 kg)  02/28/24 189 lb (85.7 kg)    GEN: Well nourished, well developed in no acute distress NECK: No JVD; No carotid bruits CARDIAC: ***RRR, no murmurs, rubs, gallops RESPIRATORY:  Clear to auscultation without rales, wheezing or rhonchi  ABDOMEN: Soft, non-tender, non-distended EXTREMITIES:  No edema; No acute deformity     Asessement and Plan:.     ***     Disposition: F/u with ***  Signed, Zyona Pettaway D Zsofia Prout, NP       [1]  No outpatient medications have been marked as taking for the 04/01/24 encounter (Appointment) with Terri Rorrer D, NP.   "

## 2024-04-01 ENCOUNTER — Ambulatory Visit: Admitting: Cardiology

## 2024-04-01 DIAGNOSIS — I10 Essential (primary) hypertension: Secondary | ICD-10-CM

## 2024-04-01 DIAGNOSIS — N184 Chronic kidney disease, stage 4 (severe): Secondary | ICD-10-CM

## 2024-04-09 ENCOUNTER — Other Ambulatory Visit: Payer: Self-pay

## 2024-04-16 ENCOUNTER — Ambulatory Visit (HOSPITAL_COMMUNITY)

## 2024-04-30 ENCOUNTER — Ambulatory Visit: Admitting: Student in an Organized Health Care Education/Training Program

## 2024-05-14 ENCOUNTER — Ambulatory Visit: Admitting: Cardiology
